# Patient Record
Sex: Female | Born: 1960
Health system: Southern US, Community
[De-identification: ages and names within clinical notes are randomized; demographics above are authoritative.]

## PROBLEM LIST (undated history)

## (undated) DIAGNOSIS — T753XXA Motion sickness, initial encounter: Secondary | ICD-10-CM

## (undated) DIAGNOSIS — H409 Unspecified glaucoma: Secondary | ICD-10-CM

## (undated) DIAGNOSIS — J449 Chronic obstructive pulmonary disease, unspecified: Secondary | ICD-10-CM

## (undated) DIAGNOSIS — J45909 Unspecified asthma, uncomplicated: Secondary | ICD-10-CM

## (undated) DIAGNOSIS — K219 Gastro-esophageal reflux disease without esophagitis: Secondary | ICD-10-CM

## (undated) DIAGNOSIS — F32A Depression, unspecified: Secondary | ICD-10-CM

## (undated) DIAGNOSIS — M1990 Unspecified osteoarthritis, unspecified site: Secondary | ICD-10-CM

## (undated) DIAGNOSIS — I499 Cardiac arrhythmia, unspecified: Secondary | ICD-10-CM

## (undated) DIAGNOSIS — N644 Mastodynia: Secondary | ICD-10-CM

## (undated) DIAGNOSIS — N6452 Nipple discharge: Secondary | ICD-10-CM

## (undated) DIAGNOSIS — R200 Anesthesia of skin: Secondary | ICD-10-CM

## (undated) DIAGNOSIS — M539 Dorsopathy, unspecified: Secondary | ICD-10-CM

## (undated) DIAGNOSIS — J189 Pneumonia, unspecified organism: Secondary | ICD-10-CM

## (undated) DIAGNOSIS — R011 Cardiac murmur, unspecified: Secondary | ICD-10-CM

## (undated) DIAGNOSIS — Z972 Presence of dental prosthetic device (complete) (partial): Secondary | ICD-10-CM

## (undated) DIAGNOSIS — F419 Anxiety disorder, unspecified: Secondary | ICD-10-CM

## (undated) DIAGNOSIS — Z9289 Personal history of other medical treatment: Secondary | ICD-10-CM

## (undated) DIAGNOSIS — I509 Heart failure, unspecified: Secondary | ICD-10-CM

## (undated) DIAGNOSIS — G4730 Sleep apnea, unspecified: Secondary | ICD-10-CM

## (undated) DIAGNOSIS — J410 Simple chronic bronchitis: Secondary | ICD-10-CM

## (undated) DIAGNOSIS — I10 Essential (primary) hypertension: Secondary | ICD-10-CM

## (undated) DIAGNOSIS — K635 Polyp of colon: Secondary | ICD-10-CM

## (undated) DIAGNOSIS — F329 Major depressive disorder, single episode, unspecified: Secondary | ICD-10-CM

## (undated) DIAGNOSIS — R112 Nausea with vomiting, unspecified: Secondary | ICD-10-CM

## (undated) DIAGNOSIS — D649 Anemia, unspecified: Secondary | ICD-10-CM

## (undated) DIAGNOSIS — G629 Polyneuropathy, unspecified: Secondary | ICD-10-CM

## (undated) DIAGNOSIS — Z9889 Other specified postprocedural states: Secondary | ICD-10-CM

## (undated) HISTORY — PX: BELPHAROPTOSIS REPAIR: SHX369

## (undated) HISTORY — PX: SPINE SURGERY: SHX786

## (undated) HISTORY — DX: Polyp of colon: K63.5

## (undated) HISTORY — PX: BACK SURGERY: SHX140

## (undated) HISTORY — PX: CARDIAC CATHETERIZATION: SHX172

## (undated) HISTORY — PX: DILATION AND CURETTAGE OF UTERUS: SHX78

## (undated) HISTORY — PX: TUBAL LIGATION: SHX77

## (undated) HISTORY — DX: Depression, unspecified: F32.A

## (undated) HISTORY — DX: Personal history of other medical treatment: Z92.89

## (undated) HISTORY — PX: ABDOMINAL HYSTERECTOMY: SHX81

## (undated) HISTORY — DX: Anemia, unspecified: D64.9

## (undated) HISTORY — PX: ECTOPIC PREGNANCY SURGERY: SHX613

## (undated) HISTORY — DX: Mastodynia: N64.4

## (undated) HISTORY — DX: Unspecified osteoarthritis, unspecified site: M19.90

## (undated) HISTORY — DX: Major depressive disorder, single episode, unspecified: F32.9

## (undated) HISTORY — DX: Nipple discharge: N64.52

## (undated) HISTORY — DX: Gastro-esophageal reflux disease without esophagitis: K21.9

---

## 2001-06-08 HISTORY — PX: NECK SURGERY: SHX720

## 2007-07-28 ENCOUNTER — Ambulatory Visit: Payer: Self-pay | Admitting: Family Medicine

## 2008-12-20 ENCOUNTER — Emergency Department: Payer: Self-pay | Admitting: Internal Medicine

## 2009-09-11 ENCOUNTER — Emergency Department: Payer: Self-pay | Admitting: Internal Medicine

## 2011-01-26 ENCOUNTER — Emergency Department: Payer: Self-pay | Admitting: Emergency Medicine

## 2012-06-08 HISTORY — PX: ABDOMINAL HYSTERECTOMY: SHX81

## 2012-06-08 HISTORY — PX: TOTAL ABDOMINAL HYSTERECTOMY W/ BILATERAL SALPINGOOPHORECTOMY: SHX83

## 2012-10-19 ENCOUNTER — Ambulatory Visit: Payer: Self-pay | Admitting: Family Medicine

## 2012-11-08 ENCOUNTER — Ambulatory Visit: Payer: Self-pay | Admitting: Family Medicine

## 2012-11-21 ENCOUNTER — Ambulatory Visit: Payer: Self-pay | Admitting: Family Medicine

## 2013-02-08 ENCOUNTER — Ambulatory Visit: Payer: Self-pay | Admitting: Obstetrics and Gynecology

## 2013-02-08 LAB — COMPREHENSIVE METABOLIC PANEL
Albumin: 4.1 g/dL (ref 3.4–5.0)
Alkaline Phosphatase: 53 U/L (ref 50–136)
Anion Gap: 4 — ABNORMAL LOW (ref 7–16)
BUN: 14 mg/dL (ref 7–18)
Calcium, Total: 9.4 mg/dL (ref 8.5–10.1)
Chloride: 106 mmol/L (ref 98–107)
Creatinine: 0.93 mg/dL (ref 0.60–1.30)
EGFR (Non-African Amer.): >60
Glucose: 97 mg/dL (ref 65–99)
Potassium: 4.1 mmol/L (ref 3.5–5.1)
SGOT(AST): 16 U/L (ref 15–37)
SGPT (ALT): 19 U/L (ref 12–78)
Sodium: 138 mmol/L (ref 136–145)
Total Protein: 7.1 g/dL (ref 6.4–8.2)

## 2013-02-08 LAB — CBC
HCT: 43.3 % (ref 35.0–47.0)
MCH: 30.4 pg (ref 26.0–34.0)
MCHC: 34.2 g/dL (ref 32.0–36.0)
Platelet: 223 10*3/uL (ref 150–440)

## 2013-02-14 ENCOUNTER — Ambulatory Visit: Payer: Self-pay | Admitting: Obstetrics and Gynecology

## 2013-02-15 LAB — BASIC METABOLIC PANEL
Anion Gap: 6 — ABNORMAL LOW (ref 7–16)
BUN: 4 mg/dL — ABNORMAL LOW (ref 7–18)
Calcium, Total: 8.4 mg/dL — ABNORMAL LOW (ref 8.5–10.1)
Chloride: 107 mmol/L (ref 98–107)
Co2: 27 mmol/L (ref 21–32)
Creatinine: 0.84 mg/dL (ref 0.60–1.30)
Glucose: 89 mg/dL (ref 65–99)
Sodium: 140 mmol/L (ref 136–145)

## 2013-02-15 LAB — CBC
HCT: 37.1 % (ref 35.0–47.0)
MCH: 30.4 pg (ref 26.0–34.0)
MCHC: 33.9 g/dL (ref 32.0–36.0)
Platelet: 202 10*3/uL (ref 150–440)
WBC: 7.5 10*3/uL (ref 3.6–11.0)

## 2013-02-15 LAB — MAGNESIUM: Magnesium: 1.9 mg/dL

## 2013-02-16 LAB — PATHOLOGY REPORT

## 2013-07-26 ENCOUNTER — Ambulatory Visit: Payer: Self-pay | Admitting: Family Medicine

## 2013-08-02 ENCOUNTER — Ambulatory Visit: Payer: Self-pay

## 2013-08-09 ENCOUNTER — Encounter: Payer: Self-pay | Admitting: General Surgery

## 2013-08-21 ENCOUNTER — Encounter: Payer: Self-pay | Admitting: General Surgery

## 2013-08-21 ENCOUNTER — Ambulatory Visit (INDEPENDENT_AMBULATORY_CARE_PROVIDER_SITE_OTHER): Payer: PRIVATE HEALTH INSURANCE | Admitting: General Surgery

## 2013-08-21 VITALS — BP 130/84 | HR 78 | Resp 12 | Ht 65.0 in | Wt 214.0 lb

## 2013-08-21 DIAGNOSIS — N63 Unspecified lump in unspecified breast: Secondary | ICD-10-CM

## 2013-08-21 NOTE — Patient Instructions (Addendum)
The patient is aware to call back for any questions or concerns. Patient to return as needed. Advised on importance of monthly self exam and yearly mammogram and exam.

## 2013-08-21 NOTE — Progress Notes (Signed)
Patient ID: Hannah Kaiser, female   DOB: [birth year], 53 y.o.   MRN: [id number]  Chief Complaint  Patient presents with  . Other    Abnormal mammogram    HPI Hannah Kaiser is a 53 y.o. female who presents for a breast evaluation.  The most recent mammogram was done on 08/02/13. Patient does not perform regular self breast checks but does get regular mammograms done. She states she has bilateral nipple discharge that has been present for approximately 20 years. She states the right breast is worse than the left. The discharge color is greenish. She denies noticing any lumps or bumps in her breasts. No known family history of breast problems. On recent evaluation by Ms. Quentin Ore patient was noted to have questionable lump at 6 and 9 o'clock areola region right breast.    HPI  Past Medical History  Diagnosis Date  . GERD (gastroesophageal reflux disease)   . Colon polyps   . Arthritis   . History of blood transfusion   . Breast pain   . Nipple discharge   . Depression     Past Surgical History  Procedure Laterality Date  . Abdominal hysterectomy  2014  . Spine surgery  1989, 1998  . Neck surgery  2003    Family History  Problem Relation Age of Onset  . Cancer Brother     melanoma  . Cancer Maternal Grandfather     prostate  . Cancer Maternal Uncle     lung    Social History History  Substance Use Topics  . Smoking status: Current Every Day Smoker -- 1.00 packs/day for 37 years  . Smokeless tobacco: Never Used  . Alcohol Use: No    Allergies  Allergen Reactions  . Erythromycin Rash    Current Outpatient Prescriptions  Medication Sig Dispense Refill  . FLUoxetine HCl (PROZAC PO) Take 80 mg by mouth daily.      . hydrochlorothiazide (HYDRODIURIL) 25 MG tablet Take 25 mg by mouth daily.       No current facility-administered medications for this visit.    Review of Systems Review of Systems  Constitutional: Negative.   Respiratory: Negative.   Cardiovascular:  Negative.     Blood pressure 130/84, pulse 78, resp. rate 12, height 5\' 5"  (1.651 m), weight 214 lb (97.07 kg).  Physical Exam Physical Exam  Constitutional: She is oriented to person, place, and time. She appears well-developed and well-nourished.  Eyes: Conjunctivae are normal. No scleral icterus.  Neck: Neck supple. No thyromegaly present.  Cardiovascular: Normal rate, regular rhythm and normal heart sounds.   No murmur heard. Pulmonary/Chest: Effort normal and breath sounds normal. Right breast exhibits nipple discharge (greenish in color). Right breast exhibits no inverted nipple, no mass, no skin change and no tenderness. Left breast exhibits no inverted nipple, no mass, no nipple discharge, no skin change and no tenderness.  Lymphadenopathy:    She has no cervical adenopathy.    She has no axillary adenopathy.  Neurological: She is alert and oriented to person, place, and time.  Skin: Skin is warm and dry.    Data Reviewed Mammogram and ultrasound both normal.   Assessment    No palpable mass in right breast identified today on exam. Imaging was normal. Discharge is chronic and benign.     Plan    May return to routine yearly mammogram and breast exam.        SANKAR,SEEPLAPUTHUR G 08/22/2013, 6:54 AM

## 2013-08-22 ENCOUNTER — Encounter: Payer: Self-pay | Admitting: General Surgery

## 2013-08-30 ENCOUNTER — Emergency Department: Payer: Self-pay | Admitting: Emergency Medicine

## 2013-08-30 LAB — BASIC METABOLIC PANEL
ANION GAP: 4 — AB (ref 7–16)
BUN: 14 mg/dL (ref 7–18)
CHLORIDE: 105 mmol/L (ref 98–107)
Calcium, Total: 9.1 mg/dL (ref 8.5–10.1)
Co2: 27 mmol/L (ref 21–32)
Creatinine: 0.98 mg/dL (ref 0.60–1.30)
EGFR (African American): >60
EGFR (Non-African Amer.): >60
GLUCOSE: 89 mg/dL (ref 65–99)
Osmolality: 272 (ref 275–301)
Potassium: 3.7 mmol/L (ref 3.5–5.1)
Sodium: 136 mmol/L (ref 136–145)

## 2013-08-30 LAB — CBC
HCT: 44.2 % (ref 35.0–47.0)
HGB: 14.6 g/dL (ref 12.0–16.0)
MCH: 29.8 pg (ref 26.0–34.0)
MCHC: 33.1 g/dL (ref 32.0–36.0)
MCV: 90 fL (ref 80–100)
Platelet: 214 10*3/uL (ref 150–440)
RBC: 4.92 10*6/uL (ref 3.80–5.20)
RDW: 12.7 % (ref 11.5–14.5)
WBC: 6.1 10*3/uL (ref 3.6–11.0)

## 2013-08-30 LAB — TROPONIN I: Troponin-I: <0.02 ng/mL

## 2013-08-31 LAB — HEPATIC FUNCTION PANEL A (ARMC)
ALT: 21 U/L (ref 12–78)
AST: 14 U/L — AB (ref 15–37)
Albumin: 4.2 g/dL (ref 3.4–5.0)
Alkaline Phosphatase: 53 U/L
Bilirubin, Direct: <0.1 mg/dL (ref 0.00–0.20)
Bilirubin,Total: 0.5 mg/dL (ref 0.2–1.0)
Total Protein: 7.5 g/dL (ref 6.4–8.2)

## 2013-08-31 LAB — URINALYSIS, COMPLETE
BACTERIA: NONE SEEN
BLOOD: NEGATIVE
Bilirubin,UR: NEGATIVE
Glucose,UR: NEGATIVE mg/dL (ref 0–75)
Hyaline Cast: 2
KETONE: NEGATIVE
LEUKOCYTE ESTERASE: NEGATIVE
NITRITE: NEGATIVE
Ph: 6 (ref 4.5–8.0)
Protein: NEGATIVE
RBC,UR: <1 /HPF (ref 0–5)
Specific Gravity: 1.013 (ref 1.003–1.030)
Squamous Epithelial: 2
WBC UR: 1 /HPF (ref 0–5)

## 2013-08-31 LAB — LIPASE, BLOOD: Lipase: 168 U/L (ref 73–393)

## 2013-08-31 LAB — TROPONIN I

## 2013-11-21 ENCOUNTER — Ambulatory Visit: Payer: Self-pay

## 2014-04-09 ENCOUNTER — Encounter: Payer: Self-pay | Admitting: General Surgery

## 2014-08-16 ENCOUNTER — Emergency Department: Payer: Self-pay | Admitting: Emergency Medicine

## 2014-09-26 ENCOUNTER — Emergency Department
Admit: 2014-09-26 | Disposition: A | Discharge status: Home / Self Care | Payer: Self-pay | Admitting: Emergency Medicine

## 2014-09-28 NOTE — Op Note (Signed)
PATIENT NAMEOLINDA, Hannah Kaiser MR#:  [id number] DATE OF BIRTH:  1961-01-21  DATE OF PROCEDURE:  02/14/2013  PREOPERATIVE DIAGNOSIS: Abnormal uterine bleeding.   POSTOPERATIVE DIAGNOSIS: Abnormal uterine bleeding.   PROCEDURES: 1.  Robot-assisted total laparoscopic hysterectomy.  2.  Lysis of adhesions.  3.  Cystoscopy.   SURGEON: Prentice Docker, MD   ASSISTANT SURGEON: Malachy Mood, MD  ESTIMATED BLOOD LOSS: 50 mL.  OPERATIVE FLUIDS: 1500 mL crystalloid.   COMPLICATIONS: None.   FINDINGS: 1.  Adhesions of large bowel to the uterus.  2.  Left ovary adherent to left uterine wall.  3.  Omental adhesions to anterior abdominal wall.  4.  Normal appearing uterus without fallopian tubes.  5.  Efflux from bilateral ureteral orifices on cystoscopy.   SPECIMENS: Uterus and cervix.  DRAINS: Foley catheter to urometer to gravity.   CONDITION AT THE END OF PROCEDURE: Stable.   PROCEDURE IN DETAIL: The patient was taken to the operating room where general anesthesia was administered and found to be adequate. She was placed in the dorsal supine lithotomy position with great care taken to secure her shoulders given the need for steep Trendelenburg during the procedure and care to spare any nerve damage from positioning during surgery. She was prepped and draped in the usual sterile fashion. After timeout was called, a sterile speculum was placed in the vagina and a single-tooth tenaculum was used to grasp the anterior lip of the cervix. The uterus was sounded to a depth of 7 cm. A large V-care uterine manipulator with Koh ring device was placed in the uterus. This was after removal of the single-tooth tenaculum. The Koh ring was placed flush against the fornices of the vagina. A Foley catheter was then placed inside the bladder throughout the surgery.   Attention was turned to the abdomen where a left upper quadrant 8 mm incision was made at approximately 2 cm from the costal margin at the mid  clavicular line or Palmer's point. A long 5 mm trocar was advanced under direct camera visualization using an Optiview trocar and entry was gained into the abdomen. Entry was verified using opening pressure and then the abdomen was insufflated with CO2. The camera was reintroduced through the trocar to verify entry. After local anesthetic was administered, an 8 mm infraumbilical incision was made and an 8 mm camera port trocar was placed at the umbilicus and 2 flank left lower quadrant 8 mm trocars were placed under direct intra-abdominal camera visualization. The abdomen and pelvis were surveyed with the above-noted findings.   The robot was then docked to the patient's right side and the robot camera was placed without difficulty along with monopolar scissors through the right lower quadrant port and a bipolar fenestrated forceps through the left lower quadrant port. The Palmer's point port was used as the assistant port. At this point, attention was turned to lysing the adherence of the bowel to the left uterine fundus which was accomplished delicately but without difficulty. Next, the right round ligament was transected using the monopolar scissors and the broad ligament was opened anteriorly to the level of the internal os and a bladder flap was created. Next, the broad ligament was opened posteriorly and the utero-ovarian ligament was identified and cauterized using the bipolar fenestrated forceps and then transected using the scissors to join the transection of the round ligament. Next, the broad ligament was opened to the level of the internal os posteriorly. The uterine artery was then skeletonized and then  cauterized using bipolar electrocautery and transected using the monopolar scissors.   Attention was turned to the left adnexa where the round ligament was then similarly transected and the broad ligament was opened to the level of the internal os. Posteriorly the left ovary was dissected away from  the left uterine sidewall and then the utero-ovarian ligament was identified and bipolar electrode cautery was used to cauterize the vessels and the transection was accomplished using monopolar scissors. The broad ligament was opened to the level of the internal os posteriorly next. The left uterine artery was similarly skeletonized, cauterized and transected, and a bladder flap was created. There was some adherence of bladder to the lower uterine segment that had to be carefully dissected. This required backfilling of the bladder of 120 mL of fluid to ensure the boundaries of the bladder were clear given the difficult distinction with the adhesions that were present. Once the bladder flap was successfully created, the uterus was then liberated by creating a colpotomy and following the Koh rings circumferentially around the base of the uterus. The uterus was then removed through the vagina.   The vaginal cuff was closed using #0 Vicryl on a CT-2 in a running fashion. Hemostasis was then verified, and the pelvis was copiously irrigated and cleared of all fluids. Hemostasis was noted. At this point, the robot was undocked and the abdomen was desufflated of CO2. All port sites were then closed using a subcuticular stitch and the skin was reapproximated using Dermabond.   Cystoscopy was undertaken to verify function of the ureters. Both ureters were visualized and efflux was noted from both ureteral orifices. The bladder was also inspected with no noted deformities or injuries. The Foley catheter was then replaced in the bladder and terminated the procedure.   The patient tolerated the procedure well. Sponge, lap and needle counts were correct x 2. For antibiotic prophylaxis, the patient received 2 grams of Ancef prior to the skin incision and received and additional 1 gram of Ancef during the case given that about 3 hours had expired during the case. For VTE prophylaxis, she was wearing pneumatic compression  stockings which were in place and operating throughout the entire procedure. She was awakened in the operating room and taken to the recovery area in stable condition.  ____________________________ Will Bonnet, MD sdj:sb D: 02/14/2013 14:41:22 ET T: 02/14/2013 15:05:48 ET JOB#: [id number]  cc: Will Bonnet, MD, <Dictator>  Will Bonnet MD ELECTRONICALLY SIGNED 03/28/2013 15:03

## 2014-12-16 ENCOUNTER — Emergency Department: Payer: Self-pay

## 2014-12-16 ENCOUNTER — Emergency Department
Admission: EM | Admit: 2014-12-16 | Discharge: 2014-12-16 | Disposition: A | Discharge status: Home / Self Care | Payer: Self-pay | Attending: Emergency Medicine | Admitting: Emergency Medicine

## 2014-12-16 DIAGNOSIS — Z720 Tobacco use: Secondary | ICD-10-CM | POA: Insufficient documentation

## 2014-12-16 DIAGNOSIS — I10 Essential (primary) hypertension: Secondary | ICD-10-CM | POA: Insufficient documentation

## 2014-12-16 DIAGNOSIS — Z79899 Other long term (current) drug therapy: Secondary | ICD-10-CM | POA: Insufficient documentation

## 2014-12-16 DIAGNOSIS — R609 Edema, unspecified: Secondary | ICD-10-CM

## 2014-12-16 DIAGNOSIS — R062 Wheezing: Secondary | ICD-10-CM | POA: Insufficient documentation

## 2014-12-16 DIAGNOSIS — R600 Localized edema: Secondary | ICD-10-CM | POA: Insufficient documentation

## 2014-12-16 LAB — BASIC METABOLIC PANEL
ANION GAP: 7 (ref 5–15)
BUN: 12 mg/dL (ref 6–20)
CALCIUM: 9.1 mg/dL (ref 8.9–10.3)
CHLORIDE: 105 mmol/L (ref 101–111)
CO2: 27 mmol/L (ref 22–32)
CREATININE: 0.92 mg/dL (ref 0.44–1.00)
GFR calc non Af Amer: >60 mL/min (ref >60)
GLUCOSE: 120 mg/dL — AB (ref 65–99)
POTASSIUM: 3.6 mmol/L (ref 3.5–5.1)
SODIUM: 139 mmol/L (ref 135–145)

## 2014-12-16 LAB — CBC
HCT: 39.5 % (ref 35.0–47.0)
Hemoglobin: 13.1 g/dL (ref 12.0–16.0)
MCH: 30.1 pg (ref 26.0–34.0)
MCHC: 33.2 g/dL (ref 32.0–36.0)
MCV: 90.7 fL (ref 80.0–100.0)
PLATELETS: 196 10*3/uL (ref 150–440)
RBC: 4.36 MIL/uL (ref 3.80–5.20)
RDW: 13.5 % (ref 11.5–14.5)
WBC: 5.5 10*3/uL (ref 3.6–11.0)

## 2014-12-16 LAB — TROPONIN I: Troponin I: <0.03 ng/mL (ref <0.031)

## 2014-12-16 LAB — BRAIN NATRIURETIC PEPTIDE: B Natriuretic Peptide: 40 pg/mL (ref 0.0–100.0)

## 2014-12-16 MED ORDER — IPRATROPIUM-ALBUTEROL 0.5-2.5 (3) MG/3ML IN SOLN
RESPIRATORY_TRACT | Status: AC
  Administered 2014-12-16: 3 mL via RESPIRATORY_TRACT
  Filled 2014-12-16: qty 3

## 2014-12-16 MED ORDER — IPRATROPIUM-ALBUTEROL 0.5-2.5 (3) MG/3ML IN SOLN
3.0000 mL | Freq: Once | RESPIRATORY_TRACT | Status: AC
  Administered 2014-12-16: 3 mL via RESPIRATORY_TRACT

## 2014-12-16 MED ORDER — FUROSEMIDE 40 MG PO TABS: 40.0000 mg | ORAL_TABLET | Freq: Every day | ORAL | Status: DC

## 2014-12-16 MED ORDER — POTASSIUM CHLORIDE CRYS ER 20 MEQ PO TBCR
20.0000 meq | EXTENDED_RELEASE_TABLET | Freq: Once | ORAL | Status: AC
  Administered 2014-12-16: 20 meq via ORAL

## 2014-12-16 MED ORDER — POTASSIUM CHLORIDE CRYS ER 20 MEQ PO TBCR
EXTENDED_RELEASE_TABLET | ORAL | Status: AC
  Administered 2014-12-16: 20 meq via ORAL
  Filled 2014-12-16: qty 1

## 2014-12-16 MED ORDER — HYDROCHLOROTHIAZIDE 25 MG PO TABS
ORAL_TABLET | ORAL | Status: AC
Start: 2014-12-16 — End: 2014-12-16
  Administered 2014-12-16: 25 mg via ORAL
  Filled 2014-12-16: qty 1

## 2014-12-16 MED ORDER — POTASSIUM CHLORIDE ER 10 MEQ PO TBCR: 20.0000 meq | EXTENDED_RELEASE_TABLET | Freq: Every day | ORAL | Status: DC

## 2014-12-16 MED ORDER — FUROSEMIDE 40 MG PO TABS
40.0000 mg | ORAL_TABLET | Freq: Once | ORAL | Status: AC
  Administered 2014-12-16: 40 mg via ORAL

## 2014-12-16 MED ORDER — HYDROCHLOROTHIAZIDE 25 MG PO TABS
25.0000 mg | ORAL_TABLET | Freq: Every day | ORAL | Status: DC
  Administered 2014-12-16: 25 mg via ORAL

## 2014-12-16 MED ORDER — FUROSEMIDE 40 MG PO TABS
ORAL_TABLET | ORAL | Status: AC
Start: 2014-12-16 — End: 2014-12-16
  Administered 2014-12-16: 40 mg via ORAL
  Filled 2014-12-16: qty 1

## 2014-12-16 NOTE — ED Provider Notes (Addendum)
[facility] Emergency Department Provider Note  ____________________________________________  Time seen: Approximately 4:27 PM  I have reviewed the triage vital signs and the nursing notes.   HISTORY  Chief Complaint Leg Swelling and Shortness of Breath    HPI Hannah Kaiser is a 54 y.o. female history of hypertension, depression. She presents today stating that she has no sick increased swelling in both legs over the last 1 month. In particular she reports that she's gained about 9 pounds in the last week. She does report that she has swelling that started in her ankles and now worked its way up to about the level of her knees and is becoming uncomfortable feeling a "stretched" feeling in both legs. She denies any chest pain. She does report that she has some mild shortness of breath, but this is not uncommon for her given her smoking history. She states she is also supposed to be on blood pressure medicine including atenolol and hydrochlorothiazide, but she does not take these on a regular basis and has not taken them recently.  She denies any fevers or productive cough. No abdominal pain or swelling.   Past Medical History  Diagnosis Date  . GERD (gastroesophageal reflux disease)   . Colon polyps   . Arthritis   . History of blood transfusion   . Breast pain   . Nipple discharge   . Depression     There are no active problems to display for this patient.   Past Surgical History  Procedure Laterality Date  . Abdominal hysterectomy  2014  . Spine surgery  1989, 1998  . Neck surgery  2003    Current Outpatient Rx  Name  Route  Sig  Dispense  Refill  . FLUoxetine HCl (PROZAC PO)   Oral   Take 80 mg by mouth daily.         . hydrochlorothiazide (HYDRODIURIL) 25 MG tablet   Oral   Take 25 mg by mouth daily.           Allergies Erythromycin  Family History  Problem Relation Age of Onset  . Cancer Brother     melanoma  . Cancer  Maternal Grandfather     prostate  . Cancer Maternal Uncle     lung    Social History History  Substance Use Topics  . Smoking status: Current Every Day Smoker -- 1.00 packs/day for 37 years  . Smokeless tobacco: Never Used  . Alcohol Use: No    Review of Systems Constitutional: No fever/chills Eyes: No visual changes. ENT: No sore throat. Cardiovascular: Denies chest pain. Respiratory: Denies shortness of breath, except with some exertion. Gastrointestinal: No abdominal pain.  No nausea, no vomiting.  No diarrhea.  No constipation. Genitourinary: Negative for dysuria. Musculoskeletal: Negative for back pain. Skin: Negative for rash, but is a swelling and lower legs. Neurological: Negative for headaches, focal weakness or numbness.  10-point ROS otherwise negative.  ____________________________________________   PHYSICAL EXAM:  VITAL SIGNS: ED Triage Vitals  Enc Vitals Group     BP 12/16/14 1535 201/103 mmHg     Pulse Rate 12/16/14 1535 99     Resp 12/16/14 1535 16     Temp 12/16/14 1535 98.3 F (36.8 C)     Temp Source 12/16/14 1535 Oral     SpO2 12/16/14 1535 99 %     Weight 12/16/14 1535 245 lb (111.131 kg)     Height 12/16/14 1535 5\' 5"  (1.651 m)  Head Cir --      Peak Flow --      Pain Score --      Pain Loc --      Pain Edu? --      Excl. in [place]? --     Constitutional: Alert and oriented. Well appearing and in no acute distress. Eyes: Conjunctivae are normal. PERRL. EOMI. Head: Atraumatic. Nose: No congestion/rhinnorhea. Mouth/Throat: Mucous membranes are moist.  Oropharynx non-erythematous. Neck: No stridor.   Cardiovascular: Normal rate, regular rhythm. Grossly normal heart sounds.  Good peripheral circulation. Respiratory: Normal respiratory effort.  No retractions. Lungs CTAB, except for some occasional end expiratory wheezes which are very scant. Patient speaks in full sentences and is in no respiratory distress. No accessory muscle  use. Gastrointestinal: Soft and nontender. No distention. No abdominal bruits. No CVA tenderness. Musculoskeletal: No lower extremity tenderness. Patient has 2+ lower extremity edema bilaterally. No rash or erythema.  No joint effusions. Neurologic:  Normal speech and language. No gross focal neurologic deficits are appreciated. Speech is normal.  Skin:  Skin is warm, dry and intact. No rash noted. Psychiatric: Mood and affect are normal. Speech and behavior are normal.  ____________________________________________   LABS (all labs ordered are listed, but only abnormal results are displayed)  Labs Reviewed  BASIC METABOLIC PANEL - Abnormal; Notable for the following:    Glucose, Bld 120 (*)    All other components within normal limits  BRAIN NATRIURETIC PEPTIDE  CBC  TROPONIN I   ____________________________________________  EKG  ED ECG REPORT I, QUALE, MARK, the attending physician, personally viewed and interpreted this ECG.  Date: 12/16/2014 EKG Time: 1545 Rate: 85 Rhythm: normal sinus rhythm QRS Axis: normal Intervals: normal ST/T Wave abnormalities: normal Conduction Disutrbances: Inferior Q waves Narrative Interpretation: Normal sinus rhythm. No acute ischemic changes. Q waves noted in inferior distribution. Somewhat low voltage.  Present in March 2015 reveals no acute abnormalities, except for slight decreased voltage noted in lateral precordial leads. ____________________________________________  RADIOLOGY  CXR clear  US Venous Img Lower Bilateral (Final result) Result time: 12/16/14 18:00:37   Final result by Rad Results In Interface (12/16/14 18:00:37)   Narrative:   CLINICAL DATA: Bilateral lower extremity edema for the past 3 weeks with pain.  EXAM: BILATERAL LOWER EXTREMITY VENOUS DOPPLER ULTRASOUND  TECHNIQUE: Gray-scale sonography with graded compression, as well as color Doppler and duplex ultrasound were performed to evaluate the  lower extremity deep venous systems from the level of the common femoral vein and including the common femoral, femoral, profunda femoral, popliteal and calf veins including the posterior tibial, peroneal and gastrocnemius veins when visible. The superficial great saphenous vein was also interrogated. Spectral Doppler was utilized to evaluate flow at rest and with distal augmentation maneuvers in the common femoral, femoral and popliteal veins.  COMPARISON: Report from 03/17/2000  FINDINGS: RIGHT LOWER EXTREMITY  Common Femoral Vein: No evidence of thrombus. Normal compressibility, respiratory phasicity and response to augmentation.  Saphenofemoral Junction: No evidence of thrombus. Normal compressibility and flow on color Doppler imaging.  Profunda Femoral Vein: No evidence of thrombus. Normal compressibility and flow on color Doppler imaging.  Femoral Vein: No evidence of thrombus. Normal compressibility, respiratory phasicity and response to augmentation.  Popliteal Vein: No evidence of thrombus. Normal compressibility, respiratory phasicity and response to augmentation.  Calf Veins: No evidence of thrombus. Normal compressibility and flow on color Doppler imaging.  Superficial Great Saphenous Vein: No evidence of thrombus. Normal compressibility and flow on color Doppler  imaging.  Venous Reflux: None.  Other Findings: None.  LEFT LOWER EXTREMITY  Common Femoral Vein: No evidence of thrombus. Normal compressibility, respiratory phasicity and response to augmentation.  Saphenofemoral Junction: No evidence of thrombus. Normal compressibility and flow on color Doppler imaging.  Profunda Femoral Vein: No evidence of thrombus. Normal compressibility and flow on color Doppler imaging.  Femoral Vein: No evidence of thrombus. Normal compressibility, respiratory phasicity and response to augmentation.  Popliteal Vein: No evidence of thrombus. Normal  compressibility, respiratory phasicity and response to augmentation.  Calf Veins: No evidence of thrombus. Normal compressibility and flow on color Doppler imaging.  Superficial Great Saphenous Vein: No evidence of thrombus. Normal compressibility and flow on color Doppler imaging.  Venous Reflux: None.  Other Findings: Left sided Baker's cyst, 4.8 by 1.9 by 4.8 cm.  IMPRESSION: 1. No evidence of deep venous thrombosis. 2. Moderate-sized left Baker's cyst.    ____________________________________________   PROCEDURES  Procedure(s) performed: None  Critical Care performed: No  ____________________________________________   INITIAL IMPRESSION / ASSESSMENT AND PLAN / ED COURSE  Pertinent labs & imaging results that were available during my care of the patient were reviewed by me and considered in my medical decision making (see chart for details).  Patient presents with increasing lower extremity edema over the last 1 month. This is associated with some mild shortness of breath for the last couple of weeks. She is in no distress. She does indeed have moderate lower extremity edema. She is also quite hypertensive, but is also noncompliant with her medications for hypertension treatment. I believe there is a possibility of an element of heart failure, though she has no evidence of increased work of breathing and her oxygen saturation is normal at this time. Chest x-ray is clear.  We'll start the patient on Lasix, place her back on her normal hydrochlorothiazide and we will add Lasix. I have paged Dr. Chancy Milroy, patient's cardiologist to discuss further treatment recommendations.  Care and disposition assigned Dr. Karma Greaser will follow-up on venous Doppler study and reevaluation of patient's blood pressure prior to discharge to follow-up with her doctor.  ----------------------------------------- 6:05 PM on 12/16/2014 -----------------------------------------  Patient's primary  cardiologist not available at this time, did not answer return page. I have discussed with Dr. Rayann Heman of cardiology regarding the patient's symptomatology, vital signs, clinical presentation and laboratory analysis. Dr. Rayann Heman recommends treatment with Lasix and we can place her on once daily for 5 days. She is to follow-up with Dr. Chancy Milroy by calling his office to set up follow-up on Monday, if unable to reach them and she can follow up with [facility] cardiology and Dr. Rayann Heman.  ----------------------------------------- 6:10 PM on 12/16/2014 -----------------------------------------  Patient's wheezing is improved after a single nebulizer treatment and she is breathing normally. No wheezing. Patient reports she does occasionally use an inhaler for similar symptoms, and I do not believe she is currently exhibiting signs of an acute COPD exacerbation.  I will based on evaluation of the patient is suffering from edema likely some mild volume overload, but she is quite stable hemodynamically without distress. I believe she is appropriate for outpatient follow-up. I did discuss very close return precautions including any increasing trouble breathing, any chest pain, fevers, productive cough. She is quite agreeable with the plan to take Lasix as well as resume her home blood pressure medications.  ----------------------------------------- 6:20 PM on 12/16/2014 -----------------------------------------  Patient's blood pressure improved 181/92. She is asymptomatic at this time and lungs are clear. Duplex is negative  for DVT. No discharge the patient home with a short course of Lasix and potassium. She is to follow-up with Dr. Chancy Milroy Monday, and will call Dr. Jackalyn Lombard office if unable to get follow Dr. Chancy Milroy within the next 1-2 days.   ____________________________________________   FINAL CLINICAL IMPRESSION(S) / ED DIAGNOSES  Final diagnoses:  Edema, peripheral      Delman Kitten, MD 12/16/14  1821  Patient's results and blood pressure have improved. I will discharge the patient myself, Dr. Karma Greaser did not participate in the care of this patient.  Delman Kitten, MD 12/16/14 [phone]

## 2014-12-16 NOTE — Discharge Instructions (Signed)
Peripheral Edema  Please make sure to restart your blood pressure medicines. In addition, call Dr. Marella Bile cardiology office tomorrow morning to set up follow-up within 1-2 days for reevaluation.  It is very important that you return to the emergency room right away should you begin to experience increased shortness of breath, chest pain, trouble breathing, or other new concerns or symptoms arise.  You have swelling in your legs (peripheral edema). This swelling is due to excess accumulation of salt and water in your body. Edema may be a sign of heart, kidney or liver disease, or a side effect of a medication. It may also be due to problems in the leg veins. Elevating your legs and using special support stockings may be very helpful, if the cause of the swelling is due to poor venous circulation. Avoid long periods of standing, whatever the cause. Treatment of edema depends on identifying the cause. Chips, pretzels, pickles and other salty foods should be avoided. Restricting salt in your diet is almost always needed. Water pills (diuretics) are often used to remove the excess salt and water from your body via urine. These medicines prevent the kidney from reabsorbing sodium. This increases urine flow. Diuretic treatment may also result in lowering of potassium levels in your body. Potassium supplements may be needed if you have to use diuretics daily. Daily weights can help you keep track of your progress in clearing your edema. You should call your caregiver for follow up care as recommended. SEEK IMMEDIATE MEDICAL CARE IF:   You have increased swelling, pain, redness, or heat in your legs.  You develop shortness of breath, especially when lying down.  You develop chest or abdominal pain, weakness, or fainting.  You have a fever. Document Released: 07/02/2004 Document Revised: 08/17/2011 Document Reviewed: 06/12/2009 [facility] Patient Information 2015 [place], [place]. This information is not  intended to replace advice given to you by your health care provider. Make sure you discuss any questions you have with your health care provider.

## 2014-12-16 NOTE — ED Notes (Signed)
Pt from home via POV c/o bilateral leg swelling and SOB gradually over last few weeks. States gained 9 pounds over last few days with increase in severity in swelling.

## 2015-02-19 ENCOUNTER — Other Ambulatory Visit: Payer: Self-pay | Admitting: Family Medicine

## 2015-03-13 ENCOUNTER — Encounter: Payer: Self-pay | Admitting: Family Medicine

## 2015-03-13 ENCOUNTER — Ambulatory Visit (INDEPENDENT_AMBULATORY_CARE_PROVIDER_SITE_OTHER): Payer: Self-pay | Admitting: Family Medicine

## 2015-03-13 VITALS — BP 189/109 | HR 73 | Temp 98.9°F | Ht 63.6 in | Wt 244.7 lb

## 2015-03-13 DIAGNOSIS — R8299 Other abnormal findings in urine: Secondary | ICD-10-CM

## 2015-03-13 DIAGNOSIS — R1012 Left upper quadrant pain: Secondary | ICD-10-CM

## 2015-03-13 DIAGNOSIS — I10 Essential (primary) hypertension: Secondary | ICD-10-CM

## 2015-03-13 DIAGNOSIS — I129 Hypertensive chronic kidney disease with stage 1 through stage 4 chronic kidney disease, or unspecified chronic kidney disease: Secondary | ICD-10-CM

## 2015-03-13 DIAGNOSIS — Z23 Encounter for immunization: Secondary | ICD-10-CM

## 2015-03-13 DIAGNOSIS — A048 Other specified bacterial intestinal infections: Secondary | ICD-10-CM

## 2015-03-13 DIAGNOSIS — R82998 Other abnormal findings in urine: Secondary | ICD-10-CM | POA: Insufficient documentation

## 2015-03-13 DIAGNOSIS — Z113 Encounter for screening for infections with a predominantly sexual mode of transmission: Secondary | ICD-10-CM

## 2015-03-13 DIAGNOSIS — B9681 Helicobacter pylori [H. pylori] as the cause of diseases classified elsewhere: Secondary | ICD-10-CM

## 2015-03-13 LAB — UA/M W/RFLX CULTURE, ROUTINE
BILIRUBIN UA: NEGATIVE
GLUCOSE, UA: NEGATIVE
Ketones, UA: NEGATIVE
LEUKOCYTES UA: NEGATIVE
Nitrite, UA: NEGATIVE
PH UA: 7.5 (ref 5.0–7.5)
PROTEIN UA: NEGATIVE
RBC, UA: NEGATIVE
Specific Gravity, UA: 1.015 (ref 1.005–1.030)
Urobilinogen, Ur: 0.2 mg/dL (ref 0.2–1.0)

## 2015-03-13 LAB — MICROALBUMIN, URINE WAIVED
CREATININE, URINE WAIVED: 50 mg/dL (ref 10–300)
MICROALB, UR WAIVED: 10 mg/L (ref 0–19)

## 2015-03-13 MED ORDER — LOSARTAN POTASSIUM 50 MG PO TABS: 50.0000 mg | ORAL_TABLET | Freq: Every day | ORAL | Status: DC

## 2015-03-13 MED ORDER — FLUOXETINE HCL 40 MG PO CAPS: 80.0000 mg | ORAL_CAPSULE | Freq: Every day | ORAL | Status: DC

## 2015-03-13 MED ORDER — ATENOLOL 25 MG PO TABS: 25.0000 mg | ORAL_TABLET | Freq: Every day | ORAL | Status: DC

## 2015-03-13 MED ORDER — HYDROCHLOROTHIAZIDE 25 MG PO TABS: 25.0000 mg | ORAL_TABLET | Freq: Every day | ORAL | Status: DC

## 2015-03-13 NOTE — Assessment & Plan Note (Signed)
Under poor control. Concerned about renal function. Cannot tolerate ACE-inhibitors due to severe cough with post-tussive vomiting. Will start losartan 50mg  and coupon given to patient today. Recheck BP in 6 months. Await blood work.

## 2015-03-13 NOTE — Progress Notes (Signed)
BP 189/109 mmHg  Pulse 73  Temp(Src) 98.9 F (37.2 C)  Ht 5' 3.6" (1.615 m)  Wt 244 lb 11.2 oz (110.995 kg)  BMI 42.56 kg/m2  SpO2 99%   Subjective:    Patient ID: Hannah Kaiser, female    DOB: 05-26-1961, 54 y.o.   MRN: [id number]  HPI: Hannah Kaiser is a 54 y.o. female  Chief Complaint  Patient presents with  . Hypertension   HYPERTENSION Hypertension status: uncontrolled  Satisfied with current treatment? no Duration of hypertension: chronic BP monitoring frequency:  not checking BP range: elevated 180s BP medication side effects:  yes Medication compliance: poor compliance Aspirin: no Recurrent headaches: yes Visual changes: yes Palpitations: yes Dyspnea: yes Chest pain: no Lower extremity edema: yes Dizzy/lightheaded: yes  ABDOMINAL PAIN- L upper quadrant  Duration:week Onset: sudden Severity: severe Quality: sharp and stabbing Location:  LUQ  Episode duration: couple of minutes Radiation: yes into her back Frequency: intermittent Alleviating factors: nothing Aggravating factors: push on it Status: worse Treatments attempted: none Fever: no Nausea: yes Vomiting: no Weight loss: no Decreased appetite: no Diarrhea: yes Constipation: no Blood in stool: no Heartburn: yes Jaundice: no Rash: no Dysuria/urinary frequency: no Hematuria: yes History of sexually transmitted disease: no Recurrent NSAID use: yes  URINARY SYMPTOMS Dysuria: no Urinary frequency: yes Urgency: yes Small volume voids: no Symptom severity: none Urinary incontinence: no Foul odor: yes Hematuria: yes- real dark first thing in the AM Abdominal pain: yes Back pain: no Suprapubic pain/pressure: no Flank pain: no Fever:  no Vomiting: no  Relevant past medical, surgical, family and social history reviewed and updated as indicated. Interim medical history since our last visit reviewed. Allergies and medications reviewed and updated.  Review of Systems  Constitutional:  Negative.   Respiratory: Positive for cough, chest tightness and shortness of breath. Negative for apnea, choking and stridor.   Cardiovascular: Positive for palpitations and leg swelling. Negative for chest pain.  Gastrointestinal: Positive for nausea and abdominal pain. Negative for vomiting, diarrhea, constipation, blood in stool, abdominal distention, anal bleeding and rectal pain.  Genitourinary: Positive for urgency and hematuria. Negative for dysuria, frequency, flank pain, decreased urine volume, vaginal bleeding, vaginal discharge, enuresis, difficulty urinating, genital sores, vaginal pain, menstrual problem, pelvic pain and dyspareunia.  Skin: Negative.   Neurological: Positive for dizziness, light-headedness and numbness. Negative for tremors, seizures, syncope, facial asymmetry, speech difficulty and weakness.    Per HPI unless specifically indicated above     Objective:    BP 189/109 mmHg  Pulse 73  Temp(Src) 98.9 F (37.2 C)  Ht 5' 3.6" (1.615 m)  Wt 244 lb 11.2 oz (110.995 kg)  BMI 42.56 kg/m2  SpO2 99%  Wt Readings from Last 3 Encounters:  03/13/15 244 lb 11.2 oz (110.995 kg)  12/16/14 245 lb (111.131 kg)  08/21/13 214 lb (97.07 kg)    Physical Exam  Constitutional: She is oriented to person, place, and time. She appears well-developed and well-nourished. No distress.  HENT:  Head: Normocephalic and atraumatic.  Right Ear: Hearing normal.  Left Ear: Hearing normal.  Nose: Nose normal.  Eyes: Conjunctivae and lids are normal. Right eye exhibits no discharge. Left eye exhibits no discharge. No scleral icterus.  Cardiovascular: Normal rate, regular rhythm, normal heart sounds and intact distal pulses.  Exam reveals no gallop and no friction rub.   No murmur heard. Pulmonary/Chest: Effort normal and breath sounds normal. No respiratory distress. She has no wheezes. She has no rales. She  exhibits no tenderness.  Abdominal: Soft. Bowel sounds are normal. She exhibits  no distension and no mass. There is tenderness. There is no rebound and no guarding.  LUQ  Musculoskeletal: Normal range of motion. She exhibits edema. She exhibits no tenderness.  2+ edema bilaterally  Neurological: She is alert and oriented to person, place, and time.  Skin: Skin is warm, dry and intact. No rash noted. No erythema. No pallor.  Psychiatric: She has a normal mood and affect. Her speech is normal and behavior is normal. Judgment and thought content normal. Cognition and memory are normal.  Nursing note and vitals reviewed.   Results for orders placed or performed during the hospital encounter of 15/83/09  Basic metabolic panel  Result Value Ref Range   Sodium 139 135 - 145 mmol/L   Potassium 3.6 3.5 - 5.1 mmol/L   Chloride 105 101 - 111 mmol/L   CO2 27 22 - 32 mmol/L   Glucose, Bld 120 (H) 65 - 99 mg/dL   BUN 12 6 - 20 mg/dL   Creatinine, Ser 0.92 0.44 - 1.00 mg/dL   Calcium 9.1 8.9 - 10.3 mg/dL   GFR calc non Af Amer >60 >60 mL/min   GFR calc Af Amer >60 >60 mL/min   Anion gap 7 5 - 15  BNP (order ONLY if patient complains of dyspnea/SOB AND you have documented it for THIS visit)  Result Value Ref Range   B Natriuretic Peptide 40.0 0.0 - 100.0 pg/mL  CBC  Result Value Ref Range   WBC 5.5 3.6 - 11.0 K/uL   RBC 4.36 3.80 - 5.20 MIL/uL   Hemoglobin 13.1 12.0 - 16.0 g/dL   HCT 39.5 35.0 - 47.0 %   MCV 90.7 80.0 - 100.0 fL   MCH 30.1 26.0 - 34.0 pg   MCHC 33.2 32.0 - 36.0 g/dL   RDW 13.5 11.5 - 14.5 %   Platelets 196 150 - 440 K/uL  Troponin I  Result Value Ref Range   Troponin I <0.03 <0.031 ng/mL      Assessment & Plan:   Problem List Items Addressed This Visit      Genitourinary   Benign hypertensive renal disease - Primary    Under poor control. Concerned about renal function. Cannot tolerate ACE-inhibitors due to severe cough with post-tussive vomiting. Will start losartan 50mg  and coupon given to patient today. Recheck BP in 6 months. Await blood  work.         Other   Dark urine    UA negative. Await blood work.       Relevant Orders   Microalbumin, Urine Waived   Comprehensive metabolic panel   UA/M w/rflx Culture, Routine    Other Visit Diagnoses    H. pylori infection        Never treated. Will check to see if cleared and if not treat.     Relevant Orders    Helicobacter Pylori Antibody, IGM    Abdominal pain, left upper quadrant        EKG no change from prior. Concerning for H. pylori. Checking labs today. Await results. Continue to monitor.     Relevant Orders    Amylase    Lipase    Helicobacter Pylori Antibody, IGM    EKG 12-Lead    Routine screening for STI (sexually transmitted infection)        Will check for STI today. Await results.     Relevant Orders    Hepatitis,  Acute    GC/chlamydia probe amp, genital    RPR    HIV antibody    HSV(herpes simplex vrs) 1+2 ab-IgG    Immunization due        Flu shot given today.     Relevant Orders    Flu Vaccine QUAD 36+ mos PF IM (Fluarix & Fluzone Quad PF) (Completed)        Follow up plan: Return in about 2 weeks (around 03/27/2015) for BP check.

## 2015-03-13 NOTE — Addendum Note (Signed)
Addended by: Valerie Roys on: 03/13/2015 01:39 PM   Modules accepted: Miquel Dunn

## 2015-03-13 NOTE — Patient Instructions (Signed)
Chronic Kidney Disease °Chronic kidney disease occurs when the kidneys are damaged over a long period. The kidneys are two organs that lie on either side of the spine between the middle of the back and the front of the abdomen. The kidneys: °· Remove wastes and extra water from the blood. °· Produce important hormones. These help keep bones strong, regulate blood pressure, and help create red blood cells. °· Balance the fluids and chemicals in the blood and tissues. °A small amount of kidney damage may not cause problems, but a large amount of damage may make it difficult or impossible for the kidneys to work the way they should. If steps are not taken to slow down the kidney damage or stop it from getting worse, the kidneys may stop working permanently. Most of the time, chronic kidney disease does not go away. However, it can often be controlled, and those with the disease can usually live normal lives. °CAUSES °The most common causes of chronic kidney disease are diabetes and high blood pressure (hypertension). Chronic kidney disease may also be caused by: °· Diseases that cause the kidneys' filters to become inflamed. °· Diseases that affect the immune system. °· Genetic diseases. °· Medicines that damage the kidneys, such as anti-inflammatory medicines. °· Poisoning or exposure to toxic substances. °· A reoccurring kidney or urinary infection. °· A problem with urine flow. This may be caused by: °· Cancer. °· Kidney stones. °· An enlarged prostate in males. °SIGNS AND SYMPTOMS °Because the kidney damage in chronic kidney disease occurs slowly, symptoms develop slowly and may not be obvious until the kidney damage becomes severe. A person may have a kidney disease for years without showing any symptoms. Symptoms can include: °· Swelling (edema) of the legs, ankles, or feet. °· Tiredness (lethargy). °· Nausea or vomiting. °· Confusion. °· Problems with urination, such as: °· Decreased urine  production. °· Frequent urination, especially at night. °· Frequent accidents in children who are potty trained. °· Muscle twitches and cramps. °· Shortness of breath. °· Weakness. °· Persistent itchiness. °· Loss of appetite. °· Metallic taste in the mouth. °· Trouble sleeping. °· Slowed development in children. °· Short stature in children. °DIAGNOSIS °Chronic kidney disease may be detected and diagnosed by tests, including blood, urine, imaging, or kidney biopsy tests. °TREATMENT °Most chronic kidney diseases cannot be cured. Treatment usually involves relieving symptoms and preventing or slowing the progression of the disease. Treatment may include: °· A special diet. You may need to avoid alcohol and foods that are salty and high in potassium. °· Medicines. These may: °· Lower blood pressure. °· Relieve anemia. °· Relieve swelling. °· Protect the bones. °HOME CARE INSTRUCTIONS °· Follow your prescribed diet.  Your health care provider may instruct you to limit daily salt (sodium) and protein intake. °· Take medicines only as directed by your health care provider. Do not take any new medicines (prescription, over-the-counter, or nutritional supplements) unless approved by your health care provider. Many medicines can worsen your kidney damage or need to have the dose adjusted.   °· Quit smoking if you smoke. Talk to your health care provider about a smoking cessation program. °· Keep all follow-up visits as directed by your health care provider. °· Monitor your blood pressure. °· Start or continue an exercise plan. °· Get immunizations as directed by your health care provider. °· Take vitamin and mineral supplements as directed by your health care provider. °SEEK IMMEDIATE MEDICAL CARE IF: °· Your symptoms get worse or you develop   new symptoms.  You develop symptoms of end-stage kidney disease. These include:  Headaches.  Abnormally dark or light skin.  Numbness in the hands or feet.  Easy  bruising.  Frequent hiccups.  Menstruation stops.  You have a fever.  You have decreased urine production.  You havepain or bleeding when urinating. MAKE SURE YOU:  Understand these instructions.  Will watch your condition.  Will get help right away if you are not doing well or get worse. FOR MORE INFORMATION   American Association of Kidney Patients: BombTimer.gl  National Kidney Foundation: www.kidney.[place]: https://mathis.com/  [facility]: www.lifeoptions.org and www.kidneyschool.org   This information is not intended to replace advice given to you by your health care provider. Make sure you discuss any questions you have with your health care provider.   Document Released: 03/03/2008 Document Revised: 06/15/2014 Document Reviewed: 01/22/2012 Elsevier Interactive Patient Education 2016 [place] DASH stands for "Dietary Approaches to Stop Hypertension." The DASH eating plan is a healthy eating plan that has been shown to reduce high blood pressure (hypertension). Additional health benefits may include reducing the risk of type 2 diabetes mellitus, heart disease, and stroke. The DASH eating plan may also help with weight loss. WHAT DO I NEED TO KNOW ABOUT THE DASH EATING PLAN? For the DASH eating plan, you will follow these general guidelines:  Choose foods with a percent daily value for sodium of less than 5% (as listed on the food label).  Use salt-free seasonings or herbs instead of table salt or sea salt.  Check with your health care provider or pharmacist before using salt substitutes.  Eat lower-sodium products, often labeled as "lower sodium" or "no salt added."  Eat fresh foods.  Eat more vegetables, fruits, and low-fat dairy products.  Choose whole grains. Look for the word "whole" as the first word in the ingredient list.  Choose fish and skinless chicken or [place] more often than red meat.  Limit fish, poultry, and meat to 6 oz (170 g) each day.  Limit sweets, desserts, sugars, and sugary drinks.  Choose heart-healthy fats.  Limit cheese to 1 oz (28 g) per day.  Eat more home-cooked food and less restaurant, buffet, and fast food.  Limit fried foods.  Cook foods using methods other than frying.  Limit canned vegetables. If you do use them, rinse them well to decrease the sodium.  When eating at a restaurant, ask that your food be prepared with less salt, or no salt if possible. WHAT FOODS CAN I EAT? Seek help from a dietitian for individual calorie needs. Grains Whole grain or whole wheat bread. Brown rice. Whole grain or whole wheat pasta. Quinoa, bulgur, and whole grain cereals. Low-sodium cereals. Corn or whole wheat flour tortillas. Whole grain cornbread. Whole grain crackers. Low-sodium crackers. Vegetables Fresh or frozen vegetables (raw, steamed, roasted, or grilled). Low-sodium or reduced-sodium tomato and vegetable juices. Low-sodium or reduced-sodium tomato sauce and paste. Low-sodium or reduced-sodium canned vegetables.  Fruits All fresh, canned (in natural juice), or frozen fruits. Meat and Other Protein Products Ground beef (85% or leaner), grass-fed beef, or beef trimmed of fat. Skinless chicken or [place]. Ground chicken or [place]. Pork trimmed of fat. All fish and seafood. Eggs. Dried beans, peas, or lentils. Unsalted nuts and seeds. Unsalted canned beans. Dairy Low-fat dairy products, such as skim or 1% milk, 2% or reduced-fat cheeses, low-fat ricotta or cottage cheese, or plain low-fat yogurt. Low-sodium or reduced-sodium cheeses.  Fats and Oils Tub margarines without trans fats. Light or reduced-fat mayonnaise and salad dressings (reduced sodium). Avocado. Safflower, olive, or canola oils. Natural peanut or almond butter. Other Unsalted popcorn and pretzels. The items listed above may not be a complete list of recommended foods or beverages. Contact  your dietitian for more options. WHAT FOODS ARE NOT RECOMMENDED? Grains White bread. White pasta. White rice. Refined cornbread. Bagels and croissants. Crackers that contain trans fat. Vegetables Creamed or fried vegetables. Vegetables in a cheese sauce. Regular canned vegetables. Regular canned tomato sauce and paste. Regular tomato and vegetable juices. Fruits Dried fruits. Canned fruit in light or heavy syrup. Fruit juice. Meat and Other Protein Products Fatty cuts of meat. Ribs, chicken wings, bacon, sausage, bologna, salami, chitterlings, fatback, hot dogs, bratwurst, and packaged luncheon meats. Salted nuts and seeds. Canned beans with salt. Dairy Whole or 2% milk, cream, half-and-half, and cream cheese. Whole-fat or sweetened yogurt. Full-fat cheeses or blue cheese. Nondairy creamers and whipped toppings. Processed cheese, cheese spreads, or cheese curds. Condiments Onion and garlic salt, seasoned salt, table salt, and sea salt. Canned and packaged gravies. Worcestershire sauce. Tartar sauce. Barbecue sauce. Teriyaki sauce. Soy sauce, including reduced sodium. Steak sauce. Fish sauce. Oyster sauce. Cocktail sauce. Horseradish. Ketchup and mustard. Meat flavorings and tenderizers. Bouillon cubes. Hot sauce. Tabasco sauce. Marinades. Taco seasonings. Relishes. Fats and Oils Butter, stick margarine, lard, shortening, ghee, and bacon fat. Coconut, palm kernel, or palm oils. Regular salad dressings. Other Pickles and olives. Salted popcorn and pretzels. The items listed above may not be a complete list of foods and beverages to avoid. Contact your dietitian for more information. WHERE CAN I FIND MORE INFORMATION? National Heart, Lung, and Blood Institute: travelstabloid.com   This information is not intended to replace advice given to you by your health care provider. Make sure you discuss any questions you have with your health care provider.   Document  Released: 05/14/2011 Document Revised: 06/15/2014 Document Reviewed: 03/29/2013 Elsevier Interactive Patient Education [employer].

## 2015-03-13 NOTE — Assessment & Plan Note (Signed)
UA negative. Await blood work.

## 2015-03-14 LAB — HELICOBACTER PYLORI  ANTIBODY, IGM: H pylori, IgM Abs: <9 units (ref 0.0–8.9)

## 2015-03-14 LAB — COMPREHENSIVE METABOLIC PANEL
A/G RATIO: 2.3 (ref 1.1–2.5)
ALT: 12 IU/L (ref 0–32)
AST: 15 IU/L (ref 0–40)
Albumin: 4.2 g/dL (ref 3.5–5.5)
Alkaline Phosphatase: 43 IU/L (ref 39–117)
BUN/Creatinine Ratio: 18 (ref 9–23)
BUN: 14 mg/dL (ref 6–24)
Bilirubin Total: 0.4 mg/dL (ref 0.0–1.2)
CO2: 26 mmol/L (ref 18–29)
Calcium: 9.2 mg/dL (ref 8.7–10.2)
Chloride: 100 mmol/L (ref 97–108)
Creatinine, Ser: 0.79 mg/dL (ref 0.57–1.00)
GFR calc Af Amer: 100 mL/min/{1.73_m2} (ref >59)
GFR calc non Af Amer: 86 mL/min/{1.73_m2} (ref >59)
Globulin, Total: 1.8 g/dL (ref 1.5–4.5)
Glucose: 87 mg/dL (ref 65–99)
POTASSIUM: 4.5 mmol/L (ref 3.5–5.2)
Sodium: 140 mmol/L (ref 134–144)
TOTAL PROTEIN: 6 g/dL (ref 6.0–8.5)

## 2015-03-14 LAB — AMYLASE: Amylase: 30 U/L — ABNORMAL LOW (ref 31–124)

## 2015-03-14 LAB — HEPATITIS PANEL, ACUTE
HEP B S AG: NEGATIVE
Hep A IgM: NEGATIVE
Hep B C IgM: NEGATIVE

## 2015-03-14 LAB — RPR: RPR: NONREACTIVE

## 2015-03-14 LAB — HSV(HERPES SIMPLEX VRS) I + II AB-IGG
HSV 1 Glycoprotein G Ab, IgG: 27.7 index — ABNORMAL HIGH (ref 0.00–0.90)
HSV 2 GLYCOPROTEIN G AB, IGG: 5.84 {index} — AB (ref 0.00–0.90)

## 2015-03-14 LAB — HIV ANTIBODY (ROUTINE TESTING W REFLEX): HIV Screen 4th Generation wRfx: NONREACTIVE

## 2015-03-14 LAB — LIPASE: Lipase: 22 U/L (ref 0–59)

## 2015-03-18 ENCOUNTER — Other Ambulatory Visit: Payer: Self-pay | Admitting: Family Medicine

## 2015-03-18 DIAGNOSIS — Z113 Encounter for screening for infections with a predominantly sexual mode of transmission: Secondary | ICD-10-CM

## 2015-03-26 ENCOUNTER — Encounter: Payer: Self-pay | Admitting: Family Medicine

## 2015-03-26 ENCOUNTER — Ambulatory Visit (INDEPENDENT_AMBULATORY_CARE_PROVIDER_SITE_OTHER): Payer: Self-pay | Admitting: Family Medicine

## 2015-03-26 VITALS — BP 151/89 | HR 61 | Temp 96.1°F | Ht 63.6 in | Wt 237.0 lb

## 2015-03-26 DIAGNOSIS — I129 Hypertensive chronic kidney disease with stage 1 through stage 4 chronic kidney disease, or unspecified chronic kidney disease: Secondary | ICD-10-CM

## 2015-03-26 DIAGNOSIS — J449 Chronic obstructive pulmonary disease, unspecified: Secondary | ICD-10-CM

## 2015-03-26 MED ORDER — LOSARTAN POTASSIUM 100 MG PO TABS: 100.0000 mg | ORAL_TABLET | Freq: Every day | ORAL | Status: DC

## 2015-03-26 MED ORDER — FLUTICASONE-SALMETEROL 100-50 MCG/DOSE IN AEPB: 1.0000 | INHALATION_SPRAY | Freq: Two times a day (BID) | RESPIRATORY_TRACT | Status: DC

## 2015-03-26 NOTE — Assessment & Plan Note (Signed)
Continue PRN albuterol. Rx for advair given today. Continue to monitor. Recheck lungs in 1 month.

## 2015-03-26 NOTE — Progress Notes (Signed)
BP 151/89 mmHg  Pulse 61  Temp(Src) 96.1 F (35.6 C)  Ht 5' 3.6" (1.615 m)  Wt 237 lb (107.502 kg)  BMI 41.22 kg/m2  SpO2 98%   Subjective:    Patient ID: Hannah Kaiser, female    DOB: 18-Jan-1961, 54 y.o.   MRN: [id number]  HPI: Hannah Kaiser is a 54 y.o. female  Chief Complaint  Patient presents with  . Hypertension   HYPERTENSION Hypertension status: better  Satisfied with current treatment? no Duration of hypertension: chronic BP monitoring frequency:  rarely BP medication side effects:  no Medication compliance: excellent compliance Aspirin: no Recurrent headaches: yes Visual changes: no Palpitations: yes Dyspnea: yes Chest pain: no Lower extremity edema: no Dizzy/lightheaded: no   Relevant past medical, surgical, family and social history reviewed and updated as indicated. Interim medical history since our last visit reviewed. Allergies and medications reviewed and updated.  Review of Systems  Constitutional: Negative.   Respiratory: Negative.   Cardiovascular: Negative.   Genitourinary: Negative.   Psychiatric/Behavioral: Negative.     Per HPI unless specifically indicated above     Objective:    BP 151/89 mmHg  Pulse 61  Temp(Src) 96.1 F (35.6 C)  Ht 5' 3.6" (1.615 m)  Wt 237 lb (107.502 kg)  BMI 41.22 kg/m2  SpO2 98%  Wt Readings from Last 3 Encounters:  03/26/15 237 lb (107.502 kg)  03/13/15 244 lb 11.2 oz (110.995 kg)  12/16/14 245 lb (111.131 kg)    Physical Exam  Constitutional: She is oriented to person, place, and time. She appears well-developed and well-nourished. No distress.  HENT:  Head: Normocephalic and atraumatic.  Right Ear: Hearing normal.  Left Ear: Hearing normal.  Nose: Nose normal.  Eyes: Conjunctivae and lids are normal. Right eye exhibits no discharge. Left eye exhibits no discharge. No scleral icterus.  Cardiovascular: Normal rate, regular rhythm, normal heart sounds and intact distal pulses.  Exam reveals no gallop  and no friction rub.   No murmur heard. Pulmonary/Chest: Effort normal and breath sounds normal. No respiratory distress. She has no wheezes. She has no rales. She exhibits no tenderness.  Musculoskeletal: Normal range of motion.  Neurological: She is alert and oriented to person, place, and time.  Skin: Skin is intact. No rash noted.  Psychiatric: She has a normal mood and affect. Her speech is normal and behavior is normal. Judgment and thought content normal. Cognition and memory are normal.  Nursing note and vitals reviewed.   Results for orders placed or performed in visit on 03/13/15  Microalbumin, Urine Waived  Result Value Ref Range   Microalb, Ur Waived 10 0 - 19 mg/L   Creatinine, Urine Waived 50 10 - 300 mg/dL   Microalb/Creat Ratio 30-300 (H) <30 mg/g  Comprehensive metabolic panel  Result Value Ref Range   Glucose 87 65 - 99 mg/dL   BUN 14 6 - 24 mg/dL   Creatinine, Ser 0.79 0.57 - 1.00 mg/dL   GFR calc non Af Amer 86 >59 mL/min/1.73   GFR calc Af Amer 100 >59 mL/min/1.73   BUN/Creatinine Ratio 18 9 - 23   Sodium 140 134 - 144 mmol/L   Potassium 4.5 3.5 - 5.2 mmol/L   Chloride 100 97 - 108 mmol/L   CO2 26 18 - 29 mmol/L   Calcium 9.2 8.7 - 10.2 mg/dL   Total Protein 6.0 6.0 - 8.5 g/dL   Albumin 4.2 3.5 - 5.5 g/dL   Globulin, Total 1.8 1.5 -  4.5 g/dL   Albumin/Globulin Ratio 2.3 1.1 - 2.5   Bilirubin Total 0.4 0.0 - 1.2 mg/dL   Alkaline Phosphatase 43 39 - 117 IU/L   AST 15 0 - 40 IU/L   ALT 12 0 - 32 IU/L  UA/M w/rflx Culture, Routine  Result Value Ref Range   Specific Gravity, UA 1.015 1.005 - 1.030   pH, UA 7.5 5.0 - 7.5   Color, UA Yellow Yellow   Appearance Ur Cloudy (A) Clear   Leukocytes, UA Negative Negative   Protein, UA Negative Negative/Trace   Glucose, UA Negative Negative   Ketones, UA Negative Negative   RBC, UA Negative Negative   Bilirubin, UA Negative Negative   Urobilinogen, Ur 0.2 0.2 - 1.0 mg/dL   Nitrite, UA Negative Negative   Amylase  Result Value Ref Range   Amylase 30 (L) 31 - 124 U/L  Lipase  Result Value Ref Range   Lipase 22 0 - 59 U/L  Hepatitis, Acute  Result Value Ref Range   Hep A IgM Negative Negative   Hepatitis B Surface Ag Negative Negative   Hep B C IgM Negative Negative   Hep C Virus Ab <0.1 0.0 - 0.9 s/co ratio  RPR  Result Value Ref Range   RPR Ser Ql Non Reactive Non Reactive  HIV antibody  Result Value Ref Range   HIV Screen 4th Generation wRfx Non Reactive Non Reactive  HSV(herpes simplex vrs) 1+2 ab-IgG  Result Value Ref Range   HSV 1 Glycoprotein G Ab, IgG 27.70 (H) 0.00 - 0.90 index   HSV 2 Glycoprotein G Ab, IgG 5.84 (H) 0.00 - 0.34 index  Helicobacter Pylori Antibody, IGM  Result Value Ref Range   H pylori, IgM Abs <9.0 0.0 - 8.9 units      Assessment & Plan:   Problem List Items Addressed This Visit      Respiratory   COPD (chronic obstructive pulmonary disease) (HCC)    Continue PRN albuterol. Rx for advair given today. Continue to monitor. Recheck lungs in 1 month.       Relevant Medications   Fluticasone-Salmeterol (ADVAIR) 100-50 MCG/DOSE AEPB     Genitourinary   Benign hypertensive renal disease - Primary    Better, but still high. Will increase losartan to 100mg  daily and recheck in 1 month with BMP      Relevant Orders   Basic metabolic panel       Follow up plan: Return in about 4 weeks (around 04/23/2015) for BP recheck.

## 2015-03-26 NOTE — Assessment & Plan Note (Signed)
Better, but still high. Will increase losartan to 100mg  daily and recheck in 1 month with BMP

## 2015-04-12 ENCOUNTER — Encounter: Payer: Self-pay | Admitting: Family Medicine

## 2015-04-12 ENCOUNTER — Ambulatory Visit (INDEPENDENT_AMBULATORY_CARE_PROVIDER_SITE_OTHER): Payer: Self-pay | Admitting: Family Medicine

## 2015-04-12 VITALS — BP 130/74 | HR 56 | Temp 98.5°F | Wt 242.0 lb

## 2015-04-12 DIAGNOSIS — R001 Bradycardia, unspecified: Secondary | ICD-10-CM

## 2015-04-12 DIAGNOSIS — I129 Hypertensive chronic kidney disease with stage 1 through stage 4 chronic kidney disease, or unspecified chronic kidney disease: Secondary | ICD-10-CM

## 2015-04-12 DIAGNOSIS — M170 Bilateral primary osteoarthritis of knee: Secondary | ICD-10-CM

## 2015-04-12 MED ORDER — TRAMADOL HCL 50 MG PO TABS: 50.0000 mg | ORAL_TABLET | Freq: Three times a day (TID) | ORAL | Status: DC | PRN

## 2015-04-12 MED ORDER — ATENOLOL 25 MG PO TABS: 12.5000 mg | ORAL_TABLET | Freq: Every day | ORAL | Status: DC

## 2015-04-12 NOTE — Assessment & Plan Note (Signed)
Continue to follow with [facility] ortho regarding her knees. Work on weight loss and exercises. Will start tramadol PRN for pain. Controlled substance agreement signed today. Continue to monitor. Recheck 1 month.

## 2015-04-12 NOTE — Assessment & Plan Note (Signed)
BP under good control. Continue current regimen. Continue to monitor.

## 2015-04-12 NOTE — Progress Notes (Signed)
BP 130/74 mmHg  Pulse 56  Temp(Src) 98.5 F (36.9 C)  Wt 242 lb (109.77 kg)  SpO2 98%   Subjective:    Patient ID: Hannah Kaiser, female    DOB: 09-16-1960, 54 y.o.   MRN: [id number]  HPI: Hannah Kaiser is a 54 y.o. female  Chief Complaint  Patient presents with  . Knee Pain    patient has bilateral knee pain   HYPERTENSION Hypertension status: controlled  Satisfied with current treatment? yes Duration of hypertension: chronic BP monitoring frequency:  rarely BP medication side effects:  yes- feeling really tired Medication compliance: excellent compliance Aspirin: no Recurrent headaches: no Visual changes: no Palpitations: no Dyspnea: no Chest pain: no Lower extremity edema: no Dizzy/lightheaded: no  KNEE PAIN- saw orthopedics. May need a knee replacement. Pain is really bad. Would like something to help with the pain until she is able to have the knee replacement. Worse after steroid injection Duration: chronic Involved knee: bilateral Mechanism of injury: unknown- known OA Location:diffuse Onset: gradual Severity: moderate  Quality:  sharp, aching and tearing Frequency: constant Radiation: no Aggravating factors: weight bearing, walking, running and stairs  Alleviating factors: ice, HEP, APAP and NSAIDs  Status: worse Treatments attempted: rest, ice, heat, APAP, ibuprofen and HEP  Relief with NSAIDs?:  no Weakness with weight bearing or walking: yes Sensation of giving way: yes Locking: yes Popping: yes Bruising: no Swelling: yes Redness: no Paresthesias/decreased sensation: yes Fevers: no  Relevant past medical, surgical, family and social history reviewed and updated as indicated. Interim medical history since our last visit reviewed. Allergies and medications reviewed and updated.  Review of Systems  Constitutional: Negative.   Respiratory: Negative.   Cardiovascular: Negative.   Psychiatric/Behavioral: Negative.     Per HPI unless specifically  indicated above     Objective:    BP 130/74 mmHg  Pulse 56  Temp(Src) 98.5 F (36.9 C)  Wt 242 lb (109.77 kg)  SpO2 98%  Wt Readings from Last 3 Encounters:  04/12/15 242 lb (109.77 kg)  03/26/15 237 lb (107.502 kg)  03/13/15 244 lb 11.2 oz (110.995 kg)    Physical Exam  Constitutional: She is oriented to person, place, and time. She appears well-developed and well-nourished. No distress.  HENT:  Head: Normocephalic and atraumatic.  Right Ear: Hearing normal.  Left Ear: Hearing normal.  Nose: Nose normal.  Eyes: Conjunctivae and lids are normal. Right eye exhibits no discharge. Left eye exhibits no discharge. No scleral icterus.  Cardiovascular: Normal rate, regular rhythm, normal heart sounds and intact distal pulses.  Exam reveals no gallop and no friction rub.   No murmur heard. Pulmonary/Chest: Effort normal and breath sounds normal. No respiratory distress. She has no wheezes. She has no rales. She exhibits no tenderness.  Neurological: She is alert and oriented to person, place, and time.  Skin: Skin is warm, dry and intact. No rash noted. No erythema. No pallor.  Psychiatric: She has a normal mood and affect. Her speech is normal and behavior is normal. Judgment and thought content normal. Cognition and memory are normal.  Nursing note and vitals reviewed.   Results for orders placed or performed in visit on 03/13/15  Microalbumin, Urine Waived  Result Value Ref Range   Microalb, Ur Waived 10 0 - 19 mg/L   Creatinine, Urine Waived 50 10 - 300 mg/dL   Microalb/Creat Ratio 30-300 (H) <30 mg/g  Comprehensive metabolic panel  Result Value Ref Range   Glucose 87 65 -  99 mg/dL   BUN 14 6 - 24 mg/dL   Creatinine, Ser 0.79 0.57 - 1.00 mg/dL   GFR calc non Af Amer 86 >59 mL/min/1.73   GFR calc Af Amer 100 >59 mL/min/1.73   BUN/Creatinine Ratio 18 9 - 23   Sodium 140 134 - 144 mmol/L   Potassium 4.5 3.5 - 5.2 mmol/L   Chloride 100 97 - 108 mmol/L   CO2 26 18 - 29 mmol/L    Calcium 9.2 8.7 - 10.2 mg/dL   Total Protein 6.0 6.0 - 8.5 g/dL   Albumin 4.2 3.5 - 5.5 g/dL   Globulin, Total 1.8 1.5 - 4.5 g/dL   Albumin/Globulin Ratio 2.3 1.1 - 2.5   Bilirubin Total 0.4 0.0 - 1.2 mg/dL   Alkaline Phosphatase 43 39 - 117 IU/L   AST 15 0 - 40 IU/L   ALT 12 0 - 32 IU/L  UA/M w/rflx Culture, Routine  Result Value Ref Range   Specific Gravity, UA 1.015 1.005 - 1.030   pH, UA 7.5 5.0 - 7.5   Color, UA Yellow Yellow   Appearance Ur Cloudy (A) Clear   Leukocytes, UA Negative Negative   Protein, UA Negative Negative/Trace   Glucose, UA Negative Negative   Ketones, UA Negative Negative   RBC, UA Negative Negative   Bilirubin, UA Negative Negative   Urobilinogen, Ur 0.2 0.2 - 1.0 mg/dL   Nitrite, UA Negative Negative  Amylase  Result Value Ref Range   Amylase 30 (L) 31 - 124 U/L  Lipase  Result Value Ref Range   Lipase 22 0 - 59 U/L  Hepatitis, Acute  Result Value Ref Range   Hep A IgM Negative Negative   Hepatitis B Surface Ag Negative Negative   Hep B C IgM Negative Negative   Hep C Virus Ab <0.1 0.0 - 0.9 s/co ratio  RPR  Result Value Ref Range   RPR Ser Ql Non Reactive Non Reactive  HIV antibody  Result Value Ref Range   HIV Screen 4th Generation wRfx Non Reactive Non Reactive  HSV(herpes simplex vrs) 1+2 ab-IgG  Result Value Ref Range   HSV 1 Glycoprotein G Ab, IgG 27.70 (H) 0.00 - 0.90 index   HSV 2 Glycoprotein G Ab, IgG 5.84 (H) 0.00 - 2.45 index  Helicobacter Pylori Antibody, IGM  Result Value Ref Range   H pylori, IgM Abs <9.0 0.0 - 8.9 units      Assessment & Plan:   Problem List Items Addressed This Visit      Genitourinary   Benign hypertensive renal disease    BP under good control. Continue current regimen. Continue to monitor.         Other   Primary osteoarthritis of both knees - Primary    Continue to follow with [facility] ortho regarding her knees. Work on weight loss and exercises. Will start tramadol PRN for pain. Controlled  substance agreement signed today. Continue to monitor. Recheck 1 month.        Other Visit Diagnoses    Bradycardia        Will cut atenolol in half. Continue to monitor.         Follow up plan: Return in about 4 weeks (around 05/10/2015) for Follow up knee pain and bradycardia.

## 2015-04-29 ENCOUNTER — Ambulatory Visit: Payer: Self-pay | Admitting: Family Medicine

## 2015-05-13 ENCOUNTER — Ambulatory Visit: Payer: Self-pay | Admitting: Family Medicine

## 2015-05-20 ENCOUNTER — Ambulatory Visit: Payer: Self-pay | Admitting: Family Medicine

## 2015-06-04 ENCOUNTER — Ambulatory Visit: Payer: Self-pay | Admitting: Family Medicine

## 2015-06-19 ENCOUNTER — Emergency Department
Admission: EM | Admit: 2015-06-19 | Discharge: 2015-06-19 | Disposition: A | Discharge status: Home / Self Care | Payer: BLUE CROSS/BLUE SHIELD | Attending: Emergency Medicine | Admitting: Emergency Medicine

## 2015-06-19 DIAGNOSIS — R202 Paresthesia of skin: Secondary | ICD-10-CM | POA: Insufficient documentation

## 2015-06-19 DIAGNOSIS — M79605 Pain in left leg: Secondary | ICD-10-CM | POA: Insufficient documentation

## 2015-06-19 DIAGNOSIS — Z79899 Other long term (current) drug therapy: Secondary | ICD-10-CM | POA: Insufficient documentation

## 2015-06-19 DIAGNOSIS — I129 Hypertensive chronic kidney disease with stage 1 through stage 4 chronic kidney disease, or unspecified chronic kidney disease: Secondary | ICD-10-CM | POA: Insufficient documentation

## 2015-06-19 DIAGNOSIS — F17200 Nicotine dependence, unspecified, uncomplicated: Secondary | ICD-10-CM | POA: Insufficient documentation

## 2015-06-19 DIAGNOSIS — Z7951 Long term (current) use of inhaled steroids: Secondary | ICD-10-CM | POA: Diagnosis not present

## 2015-06-19 DIAGNOSIS — N189 Chronic kidney disease, unspecified: Secondary | ICD-10-CM | POA: Diagnosis not present

## 2015-06-19 DIAGNOSIS — R200 Anesthesia of skin: Secondary | ICD-10-CM | POA: Insufficient documentation

## 2015-06-19 LAB — URINALYSIS COMPLETE WITH MICROSCOPIC (ARMC ONLY)
BILIRUBIN URINE: NEGATIVE
Bacteria, UA: NONE SEEN
GLUCOSE, UA: NEGATIVE mg/dL
Hgb urine dipstick: NEGATIVE
Ketones, ur: NEGATIVE mg/dL
Leukocytes, UA: NEGATIVE
Nitrite: NEGATIVE
Protein, ur: NEGATIVE mg/dL
Specific Gravity, Urine: 1.01 (ref 1.005–1.030)
pH: 8 (ref 5.0–8.0)

## 2015-06-19 LAB — COMPREHENSIVE METABOLIC PANEL
ALT: 15 U/L (ref 14–54)
AST: 16 U/L (ref 15–41)
Albumin: 4.2 g/dL (ref 3.5–5.0)
Alkaline Phosphatase: 42 U/L (ref 38–126)
Anion gap: 7 (ref 5–15)
BUN: 15 mg/dL (ref 6–20)
CO2: 26 mmol/L (ref 22–32)
Calcium: 9.4 mg/dL (ref 8.9–10.3)
Chloride: 107 mmol/L (ref 101–111)
Creatinine, Ser: 0.74 mg/dL (ref 0.44–1.00)
GFR calc Af Amer: >60 mL/min (ref >60)
GFR calc non Af Amer: >60 mL/min (ref >60)
GLUCOSE: 95 mg/dL (ref 65–99)
POTASSIUM: 3.9 mmol/L (ref 3.5–5.1)
Sodium: 140 mmol/L (ref 135–145)
TOTAL PROTEIN: 6.6 g/dL (ref 6.5–8.1)
Total Bilirubin: 1 mg/dL (ref 0.3–1.2)

## 2015-06-19 LAB — CBC WITH DIFFERENTIAL/PLATELET
BASOS ABS: 0 10*3/uL (ref 0–0.1)
Basophils Relative: 1 %
EOS ABS: 0.2 10*3/uL (ref 0–0.7)
EOS PCT: 3 %
HCT: 39.3 % (ref 35.0–47.0)
Hemoglobin: 13.2 g/dL (ref 12.0–16.0)
LYMPHS ABS: 1.9 10*3/uL (ref 1.0–3.6)
LYMPHS PCT: 28 %
MCH: 30 pg (ref 26.0–34.0)
MCHC: 33.5 g/dL (ref 32.0–36.0)
MCV: 89.5 fL (ref 80.0–100.0)
MONO ABS: 0.4 10*3/uL (ref 0.2–0.9)
Monocytes Relative: 7 %
Neutro Abs: 4.1 10*3/uL (ref 1.4–6.5)
Neutrophils Relative %: 61 %
PLATELETS: 196 10*3/uL (ref 150–440)
RBC: 4.39 MIL/uL (ref 3.80–5.20)
RDW: 13.8 % (ref 11.5–14.5)
WBC: 6.6 10*3/uL (ref 3.6–11.0)

## 2015-06-19 MED ORDER — TRAMADOL HCL 50 MG PO TABS: 50.0000 mg | ORAL_TABLET | Freq: Four times a day (QID) | ORAL | Status: DC | PRN

## 2015-06-19 NOTE — ED Provider Notes (Signed)
[facility] Emergency Department Provider Note  ____________________________________________  Time seen: Approximately 4:03 PM  I have reviewed the triage vital signs and the nursing notes.   HISTORY  Chief Complaint Numbness and Tingling    HPI Hannah Kaiser is a 55 y.o. female who presents with nonspecific complaints of some numbness and tingling from her head down to her toes, primarily on the left side but also sometimes on the right.  She also reports some pain in both of her knees and occasionally some pain in the medial aspect of her left calf.  She has not been on any long trips recently and has no history of blood clots.  She states that the tingling has occurred in the past and she thinks is related to her blood pressure.  She describes it as a painful kind of tingling but is having no weakness in any of her extremities.  He denies chest pain, shortness of breath, nausea, vomiting, abdominal pain, dysuria.  She describes symptoms as gradual in onset over a few weeks although it is been worse recently and nothing makes it better and nothing makes it worse.  He has had back and neck surgery in the past and states that the tingling feels kind of like that.   Past Medical History  Diagnosis Date  . GERD (gastroesophageal reflux disease)   . Colon polyps   . Arthritis   . History of blood transfusion   . Breast pain   . Nipple discharge   . Depression     Patient Active Problem List   Diagnosis Date Noted  . Primary osteoarthritis of both knees 04/12/2015  . COPD (chronic obstructive pulmonary disease) ([place]) 03/26/2015  . Benign hypertensive renal disease 03/13/2015  . Dark urine 03/13/2015    Past Surgical History  Procedure Laterality Date  . Abdominal hysterectomy  2014  . Spine surgery  1989, 1998  . Neck surgery  2003    Current Outpatient Rx  Name  Route  Sig  Dispense  Refill  . albuterol (PROVENTIL HFA;VENTOLIN HFA) 108 (90 BASE)  MCG/ACT inhaler   Inhalation   Inhale 2 puffs into the lungs every 6 (six) hours as needed for wheezing or shortness of breath.         Marland Kitchen atenolol (TENORMIN) 25 MG tablet   Oral   Take 0.5 tablets (12.5 mg total) by mouth daily.   90 tablet   1   . FLUoxetine (PROZAC) 40 MG capsule   Oral   Take 2 capsules (80 mg total) by mouth daily.   180 capsule   1   . Fluticasone-Salmeterol (ADVAIR) 100-50 MCG/DOSE AEPB   Inhalation   Inhale 1 puff into the lungs 2 (two) times daily.   1 each   0   . Fluticasone-Salmeterol (ADVAIR) 100-50 MCG/DOSE AEPB   Inhalation   Inhale into the lungs.         . hydrochlorothiazide (HYDRODIURIL) 25 MG tablet   Oral   Take 1 tablet (25 mg total) by mouth daily.   90 tablet   1   . losartan (COZAAR) 100 MG tablet   Oral   Take 1 tablet (100 mg total) by mouth daily.   90 tablet   1   . traMADol (ULTRAM) 50 MG tablet   Oral   Take 1 tablet (50 mg total) by mouth every 6 (six) hours as needed.   20 tablet   0     Allergies Ace  inhibitors; Azithromycin; and Erythromycin  Family History  Problem Relation Age of Onset  . Cancer Brother     melanoma  . Cancer Maternal Grandfather     prostate  . Cancer Maternal Uncle     lung    Social History Social History  Substance Use Topics  . Smoking status: Current Every Day Smoker -- 1.00 packs/day for 37 years  . Smokeless tobacco: Never Used  . Alcohol Use: No    Review of Systems Constitutional: No fever/chills Eyes: No visual changes. ENT: No sore throat. Cardiovascular: Denies chest pain. Respiratory: Denies shortness of breath. Gastrointestinal: No abdominal pain.  No nausea, no vomiting.  No diarrhea.  No constipation. Genitourinary: Negative for dysuria. Musculoskeletal: Negative for back pain. Skin: Negative for rash. Neurological: Numbness and tingling that comes and goes down the left side.  No weakness in any extremity.  10-point ROS otherwise  negative.  ____________________________________________   PHYSICAL EXAM:  VITAL SIGNS: ED Triage Vitals  Enc Vitals Group     BP 06/19/15 1129 172/107 mmHg     Pulse Rate 06/19/15 1129 74     Resp 06/19/15 1129 18     Temp 06/19/15 1129 97.8 F (36.6 C)     Temp Source 06/19/15 1129 Oral     SpO2 06/19/15 1129 97 %     Weight --      Height --      Head Cir --      Peak Flow --      Pain Score 06/19/15 1129 8     Pain Loc --      Pain Edu? --      Excl. in [place]? --     Constitutional: Alert and oriented. Well appearing and in no acute distress. Eyes: Conjunctivae are normal. PERRL. EOMI. Head: Atraumatic. Nose: No congestion/rhinnorhea. Mouth/Throat: Mucous membranes are moist.  Oropharynx non-erythematous. Neck: No stridor.  No meningismus Cardiovascular: Normal rate, regular rhythm. Grossly normal heart sounds.  Good peripheral circulation. Respiratory: Normal respiratory effort.  No retractions. Lungs CTAB. Gastrointestinal: Soft and nontender. No distention. No abdominal bruits. No CVA tenderness. Musculoskeletal: No lower extremity tenderness nor edema.  No joint effusions.  No pain or tenderness in the popliteal fossa.  Mild tenderness generalized throughout the lower extremities. Neurologic:  Normal speech and language. No gross focal neurologic deficits are appreciated.  Cranial nerves are intact.  No gait instability. Skin:  Skin is warm, dry and intact. No rash noted. Psychiatric: Mood and affect are normal. Speech and behavior are normal.  ____________________________________________   LABS (all labs ordered are listed, but only abnormal results are displayed)  Labs Reviewed  URINALYSIS COMPLETEWITH MICROSCOPIC ([facility] ONLY) - Abnormal; Notable for the following:    Color, Urine YELLOW (*)    APPearance HAZY (*)    Squamous Epithelial / LPF 0-5 (*)    All other components within normal limits  COMPREHENSIVE METABOLIC PANEL  CBC WITH DIFFERENTIAL/PLATELET    ____________________________________________  RADIOLOGY   No results found.  ____________________________________________   PROCEDURES  Procedure(s) performed: None  Critical Care performed: No ____________________________________________   INITIAL IMPRESSION / ASSESSMENT AND PLAN / ED COURSE  Pertinent labs & imaging results that were available during my care of the patient were reviewed by me and considered in my medical decision making (see chart for details).  The patient has a completely normal workup and a normal and reassuring physical exam.  There is no evidence to suggest CVA, ACS, DVT, or other  acute or emergent medical condition.  We discussed some mild pain control and outpatient follow-up for possible neuropathy and she is in agreement with this plan.  She seems reassured by her normal lab results.  She will follow up with her PCP about her blood pressure as well rather than [place] making changes today.  ____________________________________________  FINAL CLINICAL IMPRESSION(S) / ED DIAGNOSES  Final diagnoses:  Tingling sensation      NEW MEDICATIONS STARTED DURING THIS VISIT:  Discharge Medication List as of 06/19/2015  5:03 PM       Hinda Kehr, MD 06/19/15 2221

## 2015-06-19 NOTE — Discharge Instructions (Signed)
Your workup in the Emergency Department today was reassuring.  We did not find any specific abnormalities.  We recommend you drink plenty of fluids, take your regular medications and/or any new ones prescribed today, and follow up with the doctor(s) listed in these documents as recommended.  Return to the Emergency Department if you develop new or worsening symptoms that concern you.   Paresthesia Paresthesia is an abnormal burning or prickling sensation. This sensation is generally felt in the hands, arms, legs, or feet. However, it may occur in any part of the body. Usually, it is not painful. The feeling may be described as:  Tingling or numbness.  Pins and needles.  Skin crawling.  Buzzing.  Limbs falling asleep.  Itching. Most people experience temporary (transient) paresthesia at some time in their lives. Paresthesia may occur when you breathe too quickly (hyperventilation). It can also occur without any apparent cause. Commonly, paresthesia occurs when pressure is placed on a nerve. The sensation quickly goes away after the pressure is removed. For some people, however, paresthesia is a long-lasting (chronic) condition that is caused by an underlying disorder. If you continue to have paresthesia, you may need further medical evaluation. HOME CARE INSTRUCTIONS Watch your condition for any changes. Taking the following actions may help to lessen any discomfort that you are feeling:  Avoid drinking alcohol.  Try acupuncture or massage to help relieve your symptoms.  Keep all follow-up visits as directed by your health care provider. This is important. SEEK MEDICAL CARE IF:  You continue to have episodes of paresthesia.  Your burning or prickling feeling gets worse when you walk.  You have pain, cramps, or dizziness.  You develop a rash. SEEK IMMEDIATE MEDICAL CARE IF:  You feel weak.  You have trouble walking or moving.  You have problems with speech, understanding, or  vision.  You feel confused.  You cannot control your bladder or bowel movements.  You have numbness after an injury.  You faint.   This information is not intended to replace advice given to you by your health care provider. Make sure you discuss any questions you have with your health care provider.   Document Released: 05/15/2002 Document Revised: 10/09/2014 Document Reviewed: 05/21/2014 Elsevier Interactive Patient Education [employer].

## 2015-06-19 NOTE — ED Notes (Addendum)
Pt reports numbness/tingling from head to toe.  Reports pain in her knees. Reports symptoms for "awhile" but has worsened since fall last week.

## 2015-06-25 ENCOUNTER — Encounter: Payer: Self-pay | Admitting: Family Medicine

## 2015-06-25 ENCOUNTER — Ambulatory Visit (INDEPENDENT_AMBULATORY_CARE_PROVIDER_SITE_OTHER): Payer: BLUE CROSS/BLUE SHIELD | Admitting: Family Medicine

## 2015-06-25 VITALS — BP 176/100 | HR 69 | Temp 99.2°F | Ht 64.0 in | Wt 247.0 lb

## 2015-06-25 DIAGNOSIS — R300 Dysuria: Secondary | ICD-10-CM

## 2015-06-25 DIAGNOSIS — B372 Candidiasis of skin and nail: Secondary | ICD-10-CM | POA: Diagnosis not present

## 2015-06-25 DIAGNOSIS — I129 Hypertensive chronic kidney disease with stage 1 through stage 4 chronic kidney disease, or unspecified chronic kidney disease: Secondary | ICD-10-CM

## 2015-06-25 DIAGNOSIS — R202 Paresthesia of skin: Secondary | ICD-10-CM

## 2015-06-25 DIAGNOSIS — M170 Bilateral primary osteoarthritis of knee: Secondary | ICD-10-CM | POA: Diagnosis not present

## 2015-06-25 LAB — UA/M W/RFLX CULTURE, ROUTINE
BILIRUBIN UA: NEGATIVE
Glucose, UA: NEGATIVE
KETONES UA: NEGATIVE
Leukocytes, UA: NEGATIVE
Nitrite, UA: NEGATIVE
Protein, UA: NEGATIVE
Specific Gravity, UA: 1.015 (ref 1.005–1.030)
Urobilinogen, Ur: 0.2 mg/dL (ref 0.2–1.0)
pH, UA: 8 — ABNORMAL HIGH (ref 5.0–7.5)

## 2015-06-25 LAB — MICROSCOPIC EXAMINATION: WBC, UA: NONE SEEN /hpf (ref 0–?)

## 2015-06-25 MED ORDER — NYSTATIN-TRIAMCINOLONE 100000-0.1 UNIT/GM-% EX OINT: 1.0000 "application " | TOPICAL_OINTMENT | Freq: Two times a day (BID) | CUTANEOUS | Status: DC

## 2015-06-25 MED ORDER — AMLODIPINE BESYLATE 5 MG PO TABS: 5.0000 mg | ORAL_TABLET | Freq: Every day | ORAL | Status: DC

## 2015-06-25 NOTE — Progress Notes (Signed)
BP 176/100 mmHg  Pulse 69  Temp(Src) 99.2 F (37.3 C)  Ht 5\' 4"  (1.626 m)  Wt 247 lb (112.038 kg)  BMI 42.38 kg/m2  SpO2 99%   Subjective:    Patient ID: Hannah Kaiser, female    DOB: Mar 03, 1961, 55 y.o.   MRN: [id number]  HPI: Hannah Kaiser is a 55 y.o. female  Chief Complaint  Patient presents with  . Knee Pain  . Back Pain  . Muscle weakness    Patient would like a full work up for MS  . Rash   RASH Duration:  2 days  Location: groin  Itching: no Burning: no Redness: yes Oozing: no Scaling: no Blisters: no Painful: no Fevers: no Change in detergents/soaps/personal care products: no Recent illness: no Recent travel:no History of same: no Context: worse Alleviating factors: nothing Treatments attempted:nothing Shortness of breath: yes  Throat/tongue swelling: no Myalgias/arthralgias: yes  Having pain in her head. Numb from her head down to her toes. Pain in her neck that goes down through her body, has had 2 lower back surgeries and 2 neck surgeries. Feels like she can't walk or talk. She is very concerned about having MS-  Would like to see neurology to be evaluated  BACK PAIN- saw Dr. Zigmund Daniel for orthopedist and wants to go back to see him as he told her she needed a knee replacement. Feels like her knee is acting up a lot, both of them, but especially the L one. When she saw ortho before, they just told her to lose weight, she didn't feel like that was helpful for her. Now that she has insurance, she would like to be evaluated. Back and neck are also still acting up a lot. Tramadol helped a little bit, but not much. Still in a lot of pain. Would like to see if this gets better with knee getting better, otherwise would like to see neurosurgery  Relevant past medical, surgical, family and social history reviewed and updated as indicated. Interim medical history since our last visit reviewed. Allergies and medications reviewed and updated.  Review of Systems   Constitutional: Negative.        Night sweats  Respiratory: Positive for shortness of breath (x 1week). Negative for apnea, cough, choking, chest tightness, wheezing and stridor.   Cardiovascular: Negative.   Gastrointestinal: Negative.   Genitourinary: Negative.   Psychiatric/Behavioral: Negative.     Per HPI unless specifically indicated above     Objective:    BP 176/100 mmHg  Pulse 69  Temp(Src) 99.2 F (37.3 C)  Ht 5\' 4"  (1.626 m)  Wt 247 lb (112.038 kg)  BMI 42.38 kg/m2  SpO2 99%  Wt Readings from Last 3 Encounters:  06/25/15 247 lb (112.038 kg)  04/12/15 242 lb (109.77 kg)  03/26/15 237 lb (107.502 kg)    Physical Exam  Constitutional: She is oriented to person, place, and time. She appears well-developed and well-nourished. No distress.  HENT:  Head: Normocephalic and atraumatic.  Right Ear: Hearing and external ear normal.  Left Ear: Hearing and external ear normal.  Nose: Nose normal.  Mouth/Throat: Oropharynx is clear and moist. No oropharyngeal exudate.  Eyes: Conjunctivae, EOM and lids are normal. Pupils are equal, round, and reactive to light. Right eye exhibits no discharge. Left eye exhibits no discharge. No scleral icterus.  Cardiovascular: Normal rate, regular rhythm, normal heart sounds and intact distal pulses.   Pulmonary/Chest: Effort normal. No respiratory distress. She has wheezes. She has no rales.  She exhibits no tenderness.  Musculoskeletal: Normal range of motion.  Antalgic gait  Neurological: She is alert and oriented to person, place, and time. She has normal reflexes. She displays normal reflexes. No cranial nerve deficit. She exhibits normal muscle tone. Coordination normal.  Skin: Skin is warm, dry and intact. Rash noted. No erythema. No pallor.  Psychiatric: She has a normal mood and affect. Her speech is normal and behavior is normal. Judgment and thought content normal. Cognition and memory are normal.  Nursing note and vitals  reviewed.   Results for orders placed or performed in visit on 06/25/15  Microscopic Examination  Result Value Ref Range   WBC, UA None seen 0 -  5 /hpf   RBC, UA 0-2 0 -  2 /hpf   Epithelial Cells (non renal) 0-10 0 - 10 /hpf   Crystals Present (A) N/A   Crystal Type Amorphous Sediment N/A   Bacteria, UA Few None seen/Few  UA/M w/rflx Culture, Routine  Result Value Ref Range   Specific Gravity, UA 1.015 1.005 - 1.030   pH, UA 8.0 (H) 5.0 - 7.5   Color, UA Yellow Yellow   Appearance Ur Cloudy (A) Clear   Leukocytes, UA Negative Negative   Protein, UA Negative Negative/Trace   Glucose, UA Negative Negative   Ketones, UA Negative Negative   RBC, UA Trace (A) Negative   Bilirubin, UA Negative Negative   Urobilinogen, Ur 0.2 0.2 - 1.0 mg/dL   Nitrite, UA Negative Negative   Microscopic Examination See below:       Assessment & Plan:   Problem List Items Addressed This Visit      Genitourinary   Benign hypertensive renal disease - Primary    Not under good control at this time. Will add amlodipine and recheck in 1 month. Continue current regimen besides that. Call with any concerns.       Relevant Orders   Ambulatory referral to Orthopedic Surgery   CBC with Differential/Platelet   Comprehensive metabolic panel   TSH     Other   Primary osteoarthritis of both knees    Referral to orthopedics made. Await their input.        Other Visit Diagnoses    Paresthesias        Likely due to radiculopathy. Patient would like to see neurology. Referral generated today. Await their input.     Relevant Orders    Ambulatory referral to Neurology    CBC with Differential/Platelet    TSH    Dysuria        UA negative today. Continue to monitor. Let us know if not getting better or getting worse.     Relevant Orders    UA/M w/rflx Culture, Routine (Completed)    Candidal intertrigo        Will treat with nystatin-triamcinalone. Discussed skin cleansing. Continue to monitor. Let  us know if not getting better or getting worse.     Relevant Medications    nystatin-triamcinolone ointment (MYCOLOG)        Follow up plan: Return in about 4 weeks (around 07/23/2015) for follow up BP.

## 2015-06-25 NOTE — Assessment & Plan Note (Signed)
Not under good control at this time. Will add amlodipine and recheck in 1 month. Continue current regimen besides that. Call with any concerns.

## 2015-06-25 NOTE — Assessment & Plan Note (Signed)
Referral to orthopedics made. Await their input.

## 2015-06-26 LAB — CBC WITH DIFFERENTIAL/PLATELET
Basophils Absolute: 0 10*3/uL (ref 0.0–0.2)
Basos: 1 %
EOS (ABSOLUTE): 0.2 10*3/uL (ref 0.0–0.4)
Eos: 3 %
Hematocrit: 38.6 % (ref 34.0–46.6)
Hemoglobin: 12.9 g/dL (ref 11.1–15.9)
Immature Grans (Abs): 0 10*3/uL (ref 0.0–0.1)
Immature Granulocytes: 0 %
Lymphocytes Absolute: 1.9 10*3/uL (ref 0.7–3.1)
Lymphs: 28 %
MCH: 29.4 pg (ref 26.6–33.0)
MCHC: 33.4 g/dL (ref 31.5–35.7)
MCV: 88 fL (ref 79–97)
MONOS ABS: 0.5 10*3/uL (ref 0.1–0.9)
Monocytes: 7 %
NEUTROS ABS: 4 10*3/uL (ref 1.4–7.0)
Neutrophils: 61 %
Platelets: 245 10*3/uL (ref 150–379)
RBC: 4.39 x10E6/uL (ref 3.77–5.28)
RDW: 13.8 % (ref 12.3–15.4)
WBC: 6.5 10*3/uL (ref 3.4–10.8)

## 2015-06-26 LAB — COMPREHENSIVE METABOLIC PANEL
ALBUMIN: 4.5 g/dL (ref 3.5–5.5)
ALK PHOS: 48 IU/L (ref 39–117)
ALT: 13 IU/L (ref 0–32)
AST: 18 IU/L (ref 0–40)
Albumin/Globulin Ratio: 2.6 — ABNORMAL HIGH (ref 1.1–2.5)
BUN / CREAT RATIO: 16 (ref 9–23)
BUN: 14 mg/dL (ref 6–24)
Bilirubin Total: 0.5 mg/dL (ref 0.0–1.2)
CHLORIDE: 100 mmol/L (ref 96–106)
CO2: 25 mmol/L (ref 18–29)
Calcium: 9.9 mg/dL (ref 8.7–10.2)
Creatinine, Ser: 0.85 mg/dL (ref 0.57–1.00)
GFR calc Af Amer: 91 mL/min/{1.73_m2} (ref >59)
GFR calc non Af Amer: 79 mL/min/{1.73_m2} (ref >59)
Globulin, Total: 1.7 g/dL (ref 1.5–4.5)
Glucose: 83 mg/dL (ref 65–99)
Potassium: 4.4 mmol/L (ref 3.5–5.2)
Sodium: 144 mmol/L (ref 134–144)
Total Protein: 6.2 g/dL (ref 6.0–8.5)

## 2015-06-26 LAB — TSH: TSH: 0.947 u[IU]/mL (ref 0.450–4.500)

## 2015-07-17 ENCOUNTER — Encounter: Payer: Self-pay | Admitting: Family Medicine

## 2015-07-17 ENCOUNTER — Ambulatory Visit (INDEPENDENT_AMBULATORY_CARE_PROVIDER_SITE_OTHER): Payer: BLUE CROSS/BLUE SHIELD | Admitting: Family Medicine

## 2015-07-17 VITALS — BP 167/22 | HR 94 | Temp 95.8°F | Ht 64.0 in | Wt 248.0 lb

## 2015-07-17 DIAGNOSIS — R319 Hematuria, unspecified: Secondary | ICD-10-CM | POA: Diagnosis not present

## 2015-07-17 DIAGNOSIS — J441 Chronic obstructive pulmonary disease with (acute) exacerbation: Secondary | ICD-10-CM | POA: Diagnosis not present

## 2015-07-17 DIAGNOSIS — R609 Edema, unspecified: Secondary | ICD-10-CM

## 2015-07-17 DIAGNOSIS — I129 Hypertensive chronic kidney disease with stage 1 through stage 4 chronic kidney disease, or unspecified chronic kidney disease: Secondary | ICD-10-CM

## 2015-07-17 LAB — UA/M W/RFLX CULTURE, ROUTINE
BILIRUBIN UA: NEGATIVE
Glucose, UA: NEGATIVE
Ketones, UA: NEGATIVE
Leukocytes, UA: NEGATIVE
NITRITE UA: NEGATIVE
PH UA: 7 (ref 5.0–7.5)
Protein, UA: NEGATIVE
RBC, UA: NEGATIVE
Specific Gravity, UA: 1.015 (ref 1.005–1.030)
UUROB: 0.2 mg/dL (ref 0.2–1.0)

## 2015-07-17 MED ORDER — PREDNISONE 10 MG PO TABS: ORAL_TABLET | ORAL | Status: DC

## 2015-07-17 MED ORDER — BENZONATATE 200 MG PO CAPS: 200.0000 mg | ORAL_CAPSULE | Freq: Three times a day (TID) | ORAL | Status: DC | PRN

## 2015-07-17 MED ORDER — ALBUTEROL SULFATE (2.5 MG/3ML) 0.083% IN NEBU: 2.5000 mg | INHALATION_SOLUTION | Freq: Once | RESPIRATORY_TRACT | Status: DC

## 2015-07-17 MED ORDER — DOXYCYCLINE HYCLATE 100 MG PO TABS
100.0000 mg | ORAL_TABLET | Freq: Two times a day (BID) | ORAL | Status: DC
Start: 2015-07-17 — End: 2015-07-29

## 2015-07-17 MED ORDER — HYDROCOD POLST-CPM POLST ER 10-8 MG/5ML PO SUER: 5.0000 mL | Freq: Every evening | ORAL | Status: DC | PRN

## 2015-07-17 NOTE — Progress Notes (Signed)
BP 154/88 mmHg  Pulse 94  Temp(Src) 95.8 F (35.4 C)  Ht 5\' 4"  (1.626 m)  Wt 248 lb (112.492 kg)  BMI 42.55 kg/m2  SpO2 97%   Subjective:    Patient ID: Hannah Kaiser, female    DOB: 08-19-1960, 55 y.o.   MRN: [id number]  HPI: Hannah Kaiser is a 55 y.o. female  Chief Complaint  Patient presents with  . Shortness of Breath    x's 1 week  . Chest Tightness  . Cough   UPPER RESPIRATORY TRACT INFECTION Duration: 1 week Worst symptom: SOB Fever: no, chills and sweats Cough: yes Shortness of breath: yes Wheezing: yes Chest pain: no Chest tightness: yes Chest congestion: yes Nasal congestion: no Runny nose: no Post nasal drip: no Sneezing: no Sore throat: no Swollen glands: no Sinus pressure: no Headache: no Face pain: no Toothache: yes Ear pain: yes left Ear pressure: yes left Eyes red/itching:no Eye drainage/crusting: no  Vomiting: no Rash: no Fatigue: yes Sick contacts: yes Strep contacts: no  Context: worse Recurrent sinusitis: no Relief with OTC cold/cough medications: no  Treatments attempted: none   Peripheral edema- significant, worse over the last 2 weeks, even in her hands. Goes away with laying down. Worse as the day goes on. No pain. Makes her feel very tired. Has gained some wight, now sure if it's fluid or not. Has been feeling SOB, but in COPD exacerbation. No other concerns or complaints at this time.   HYPERTENSION Hypertension status: stable  Satisfied with current treatment? yes Duration of hypertension: chronic BP monitoring frequency:  not checking BP medication side effects:  no Medication compliance: excellent compliance Aspirin: no Recurrent headaches: no Visual changes: no Palpitations: no Dyspnea: yes Chest pain: yes Lower extremity edema: yes Dizzy/lightheaded: no    Relevant past medical, surgical, family and social history reviewed and updated as indicated. Interim medical history since our last visit reviewed. Allergies  and medications reviewed and updated.  Review of Systems  Constitutional: Negative.   HENT: Negative.   Respiratory: Positive for cough, chest tightness, shortness of breath and wheezing. Negative for apnea, choking and stridor.   Cardiovascular: Positive for leg swelling. Negative for chest pain and palpitations.  Musculoskeletal: Positive for myalgias. Negative for back pain, joint swelling, arthralgias, gait problem, neck pain and neck stiffness.  Psychiatric/Behavioral: Negative.     Per HPI unless specifically indicated above     Objective:    BP 154/88 mmHg  Pulse 94  Temp(Src) 95.8 F (35.4 C)  Ht 5\' 4"  (1.626 m)  Wt 248 lb (112.492 kg)  BMI 42.55 kg/m2  SpO2 97%  Wt Readings from Last 3 Encounters:  07/17/15 248 lb (112.492 kg)  06/25/15 247 lb (112.038 kg)  04/12/15 242 lb (109.77 kg)    Physical Exam  Constitutional: She is oriented to person, place, and time. She appears well-developed and well-nourished. No distress.  HENT:  Head: Normocephalic and atraumatic.  Right Ear: Hearing normal.  Left Ear: Hearing normal.  Nose: Nose normal.  Eyes: Conjunctivae and lids are normal. Right eye exhibits no discharge. Left eye exhibits no discharge. No scleral icterus.  Cardiovascular: Normal rate, regular rhythm, normal heart sounds and intact distal pulses.  Exam reveals no gallop and no friction rub.   No murmur heard. Pulmonary/Chest: Effort normal. No respiratory distress. She has no decreased breath sounds. She has wheezes in the right middle field, the right lower field, the left middle field and the left lower field. She  has rhonchi in the right middle field, the right lower field, the left middle field and the left lower field. She has no rales. She exhibits no tenderness.  Musculoskeletal: Normal range of motion.  Neurological: She is alert and oriented to person, place, and time.  Skin: Skin is warm, dry and intact. No rash noted. She is not diaphoretic. No  erythema. No pallor.  Psychiatric: She has a normal mood and affect. Her speech is normal and behavior is normal. Judgment and thought content normal. Cognition and memory are normal.  Nursing note and vitals reviewed.   Results for orders placed or performed in visit on 06/25/15  Microscopic Examination  Result Value Ref Range   WBC, UA None seen 0 -  5 /hpf   RBC, UA 0-2 0 -  2 /hpf   Epithelial Cells (non renal) 0-10 0 - 10 /hpf   Crystals Present (A) N/A   Crystal Type Amorphous Sediment N/A   Bacteria, UA Few None seen/Few  CBC with Differential/Platelet  Result Value Ref Range   WBC 6.5 3.4 - 10.8 x10E3/uL   RBC 4.39 3.77 - 5.28 x10E6/uL   Hemoglobin 12.9 11.1 - 15.9 g/dL   Hematocrit 38.6 34.0 - 46.6 %   MCV 88 79 - 97 fL   MCH 29.4 26.6 - 33.0 pg   MCHC 33.4 31.5 - 35.7 g/dL   RDW 13.8 12.3 - 15.4 %   Platelets 245 150 - 379 x10E3/uL   Neutrophils 61 %   Lymphs 28 %   Monocytes 7 %   Eos 3 %   Basos 1 %   Neutrophils Absolute 4.0 1.4 - 7.0 x10E3/uL   Lymphocytes Absolute 1.9 0.7 - 3.1 x10E3/uL   Monocytes Absolute 0.5 0.1 - 0.9 x10E3/uL   EOS (ABSOLUTE) 0.2 0.0 - 0.4 x10E3/uL   Basophils Absolute 0.0 0.0 - 0.2 x10E3/uL   Immature Granulocytes 0 %   Immature Grans (Abs) 0.0 0.0 - 0.1 x10E3/uL  Comprehensive metabolic panel  Result Value Ref Range   Glucose 83 65 - 99 mg/dL   BUN 14 6 - 24 mg/dL   Creatinine, Ser 0.85 0.57 - 1.00 mg/dL   GFR calc non Af Amer 79 >59 mL/min/1.73   GFR calc Af Amer 91 >59 mL/min/1.73   BUN/Creatinine Ratio 16 9 - 23   Sodium 144 134 - 144 mmol/L   Potassium 4.4 3.5 - 5.2 mmol/L   Chloride 100 96 - 106 mmol/L   CO2 25 18 - 29 mmol/L   Calcium 9.9 8.7 - 10.2 mg/dL   Total Protein 6.2 6.0 - 8.5 g/dL   Albumin 4.5 3.5 - 5.5 g/dL   Globulin, Total 1.7 1.5 - 4.5 g/dL   Albumin/Globulin Ratio 2.6 (H) 1.1 - 2.5   Bilirubin Total 0.5 0.0 - 1.2 mg/dL   Alkaline Phosphatase 48 39 - 117 IU/L   AST 18 0 - 40 IU/L   ALT 13 0 - 32 IU/L   TSH  Result Value Ref Range   TSH 0.947 0.450 - 4.500 uIU/mL  UA/M w/rflx Culture, Routine  Result Value Ref Range   Specific Gravity, UA 1.015 1.005 - 1.030   pH, UA 8.0 (H) 5.0 - 7.5   Color, UA Yellow Yellow   Appearance Ur Cloudy (A) Clear   Leukocytes, UA Negative Negative   Protein, UA Negative Negative/Trace   Glucose, UA Negative Negative   Ketones, UA Negative Negative   RBC, UA Trace (A) Negative   Bilirubin, UA  Negative Negative   Urobilinogen, Ur 0.2 0.2 - 1.0 mg/dL   Nitrite, UA Negative Negative   Microscopic Examination See below:       Assessment & Plan:   Problem List Items Addressed This Visit      Genitourinary   Benign hypertensive renal disease    Referral to cardiology made today for peripheral edema/HTN work up with echo. BP better on recheck. Will check again in 2 weeks at lung recheck. Work on [employer].      Relevant Orders   Ambulatory referral to Cardiology    Other Visit Diagnoses    COPD exacerbation ([place])    -  Primary    Slightly better after neb. Will treat with prednisone, abx, tussionex and tessalon perles. Continue inhalers. Recheck 2 weeks.     Relevant Medications    albuterol (PROVENTIL) (2.5 MG/3ML) 0.083% nebulizer solution 2.5 mg    predniSONE (DELTASONE) 10 MG tablet    benzonatate (TESSALON) 200 MG capsule    chlorpheniramine-HYDROcodone (TUSSIONEX PENNKINETIC ER) 10-8 MG/5ML SUER    Peripheral edema        Concern about CHF given long history of untreated HTN- will check ECHO. Await results. Labs checked today. Referral generated today.    Relevant Orders    CBC with Differential/Platelet    Comprehensive metabolic panel    TSH    Ambulatory referral to Cardiology    Hematuria        On previous exam. Will recheck urine today.    Relevant Orders    UA/M w/rflx Culture, Routine        Follow up plan: Return in about 2 weeks (around 07/31/2015) for Lung recheck.

## 2015-07-17 NOTE — Patient Instructions (Signed)
DASH Eating Plan  DASH stands for "Dietary Approaches to Stop Hypertension." The DASH eating plan is a healthy eating plan that has been shown to reduce high blood pressure (hypertension). Additional health benefits may include reducing the risk of type 2 diabetes mellitus, heart disease, and stroke. The DASH eating plan may also help with weight loss.  WHAT DO I NEED TO KNOW ABOUT THE DASH EATING PLAN?  For the DASH eating plan, you will follow these general guidelines:  · Choose foods with a percent daily value for sodium of less than 5% (as listed on the food label).  · Use salt-free seasonings or herbs instead of table salt or sea salt.  · Check with your health care provider or pharmacist before using salt substitutes.  · Eat lower-sodium products, often labeled as "lower sodium" or "no salt added."  · Eat fresh foods.  · Eat more vegetables, fruits, and low-fat dairy products.  · Choose whole grains. Look for the word "whole" as the first word in the ingredient list.  · Choose fish and skinless chicken or turkey more often than red meat. Limit fish, poultry, and meat to 6 oz (170 g) each day.  · Limit sweets, desserts, sugars, and sugary drinks.  · Choose heart-healthy fats.  · Limit cheese to 1 oz (28 g) per day.  · Eat more home-cooked food and less restaurant, buffet, and fast food.  · Limit fried foods.  · Cook foods using methods other than frying.  · Limit canned vegetables. If you do use them, rinse them well to decrease the sodium.  · When eating at a restaurant, ask that your food be prepared with less salt, or no salt if possible.  WHAT FOODS CAN I EAT?  Seek help from a dietitian for individual calorie needs.  Grains  Whole grain or whole wheat bread. Brown rice. Whole grain or whole wheat pasta. Quinoa, bulgur, and whole grain cereals. Low-sodium cereals. Corn or whole wheat flour tortillas. Whole grain cornbread. Whole grain crackers. Low-sodium crackers.  Vegetables  Fresh or frozen vegetables  (raw, steamed, roasted, or grilled). Low-sodium or reduced-sodium tomato and vegetable juices. Low-sodium or reduced-sodium tomato sauce and paste. Low-sodium or reduced-sodium canned vegetables.   Fruits  All fresh, canned (in natural juice), or frozen fruits.  Meat and Other Protein Products  Ground beef (85% or leaner), grass-fed beef, or beef trimmed of fat. Skinless chicken or turkey. Ground chicken or turkey. Pork trimmed of fat. All fish and seafood. Eggs. Dried beans, peas, or lentils. Unsalted nuts and seeds. Unsalted canned beans.  Dairy  Low-fat dairy products, such as skim or 1% milk, 2% or reduced-fat cheeses, low-fat ricotta or cottage cheese, or plain low-fat yogurt. Low-sodium or reduced-sodium cheeses.  Fats and Oils  Tub margarines without trans fats. Light or reduced-fat mayonnaise and salad dressings (reduced sodium). Avocado. Safflower, olive, or canola oils. Natural peanut or almond butter.  Other  Unsalted popcorn and pretzels.  The items listed above may not be a complete list of recommended foods or beverages. Contact your dietitian for more options.  WHAT FOODS ARE NOT RECOMMENDED?  Grains  White bread. White pasta. White rice. Refined cornbread. Bagels and croissants. Crackers that contain trans fat.  Vegetables  Creamed or fried vegetables. Vegetables in a cheese sauce. Regular canned vegetables. Regular canned tomato sauce and paste. Regular tomato and vegetable juices.  Fruits  Dried fruits. Canned fruit in light or heavy syrup. Fruit juice.  Meat and Other Protein   Products  Fatty cuts of meat. Ribs, chicken wings, bacon, sausage, bologna, salami, chitterlings, fatback, hot dogs, bratwurst, and packaged luncheon meats. Salted nuts and seeds. Canned beans with salt.  Dairy  Whole or 2% milk, cream, half-and-half, and cream cheese. Whole-fat or sweetened yogurt. Full-fat cheeses or blue cheese. Nondairy creamers and whipped toppings. Processed cheese, cheese spreads, or cheese  curds.  Condiments  Onion and garlic salt, seasoned salt, table salt, and sea salt. Canned and packaged gravies. Worcestershire sauce. Tartar sauce. Barbecue sauce. Teriyaki sauce. Soy sauce, including reduced sodium. Steak sauce. Fish sauce. Oyster sauce. Cocktail sauce. Horseradish. Ketchup and mustard. Meat flavorings and tenderizers. Bouillon cubes. Hot sauce. Tabasco sauce. Marinades. Taco seasonings. Relishes.  Fats and Oils  Butter, stick margarine, lard, shortening, ghee, and bacon fat. Coconut, palm kernel, or palm oils. Regular salad dressings.  Other  Pickles and olives. Salted popcorn and pretzels.  The items listed above may not be a complete list of foods and beverages to avoid. Contact your dietitian for more information.  WHERE CAN I FIND MORE INFORMATION?  National Heart, Lung, and [employer] Institute: www.nhlbi.nih.gov/health/health-topics/topics/dash/     This information is not intended to replace advice given to you by your health care provider. Make sure you discuss any questions you have with your health care provider.     Document Released: 05/14/2011 Document Revised: 06/15/2014 Document Reviewed: 03/29/2013  Elsevier Interactive Patient Education ©2016 [employer]

## 2015-07-17 NOTE — Assessment & Plan Note (Signed)
Referral to cardiology made today for peripheral edema/HTN work up with echo. BP better on recheck. Will check again in 2 weeks at lung recheck. Work on [employer].

## 2015-07-18 ENCOUNTER — Telehealth: Payer: Self-pay | Admitting: Family Medicine

## 2015-07-18 LAB — COMPREHENSIVE METABOLIC PANEL
A/G RATIO: 2.1 (ref 1.1–2.5)
ALBUMIN: 4.4 g/dL (ref 3.5–5.5)
ALK PHOS: 51 IU/L (ref 39–117)
ALT: 12 IU/L (ref 0–32)
AST: 16 IU/L (ref 0–40)
BILIRUBIN TOTAL: 0.4 mg/dL (ref 0.0–1.2)
BUN / CREAT RATIO: 19 (ref 9–23)
BUN: 17 mg/dL (ref 6–24)
CHLORIDE: 104 mmol/L (ref 96–106)
CO2: 21 mmol/L (ref 18–29)
Calcium: 9.9 mg/dL (ref 8.7–10.2)
Creatinine, Ser: 0.89 mg/dL (ref 0.57–1.00)
GFR calc non Af Amer: 75 mL/min/{1.73_m2} (ref >59)
GFR, EST AFRICAN AMERICAN: 86 mL/min/{1.73_m2} (ref >59)
GLUCOSE: 84 mg/dL (ref 65–99)
Globulin, Total: 2.1 g/dL (ref 1.5–4.5)
POTASSIUM: 4.6 mmol/L (ref 3.5–5.2)
Sodium: 143 mmol/L (ref 134–144)
TOTAL PROTEIN: 6.5 g/dL (ref 6.0–8.5)

## 2015-07-18 LAB — CBC WITH DIFFERENTIAL/PLATELET
BASOS ABS: 0 10*3/uL (ref 0.0–0.2)
BASOS: 0 %
EOS (ABSOLUTE): 0.2 10*3/uL (ref 0.0–0.4)
Eos: 3 %
HEMOGLOBIN: 12.6 g/dL (ref 11.1–15.9)
Hematocrit: 37.6 % (ref 34.0–46.6)
IMMATURE GRANS (ABS): 0 10*3/uL (ref 0.0–0.1)
Immature Granulocytes: 0 %
LYMPHS ABS: 2 10*3/uL (ref 0.7–3.1)
Lymphs: 31 %
MCH: 29.4 pg (ref 26.6–33.0)
MCHC: 33.5 g/dL (ref 31.5–35.7)
MCV: 88 fL (ref 79–97)
Monocytes Absolute: 0.5 10*3/uL (ref 0.1–0.9)
Monocytes: 8 %
NEUTROS ABS: 3.7 10*3/uL (ref 1.4–7.0)
Neutrophils: 58 %
PLATELETS: 226 10*3/uL (ref 150–379)
RBC: 4.28 x10E6/uL (ref 3.77–5.28)
RDW: 13.7 % (ref 12.3–15.4)
WBC: 6.4 10*3/uL (ref 3.4–10.8)

## 2015-07-18 LAB — TSH: TSH: 0.884 u[IU]/mL (ref 0.450–4.500)

## 2015-07-18 NOTE — Telephone Encounter (Signed)
Please let her know that everything looked nice and normal!

## 2015-07-18 NOTE — Telephone Encounter (Signed)
Patient notified of results.

## 2015-07-29 ENCOUNTER — Ambulatory Visit (INDEPENDENT_AMBULATORY_CARE_PROVIDER_SITE_OTHER): Payer: BLUE CROSS/BLUE SHIELD | Admitting: Family Medicine

## 2015-07-29 ENCOUNTER — Encounter: Payer: Self-pay | Admitting: Family Medicine

## 2015-07-29 VITALS — BP 144/85 | HR 76 | Temp 98.4°F | Ht 63.5 in | Wt 246.0 lb

## 2015-07-29 DIAGNOSIS — H538 Other visual disturbances: Secondary | ICD-10-CM

## 2015-07-29 DIAGNOSIS — J449 Chronic obstructive pulmonary disease, unspecified: Secondary | ICD-10-CM

## 2015-07-29 DIAGNOSIS — K219 Gastro-esophageal reflux disease without esophagitis: Secondary | ICD-10-CM | POA: Diagnosis not present

## 2015-07-29 DIAGNOSIS — R1310 Dysphagia, unspecified: Secondary | ICD-10-CM | POA: Diagnosis not present

## 2015-07-29 DIAGNOSIS — I129 Hypertensive chronic kidney disease with stage 1 through stage 4 chronic kidney disease, or unspecified chronic kidney disease: Secondary | ICD-10-CM

## 2015-07-29 MED ORDER — FLUOXETINE HCL 20 MG PO TABS: 20.0000 mg | ORAL_TABLET | Freq: Every day | ORAL | Status: DC

## 2015-07-29 MED ORDER — LOSARTAN POTASSIUM 100 MG PO TABS: 100.0000 mg | ORAL_TABLET | Freq: Every day | ORAL | Status: DC

## 2015-07-29 MED ORDER — OMEPRAZOLE 40 MG PO CPDR: 40.0000 mg | DELAYED_RELEASE_CAPSULE | Freq: Every day | ORAL | Status: DC

## 2015-07-29 MED ORDER — FLUOXETINE HCL 60 MG PO TABS: 60.0000 mg | ORAL_TABLET | Freq: Every day | ORAL | Status: DC

## 2015-07-29 NOTE — Patient Instructions (Signed)
Menopause and Herbal Products WHAT IS MENOPAUSE? Menopause is the normal time of life when menstrual periods decrease in frequency and eventually stop completely. This process can take several years for some women. Menopause is complete when you have had an absence of menstruation for a full year since your last menstrual period. It usually occurs between the ages of 48 and 55. It is not common for menopause to begin before the age of 40. During menopause, your body stops producing the female hormones estrogen and progesterone. Common symptoms associated with this loss of hormones (vasomotor symptoms) are:  Hot flashes.  Hot flushes.  Night sweats. Other common symptoms and complications of menopause include:  Decrease in sex drive.  Vaginal dryness and thinning of the walls of the vagina. This can make sex painful.  Dryness of the skin and development of wrinkles.  Headaches.  Tiredness.  Irritability.  Memory problems.  Weight gain.  Bladder infections.  Hair growth on the face and chest.  Inability to reproduce offspring (infertility).  Loss of density in the bones (osteoporosis) increasing your risk for breaks (fractures).  Depression.  Hardening and narrowing of the arteries (atherosclerosis). This increases your risk of heart attack and stroke. WHAT TREATMENT OPTIONS ARE AVAILABLE? There are many treatment choices for menopause symptoms. The most common treatment is hormone replacement therapy. Many alternative therapies for menopause are emerging, including the use of herbal products. These supplements can be found in the form of herbs, teas, oils, tinctures, and pills. Common herbal supplements for menopause are made from plants that contain phytoestrogens. Phytoestrogens are compounds that occur naturally in plants and plant products. They act like estrogen in the body. Foods and herbs that contain phytoestrogens include:  Soy.  Flax seeds.  Red  clover.  Ginseng. WHAT MENOPAUSE SYMPTOMS MAY BE HELPED IF I USE HERBAL PRODUCTS?  Vasomotor symptoms. These may be helped by:  Soy. Some studies show that soy may have a moderate benefit for hot flashes.  Black cohosh. There is limited evidence indicating this may be beneficial for hot flashes.  Symptoms that are related to heart and blood vessel disease. These may be helped by soy. Studies have shown that soy can help to lower cholesterol.  Depression. This may be helped by:  St. John's wort. There is limited evidence that shows this may help mild to moderate depression.  Black cohosh. There is evidence that this may help depression and mood swings.  Osteoporosis. Soy may help to decrease bone loss that is associated with menopause and may prevent osteoporosis. Limited evidence indicates that red clover may offer some bone loss protection as well. Other herbal products that are commonly used during menopause lack enough evidence to support their use as a replacement for conventional menopause therapies. These products include evening primrose, ginseng, and red clover. WHAT ARE THE CASES WHEN HERBAL PRODUCTS SHOULD NOT BE USED DURING MENOPAUSE? Do not use herbal products during menopause without your health care provider's approval if:  You are taking medicine.  You have a preexisting liver condition. ARE THERE ANY RISKS IN MY TAKING HERBAL PRODUCTS DURING MENOPAUSE? If you choose to use herbal products to help with symptoms of menopause, keep in mind that:  Different supplements have different and unmeasured amounts of herbal ingredients.  Herbal products are not regulated the same way that medicines are.  Concentrations of herbs may vary depending on the way they are prepared. For example, the concentration may be different in a pill, tea, oil, and tincture.    Little is known about the risks of using herbal products, particularly the risks of long-term use.  Some herbal  supplements can be harmful when combined with certain medicines. Most commonly reported side effects of herbal products are mild. However, if used improperly, many herbal supplements can cause serious problems. Talk to your health care provider before starting any herbal product. If problems develop, stop taking the supplement and let your health care provider know.   This information is not intended to replace advice given to you by your health care provider. Make sure you discuss any questions you have with your health care provider.   Document Released: 11/11/2007 Document Revised: 06/15/2014 Document Reviewed: 11/07/2013 Elsevier Interactive Patient Education 2016 [employer] Inc.  

## 2015-07-29 NOTE — Progress Notes (Signed)
BP 144/85 mmHg  Pulse 76  Temp(Src) 98.4 F (36.9 C)  Ht 5' 3.5" (1.613 m)  Wt 246 lb (111.585 kg)  BMI 42.89 kg/m2  SpO2 98%   Subjective:    Patient ID: Hannah Kaiser, female    DOB: September 28, 1960, 55 y.o.   MRN: [id number]  HPI: Hannah Kaiser is a 55 y.o. female  Chief Complaint  Patient presents with  . COPD  . Hypertension  . Gastroesophageal Reflux   HYPERTENSION Hypertension status: better  Satisfied with current treatment? yes Duration of hypertension: chronic BP monitoring frequency:  rarely BP medication side effects:  no Medication compliance: excellent compliance Aspirin: no Recurrent headaches: yes Visual changes: yes- has trouble seeing at all, sometimes blurs  Palpitations: no Dyspnea: yes Chest pain: no Lower extremity edema: no Dizzy/lightheaded: no  GERD GERD control status: stable  Satisfied with current treatment? yes Heartburn frequency: rarely on the nexium Medication side effects: no  Medication compliance: good Previous GERD medications: nexium Antacid use frequency:  rarely Duration: minutes Nature: burning Location: substernal Heartburn duration: several hours Alleviatiating factors:  Medicine  Aggravating factors: food Dysphagia: yes Odynophagia:  no Hematemesis: no Blood in stool: no EGD: no  COPD COPD status: stable Satisfied with current treatment?: no Oxygen use: no Dyspnea frequency: all the time Cough frequency: couple times a day Rescue inhaler frequency:  rarely Limitation of activity: yes Productive cough: none Pneumovax: Up to Date Influenza: Up to Date  Relevant past medical, surgical, family and social history reviewed and updated as indicated. Interim medical history since our last visit reviewed. Allergies and medications reviewed and updated.  Review of Systems  Constitutional: Positive for chills and diaphoresis. Negative for fever, activity change, appetite change, fatigue and unexpected weight change.   Respiratory: Positive for cough, chest tightness, shortness of breath and wheezing. Negative for apnea, choking and stridor.   Cardiovascular: Positive for leg swelling. Negative for chest pain and palpitations.  Neurological: Positive for headaches.  Psychiatric/Behavioral: Negative.     Per HPI unless specifically indicated above     Objective:    BP 144/85 mmHg  Pulse 76  Temp(Src) 98.4 F (36.9 C)  Ht 5' 3.5" (1.613 m)  Wt 246 lb (111.585 kg)  BMI 42.89 kg/m2  SpO2 98%  Wt Readings from Last 3 Encounters:  07/29/15 246 lb (111.585 kg)  07/17/15 248 lb (112.492 kg)  06/25/15 247 lb (112.038 kg)    Physical Exam  Constitutional: She is oriented to person, place, and time. She appears well-developed and well-nourished. No distress.  HENT:  Head: Normocephalic and atraumatic.  Right Ear: Hearing, tympanic membrane, external ear and ear canal normal.  Left Ear: Hearing and external ear normal.  Nose: Nose normal.  Mouth/Throat: Uvula is midline, oropharynx is clear and moist and mucous membranes are normal. No oropharyngeal exudate.  L EAC occluded by cerumen  Eyes: Conjunctivae, EOM and lids are normal. Pupils are equal, round, and reactive to light. Right eye exhibits no discharge. Left eye exhibits no discharge. No scleral icterus.  Neck: Normal range of motion. Neck supple. No JVD present. No tracheal deviation present. No thyromegaly present.  Cardiovascular: Normal rate, regular rhythm, normal heart sounds and intact distal pulses.  Exam reveals no gallop and no friction rub.   No murmur heard. Pulmonary/Chest: Effort normal. No stridor. No respiratory distress. She has wheezes. She has no rales. She exhibits no tenderness.  Abdominal: Soft. Bowel sounds are normal. She exhibits no distension and no mass.  There is no tenderness. There is no rebound and no guarding.  Musculoskeletal: Normal range of motion. She exhibits edema.  Lymphadenopathy:    She has no cervical  adenopathy.  Neurological: She is alert and oriented to person, place, and time.  Skin: Skin is warm, dry and intact. No rash noted. No erythema. No pallor.  Psychiatric: She has a normal mood and affect. Her speech is normal and behavior is normal. Judgment and thought content normal. Cognition and memory are normal.    Results for orders placed or performed in visit on 07/17/15  CBC with Differential/Platelet  Result Value Ref Range   WBC 6.4 3.4 - 10.8 x10E3/uL   RBC 4.28 3.77 - 5.28 x10E6/uL   Hemoglobin 12.6 11.1 - 15.9 g/dL   Hematocrit 37.6 34.0 - 46.6 %   MCV 88 79 - 97 fL   MCH 29.4 26.6 - 33.0 pg   MCHC 33.5 31.5 - 35.7 g/dL   RDW 13.7 12.3 - 15.4 %   Platelets 226 150 - 379 x10E3/uL   Neutrophils 58 %   Lymphs 31 %   Monocytes 8 %   Eos 3 %   Basos 0 %   Neutrophils Absolute 3.7 1.4 - 7.0 x10E3/uL   Lymphocytes Absolute 2.0 0.7 - 3.1 x10E3/uL   Monocytes Absolute 0.5 0.1 - 0.9 x10E3/uL   EOS (ABSOLUTE) 0.2 0.0 - 0.4 x10E3/uL   Basophils Absolute 0.0 0.0 - 0.2 x10E3/uL   Immature Granulocytes 0 %   Immature Grans (Abs) 0.0 0.0 - 0.1 x10E3/uL  Comprehensive metabolic panel  Result Value Ref Range   Glucose 84 65 - 99 mg/dL   BUN 17 6 - 24 mg/dL   Creatinine, Ser 0.89 0.57 - 1.00 mg/dL   GFR calc non Af Amer 75 >59 mL/min/1.73   GFR calc Af Amer 86 >59 mL/min/1.73   BUN/Creatinine Ratio 19 9 - 23   Sodium 143 134 - 144 mmol/L   Potassium 4.6 3.5 - 5.2 mmol/L   Chloride 104 96 - 106 mmol/L   CO2 21 18 - 29 mmol/L   Calcium 9.9 8.7 - 10.2 mg/dL   Total Protein 6.5 6.0 - 8.5 g/dL   Albumin 4.4 3.5 - 5.5 g/dL   Globulin, Total 2.1 1.5 - 4.5 g/dL   Albumin/Globulin Ratio 2.1 1.1 - 2.5   Bilirubin Total 0.4 0.0 - 1.2 mg/dL   Alkaline Phosphatase 51 39 - 117 IU/L   AST 16 0 - 40 IU/L   ALT 12 0 - 32 IU/L  TSH  Result Value Ref Range   TSH 0.884 0.450 - 4.500 uIU/mL  UA/M w/rflx Culture, Routine  Result Value Ref Range   Specific Gravity, UA 1.015 1.005 - 1.030    pH, UA 7.0 5.0 - 7.5   Color, UA Yellow Yellow   Appearance Ur Clear Clear   Leukocytes, UA Negative Negative   Protein, UA Negative Negative/Trace   Glucose, UA Negative Negative   Ketones, UA Negative Negative   RBC, UA Negative Negative   Bilirubin, UA Negative Negative   Urobilinogen, Ur 0.2 0.2 - 1.0 mg/dL   Nitrite, UA Negative Negative      Assessment & Plan:   Problem List Items Addressed This Visit      Respiratory   COPD (chronic obstructive pulmonary disease) (HCC) - Primary    Exacerbation resolved. Still having some symptoms. Spirometry normal today. Sample of anoro given today. If she likes it, will change. Continue to monitor.  Relevant Orders   Spirometry with Graph (Completed)     Digestive   GERD (gastroesophageal reflux disease)    Stable on current medicine, but having some dysphagia. Continue omeprazole. Continue to monitor. Refill given. Referral to GI made today. Likely will need EGD to r/o stricture      Relevant Medications   omeprazole (PRILOSEC) 40 MG capsule   Other Relevant Orders   Ambulatory referral to Gastroenterology     Genitourinary   Benign hypertensive renal disease    Other Visit Diagnoses    Blurred vision        Concerned about MS. Referral to neurology pending. Will refer to opthalmology to r/o eye issues.     Relevant Orders    Ambulatory referral to Ophthalmology    Dysphagia        Referral to GI made today. Continue omeprazole. Continue to monitor.     Relevant Orders    Ambulatory referral to Gastroenterology        Follow up plan: Return 1-2 months.

## 2015-07-29 NOTE — Assessment & Plan Note (Signed)
Exacerbation resolved. Still having some symptoms. Spirometry normal today. Sample of anoro given today. If she likes it, will change. Continue to monitor.

## 2015-07-29 NOTE — Assessment & Plan Note (Signed)
Stable on current medicine, but having some dysphagia. Continue omeprazole. Continue to monitor. Refill given. Referral to GI made today. Likely will need EGD to r/o stricture

## 2015-08-15 ENCOUNTER — Telehealth: Payer: Self-pay | Admitting: Family Medicine

## 2015-08-15 MED ORDER — UMECLIDINIUM-VILANTEROL 62.5-25 MCG/INH IN AEPB: 1.0000 | INHALATION_SPRAY | Freq: Every day | RESPIRATORY_TRACT | Status: DC

## 2015-08-15 NOTE — Telephone Encounter (Signed)
Likes the [employer]. Would like to change to that. Rx sent to her pharmacy.

## 2015-08-19 ENCOUNTER — Other Ambulatory Visit: Payer: Self-pay | Admitting: Neurology

## 2015-08-19 DIAGNOSIS — R200 Anesthesia of skin: Secondary | ICD-10-CM

## 2015-08-19 DIAGNOSIS — M542 Cervicalgia: Secondary | ICD-10-CM

## 2015-09-04 ENCOUNTER — Ambulatory Visit: Payer: BLUE CROSS/BLUE SHIELD | Attending: Neurology

## 2015-09-04 DIAGNOSIS — G4710 Hypersomnia, unspecified: Secondary | ICD-10-CM | POA: Diagnosis present

## 2015-09-04 DIAGNOSIS — G4761 Periodic limb movement disorder: Secondary | ICD-10-CM | POA: Insufficient documentation

## 2015-09-04 DIAGNOSIS — G4733 Obstructive sleep apnea (adult) (pediatric): Secondary | ICD-10-CM | POA: Insufficient documentation

## 2015-09-06 ENCOUNTER — Ambulatory Visit
Admission: RE | Admit: 2015-09-06 | Discharge: 2015-09-06 | Disposition: A | Discharge status: Home / Self Care | Payer: BLUE CROSS/BLUE SHIELD | Source: Ambulatory Visit | Attending: Neurology | Admitting: Neurology

## 2015-09-06 DIAGNOSIS — Z9889 Other specified postprocedural states: Secondary | ICD-10-CM | POA: Insufficient documentation

## 2015-09-06 DIAGNOSIS — M4802 Spinal stenosis, cervical region: Secondary | ICD-10-CM | POA: Insufficient documentation

## 2015-09-06 DIAGNOSIS — M50322 Other cervical disc degeneration at C5-C6 level: Secondary | ICD-10-CM | POA: Insufficient documentation

## 2015-09-06 DIAGNOSIS — M4806 Spinal stenosis, lumbar region: Secondary | ICD-10-CM | POA: Diagnosis not present

## 2015-09-06 DIAGNOSIS — R200 Anesthesia of skin: Secondary | ICD-10-CM | POA: Insufficient documentation

## 2015-09-06 DIAGNOSIS — M5126 Other intervertebral disc displacement, lumbar region: Secondary | ICD-10-CM | POA: Insufficient documentation

## 2015-09-06 DIAGNOSIS — M542 Cervicalgia: Secondary | ICD-10-CM

## 2015-09-06 MED ORDER — GADOBENATE DIMEGLUMINE 529 MG/ML IV SOLN
20.0000 mL | Freq: Once | INTRAVENOUS | Status: AC | PRN
  Administered 2015-09-06: 20 mL via INTRAVENOUS

## 2015-09-09 ENCOUNTER — Other Ambulatory Visit: Payer: Self-pay

## 2015-09-09 ENCOUNTER — Encounter (INDEPENDENT_AMBULATORY_CARE_PROVIDER_SITE_OTHER): Payer: Self-pay

## 2015-09-09 ENCOUNTER — Encounter: Payer: Self-pay | Admitting: Gastroenterology

## 2015-09-09 ENCOUNTER — Ambulatory Visit (INDEPENDENT_AMBULATORY_CARE_PROVIDER_SITE_OTHER): Payer: BLUE CROSS/BLUE SHIELD | Admitting: Gastroenterology

## 2015-09-09 VITALS — BP 139/83 | HR 78 | Temp 98.1°F | Ht 63.5 in | Wt 244.0 lb

## 2015-09-09 DIAGNOSIS — R1310 Dysphagia, unspecified: Secondary | ICD-10-CM | POA: Diagnosis not present

## 2015-09-09 NOTE — Progress Notes (Signed)
Gastroenterology Consultation  Referring Provider:     Valerie Roys, DO Primary Care Physician:  Park Liter, DO Primary Gastroenterologist:  Dr. Allen Norris     Reason for Consultation:     Dysphagia        HPI:   Hannah Kaiser is a 55 y.o. y/o female referred for consultation & management of Dysphagia. This patient comes in today with a report of dysphagia. The patient states that she has more problems with solids. She also reports that she has had with swallowing pills. Is no report of any unexplained weight loss. The patient also states that she had an EGD and colonoscopy 3 years ago and was told that she had a rectal polyp that needed a repeat colonoscopy in 3 years. The patient has not had any black stools or bloody stools. The patient also denies any family history of colon cancer colon polyps. There is no report of the patient having to vomit up any of her food.  Past Medical History  Diagnosis Date  . GERD (gastroesophageal reflux disease)   . Colon polyps   . Arthritis   . History of blood transfusion   . Breast pain   . Nipple discharge   . Depression     Past Surgical History  Procedure Laterality Date  . Abdominal hysterectomy  2014  . Spine surgery  1989, 1998  . Neck surgery  2003    Prior to Admission medications   Medication Sig Start Date End Date Taking? Authorizing Provider  albuterol (PROVENTIL HFA;VENTOLIN HFA) 108 (90 BASE) MCG/ACT inhaler Inhale 2 puffs into the lungs every 6 (six) hours as needed for wheezing or shortness of breath.   Yes Historical Provider, MD  amLODipine (NORVASC) 5 MG tablet Take 1 tablet (5 mg total) by mouth daily. 06/25/15  Yes Megan P Johnson, DO  atenolol (TENORMIN) 25 MG tablet Take 0.5 tablets (12.5 mg total) by mouth daily. 04/12/15  Yes Megan P Johnson, DO  benzonatate (TESSALON) 200 MG capsule Take 1 capsule (200 mg total) by mouth 3 (three) times daily as needed for cough. 07/17/15  Yes Megan P Johnson, DO  DULoxetine  (CYMBALTA) 20 MG capsule Take 40 mg by mouth daily. 09/02/15  Yes Historical Provider, MD  FLUoxetine (PROZAC) 20 MG tablet Take 1 tablet (20 mg total) by mouth daily. 07/29/15  Yes Megan P Johnson, DO  FLUoxetine HCl 60 MG TABS Take 60 mg by mouth daily. 07/29/15  Yes Megan P Johnson, DO  hydrochlorothiazide (HYDRODIURIL) 25 MG tablet Take 1 tablet (25 mg total) by mouth daily. 03/13/15  Yes Megan P Johnson, DO  losartan (COZAAR) 100 MG tablet Take 1 tablet (100 mg total) by mouth daily. 07/29/15  Yes Megan P Johnson, DO  nystatin-triamcinolone ointment (MYCOLOG) Apply 1 application topically 2 (two) times daily. 06/25/15  Yes Megan P Johnson, DO  omeprazole (PRILOSEC) 40 MG capsule Take 1 capsule (40 mg total) by mouth daily. 07/29/15  Yes Megan P Johnson, DO  Vitamin D, Ergocalciferol, (DRISDOL) 50000 units CAPS capsule take 1 capsule by mouth every week for 8 DOSES 08/15/15  Yes Historical Provider, MD  chlorpheniramine-HYDROcodone (TUSSIONEX PENNKINETIC ER) 10-8 MG/5ML SUER Take 5 mLs by mouth at bedtime as needed. Patient not taking: Reported on 09/09/2015 07/17/15   Valerie Roys, DO  gabapentin (NEURONTIN) 300 MG capsule Reported on 09/09/2015 05/21/15   Historical Provider, MD  LORazepam (ATIVAN) 1 MG tablet Reported on 09/09/2015 09/05/15   Historical Provider, MD  traMADol (ULTRAM) 50 MG tablet Take 1 tablet (50 mg total) by mouth every 6 (six) hours as needed. Patient not taking: Reported on 07/17/2015 06/19/15 06/18/16  Hinda Kehr, MD  umeclidinium-vilanterol [facility] ELLIPTA) 62.5-25 MCG/INH AEPB Inhale 1 puff into the lungs daily. Patient not taking: Reported on 09/09/2015 08/15/15   Valerie Roys, DO    Family History  Problem Relation Age of Onset  . Cancer Brother     melanoma  . Cancer Maternal Grandfather     prostate  . Cancer Maternal Uncle     lung     Social History  Substance Use Topics  . Smoking status: Current Every Day Smoker -- 1.00 packs/day for 37 years  . Smokeless tobacco:  Never Used  . Alcohol Use: No    Allergies as of 09/09/2015 - Review Complete 09/09/2015  Allergen Reaction Noted  . Ace inhibitors Cough 03/13/2015  . Azithromycin Rash 03/13/2015  . Erythromycin Rash 08/21/2013    Review of Systems:    All systems reviewed and negative except where noted in HPI.   Physical Exam:  BP 139/83 mmHg  Pulse 78  Temp(Src) 98.1 F (36.7 C) (Oral)  Ht 5' 3.5" (1.613 m)  Wt 244 lb (110.678 kg)  BMI 42.54 kg/m2 No LMP recorded. Patient has had a hysterectomy. Psych:  Alert and cooperative. Normal mood and affect. General:   Alert,  Well-developed, well-nourished, pleasant and cooperative in NAD Head:  Normocephalic and atraumatic. Eyes:  Sclera clear, no icterus.   Conjunctiva pink. Ears:  Normal auditory acuity. Nose:  No deformity, discharge, or lesions. Mouth:  No deformity or lesions,oropharynx pink & moist. Neck:  Supple; no masses or thyromegaly. Lungs:  Respirations even and unlabored.  Clear throughout to auscultation.   No wheezes, crackles, or rhonchi. No acute distress. Heart:  Regular rate and rhythm; no murmurs, clicks, rubs, or gallops. Abdomen:  Normal bowel sounds.  No bruits.  Soft, non-tender and non-distended without masses, hepatosplenomegaly or hernias noted.  No guarding or rebound tenderness.  Negative Carnett sign.   Rectal:  Deferred.  Msk:  Symmetrical without gross deformities.  Good, equal movement & strength bilaterally. Pulses:  Normal pulses noted. Extremities:  No clubbing or edema.  No cyanosis. Neurologic:  Alert and oriented x3;  grossly normal neurologically. Skin:  Intact without significant lesions or rashes.  No jaundice. Lymph Nodes:  No significant cervical adenopathy. Psych:  Alert and cooperative. Normal mood and affect.  Imaging Studies: Mr Cervical Spine W Wo Contrast  09/06/2015  CLINICAL DATA:  Cervicalgia. Neck pain and bilateral arm pain with numbness and tingling. Weakness in the hands and arms,  worse on the left. Symptoms present for 9 months. Prior cervical spine surgery. EXAM: MRI CERVICAL SPINE WITHOUT AND WITH CONTRAST TECHNIQUE: Multiplanar and multiecho pulse sequences of the cervical spine, to include the craniocervical junction and cervicothoracic junction, were obtained according to standard protocol without and with intravenous contrast. CONTRAST:  100mL MULTIHANCE GADOBENATE DIMEGLUMINE 529 MG/ML IV SOLN COMPARISON:  None. FINDINGS: There is straightening of the normal cervical lordosis, and there is trace retrolisthesis of C4 on C5. Sequelae of prior C6-7 ACDF are identified with evidence of solid osseous fusion. Disc space narrowing is mild at C3-4 and C4-5 and moderate to severe at C5-6 and C7-T1. Mild degenerative endplate edema and enhancement are present at C3-4. The craniocervical junction is unremarkable. The cervical spinal cord is normal in signal. Paraspinal soft tissues are unremarkable. C2-3: Minimal disc bulging, right uncovertebral  spurring, and advanced right facet arthrosis result in mild right neural foraminal stenosis without spinal stenosis. C3-4: Broad-based posterior disc osteophyte complex and severe left facet arthrosis result in mild spinal stenosis and mild right and moderate left neural foraminal stenosis. C4-5: Broad-based posterior disc osteophyte complex and mild facet arthrosis result in mild spinal stenosis and mild right and mild-to-moderate left neural foraminal stenosis. C5-6: Moderate-sized central disc protrusion and uncovertebral hypertrophy result in moderate spinal stenosis with mild cord flattening and severe right and mild-to-moderate left neural foraminal stenosis. C6-7:  Prior ACDF.  No stenosis. C7-T1: Broad-based posterior disc osteophyte complex and moderate left facet arthrosis result in mild left neural foraminal stenosis without spinal stenosis. IMPRESSION: 1. Advanced disc degeneration at C5-6 with moderate spinal stenosis and severe right and  mild-to-moderate left neural foraminal stenosis. 2. Mild spinal stenosis and moderate left neural foraminal stenosis at C3-4. 3. Mild left foraminal stenosis at C7-T1. Electronically Signed   By: Logan Bores M.D.   On: 09/06/2015 16:21   Mr Lumbar Spine W Wo Contrast  09/06/2015  CLINICAL DATA:  Low back pain, will bilateral leg pain and weakness EXAM: MRI LUMBAR SPINE WITHOUT AND WITH CONTRAST TECHNIQUE: Multiplanar and multiecho pulse sequences of the lumbar spine were obtained without and with intravenous contrast. CONTRAST:  20 mL MultiHance COMPARISON:  None. FINDINGS: Segmentation: Conventional anatomy assistant, with the last open disc space designated L5-S1. Alignment: 3 mm of retrolisthesis of L3 on L4. Bones: The vertebral bodies of the lumbar spine are normal in size. There is normal bone marrow signal demonstrated throughout the vertebra. The visualized portions of the SI joints are unremarkable. Conus medullaris: Extends to the L1 level and appears normal. The nerve roots of the cauda equina and the filum terminale are normal. Paraspinal and other soft tissues: There is no focal abnormality. The imaged intra-abdominal contents are unremarkable. Disc levels: Disc spaces: Degenerative disc disease with disc height loss at L1-2. Posterior lumbar interbody fusion at L5-S1. T12-L1: No significant disc bulge. No evidence of neural foraminal stenosis. No central canal stenosis. L1-L2: Broad-based disc bulge flattening the ventral thecal sac. No evidence of neural foraminal stenosis. No central canal stenosis. L2-L3: No significant disc bulge. No evidence of neural foraminal stenosis. No central canal stenosis. L3-L4: Broad-based disc bulge with a a right foraminal/ lateral disc protrusion. Right lateral recess narrowing. Mild bilateral facet arthropathy and mild spinal stenosis. No foraminal stenosis. L4-L5: Moderate broad-based disc bulge with a left foraminal disc protrusion resulting in severe left  foraminal stenosis. Bilateral facet arthropathy and moderate spinal stenosis. L5-S1: Obscured by susceptibility artifact resulting from the orthopedic hardware. IMPRESSION: 1. At L4-5 there is a moderate broad-based disc bulge with a left foraminal disc protrusion resulting in severe left foraminal stenosis. Bilateral facet arthropathy and moderate spinal stenosis. 2. Posterior lumbar interbody fusion at L5-S1. Spinal canal and foramen is are obscured by susceptibility artifact resulting from the orthopedic hardware. Electronically Signed   By: Kathreen Devoid   On: 09/06/2015 16:07    Assessment and Plan:   Hannah Kaiser is a 55 y.o. y/o female who comes in today with a history of colon polyps and a report of dysphagia. The patient has dysphagia to solids and pills. The patient will be set up for an EGD and colonoscopy. I have discussed risks & benefits which include, but are not limited to, bleeding, infection, perforation & drug reaction.  The patient agrees with this plan & written consent will be obtained.  Note: This dictation was prepared with Dragon dictation along with smaller phrase technology. Any transcriptional errors that result from this process are unintentional.

## 2015-09-10 ENCOUNTER — Other Ambulatory Visit: Payer: Self-pay

## 2015-09-12 ENCOUNTER — Other Ambulatory Visit: Payer: Self-pay | Admitting: Family Medicine

## 2015-09-18 ENCOUNTER — Encounter: Payer: Self-pay | Admitting: Anesthesiology

## 2015-09-18 ENCOUNTER — Encounter: Payer: Self-pay | Admitting: *Deleted

## 2015-09-23 ENCOUNTER — Ambulatory Visit
Admission: RE | Admit: 2015-09-23 | Payer: BLUE CROSS/BLUE SHIELD | Source: Ambulatory Visit | Admitting: Gastroenterology

## 2015-09-23 HISTORY — DX: Cardiac murmur, unspecified: R01.1

## 2015-09-23 HISTORY — DX: Anesthesia of skin: R20.0

## 2015-09-23 HISTORY — DX: Other specified postprocedural states: Z98.890

## 2015-09-23 HISTORY — DX: Essential (primary) hypertension: I10

## 2015-09-23 HISTORY — DX: Nausea with vomiting, unspecified: R11.2

## 2015-09-23 HISTORY — DX: Dorsopathy, unspecified: M53.9

## 2015-09-23 HISTORY — DX: Motion sickness, initial encounter: T75.3XXA

## 2015-09-23 HISTORY — DX: Chronic obstructive pulmonary disease, unspecified: J44.9

## 2015-09-23 HISTORY — DX: Sleep apnea, unspecified: G47.30

## 2015-09-23 HISTORY — DX: Simple chronic bronchitis: J41.0

## 2015-09-23 HISTORY — DX: Presence of dental prosthetic device (complete) (partial): Z97.2

## 2015-09-23 SURGERY — COLONOSCOPY WITH PROPOFOL
Anesthesia: Choice

## 2015-10-17 ENCOUNTER — Ambulatory Visit
Admission: RE | Admit: 2015-10-17 | Discharge: 2015-10-17 | Disposition: A | Discharge status: Home / Self Care | Payer: BLUE CROSS/BLUE SHIELD | Source: Ambulatory Visit | Attending: Family Medicine | Admitting: Family Medicine

## 2015-10-17 ENCOUNTER — Encounter: Payer: Self-pay | Admitting: Family Medicine

## 2015-10-17 ENCOUNTER — Ambulatory Visit (INDEPENDENT_AMBULATORY_CARE_PROVIDER_SITE_OTHER): Payer: BLUE CROSS/BLUE SHIELD | Admitting: Family Medicine

## 2015-10-17 VITALS — BP 134/82 | HR 65 | Temp 98.9°F | Wt 249.0 lb

## 2015-10-17 DIAGNOSIS — R609 Edema, unspecified: Secondary | ICD-10-CM | POA: Diagnosis not present

## 2015-10-17 DIAGNOSIS — N9489 Other specified conditions associated with female genital organs and menstrual cycle: Secondary | ICD-10-CM

## 2015-10-17 DIAGNOSIS — B9689 Other specified bacterial agents as the cause of diseases classified elsewhere: Secondary | ICD-10-CM

## 2015-10-17 DIAGNOSIS — A499 Bacterial infection, unspecified: Secondary | ICD-10-CM | POA: Diagnosis not present

## 2015-10-17 DIAGNOSIS — R918 Other nonspecific abnormal finding of lung field: Secondary | ICD-10-CM | POA: Diagnosis not present

## 2015-10-17 DIAGNOSIS — N760 Acute vaginitis: Secondary | ICD-10-CM

## 2015-10-17 DIAGNOSIS — B372 Candidiasis of skin and nail: Secondary | ICD-10-CM | POA: Diagnosis not present

## 2015-10-17 DIAGNOSIS — J449 Chronic obstructive pulmonary disease, unspecified: Secondary | ICD-10-CM | POA: Insufficient documentation

## 2015-10-17 DIAGNOSIS — N898 Other specified noninflammatory disorders of vagina: Secondary | ICD-10-CM

## 2015-10-17 LAB — UA/M W/RFLX CULTURE, ROUTINE
Bilirubin, UA: NEGATIVE
Glucose, UA: NEGATIVE
Ketones, UA: NEGATIVE
LEUKOCYTES UA: NEGATIVE
NITRITE UA: NEGATIVE
PH UA: 7 (ref 5.0–7.5)
PROTEIN UA: NEGATIVE
RBC, UA: NEGATIVE
SPEC GRAV UA: 1.015 (ref 1.005–1.030)
Urobilinogen, Ur: 0.2 mg/dL (ref 0.2–1.0)

## 2015-10-17 LAB — WET PREP FOR TRICH, YEAST, CLUE
CLUE CELL EXAM: POSITIVE — AB
TRICHOMONAS EXAM: NEGATIVE
YEAST EXAM: NEGATIVE

## 2015-10-17 MED ORDER — METRONIDAZOLE 500 MG PO TABS: 500.0000 mg | ORAL_TABLET | Freq: Two times a day (BID) | ORAL | Status: DC

## 2015-10-17 MED ORDER — FUROSEMIDE 20 MG PO TABS: 20.0000 mg | ORAL_TABLET | Freq: Every day | ORAL | Status: DC

## 2015-10-17 MED ORDER — NYSTATIN 100000 UNIT/GM EX POWD: Freq: Four times a day (QID) | CUTANEOUS | Status: DC

## 2015-10-17 NOTE — Progress Notes (Signed)
BP 134/82 mmHg  Pulse 65  Temp(Src) 98.9 F (37.2 C)  Wt 249 lb (112.946 kg)  SpO2 97%   Subjective:    Patient ID: Hannah Kaiser, female    DOB: 07-13-1960, 55 y.o.   MRN: [id number]  HPI: Hannah Kaiser is a 55 y.o. female  Chief Complaint  Patient presents with  . Rash  . vaginal odor   RASH Duration:  > 58months  Location: groin  Itching: yes Burning: yes Redness: yes Oozing: yes Scaling: yes Blisters: no Painful: yes Fevers: no Change in detergents/soaps/personal care products: no Recent illness: no Recent travel:no History of same: no Context: worse Alleviating factors: nothing Treatments attempted: nystatin-triamcinalone Shortness of breath: yes Throat/tongue swelling: no Myalgias/arthralgias: no  VAGINAL ODOR Duration: weeks Discharge description: nothing to speak off  Pruritus: yes Dysuria: yes Malodorous: yes Urinary frequency: yes Fevers: no Abdominal pain: no  Sexual activity: not sexually active History of sexually transmitted diseases: no Recent antibiotic use: no Context: stable  Treatments attempted: none  Peripheral edema- gaining a whole lot of weight and losing it, peeing it out and putting it back on. Weight has fluctuated up to 15 lbs a day in the past week or so. Quite concerned about this.   Relevant past medical, surgical, family and social history reviewed and updated as indicated. Interim medical history since our last visit reviewed. Allergies and medications reviewed and updated.  Review of Systems  Constitutional: Positive for unexpected weight change. Negative for fever, chills, diaphoresis, activity change, appetite change and fatigue.  Respiratory: Positive for shortness of breath. Negative for apnea, cough, choking, chest tightness, wheezing and stridor.   Cardiovascular: Positive for leg swelling.  Genitourinary: Positive for dysuria, urgency and frequency. Negative for flank pain, vaginal bleeding, vaginal discharge,  enuresis, difficulty urinating, genital sores, vaginal pain, menstrual problem, pelvic pain and dyspareunia.  Psychiatric/Behavioral: Negative.     Per HPI unless specifically indicated above     Objective:    BP 134/82 mmHg  Pulse 65  Temp(Src) 98.9 F (37.2 C)  Wt 249 lb (112.946 kg)  SpO2 97%  Wt Readings from Last 3 Encounters:  10/17/15 249 lb (112.946 kg)  09/18/15 244 lb (110.678 kg)  09/09/15 244 lb (110.678 kg)    Physical Exam  Constitutional: She is oriented to person, place, and time. She appears well-developed and well-nourished. No distress.  HENT:  Head: Normocephalic and atraumatic.  Right Ear: Hearing normal.  Left Ear: Hearing normal.  Nose: Nose normal.  Eyes: Conjunctivae and lids are normal. Right eye exhibits no discharge. Left eye exhibits no discharge. No scleral icterus.  Cardiovascular: Normal rate, regular rhythm, normal heart sounds and intact distal pulses.  Exam reveals no gallop and no friction rub.   No murmur heard. Pulmonary/Chest: Effort normal and breath sounds normal. No respiratory distress. She has no wheezes. She has no rales. She exhibits no tenderness.  Genitourinary:    No erythema, tenderness or bleeding in the vagina. No foreign body around the vagina. No signs of injury around the vagina. Vaginal discharge found.  Musculoskeletal: Normal range of motion. She exhibits edema (trace bilaterally).  Neurological: She is alert and oriented to person, place, and time.  Skin: Skin is intact. No rash noted. She is not diaphoretic.  Psychiatric: She has a normal mood and affect. Her speech is normal and behavior is normal. Judgment and thought content normal. Cognition and memory are normal.  Nursing note and vitals reviewed.   Results for orders  placed or performed in visit on 07/17/15  CBC with Differential/Platelet  Result Value Ref Range   WBC 6.4 3.4 - 10.8 x10E3/uL   RBC 4.28 3.77 - 5.28 x10E6/uL   Hemoglobin 12.6 11.1 - 15.9  g/dL   Hematocrit 37.6 34.0 - 46.6 %   MCV 88 79 - 97 fL   MCH 29.4 26.6 - 33.0 pg   MCHC 33.5 31.5 - 35.7 g/dL   RDW 13.7 12.3 - 15.4 %   Platelets 226 150 - 379 x10E3/uL   Neutrophils 58 %   Lymphs 31 %   Monocytes 8 %   Eos 3 %   Basos 0 %   Neutrophils Absolute 3.7 1.4 - 7.0 x10E3/uL   Lymphocytes Absolute 2.0 0.7 - 3.1 x10E3/uL   Monocytes Absolute 0.5 0.1 - 0.9 x10E3/uL   EOS (ABSOLUTE) 0.2 0.0 - 0.4 x10E3/uL   Basophils Absolute 0.0 0.0 - 0.2 x10E3/uL   Immature Granulocytes 0 %   Immature Grans (Abs) 0.0 0.0 - 0.1 x10E3/uL  Comprehensive metabolic panel  Result Value Ref Range   Glucose 84 65 - 99 mg/dL   BUN 17 6 - 24 mg/dL   Creatinine, Ser 0.89 0.57 - 1.00 mg/dL   GFR calc non Af Amer 75 >59 mL/min/1.73   GFR calc Af Amer 86 >59 mL/min/1.73   BUN/Creatinine Ratio 19 9 - 23   Sodium 143 134 - 144 mmol/L   Potassium 4.6 3.5 - 5.2 mmol/L   Chloride 104 96 - 106 mmol/L   CO2 21 18 - 29 mmol/L   Calcium 9.9 8.7 - 10.2 mg/dL   Total Protein 6.5 6.0 - 8.5 g/dL   Albumin 4.4 3.5 - 5.5 g/dL   Globulin, Total 2.1 1.5 - 4.5 g/dL   Albumin/Globulin Ratio 2.1 1.1 - 2.5   Bilirubin Total 0.4 0.0 - 1.2 mg/dL   Alkaline Phosphatase 51 39 - 117 IU/L   AST 16 0 - 40 IU/L   ALT 12 0 - 32 IU/L  TSH  Result Value Ref Range   TSH 0.884 0.450 - 4.500 uIU/mL  UA/M w/rflx Culture, Routine  Result Value Ref Range   Specific Gravity, UA 1.015 1.005 - 1.030   pH, UA 7.0 5.0 - 7.5   Color, UA Yellow Yellow   Appearance Ur Clear Clear   Leukocytes, UA Negative Negative   Protein, UA Negative Negative/Trace   Glucose, UA Negative Negative   Ketones, UA Negative Negative   RBC, UA Negative Negative   Bilirubin, UA Negative Negative   Urobilinogen, Ur 0.2 0.2 - 1.0 mg/dL   Nitrite, UA Negative Negative      Assessment & Plan:   Problem List Items Addressed This Visit    None    Visit Diagnoses    Peripheral edema    -  Primary    Obtain CXR- check ECHO from Dr. Humphrey Rolls,  checking labs. Lasix prn. Call with concerns.     Relevant Orders    Comprehensive metabolic panel    B Nat Peptide    UA/M w/rflx Culture, Routine    DG Chest 2 View    Vaginal odor        +clue cells    Relevant Orders    WET PREP FOR TRICH, YEAST, CLUE    UA/M w/rflx Culture, Routine    BV (bacterial vaginosis)        Will treat with metronidazole. Call if not getting better.    Relevant Medications  nystatin (NYSTATIN) powder    metroNIDAZOLE (FLAGYL) 500 MG tablet    Candidal intertrigo        Too moist for cream, will start powder. Call if not getting better.     Relevant Medications    nystatin (NYSTATIN) powder    metroNIDAZOLE (FLAGYL) 500 MG tablet        Follow up plan: Return Pending results. Marland Kitchen

## 2015-10-18 LAB — COMPREHENSIVE METABOLIC PANEL
ALBUMIN: 4.4 g/dL (ref 3.5–5.5)
ALK PHOS: 59 IU/L (ref 39–117)
ALT: 10 IU/L (ref 0–32)
AST: 11 IU/L (ref 0–40)
Albumin/Globulin Ratio: 2.1 (ref 1.2–2.2)
BUN / CREAT RATIO: 12 (ref 9–23)
BUN: 11 mg/dL (ref 6–24)
Bilirubin Total: 0.3 mg/dL (ref 0.0–1.2)
CALCIUM: 9.3 mg/dL (ref 8.7–10.2)
CO2: 22 mmol/L (ref 18–29)
CREATININE: 0.91 mg/dL (ref 0.57–1.00)
Chloride: 101 mmol/L (ref 96–106)
GFR, EST AFRICAN AMERICAN: 83 mL/min/{1.73_m2} (ref >59)
GFR, EST NON AFRICAN AMERICAN: 72 mL/min/{1.73_m2} (ref >59)
GLOBULIN, TOTAL: 2.1 g/dL (ref 1.5–4.5)
GLUCOSE: 92 mg/dL (ref 65–99)
Potassium: 4.2 mmol/L (ref 3.5–5.2)
SODIUM: 139 mmol/L (ref 134–144)
TOTAL PROTEIN: 6.5 g/dL (ref 6.0–8.5)

## 2015-10-18 LAB — BRAIN NATRIURETIC PEPTIDE: BNP: 45.3 pg/mL (ref 0.0–100.0)

## 2015-11-06 ENCOUNTER — Other Ambulatory Visit: Payer: Self-pay | Admitting: Family Medicine

## 2015-11-12 ENCOUNTER — Encounter: Payer: Self-pay | Admitting: Family Medicine

## 2015-11-12 ENCOUNTER — Ambulatory Visit (INDEPENDENT_AMBULATORY_CARE_PROVIDER_SITE_OTHER): Payer: BLUE CROSS/BLUE SHIELD | Admitting: Family Medicine

## 2015-11-12 VITALS — BP 130/81 | HR 65 | Temp 99.1°F | Wt 240.0 lb

## 2015-11-12 DIAGNOSIS — R21 Rash and other nonspecific skin eruption: Secondary | ICD-10-CM

## 2015-11-12 DIAGNOSIS — R232 Flushing: Secondary | ICD-10-CM

## 2015-11-12 DIAGNOSIS — N951 Menopausal and female climacteric states: Secondary | ICD-10-CM | POA: Diagnosis not present

## 2015-11-12 DIAGNOSIS — E162 Hypoglycemia, unspecified: Secondary | ICD-10-CM

## 2015-11-12 LAB — BAYER DCA HB A1C WAIVED: HB A1C: 5.9 % (ref <7.0)

## 2015-11-12 MED ORDER — VALACYCLOVIR HCL 1 G PO TABS: 1000.0000 mg | ORAL_TABLET | Freq: Two times a day (BID) | ORAL | Status: DC

## 2015-11-12 NOTE — Progress Notes (Signed)
BP 130/81 mmHg  Pulse 65  Temp(Src) 99.1 F (37.3 C)  Wt 240 lb (108.863 kg)  SpO2 98%   Subjective:    Patient ID: Hannah Kaiser, female    DOB: [birth year], 55 y.o.   MRN: [id number]  HPI: Hannah Kaiser is a 55 y.o. female  Chief Complaint  Patient presents with  . skin lesions    Patient has non-healing bumps on her upper thighs, she would like to have them cultured  . Vaginal Itching    Patient states that she is still having the issues in the vaginal area  . Hypoglycemia    Patient is concerned that her bood sugar may be dropping, she would like to have this checked.  Marland Kitchen Hot Flashes    Patient would like to know if there is anything that she can be given to help with this, she states that they are worse than they ever have been   RASH- not getting any better. Wants to know exactly what it is Duration:  chronic  Location: groin  Itching: yes Burning: yes Redness: yes Oozing: yes Scaling: yes Blisters: yes Painful: yes Fevers: no Change in detergents/soaps/personal care products: no Recent illness: no Recent travel:no History of same: yes Context: worse Alleviating factors: hydrocortisone cream, benadryl and lotion/moisturizer Treatments attempted:hydrocortisone cream, benadryl, OTC anit-fungal and lotion/moisturizer Shortness of breath: no  Throat/tongue swelling: no Myalgias/arthralgias: no  Has been on both prozac and cymbalta (max dose) with her neurologist for her paresthesias. Since then has been having drenching sweats, and nausea and feeling like her blood sugar is going low. Gets dizzy as well. Not feeling like herself. No other concerns or complaints at this time.   Relevant past medical, surgical, family and social history reviewed and updated as indicated. Interim medical history since our last visit reviewed. Allergies and medications reviewed and updated.  Review of Systems  Constitutional: Positive for diaphoresis. Negative for fever, chills, activity  change, appetite change, fatigue and unexpected weight change.  Respiratory: Negative.   Cardiovascular: Negative.   Skin: Positive for rash and wound. Negative for color change and pallor.  Neurological: Positive for dizziness and numbness. Negative for tremors, seizures, syncope, facial asymmetry, speech difficulty, weakness, light-headedness and headaches.  Psychiatric/Behavioral: Negative.     Per HPI unless specifically indicated above     Objective:    BP 130/81 mmHg  Pulse 65  Temp(Src) 99.1 F (37.3 C)  Wt 240 lb (108.863 kg)  SpO2 98%  Wt Readings from Last 3 Encounters:  11/12/15 240 lb (108.863 kg)  10/17/15 249 lb (112.946 kg)  09/18/15 244 lb (110.678 kg)    Physical Exam  Constitutional: She is oriented to person, place, and time. She appears well-developed and well-nourished. No distress.  HENT:  Head: Normocephalic and atraumatic.  Right Ear: Hearing normal.  Left Ear: Hearing normal.  Nose: Nose normal.  Eyes: Conjunctivae and lids are normal. Right eye exhibits no discharge. Left eye exhibits no discharge. No scleral icterus.  Pulmonary/Chest: Effort normal. No respiratory distress.  Musculoskeletal: Normal range of motion.  Neurological: She is alert and oriented to person, place, and time.  Skin: Skin is warm and intact. Rash noted. She is diaphoretic. There is erythema. No pallor.  Pustular excoriated erythematous region in groin L>R, punch biopsy obtained as discussed below.   Psychiatric: She has a normal mood and affect. Her speech is normal and behavior is normal. Judgment and thought content normal. Cognition and memory are normal.  Nursing  note and vitals reviewed.   Results for orders placed or performed in visit on 10/17/15  WET PREP FOR [place], YEAST, CLUE  Result Value Ref Range   Trichomonas Exam Negative Negative   Yeast Exam Negative Negative   Clue Cell Exam Positive (A) Negative  Comprehensive metabolic panel  Result Value Ref Range    Glucose 92 65 - 99 mg/dL   BUN 11 6 - 24 mg/dL   Creatinine, Ser 0.91 0.57 - 1.00 mg/dL   GFR calc non Af Amer 72 >59 mL/min/1.73   GFR calc Af Amer 83 >59 mL/min/1.73   BUN/Creatinine Ratio 12 9 - 23   Sodium 139 134 - 144 mmol/L   Potassium 4.2 3.5 - 5.2 mmol/L   Chloride 101 96 - 106 mmol/L   CO2 22 18 - 29 mmol/L   Calcium 9.3 8.7 - 10.2 mg/dL   Total Protein 6.5 6.0 - 8.5 g/dL   Albumin 4.4 3.5 - 5.5 g/dL   Globulin, Total 2.1 1.5 - 4.5 g/dL   Albumin/Globulin Ratio 2.1 1.2 - 2.2   Bilirubin Total 0.3 0.0 - 1.2 mg/dL   Alkaline Phosphatase 59 39 - 117 IU/L   AST 11 0 - 40 IU/L   ALT 10 0 - 32 IU/L  B Nat Peptide  Result Value Ref Range   BNP 45.3 0.0 - 100.0 pg/mL  UA/M w/rflx Culture, Routine  Result Value Ref Range   Specific Gravity, UA 1.015 1.005 - 1.030   pH, UA 7.0 5.0 - 7.5   Color, UA Yellow Yellow   Appearance Ur Cloudy (A) Clear   Leukocytes, UA Negative Negative   Protein, UA Negative Negative/Trace   Glucose, UA Negative Negative   Ketones, UA Negative Negative   RBC, UA Negative Negative   Bilirubin, UA Negative Negative   Urobilinogen, Ur 0.2 0.2 - 1.0 mg/dL   Nitrite, UA Negative Negative      Assessment & Plan:   Problem List Items Addressed This Visit    None    Visit Diagnoses    Hot flashes    -  Primary    Likely due to too much SSRI. Will bring her off her prozac. Continue cymbalta. Recheck in 3 weeks for mood and issues.     Relevant Orders    Bayer DCA Hb A1c Waived    Comprehensive metabolic panel    Hypoglycemia        A1c 5.9. Continue to monitor.     Relevant Orders    Bayer DCA Hb A1c Waived    Comprehensive metabolic panel    Rash        ?Herpes. Rx given today. Punch biopsy taken to make sure of Dx. Call with concerns.     Relevant Orders    Pathology Report       Skin Procedure  Procedure: Informed consent given.  Sterile prep of the area.  Area infiltrated with lidocaine with epinephrine.  Using a surgical blade,  part of the upper dermis punched out and sent  for pathology.  Area cauterized. Pt ed on scarring     Diagnosis:   ICD-9-CM ICD-10-CM   1. Hot flashes 627.2 N95.1 Bayer DCA Hb A1c Waived     Comprehensive metabolic panel   Likely due to too much SSRI. Will bring her off her prozac. Continue cymbalta. Recheck in 3 weeks for mood and issues.   2. Hypoglycemia 251.2 E16.2 Bayer DCA Hb A1c Waived     Comprehensive metabolic  panel   A1c 5.9. Continue to monitor.   3. Rash 782.1 R21 Pathology Report   ?Herpes. Rx given today. Punch biopsy taken to make sure of Dx. Call with concerns.     Lesion Location/Size: Rash L groin Physician: MJ Consent:  Risks, benefits, and alternative treatments discussed and all questions were answered.  Patient elected to proceed and verbal consent obtained.  Description: Area prepped and draped using semi-sterile technique. Area locally anesthetized using 2 cc's of lidocaine 1% with epi. Lesion biopsied using a punch biopsy size 29mm.  Adequate hemostastis achieved using Drysol Biopsy site closed using steri-strips. Wound dressed after application of bacitracin ointment.  Post Procedure Instructions: Wound care instructions discussed and patient was instructed to keep area clean and dry.  Signs and symptoms of infection discussed, patient agrees to contact the office ASAP should they occur.  Dressing change recommended every other day.   Follow up plan: Return 3-4 weeks.

## 2015-11-12 NOTE — Patient Instructions (Signed)
Take 60mg  of prozac for 4-5 days, then decrease to 40 for 4-5 days, then increase to 20 for 4-5 days, then to 10mg  for 4-5 days then stop

## 2015-11-13 ENCOUNTER — Encounter: Payer: Self-pay | Admitting: Family Medicine

## 2015-11-13 LAB — COMPREHENSIVE METABOLIC PANEL
ALBUMIN: 4.7 g/dL (ref 3.5–5.5)
ALK PHOS: 60 IU/L (ref 39–117)
ALT: 12 IU/L (ref 0–32)
AST: 18 IU/L (ref 0–40)
Albumin/Globulin Ratio: 2.5 — ABNORMAL HIGH (ref 1.2–2.2)
BUN / CREAT RATIO: 18 (ref 9–23)
BUN: 18 mg/dL (ref 6–24)
Bilirubin Total: 0.6 mg/dL (ref 0.0–1.2)
CALCIUM: 10.2 mg/dL (ref 8.7–10.2)
CO2: 25 mmol/L (ref 18–29)
CREATININE: 1.02 mg/dL — AB (ref 0.57–1.00)
Chloride: 101 mmol/L (ref 96–106)
GFR calc Af Amer: 73 mL/min/{1.73_m2} (ref >59)
GFR, EST NON AFRICAN AMERICAN: 63 mL/min/{1.73_m2} (ref >59)
GLOBULIN, TOTAL: 1.9 g/dL (ref 1.5–4.5)
GLUCOSE: 97 mg/dL (ref 65–99)
Potassium: 4 mmol/L (ref 3.5–5.2)
SODIUM: 142 mmol/L (ref 134–144)
Total Protein: 6.6 g/dL (ref 6.0–8.5)

## 2015-11-15 LAB — PATHOLOGY

## 2015-11-25 ENCOUNTER — Telehealth: Payer: Self-pay

## 2015-11-25 MED ORDER — NYSTATIN-TRIAMCINOLONE 100000-0.1 UNIT/GM-% EX OINT: 1.0000 "application " | TOPICAL_OINTMENT | Freq: Two times a day (BID) | CUTANEOUS | Status: DC

## 2015-11-25 NOTE — Telephone Encounter (Signed)
Patient called, she would like a new prescription for the yeast that she has on her stomach and legs, it has flared up again. [place]

## 2015-11-25 NOTE — Telephone Encounter (Signed)
Rx sent to her pharmacy 

## 2015-12-02 ENCOUNTER — Other Ambulatory Visit: Payer: Self-pay

## 2015-12-02 MED ORDER — VALACYCLOVIR HCL 1 G PO TABS: 1000.0000 mg | ORAL_TABLET | Freq: Two times a day (BID) | ORAL | Status: DC

## 2015-12-02 NOTE — Telephone Encounter (Signed)
Patient called and left me a voicemail requesting a refill for valtrex for her lips.

## 2015-12-03 ENCOUNTER — Encounter: Payer: Self-pay | Admitting: Family Medicine

## 2015-12-03 ENCOUNTER — Ambulatory Visit (INDEPENDENT_AMBULATORY_CARE_PROVIDER_SITE_OTHER): Payer: BLUE CROSS/BLUE SHIELD | Admitting: Family Medicine

## 2015-12-03 VITALS — BP 133/77 | HR 71 | Temp 98.6°F | Wt 235.5 lb

## 2015-12-03 DIAGNOSIS — B009 Herpesviral infection, unspecified: Secondary | ICD-10-CM

## 2015-12-03 DIAGNOSIS — F329 Major depressive disorder, single episode, unspecified: Secondary | ICD-10-CM | POA: Diagnosis not present

## 2015-12-03 DIAGNOSIS — F32A Depression, unspecified: Secondary | ICD-10-CM

## 2015-12-03 DIAGNOSIS — Z79899 Other long term (current) drug therapy: Secondary | ICD-10-CM | POA: Diagnosis not present

## 2015-12-03 MED ORDER — CLONAZEPAM 0.5 MG PO TABS: 0.5000 mg | ORAL_TABLET | Freq: Every day | ORAL | Status: DC | PRN

## 2015-12-03 MED ORDER — ARIPIPRAZOLE 5 MG PO TABS: 5.0000 mg | ORAL_TABLET | Freq: Every day | ORAL | Status: DC

## 2015-12-03 MED ORDER — VALACYCLOVIR HCL 1 G PO TABS: 1000.0000 mg | ORAL_TABLET | Freq: Every day | ORAL | Status: DC

## 2015-12-03 NOTE — Assessment & Plan Note (Signed)
Not under good control due to social situation and change in meds. Will start her on abilify and recheck in 1 month. Klonopin PRN for the next month. Controlled substance agreement signed.

## 2015-12-03 NOTE — Assessment & Plan Note (Signed)
Will get her on preventative medication and recheck in 1 month. Call with concerns.

## 2015-12-03 NOTE — Progress Notes (Signed)
BP 133/77 mmHg  Pulse 71  Temp(Src) 98.6 F (37 C)  Wt 235 lb 8 oz (106.822 kg)  SpO2 98%   Subjective:    Patient ID: Hannah Kaiser, female    DOB: 1960/12/30, 55 y.o.   MRN: [id number]  HPI: Hannah Kaiser is a 55 y.o. female  Chief Complaint  Patient presents with  . Depression   DEPRESSION- now off the prozac and just on the cymbalta. Hot flashes resolved. Just broke up with her boyfriend. 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- wants preventative medicine as outbreaks happening a bit more often Mood status: exacerbated Satisfied with current treatment?: no Symptom severity: severe  Duration of current treatment : Just changed Side effects: no Medication compliance: excellent compliance Psychotherapy/counseling: no  Previous psychiatric medications: buspar, cymbalta, prozac and wellbutrin Depressed mood: yes Anxious mood: yes Anhedonia: no Significant weight loss or gain: no Insomnia: yes hard to fall asleep Fatigue: yes Feelings of worthlessness or guilt: yes Impaired concentration/indecisiveness: yes Suicidal ideations: no Hopelessness: yes Crying spells: yes Depression screen PHQ 2/9 12/03/2015  Decreased Interest 3  Down, Depressed, Hopeless 3  PHQ - 2 Score 6  Altered sleeping 3  Tired, decreased energy 3  Change in appetite 3  Feeling bad or failure about yourself  3  Trouble concentrating 3  Moving slowly or fidgety/restless 3  Suicidal thoughts 0  PHQ-9 Score 24  Difficult doing work/chores Very difficult    Relevant past medical, surgical, family and social history reviewed and updated as indicated. Interim medical history since our last visit reviewed. Allergies and medications reviewed and updated.  Review of Systems  Constitutional: Negative.   Respiratory: Negative.   Cardiovascular: Negative.   Psychiatric/Behavioral: Positive for dysphoric mood and decreased concentration. Negative for suicidal ideas, hallucinations, behavioral problems,  confusion, sleep disturbance, self-injury and agitation. The patient is nervous/anxious. The patient is not hyperactive.     Per HPI unless specifically indicated above     Objective:    BP 133/77 mmHg  Pulse 71  Temp(Src) 98.6 F (37 C)  Wt 235 lb 8 oz (106.822 kg)  SpO2 98%  Wt Readings from Last 3 Encounters:  12/03/15 235 lb 8 oz (106.822 kg)  11/12/15 240 lb (108.863 kg)  10/17/15 249 lb (112.946 kg)    Physical Exam  Constitutional: She is oriented to person, place, and time. She appears well-developed and well-nourished. No distress.  HENT:  Head: Normocephalic and atraumatic.  Right Ear: Hearing and external ear normal.  Left Ear: Hearing and external ear normal.  Nose: Nose normal.  Mouth/Throat: Oropharynx is clear and moist.  Eyes: Conjunctivae, EOM and lids are normal. Pupils are equal, round, and reactive to light. Right eye exhibits no discharge. Left eye exhibits no discharge. No scleral icterus.  Pulmonary/Chest: Effort normal. No respiratory distress.  Musculoskeletal: Normal range of motion.  Neurological: She is alert and oriented to person, place, and time.  Skin: Skin is warm, dry and intact. No rash noted. She is not diaphoretic. No erythema. No pallor.  Psychiatric: Her speech is normal and behavior is normal. Judgment and thought content normal. Cognition and memory are normal. She exhibits a depressed mood.  Tearful     Results for orders placed or performed in visit on 11/12/15  Bayer DCA Hb A1c Waived  Result Value Ref Range   Bayer DCA Hb A1c Waived 5.9 <7.0 %  Comprehensive metabolic panel  Result Value Ref Range   Glucose 97 65 -  99 mg/dL   BUN 18 6 - 24 mg/dL   Creatinine, Ser 1.02 (H) 0.57 - 1.00 mg/dL   GFR calc non Af Amer 63 >59 mL/min/1.73   GFR calc Af Amer 73 >59 mL/min/1.73   BUN/Creatinine Ratio 18 9 - 23   Sodium 142 134 - 144 mmol/L   Potassium 4.0 3.5 - 5.2 mmol/L   Chloride 101 96 - 106 mmol/L   CO2 25 18 - 29 mmol/L    Calcium 10.2 8.7 - 10.2 mg/dL   Total Protein 6.6 6.0 - 8.5 g/dL   Albumin 4.7 3.5 - 5.5 g/dL   Globulin, Total 1.9 1.5 - 4.5 g/dL   Albumin/Globulin Ratio 2.5 (H) 1.2 - 2.2   Bilirubin Total 0.6 0.0 - 1.2 mg/dL   Alkaline Phosphatase 60 39 - 117 IU/L   AST 18 0 - 40 IU/L   ALT 12 0 - 32 IU/L  Pathology Report  Result Value Ref Range   PATH REPORT.SITE OF ORIGIN SPEC Comment    . Comment    PATH REPORT.RELEVANT HX SPEC Comment    PATH REPORT.FINAL DX SPEC Comment    SIGNED OUT BY: Comment    GROSS DESCRIPTION: Comment    . Comment    PAYMENT PROCEDURE Comment       Assessment & Plan:   Problem List Items Addressed This Visit      Other   Herpes simplex infection - Primary    Will get her on preventative medication and recheck in 1 month. Call with concerns.       Relevant Medications   valACYclovir (VALTREX) 1000 MG tablet   Depression    Not under good control due to social situation and change in meds. Will start her on abilify and recheck in 1 month. Klonopin PRN for the next month. Controlled substance agreement signed.       Controlled substance agreement signed       Follow up plan: Return in about 4 weeks (around 12/31/2015) for Follow up mood.

## 2015-12-07 ENCOUNTER — Other Ambulatory Visit: Payer: Self-pay | Admitting: Family Medicine

## 2015-12-13 ENCOUNTER — Telehealth: Payer: Self-pay | Admitting: Family Medicine

## 2015-12-13 NOTE — Telephone Encounter (Signed)
Called regarding non-urgent medical question.

## 2015-12-13 NOTE — Telephone Encounter (Signed)
About her brother- see that note

## 2016-01-14 ENCOUNTER — Ambulatory Visit (INDEPENDENT_AMBULATORY_CARE_PROVIDER_SITE_OTHER): Payer: BLUE CROSS/BLUE SHIELD | Admitting: Family Medicine

## 2016-01-14 ENCOUNTER — Encounter: Payer: Self-pay | Admitting: Family Medicine

## 2016-01-14 DIAGNOSIS — M170 Bilateral primary osteoarthritis of knee: Secondary | ICD-10-CM

## 2016-01-14 DIAGNOSIS — M129 Arthropathy, unspecified: Secondary | ICD-10-CM | POA: Diagnosis not present

## 2016-01-14 NOTE — Assessment & Plan Note (Signed)
Steroid injection of R knee today, will do L knee on Thursday.

## 2016-01-14 NOTE — Progress Notes (Signed)
BP 124/85 (BP Location: Left Arm, Patient Position: Sitting, Cuff Size: Large)   Pulse 80   Temp 98.5 F (36.9 C)   Wt 224 lb (101.6 kg)   SpO2 98%   BMI 39.06 kg/m    Subjective:    Patient ID: Hannah Kaiser, female    DOB: [birth year], 55 y.o.   MRN: [id number]  HPI: Hannah Kaiser is a 55 y.o. female  Chief Complaint  Patient presents with  . Knee Pain   KNEE PAIN Duration: chronic Involved knee: bilateral Mechanism of injury: unknown Location:diffuse Onset: gradual Severity: severe  Quality:  Sharp and severe Frequency: constant Radiation: no Aggravating factors: weight bearing, walking and stairs  Alleviating factors: nothing  Status: worse Treatments attempted: rest, ice, heat, APAP, ibuprofen, aleve and HEP  Relief with NSAIDs?:  mild Weakness with weight bearing or walking: no Sensation of giving way: no Locking: yes Popping: yes Bruising: no Swelling: yes Redness: no Paresthesias/decreased sensation: no Fevers: no  Relevant past medical, surgical, family and social history reviewed and updated as indicated. Interim medical history since our last visit reviewed. Allergies and medications reviewed and updated.  Review of Systems  Constitutional: Negative.   Respiratory: Negative.   Cardiovascular: Negative.   Musculoskeletal: Positive for arthralgias, gait problem and joint swelling. Negative for back pain, myalgias, neck pain and neck stiffness.  Psychiatric/Behavioral: Negative.     Per HPI unless specifically indicated above     Objective:    BP 124/85 (BP Location: Left Arm, Patient Position: Sitting, Cuff Size: Large)   Pulse 80   Temp 98.5 F (36.9 C)   Wt 224 lb (101.6 kg)   SpO2 98%   BMI 39.06 kg/m   Wt Readings from Last 3 Encounters:  01/14/16 224 lb (101.6 kg)  12/03/15 235 lb 8 oz (106.8 kg)  11/12/15 240 lb (108.9 kg)    Physical Exam  Constitutional: She is oriented to person, place, and time. She appears well-developed and  well-nourished. No distress.  HENT:  Head: Normocephalic and atraumatic.  Right Ear: Hearing normal.  Left Ear: Hearing normal.  Nose: Nose normal.  Eyes: Conjunctivae and lids are normal. Right eye exhibits no discharge. Left eye exhibits no discharge. No scleral icterus.  Pulmonary/Chest: Effort normal. No respiratory distress.  Musculoskeletal: She exhibits edema and tenderness.  Tenderness along the joint line bilaterally, mild swelling, decreased ROM, + crepitus  Neurological: She is alert and oriented to person, place, and time.  Skin: Skin is warm and intact. No rash noted. No erythema. No pallor.  Psychiatric: She has a normal mood and affect. Her speech is normal and behavior is normal. Judgment and thought content normal. Cognition and memory are normal.  Nursing note and vitals reviewed.   Results for orders placed or performed in visit on 11/12/15  Bayer DCA Hb A1c Waived  Result Value Ref Range   Bayer DCA Hb A1c Waived 5.9 <7.0 %  Comprehensive metabolic panel  Result Value Ref Range   Glucose 97 65 - 99 mg/dL   BUN 18 6 - 24 mg/dL   Creatinine, Ser 1.02 (H) 0.57 - 1.00 mg/dL   GFR calc non Af Amer 63 >59 mL/min/1.73   GFR calc Af Amer 73 >59 mL/min/1.73   BUN/Creatinine Ratio 18 9 - 23   Sodium 142 134 - 144 mmol/L   Potassium 4.0 3.5 - 5.2 mmol/L   Chloride 101 96 - 106 mmol/L   CO2 25 18 - 29 mmol/L  Calcium 10.2 8.7 - 10.2 mg/dL   Total Protein 6.6 6.0 - 8.5 g/dL   Albumin 4.7 3.5 - 5.5 g/dL   Globulin, Total 1.9 1.5 - 4.5 g/dL   Albumin/Globulin Ratio 2.5 (H) 1.2 - 2.2   Bilirubin Total 0.6 0.0 - 1.2 mg/dL   Alkaline Phosphatase 60 39 - 117 IU/L   AST 18 0 - 40 IU/L   ALT 12 0 - 32 IU/L  Pathology Report  Result Value Ref Range   PATH REPORT.SITE OF ORIGIN SPEC Comment    . Comment    PATH REPORT.RELEVANT HX SPEC Comment    PATH REPORT.FINAL DX SPEC Comment    SIGNED OUT BY: Comment    GROSS DESCRIPTION: Comment    . Comment    PAYMENT PROCEDURE  Comment       Assessment & Plan:   Problem List Items Addressed This Visit      Musculoskeletal and Integument   Arthritis of both knees    Steroid injection of R knee today, will do L knee on Thursday.       Other Visit Diagnoses   None.     Procedure: Right  Knee Intraarticular Steroid Injection        Diagnosis:   ICD-9-CM ICD-10-CM   1. Arthritis of both knees 716.96 M12.9     Physician: MJ Consent:  Risks, benefits, and alternative treatments discussed and all questions were answered.  Patient elected to proceed and verbal consent obtained.  Description: Area prepped and draped using  semi-sterile technique.  Using a anterior/lateral approach, a mixture of 4 cc of  0.5% lidocaine & 1 cc of Kenalog 40 was injected into knee joint.  A bandage was then placed over the injection site. Complications: none Post Procedure Instructions: Wound care instructions discussed and patient was instructed to keep area clean and dry.  Signs and symptoms of infection discussed, patient agrees to contact the office ASAP should they occur.  Follow Up: Return Thursday .  Follow up plan: Return Thursday .

## 2016-01-16 ENCOUNTER — Encounter: Payer: Self-pay | Admitting: Family Medicine

## 2016-01-16 ENCOUNTER — Ambulatory Visit (INDEPENDENT_AMBULATORY_CARE_PROVIDER_SITE_OTHER): Payer: BLUE CROSS/BLUE SHIELD | Admitting: Family Medicine

## 2016-01-16 VITALS — BP 132/79 | HR 77 | Temp 98.7°F | Wt 221.0 lb

## 2016-01-16 DIAGNOSIS — M129 Arthropathy, unspecified: Secondary | ICD-10-CM

## 2016-01-16 DIAGNOSIS — M170 Bilateral primary osteoarthritis of knee: Secondary | ICD-10-CM

## 2016-01-16 NOTE — Progress Notes (Signed)
Here today for 2nd knee injection of L knee.   Procedure: Left  Knee Intraarticular Steroid Injection        Diagnosis:   ICD-9-CM ICD-10-CM   1. Arthritis of both knees 716.96 M12.9     Physician: MJ Consent:  Risks, benefits, and alternative treatments discussed and all questions were answered.  Patient elected to proceed and verbal consent obtained.  Description: Area prepped and draped using  semi-sterile technique.  Using a anterior/lateral approach, a mixture of 4 cc of  0.5% marcaine & 1 cc of Kenalog 40 was injected into knee joint.  A bandage was then placed over the injection site Complications: none Post Procedure Instructions: Wound care instructions discussed and patient was instructed to keep area clean and dry.  Signs and symptoms of infection discussed, patient agrees to contact the office ASAP should they occur.  Follow Up: Return As scheduled.

## 2016-01-19 ENCOUNTER — Other Ambulatory Visit: Payer: Self-pay | Admitting: Family Medicine

## 2016-01-20 ENCOUNTER — Ambulatory Visit: Payer: BLUE CROSS/BLUE SHIELD | Admitting: Family Medicine

## 2016-01-25 ENCOUNTER — Other Ambulatory Visit: Payer: Self-pay | Admitting: Family Medicine

## 2016-01-31 ENCOUNTER — Other Ambulatory Visit: Payer: Self-pay | Admitting: Unknown Physician Specialty

## 2016-02-03 ENCOUNTER — Other Ambulatory Visit: Payer: Self-pay | Admitting: Family Medicine

## 2016-02-04 ENCOUNTER — Other Ambulatory Visit: Payer: Self-pay | Admitting: Neurosurgery

## 2016-02-04 DIAGNOSIS — M4310 Spondylolisthesis, site unspecified: Secondary | ICD-10-CM

## 2016-02-20 ENCOUNTER — Ambulatory Visit
Admission: RE | Admit: 2016-02-20 | Discharge: 2016-02-20 | Disposition: A | Discharge status: Home / Self Care | Payer: BLUE CROSS/BLUE SHIELD | Source: Ambulatory Visit | Attending: Neurosurgery | Admitting: Neurosurgery

## 2016-02-20 DIAGNOSIS — M4310 Spondylolisthesis, site unspecified: Secondary | ICD-10-CM

## 2016-02-20 MED ORDER — DIAZEPAM 5 MG PO TABS
10.0000 mg | ORAL_TABLET | Freq: Once | ORAL | Status: AC
  Administered 2016-02-20: 10 mg via ORAL

## 2016-02-20 MED ORDER — IOPAMIDOL (ISOVUE-M 200) INJECTION 41%
20.0000 mL | Freq: Once | INTRAMUSCULAR | Status: AC
  Administered 2016-02-20: 20 mL via INTRATHECAL

## 2016-02-20 MED ORDER — MEPERIDINE HCL 100 MG/ML IJ SOLN
75.0000 mg | Freq: Once | INTRAMUSCULAR | Status: AC
  Administered 2016-02-20: 75 mg via INTRAMUSCULAR

## 2016-02-20 MED ORDER — ONDANSETRON HCL 4 MG/2ML IJ SOLN
4.0000 mg | Freq: Once | INTRAMUSCULAR | Status: AC
  Administered 2016-02-20: 4 mg via INTRAMUSCULAR

## 2016-02-20 NOTE — Discharge Instructions (Signed)
Myelogram Discharge Instructions  1. Go home and rest quietly for the next 24 hours.  It is important to lie flat for the next 24 hours.  Get up only to go to the restroom.  You may lie in the bed or on a couch on your back, your stomach, your left side or your right side.  You may have one pillow under your head.  You may have pillows between your knees while you are on your side or under your knees while you are on your back.  2. DO NOT drive today.  Recline the seat as far back as it will go, while still wearing your seat belt, on the way home.  3. You may get up to go to the bathroom as needed.  You may sit up for 10 minutes to eat.  You may resume your normal diet and medications unless otherwise indicated.  Drink lots of extra fluids today and tomorrow.  4. The incidence of headache, nausea, or vomiting is about 5% (one in 20 patients).  If you develop a headache, lie flat and drink plenty of fluids until the headache goes away.  Caffeinated beverages may be helpful.  If you develop severe nausea and vomiting or a headache that does not go away with flat bed rest, call [phone].  5. You may resume normal activities after your 24 hours of bed rest is over; however, do not exert yourself strongly or do any heavy lifting tomorrow. If when you get up you have a headache when standing, go back to bed and force fluids for another 24 hours.  6. Call your physician for a follow-up appointment.  The results of your myelogram will be sent directly to your physician by the following day.  7. If you have any questions or if complications develop after you arrive home, please call [phone].  Discharge instructions have been explained to the patient.  The patient, or the person responsible for the patient, fully understands these instructions.        MAY RESUME ABILIFY, CYMBALTA AND TRAMADOL ON SEPT. 15, 2017, AFTER 9:30 AM.

## 2016-02-20 NOTE — Progress Notes (Signed)
Pt states she has been off Cymbalta, Abilify and Tramadol for the past 2 days.

## 2016-02-24 ENCOUNTER — Ambulatory Visit (INDEPENDENT_AMBULATORY_CARE_PROVIDER_SITE_OTHER): Payer: BLUE CROSS/BLUE SHIELD | Admitting: Family Medicine

## 2016-02-24 ENCOUNTER — Encounter: Payer: Self-pay | Admitting: Family Medicine

## 2016-02-24 VITALS — BP 138/87 | HR 66 | Temp 98.8°F | Wt 227.0 lb

## 2016-02-24 DIAGNOSIS — Z1239 Encounter for other screening for malignant neoplasm of breast: Secondary | ICD-10-CM

## 2016-02-24 DIAGNOSIS — F329 Major depressive disorder, single episode, unspecified: Secondary | ICD-10-CM

## 2016-02-24 DIAGNOSIS — D485 Neoplasm of uncertain behavior of skin: Secondary | ICD-10-CM

## 2016-02-24 DIAGNOSIS — N8110 Cystocele, unspecified: Secondary | ICD-10-CM | POA: Diagnosis not present

## 2016-02-24 DIAGNOSIS — Z23 Encounter for immunization: Secondary | ICD-10-CM | POA: Diagnosis not present

## 2016-02-24 DIAGNOSIS — N644 Mastodynia: Secondary | ICD-10-CM | POA: Diagnosis not present

## 2016-02-24 DIAGNOSIS — Z1211 Encounter for screening for malignant neoplasm of colon: Secondary | ICD-10-CM | POA: Diagnosis not present

## 2016-02-24 DIAGNOSIS — F32A Depression, unspecified: Secondary | ICD-10-CM

## 2016-02-24 MED ORDER — CLONAZEPAM 0.5 MG PO TABS: 0.5000 mg | ORAL_TABLET | Freq: Every day | ORAL | 0 refills | Status: DC | PRN

## 2016-02-24 MED ORDER — ARIPIPRAZOLE 10 MG PO TABS: 10.0000 mg | ORAL_TABLET | Freq: Every day | ORAL | 3 refills | Status: DC

## 2016-02-24 MED ORDER — TIZANIDINE HCL 4 MG PO TABS: 4.0000 mg | ORAL_TABLET | Freq: Every day | ORAL | 2 refills | Status: DC

## 2016-02-24 NOTE — Assessment & Plan Note (Signed)
Kegals given. If not better in about a month, will refer to uro/gyn

## 2016-02-24 NOTE — Progress Notes (Signed)
BP 138/87 (BP Location: Left Arm, Patient Position: Sitting, Cuff Size: Large)   Pulse 66   Temp 98.8 F (37.1 C)   Wt 227 lb (103 kg)   SpO2 99%   BMI 39.58 kg/m    Subjective:    Patient ID: Hannah Kaiser, female    DOB: 05/13/1961, 55 y.o.   MRN: [id number]  HPI: Hannah Kaiser is a 55 y.o. female  Chief Complaint  Patient presents with  . Depression    Klonopin Refill  . vaginal  pressure  . Spasms    refill on tizanidine  . skin lesion    on breasts, abdomen and pelvic area, patient would like a referral to dermatology  . mammogram    patients needs an order, but unsure if the lesions and pain needs to be documented   DEPRESSION- just found out her daughter in law has invasive cervical cancer. Not doing well with that Mood status: exacerbated Satisfied with current treatment?: no Symptom severity: moderate  Duration of current treatment : chronic Side effects: no Medication compliance: excellent compliance Psychotherapy/counseling: no  Depressed mood: yes Anxious mood: yes Anhedonia: no Significant weight loss or gain: yes Insomnia: yes hard to fall asleep Fatigue: yes Feelings of worthlessness or guilt: yes Impaired concentration/indecisiveness: yes Suicidal ideations: no Hopelessness: no Crying spells: yes Depression screen [facility] 2/9 02/24/2016 12/03/2015  Decreased Interest 1 3  Down, Depressed, Hopeless 2 3  PHQ - 2 Score 3 6  Altered sleeping 3 3  Tired, decreased energy 3 3  Change in appetite 1 3  Feeling bad or failure about yourself  2 3  Trouble concentrating 3 3  Moving slowly or fidgety/restless 1 3  Suicidal thoughts 0 0  PHQ-9 Score 16 24  Difficult doing work/chores - Very difficult   GAD 7 : Generalized Anxiety Score 02/24/2016  Nervous, Anxious, on Edge 3  Control/stop worrying 2  Worry too much - different things 2  Trouble relaxing 3  Restless 3  Easily annoyed or irritable 3  Afraid - awful might happen 3  Total GAD 7 Score 19    Anxiety Difficulty Very difficult   Having intense pelvic pressure between her anus and her vagina in the past month- no odor, still having itching, Usually happens all the time, nothing brings it on, sitting makes it better, Has been a little more constipated. No vaginal discharge. Had her hysterectomy about 2 years, hasn't noticed any prolapse  BREAST PAIN Duration : 2 months Location: right Onset: sudden Severity: moderate Quality: sharp Frequency: intermittent Redness: no Swelling: yes Trauma: no trauma Breastfeeding: no Associated with menstral cycle: no Nipple discharge: yes- has had it for years Breast lump: no Status: worse Treatments attempted: none Previous mammogram: yes   Relevant past medical, surgical, family and social history reviewed and updated as indicated. Interim medical history since our last visit reviewed. Allergies and medications reviewed and updated.  Review of Systems  Constitutional: Negative.   Respiratory: Negative.   Cardiovascular: Negative.   Genitourinary: Positive for pelvic pain, vaginal discharge and vaginal pain. Negative for decreased urine volume, difficulty urinating, dyspareunia, dysuria, enuresis, flank pain, frequency, genital sores, hematuria, menstrual problem, urgency and vaginal bleeding.  Psychiatric/Behavioral: Positive for dysphoric mood and sleep disturbance. Negative for agitation, behavioral problems, confusion, decreased concentration, hallucinations, self-injury and suicidal ideas. The patient is nervous/anxious. The patient is not hyperactive.     Per HPI unless specifically indicated above     Objective:  BP 138/87 (BP Location: Left Arm, Patient Position: Sitting, Cuff Size: Large)   Pulse 66   Temp 98.8 F (37.1 C)   Wt 227 lb (103 kg)   SpO2 99%   BMI 39.58 kg/m   Wt Readings from Last 3 Encounters:  02/24/16 227 lb (103 kg)  01/16/16 221 lb (100.2 kg)  01/14/16 224 lb (101.6 kg)    Physical Exam   Constitutional: She is oriented to person, place, and time. She appears well-developed and well-nourished. No distress.  HENT:  Head: Normocephalic and atraumatic.  Right Ear: Hearing normal.  Left Ear: Hearing normal.  Nose: Nose normal.  Eyes: Conjunctivae and lids are normal. Right eye exhibits no discharge. Left eye exhibits no discharge. No scleral icterus.  Cardiovascular: Normal rate, regular rhythm, normal heart sounds and intact distal pulses.  Exam reveals no gallop and no friction rub.   No murmur heard. Pulmonary/Chest: Effort normal and breath sounds normal. No respiratory distress. She has no wheezes. She has no rales. She exhibits no tenderness.  Genitourinary:  Genitourinary Comments: Perineum irritated with slight tear Loose tissue with bladder descending  Musculoskeletal: Normal range of motion.  Neurological: She is alert and oriented to person, place, and time.  Skin: Skin is warm, dry and intact. No rash noted. She is not diaphoretic. No erythema. No pallor.  Psychiatric: She has a normal mood and affect. Her speech is normal and behavior is normal. Judgment and thought content normal. Cognition and memory are normal.  Nursing note and vitals reviewed.   Results for orders placed or performed in visit on 11/12/15  Bayer DCA Hb A1c Waived  Result Value Ref Range   Bayer DCA Hb A1c Waived 5.9 <7.0 %  Comprehensive metabolic panel  Result Value Ref Range   Glucose 97 65 - 99 mg/dL   BUN 18 6 - 24 mg/dL   Creatinine, Ser 1.02 (H) 0.57 - 1.00 mg/dL   GFR calc non Af Amer 63 >59 mL/min/1.73   GFR calc Af Amer 73 >59 mL/min/1.73   BUN/Creatinine Ratio 18 9 - 23   Sodium 142 134 - 144 mmol/L   Potassium 4.0 3.5 - 5.2 mmol/L   Chloride 101 96 - 106 mmol/L   CO2 25 18 - 29 mmol/L   Calcium 10.2 8.7 - 10.2 mg/dL   Total Protein 6.6 6.0 - 8.5 g/dL   Albumin 4.7 3.5 - 5.5 g/dL   Globulin, Total 1.9 1.5 - 4.5 g/dL   Albumin/Globulin Ratio 2.5 (H) 1.2 - 2.2    Bilirubin Total 0.6 0.0 - 1.2 mg/dL   Alkaline Phosphatase 60 39 - 117 IU/L   AST 18 0 - 40 IU/L   ALT 12 0 - 32 IU/L  Pathology Report  Result Value Ref Range   PATH REPORT.SITE OF ORIGIN SPEC Comment    . Comment    PATH REPORT.RELEVANT HX SPEC Comment    PATH REPORT.FINAL DX SPEC Comment    SIGNED OUT BY: Comment    GROSS DESCRIPTION: Comment    . Comment    PAYMENT PROCEDURE Comment       Assessment & Plan:   Problem List Items Addressed This Visit      Genitourinary   Bladder prolapse, female, acquired    Kegals given. If not better in about a month, will refer to uro/gyn        Other   Depression - Primary    Not doing great. Will increase abilify to 10mg  and recheck  in 1 month. Refill of klonopin given today. Continue to monitor.        Other Visit Diagnoses    Immunization due       Flu and pneumovax given today.   Relevant Orders   Pneumococcal polysaccharide vaccine 23-valent greater than or equal to 2yo subcutaneous/IM (Completed)   Flu vaccine greater than or equal to 3yo preservative free IM (Completed)   Screening for breast cancer       Order for mammogram put in today.   Relevant Orders   MM Digital Diagnostic Bilat   US BREAST COMPLETE UNI RIGHT INC AXILLA   US BREAST COMPLETE UNI LEFT INC AXILLA   Neoplasm of uncertain behavior of skin       Patient would like to see dermatology for a variety of reasons. Referral put in today.   Relevant Orders   Ambulatory referral to Dermatology   MM Digital Diagnostic Bilat   US BREAST COMPLETE UNI RIGHT INC AXILLA   US BREAST COMPLETE UNI LEFT INC AXILLA   Breast pain       Mammogram and ultrasound ordered. Await results.    Relevant Orders   MM Digital Diagnostic Bilat   US BREAST COMPLETE UNI RIGHT INC AXILLA   US BREAST COMPLETE UNI LEFT INC AXILLA   Screening for colon cancer       Referral generated.        Follow up plan: No Follow-up on file.

## 2016-02-24 NOTE — Patient Instructions (Addendum)
Influenza (Flu) Vaccine (Inactivated or Recombinant):  1. Why get vaccinated? Influenza ("flu") is a contagious disease that spreads around the [place] every year, usually between October and May. Flu is caused by influenza viruses, and is spread mainly by coughing, sneezing, and close contact. Anyone can get flu. Flu strikes suddenly and can last several days. Symptoms vary by age, but can include:  fever/chills  sore throat  muscle aches  fatigue  cough  headache  runny or stuffy nose Flu can also lead to pneumonia and blood infections, and cause diarrhea and seizures in children. If you have a medical condition, such as heart or lung disease, flu can make it worse. Flu is more dangerous for some people. Infants and young children, people 65 years of age and older, pregnant women, and people with certain health conditions or a weakened immune system are at greatest risk. Each year thousands of people in the [place] die from flu, and many more are hospitalized. Flu vaccine can:  keep you from getting flu,  make flu less severe if you do get it, and  keep you from spreading flu to your family and other people. 2. Inactivated and recombinant flu vaccines A dose of flu vaccine is recommended every flu season. Children 6 months through 8 years of age may need two doses during the same flu season. Everyone else needs only one dose each flu season. Some inactivated flu vaccines contain a very small amount of a mercury-based preservative called thimerosal. Studies have not shown thimerosal in vaccines to be harmful, but flu vaccines that do not contain thimerosal are available. There is no live flu virus in flu shots. They cannot cause the flu. There are many flu viruses, and they are always changing. Each year a new flu vaccine is made to protect against three or four viruses that are likely to cause disease in the upcoming flu season. But even when the vaccine doesn't exactly  match these viruses, it may still provide some protection. Flu vaccine cannot prevent:  flu that is caused by a virus not covered by the vaccine, or  illnesses that look like flu but are not. It takes about 2 weeks for protection to develop after vaccination, and protection lasts through the flu season. 3. Some people should not get this vaccine Tell the person who is giving you the vaccine:  If you have any severe, life-threatening allergies. If you ever had a life-threatening allergic reaction after a dose of flu vaccine, or have a severe allergy to any part of this vaccine, you may be advised not to get vaccinated. Most, but not all, types of flu vaccine contain a small amount of egg protein.  If you ever had Guillain-Barre Syndrome (also called GBS). Some people with a history of GBS should not get this vaccine. This should be discussed with your doctor.  If you are not feeling well. It is usually okay to get flu vaccine when you have a mild illness, but you might be asked to come back when you feel better. 4. Risks of a vaccine reaction With any medicine, including vaccines, there is a chance of reactions. These are usually mild and go away on their own, but serious reactions are also possible. Most people who get a flu shot do not have any problems with it. Minor problems following a flu shot include:  soreness, redness, or swelling where the shot was given  hoarseness  sore, red or itchy eyes  cough    fever  aches  headache  itching  fatigue If these problems occur, they usually begin soon after the shot and last 1 or 2 days. More serious problems following a flu shot can include the following:  There may be a small increased risk of Guillain-Barre Syndrome (GBS) after inactivated flu vaccine. This risk has been estimated at 1 or 2 additional cases per million people vaccinated. This is much lower than the risk of severe complications from flu, which can be prevented by  flu vaccine.  Young children who get the flu shot along with pneumococcal vaccine (PCV13) and/or DTaP vaccine at the same time might be slightly more likely to have a seizure caused by fever. Ask your doctor for more information. Tell your doctor if a child who is getting flu vaccine has ever had a seizure. Problems that could happen after any injected vaccine:  People sometimes faint after a medical procedure, including vaccination. Sitting or lying down for about 15 minutes can help prevent fainting, and injuries caused by a fall. Tell your doctor if you feel dizzy, or have vision changes or ringing in the ears.  Some people get severe pain in the shoulder and have difficulty moving the arm where a shot was given. This happens very rarely.  Any medication can cause a severe allergic reaction. Such reactions from a vaccine are very rare, estimated at about 1 in a million doses, and would happen within a few minutes to a few hours after the vaccination. As with any medicine, there is a very remote chance of a vaccine causing a serious injury or death. The safety of vaccines is always being monitored. For more information, visit: www.cdc.gov/vaccinesafety/ 5. What if there is a serious reaction? What should I look for?  Look for anything that concerns you, such as signs of a severe allergic reaction, very high fever, or unusual behavior. Signs of a severe allergic reaction can include hives, swelling of the face and throat, difficulty breathing, a fast heartbeat, dizziness, and weakness. These would start a few minutes to a few hours after the vaccination. What should I do?  If you think it is a severe allergic reaction or other emergency that can't wait, call 9-1-1 and get the person to the nearest hospital. Otherwise, call your doctor.  Reactions should be reported to the Vaccine Adverse Event Reporting System (VAERS). Your doctor should file this report, or you can do it yourself through the  VAERS web site at www.vaers.hhs.gov, or by calling 1-800-822-7967. VAERS does not give medical advice. 6. The National Vaccine Injury Compensation Program The National Vaccine Injury Compensation Program (VICP) is a federal program that was created to compensate people who may have been injured by certain vaccines. Persons who believe they may have been injured by a vaccine can learn about the program and about filing a claim by calling 1-800-338-2382 or visiting the VICP website at www.hrsa.gov/vaccinecompensation. There is a time limit to file a claim for compensation. 7. How can I learn more?  Ask your healthcare provider. He or she can give you the vaccine package insert or suggest other sources of information.  Call your local or state health department.  Contact the Centers for Disease Control and Prevention (CDC):  Call 1-800-232-4636 (1-800-CDC-INFO) or  Visit CDC's website at www.cdc.gov/flu Vaccine Information Statement Inactivated Influenza Vaccine (01/12/2014)   This information is not intended to replace advice given to you by your health care provider. Make sure you discuss any questions you have with   your health care provider.   Document Released: 03/19/2006 Document Revised: 06/15/2014 Document Reviewed: 01/15/2014 Elsevier Interactive Patient Education 2016 [place]. Pneumococcal Vaccine, Polyvalent solution for injection What is this medicine? PNEUMOCOCCAL VACCINE, POLYVALENT (NEU mo KOK al vak SEEN, pol ee VEY luhnt) is a vaccine to prevent pneumococcus bacteria infection. These bacteria are a major cause of ear infections, Strep throat infections, and serious pneumonia, meningitis, or blood infections worldwide. These vaccines help the body to produce antibodies (protective substances) that help your body defend against these bacteria. This vaccine is recommended for people 32 years of age and older with health problems. It is also recommended for all adults over 21  years old. This vaccine will not treat an infection. This medicine may be used for other purposes; ask your health care provider or pharmacist if you have questions. What should I tell my health care provider before I take this medicine? They need to know if you have any of these conditions: -bleeding problems -bone marrow or organ transplant -cancer, Hodgkin's disease -fever -infection -immune system problems -low platelet count in the blood -seizures -an unusual or allergic reaction to pneumococcal vaccine, diphtheria toxoid, other vaccines, latex, other medicines, foods, dyes, or preservatives -pregnant or trying to get pregnant -breast-feeding How should I use this medicine? This vaccine is for injection into a muscle or under the skin. It is given by a health care professional. A copy of Vaccine Information Statements will be given before each vaccination. Read this sheet carefully each time. The sheet may change frequently. Talk to your pediatrician regarding the use of this medicine in children. While this drug may be prescribed for children as young as 68 years of age for selected conditions, precautions do apply. Overdosage: If you think you have taken too much of this medicine contact a poison control center or emergency room at once. NOTE: This medicine is only for you. Do not share this medicine with others. What if I miss a dose? It is important not to miss your dose. Call your doctor or health care professional if you are unable to keep an appointment. What may interact with this medicine? -medicines for cancer chemotherapy -medicines that suppress your immune function -medicines that treat or prevent blood clots like warfarin, enoxaparin, and dalteparin -steroid medicines like prednisone or cortisone This list may not describe all possible interactions. Give your health care provider a list of all the medicines, herbs, non-prescription drugs, or dietary supplements you use.  Also tell them if you smoke, drink alcohol, or use illegal drugs. Some items may interact with your medicine. What should I watch for while using this medicine? Mild fever and pain should go away in 3 days or less. Report any unusual symptoms to your doctor or health care professional. What side effects may I notice from receiving this medicine? Side effects that you should report to your doctor or health care professional as soon as possible: -allergic reactions like skin rash, itching or hives, swelling of the face, lips, or tongue -breathing problems -confused -fever over 102 degrees F -pain, tingling, numbness in the hands or feet -seizures -unusual bleeding or bruising -unusual muscle weakness Side effects that usually do not require medical attention (report to your doctor or health care professional if they continue or are bothersome): -aches and pains -diarrhea -fever of 102 degrees F or less -headache -irritable -loss of appetite -pain, tender at site where injected -trouble sleeping This list may not describe all possible side effects. Call your  doctor for medical advice about side effects. You may report side effects to FDA at 1-800-FDA-1088. Where should I keep my medicine? This does not apply. This vaccine is given in a clinic, pharmacy, doctor's office, or other health care setting and will not be stored at home. NOTE: This sheet is a summary. It may not cover all possible information. If you have questions about this medicine, talk to your doctor, pharmacist, or health care provider.    2016, Elsevier/Gold Standard. (2007-12-30 14:32:37)

## 2016-02-24 NOTE — Assessment & Plan Note (Signed)
Not doing great. Will increase abilify to 10mg  and recheck in 1 month. Refill of klonopin given today. Continue to monitor.

## 2016-03-05 ENCOUNTER — Other Ambulatory Visit: Payer: Self-pay | Admitting: Family Medicine

## 2016-03-19 ENCOUNTER — Encounter: Payer: Self-pay | Admitting: *Deleted

## 2016-03-20 ENCOUNTER — Other Ambulatory Visit: Payer: Self-pay | Admitting: Family Medicine

## 2016-03-23 NOTE — Discharge Instructions (Signed)
INSTRUCTIONS FOLLOWING OCULOPLASTIC SURGERY °AMY M. FOWLER, MD ° °AFTER YOUR EYE SURGERY, THER ARE MANY THINGS THWIHC YOU, THE PATIENT, CAN DO TO ASSURE THE BEST POSSIBLE RESULT FROM YOUR OPERATION.  THIS SHEET SHOULD BE REFERRED TO WHENEVER QUESTIONS ARISE.  IF THERE ARE ANY QUESTIONS NOT ANSWERED HERE, DO NOT HESITATE TO CALL OUR OFFICE AT [phone] OR [phone].  THERE IS ALWAYS [facility] AVAILABLE TO CALL IF QUESTIONS OR PROBLEMS ARISE. ° °VISION: Your vision may be blurred and out of focus after surgery until you are able to stop using your ointment, swelling resolves and your eye(s) heal. This may take 1 to 2 weeks at the least.  If your vision becomes gradually more dim or dark, this is not normal and you need to call our office immediately. ° °EYE CARE: For the first 48 hours after surgery, use ice packs frequently - “20 minutes on, 20 minutes off” - to help reduce swelling and bruising.  Small bags of frozen peas or corn make good ice packs along with cloths soaked in ice water.  If you are wearing a patch or other type of dressing following surgery, keep this on for the amount of time specified by your doctor.  For the first week following surgery, you will need to treat your stitches with great care.  If is OK to shower, but take care to not allow soapy water to run into your eye(s) to help reduce changes of infection.  You may gently clean the eyelashes and around the eye(s) with cotton balls and sterile water, BUT DO NOT RUB THE STITCHES VIGOROUSLY.  Keeping your stitches moist with ointment will help promote healing with minimal scar formation. ° °ACTIVITY: When you leave the surgery center, you should go home, rest and be inactive.  The eye(s) may feel scratchy and keeping the eyes closed will allow for faster healing.  The first week following surgery, avoid straining (anything making the face turn red) or lifting over 20 pounds.  Additionally, avoid bending which causes your head to go below  your waist.  Using your eyes will NOT harm them, so feel free to read, watch television, use the computer, etc as desired.  Driving depends on each individual, so check with your doctor if you have questions about driving. ° °MEDICATIONS:  You will be given a prescription for an ointment to use 4 times a day on your stitches.  You can use the ointment in your eyes if they feel scratchy or irritated.  If you eyelid(s) don’t close completely when you sleep, put some ointment in your eyes before bedtime. ° °EMERGENCY: If you experience SEVERE EYE PAIN OR HEADACHE UNRELIEVED BY TYLENOL OR PERCOCET, NAUSEA OR VOMITING, WORSENING REDNESS, OR WORSENING VISION (ESPECIALLY VISION THAT WA INITIALLY BETTER) CALL [phone] OR [phone] DURING BUSINESS HOURS OR AFTER HOURS. ° °General Anesthesia, Adult, Care After °Refer to this sheet in the next few weeks. These instructions provide you with information on caring for yourself after your procedure. Your health care provider may also give you more specific instructions. Your treatment has been planned according to current medical practices, but problems sometimes occur. Call your health care provider if you have any problems or questions after your procedure. °WHAT TO EXPECT AFTER THE PROCEDURE °After the procedure, it is typical to experience: °· Sleepiness. °· Nausea and vomiting. °HOME CARE INSTRUCTIONS °· For the first 24 hours after general anesthesia: °¨ Have a responsible person with you. °¨ Do not drive a car. If you   are alone, do not take public transportation. °¨ Do not drink alcohol. °¨ Do not take medicine that has not been prescribed by your health care provider. °¨ Do not sign important papers or make important decisions. °¨ You may resume a normal diet and activities as directed by your health care provider. °· Change bandages (dressings) as directed. °· If you have questions or problems that seem related to general anesthesia, call the hospital and ask for  the anesthetist or anesthesiologist on call. °SEEK MEDICAL CARE IF: °· You have nausea and vomiting that continue the day after anesthesia. °· You develop a rash. °SEEK IMMEDIATE MEDICAL CARE IF:  °· You have difficulty breathing. °· You have chest pain. °· You have any allergic problems. °  °This information is not intended to replace advice given to you by your health care provider. Make sure you discuss any questions you have with your health care provider. °  °Document Released: 08/31/2000 Document Revised: 06/15/2014 Document Reviewed: 09/23/2011 °Elsevier Interactive Patient Education ©2016 Elsevier Inc. ° °

## 2016-03-24 ENCOUNTER — Encounter
Admission: RE | Disposition: A | Discharge status: Home / Self Care | Payer: Self-pay | Source: Ambulatory Visit | Attending: Ophthalmology

## 2016-03-24 ENCOUNTER — Ambulatory Visit: Payer: BLUE CROSS/BLUE SHIELD | Admitting: Student in an Organized Health Care Education/Training Program

## 2016-03-24 ENCOUNTER — Ambulatory Visit
Admission: RE | Admit: 2016-03-24 | Discharge: 2016-03-24 | Disposition: A | Discharge status: Home / Self Care | Payer: BLUE CROSS/BLUE SHIELD | Source: Ambulatory Visit | Attending: Ophthalmology | Admitting: Ophthalmology

## 2016-03-24 DIAGNOSIS — J449 Chronic obstructive pulmonary disease, unspecified: Secondary | ICD-10-CM | POA: Diagnosis not present

## 2016-03-24 DIAGNOSIS — I509 Heart failure, unspecified: Secondary | ICD-10-CM | POA: Diagnosis not present

## 2016-03-24 DIAGNOSIS — F329 Major depressive disorder, single episode, unspecified: Secondary | ICD-10-CM | POA: Insufficient documentation

## 2016-03-24 DIAGNOSIS — H02834 Dermatochalasis of left upper eyelid: Secondary | ICD-10-CM | POA: Insufficient documentation

## 2016-03-24 DIAGNOSIS — G4730 Sleep apnea, unspecified: Secondary | ICD-10-CM | POA: Insufficient documentation

## 2016-03-24 DIAGNOSIS — H02831 Dermatochalasis of right upper eyelid: Secondary | ICD-10-CM | POA: Insufficient documentation

## 2016-03-24 DIAGNOSIS — K219 Gastro-esophageal reflux disease without esophagitis: Secondary | ICD-10-CM | POA: Insufficient documentation

## 2016-03-24 DIAGNOSIS — I110 Hypertensive heart disease with heart failure: Secondary | ICD-10-CM | POA: Insufficient documentation

## 2016-03-24 HISTORY — PX: BROW LIFT: SHX178

## 2016-03-24 SURGERY — BLEPHAROPLASTY
Anesthesia: Monitor Anesthesia Care | Laterality: Bilateral | Wound class: Clean

## 2016-03-24 MED ORDER — LACTATED RINGERS IV SOLN
INTRAVENOUS | Status: DC
  Administered 2016-03-24: 13:00:00 via INTRAVENOUS

## 2016-03-24 MED ORDER — ONDANSETRON HCL 4 MG/2ML IJ SOLN: 4.0000 mg | Freq: Once | INTRAMUSCULAR | Status: DC | PRN

## 2016-03-24 MED ORDER — OXYCODONE-ACETAMINOPHEN 5-325 MG PO TABS: 1.0000 | ORAL_TABLET | ORAL | 0 refills | Status: DC | PRN

## 2016-03-24 MED ORDER — ALFENTANIL 500 MCG/ML IJ INJ
INJECTION | INTRAMUSCULAR | Status: DC | PRN
  Administered 2016-03-24: 700 ug via INTRAVENOUS

## 2016-03-24 MED ORDER — PROPOFOL 500 MG/50ML IV EMUL
INTRAVENOUS | Status: DC | PRN
  Administered 2016-03-24: 50 ug/kg/min via INTRAVENOUS

## 2016-03-24 MED ORDER — BACITRACIN 500 UNIT/GM OP OINT
TOPICAL_OINTMENT | OPHTHALMIC | Status: DC | PRN
  Administered 2016-03-24: 1 via OPHTHALMIC

## 2016-03-24 MED ORDER — ONDANSETRON HCL 4 MG/2ML IJ SOLN
INTRAMUSCULAR | Status: DC | PRN
  Administered 2016-03-24: 4 mg via INTRAVENOUS

## 2016-03-24 MED ORDER — LIDOCAINE-EPINEPHRINE 2 %-1:100000 IJ SOLN
INTRAMUSCULAR | Status: DC | PRN
  Administered 2016-03-24: 3.5 mL via OPHTHALMIC

## 2016-03-24 MED ORDER — FENTANYL CITRATE (PF) 100 MCG/2ML IJ SOLN: 25.0000 ug | INTRAMUSCULAR | Status: DC | PRN

## 2016-03-24 MED ORDER — MIDAZOLAM HCL 2 MG/2ML IJ SOLN
INTRAMUSCULAR | Status: DC | PRN
  Administered 2016-03-24: 1 mg via INTRAVENOUS

## 2016-03-24 MED ORDER — TETRACAINE HCL 0.5 % OP SOLN
OPHTHALMIC | Status: DC | PRN
  Administered 2016-03-24: 2 [drp] via OPHTHALMIC

## 2016-03-24 MED ORDER — BACITRACIN 500 UNIT/GM OP OINT: TOPICAL_OINTMENT | OPHTHALMIC | 3 refills | Status: DC

## 2016-03-24 SURGICAL SUPPLY — 35 items
APPLICATOR COTTON TIP WD 3 STR (MISCELLANEOUS) ×2 IMPLANT
BLADE SURG 15 STRL LF DISP TIS (BLADE) ×1 IMPLANT
BLADE SURG 15 STRL SS (BLADE) ×1
CORD BIP STRL DISP 12FT (MISCELLANEOUS) ×2 IMPLANT
DRAPE HEAD BAR (DRAPES) ×2 IMPLANT
GAUZE SPONGE 4X4 12PLY STRL (GAUZE/BANDAGES/DRESSINGS) ×2 IMPLANT
GAUZE SPONGE NON-WVN 2X2 STRL (MISCELLANEOUS) ×10 IMPLANT
GLOVE SURG LX 7.0 MICRO (GLOVE) ×2
GLOVE SURG LX STRL 7.0 MICRO (GLOVE) ×2 IMPLANT
MARKER SKIN XFINE TIP W/RULER (MISCELLANEOUS) ×2 IMPLANT
NEEDLE FILTER BLUNT 18X 1/2SAF (NEEDLE) ×1
NEEDLE FILTER BLUNT 18X1 1/2 (NEEDLE) ×1 IMPLANT
NEEDLE HYPO 30X.5 LL (NEEDLE) ×4 IMPLANT
PACK DRAPE NASAL/ENT (PACKS) ×2 IMPLANT
SOL PREP PVP 2OZ (MISCELLANEOUS) ×2
SOLUTION PREP PVP 2OZ (MISCELLANEOUS) ×1 IMPLANT
SPONGE VERSALON 2X2 STRL (MISCELLANEOUS) ×10
SUT CHROMIC 4-0 (SUTURE)
SUT CHROMIC 4-0 M2 12X2 ARM (SUTURE)
SUT CHROMIC 5 0 P 3 (SUTURE) IMPLANT
SUT ETHILON 4 0 CL P 3 (SUTURE) IMPLANT
SUT MERSILENE 4-0 S-2 (SUTURE) IMPLANT
SUT PLAIN GUT (SUTURE) ×2 IMPLANT
SUT PROLENE 5 0 P 3 (SUTURE) IMPLANT
SUT PROLENE 6 0 P 1 18 (SUTURE) IMPLANT
SUT SILK 4 0 G 3 (SUTURE) IMPLANT
SUT VIC AB 5-0 P-3 18X BRD (SUTURE) IMPLANT
SUT VIC AB 5-0 P3 18 (SUTURE)
SUT VICRYL 6-0  S14 CTD (SUTURE)
SUT VICRYL 6-0 S14 CTD (SUTURE) IMPLANT
SUT VICRYL 7 0 TG140 8 (SUTURE) IMPLANT
SUTURE CHRMC 4-0 M2 12X2 ARM (SUTURE) IMPLANT
SYR 3ML LL SCALE MARK (SYRINGE) ×2 IMPLANT
SYRINGE 10CC LL (SYRINGE) ×2 IMPLANT
WATER STERILE IRR 500ML POUR (IV SOLUTION) ×2 IMPLANT

## 2016-03-24 NOTE — Anesthesia Postprocedure Evaluation (Signed)
Anesthesia Post Note  Patient: Hannah Kaiser  Procedure(s) Performed: Procedure(s) (LRB): BLEPHAROPLASTY (Bilateral)  Patient location during evaluation: PACU Anesthesia Type: MAC Level of consciousness: awake and alert Pain management: pain level controlled Vital Signs Assessment: post-procedure vital signs reviewed and stable Respiratory status: spontaneous breathing, nonlabored ventilation, respiratory function stable and patient connected to nasal cannula oxygen Cardiovascular status: stable and blood pressure returned to baseline Anesthetic complications: no    Amaryllis Dyke

## 2016-03-24 NOTE — Transfer of Care (Signed)
Immediate Anesthesia Transfer of Care Note  Patient: Hannah Kaiser  Procedure(s) Performed: Procedure(s) with comments: BLEPHAROPLASTY (Bilateral) - sleep apnea  Patient Location: PACU  Anesthesia Type: MAC  Level of Consciousness: awake, alert  and patient cooperative  Airway and Oxygen Therapy: Patient Spontanous Breathing and Patient connected to supplemental oxygen  Post-op Assessment: Post-op Vital signs reviewed, Patient's Cardiovascular Status Stable, Respiratory Function Stable, Patent Airway and No signs of Nausea or vomiting  Post-op Vital Signs: Reviewed and stable  Complications: No apparent anesthesia complications

## 2016-03-24 NOTE — H&P (Signed)
See the history and physical completed at [facility] on 03/11/16 and scanned into the chart.

## 2016-03-24 NOTE — Anesthesia Preprocedure Evaluation (Signed)
Anesthesia Evaluation  Patient identified by MRN, date of birth, ID band Patient awake    Reviewed: Allergy & Precautions, H&P , NPO status   History of Anesthesia Complications (+) PONV and history of anesthetic complications  Airway Mallampati: II  TM Distance: >3 FB Neck ROM: full    Dental   Pulmonary sleep apnea , COPD, Current Smoker,    + rhonchi        Cardiovascular hypertension, + Valvular Problems/Murmurs  Rhythm:regular Rate:Normal     Neuro/Psych PSYCHIATRIC DISORDERS    GI/Hepatic GERD  ,  Endo/Other    Renal/GU Renal disease     Musculoskeletal   Abdominal   Peds  Hematology   Anesthesia Other Findings   Reproductive/Obstetrics                             Anesthesia Physical Anesthesia Plan  ASA: III  Anesthesia Plan: MAC   Post-op Pain Management:    Induction:   Airway Management Planned:   Additional Equipment:   Intra-op Plan:   Post-operative Plan:   Informed Consent: I have reviewed the patients History and Physical, chart, labs and discussed the procedure including the risks, benefits and alternatives for the proposed anesthesia with the patient or authorized representative who has indicated his/her understanding and acceptance.     Plan Discussed with: CRNA  Anesthesia Plan Comments:         Anesthesia Quick Evaluation

## 2016-03-24 NOTE — Anesthesia Procedure Notes (Signed)
Procedure Name: MAC Performed by: Daleena Rotter Pre-anesthesia Checklist: Patient identified, Emergency Drugs available, Suction available, Patient being monitored and Timeout performed Patient Re-evaluated:Patient Re-evaluated prior to inductionOxygen Delivery Method: Nasal cannula Preoxygenation: Pre-oxygenation with 100% oxygen       

## 2016-03-24 NOTE — Interval H&P Note (Signed)
History and Physical Interval Note:  03/24/2016 1:02 PM  Hannah Kaiser  has presented today for surgery, with the diagnosis of H02.831 H02.834 DERMATOCHALASIS  The various methods of treatment have been discussed with the patient and family. After consideration of risks, benefits and other options for treatment, the patient has consented to  Procedure(s) with comments: BLEPHAROPLASTY (Bilateral) - sleep apnea as a surgical intervention .  The patient's history has been reviewed, patient examined, no change in status, stable for surgery.  I have reviewed the patient's chart and labs.  Questions were answered to the patient's satisfaction.     Vickki Muff, Ly Bacchi M

## 2016-03-24 NOTE — Op Note (Signed)
Preoperative Diagnosis:  Visually significant dermatochalasis both Upper Eyelid(s)  Postoperative Diagnosis:  Same.  Procedure(s) Performed:   Upper eyelid blepharoplasty with excess skin excision  bilateral Upper Eyelid(s)  Teaching Surgeon: Philis Pique. Vickki Muff, M.D.  Assistants: none  Anesthesia: MAC  Specimens: None.  Estimated Blood Loss: Minimal.  Complications: None.  Operative Findings: None Dictated  Procedure:   Allergies were reviewed and the patient is allergic to Ace inhibitors; Wellbutrin [bupropion]; Azithromycin; and Erythromycin.   After the risks, benefits, complications and alternatives were discussed with the patient, appropriate informed consent was obtained and the patient was brought to the operating suite. The patient was reclined supine and a timeout was conducted.  The patient was then sedated.  Local anesthetic consisting of a 50-50 mixture of 2% lidocaine with epinephrine and 0.75% bupivacaine with added Hylenex was injected subcutaneously to both upper eyelid(s). After adequate local was instilled, the patient was prepped and draped in the usual sterile fashion for eyelid surgery.   Attention was turned to the upper eyelids. A 57mm upper eyelid crease incision line was marked with calipers on both upper eyelid(s).  A pinch test was used to estimate the amount of excess skin to remove and this was marked in standard blepharoplasty style fashion. Attention was turned to the  right upper eyelid. A #15 blade was used to open the premarked incision line. A skin and muscle flap was excised and hemostasis was obtained with bipolar cautery.   A buttonhole was created medially in orbicularis and orbital septum to reveal the medial fat pocket. This was dissected free from fascial attachments, cauterized towards the pedicle base and excised to produce a nice flattening of the medial corner of the upper eyelid.   Attention was then turned to the opposite eyelid where the  same procedure was performed in the same manner. Hemostasis was obtained with bipolar cautery throughout. All incisions were then closed with a combination of running and interrupted 6-0 fast absorbing plain suture. The patient tolerated the procedure well.  Bacitracin Ophthalmic ointment was applied to her incision sites, followed by ice packs. She was taken to the recovery area where she recovered without difficulty.  Post-Op Plan/Instructions:  The patient was instructed to use ice packs frequently for the next 48 hours. She was instructed to use bacitracin ophthalmic ointment on her incisions 4 times a day for the next 12 to 14 days. She was given a prescription for Percocet for pain control should Tylenol not be effective. She was asked to to follow up in 2 weeks' time at the [facility] in [place], [place] or sooner as needed for problems.  Deshanna Kama M. Vickki Muff, M.D. Attending,Ophthalmology

## 2016-03-25 ENCOUNTER — Encounter: Payer: Self-pay | Admitting: Ophthalmology

## 2016-03-27 ENCOUNTER — Ambulatory Visit (INDEPENDENT_AMBULATORY_CARE_PROVIDER_SITE_OTHER): Payer: BLUE CROSS/BLUE SHIELD | Admitting: Family Medicine

## 2016-03-27 ENCOUNTER — Encounter: Payer: Self-pay | Admitting: Family Medicine

## 2016-03-27 VITALS — BP 148/85 | HR 60 | Temp 98.8°F | Wt 236.0 lb

## 2016-03-27 DIAGNOSIS — F331 Major depressive disorder, recurrent, moderate: Secondary | ICD-10-CM | POA: Diagnosis not present

## 2016-03-27 DIAGNOSIS — J029 Acute pharyngitis, unspecified: Secondary | ICD-10-CM | POA: Diagnosis not present

## 2016-03-27 DIAGNOSIS — J209 Acute bronchitis, unspecified: Secondary | ICD-10-CM

## 2016-03-27 LAB — CULTURE, GROUP A STREP

## 2016-03-27 LAB — RAPID STREP SCREEN (MED CTR MEBANE ONLY): STREP GP A AG, IA W/REFLEX: NEGATIVE

## 2016-03-27 MED ORDER — FLUOXETINE HCL 20 MG PO TABS: ORAL_TABLET | ORAL | 3 refills | Status: DC

## 2016-03-27 MED ORDER — PREGABALIN 100 MG PO CAPS: 100.0000 mg | ORAL_CAPSULE | Freq: Three times a day (TID) | ORAL | 1 refills | Status: DC

## 2016-03-27 MED ORDER — PREDNISONE 50 MG PO TABS: 50.0000 mg | ORAL_TABLET | Freq: Every day | ORAL | 0 refills | Status: DC

## 2016-03-27 MED ORDER — PREGABALIN 50 MG PO CAPS: 50.0000 mg | ORAL_CAPSULE | Freq: Three times a day (TID) | ORAL | 0 refills | Status: DC

## 2016-03-27 MED ORDER — DULOXETINE HCL 20 MG PO CPEP: 20.0000 mg | ORAL_CAPSULE | Freq: Every day | ORAL | 0 refills | Status: DC

## 2016-03-27 NOTE — Progress Notes (Signed)
BP (!) 148/85   Pulse 60   Temp 98.8 F (37.1 C)   Wt 236 lb (107 kg)   SpO2 98%   BMI 39.27 kg/m    Subjective:    Patient ID: Hannah Kaiser, female    DOB: 12/09/1960, 55 y.o.   MRN: [id number]  HPI: Hannah Kaiser is a 55 y.o. female     Chief Complaint  Patient presents with  . Sore Throat  . Anxiety   UPPER RESPIRATORY TRACT INFECTION Duration: 2.5 days Worst symptom: sore throat and pain in her L ear Fever: no Cough: yes Shortness of breath: yes Wheezing: yes Chest pain: yes, with cough Chest tightness: yes Chest congestion: no Nasal congestion: yes Runny nose: yes Post nasal drip: no Sneezing: no Sore throat: yes Swollen glands: no Sinus pressure: yes Headache: yes Face pain: yes Toothache: yes Ear pain: yesleft Ear pressure: yesleft Eyes red/itching:no Eye drainage/crusting: no Vomiting: no Rash: no Fatigue: yes Sick contacts: yes Strep contacts: no Context: worse Recurrent sinusitis: no Relief with OTC cold/cough medications: no Treatments attempted: none  ANXIETY/STRESS- prozac is better for her. She is no better on the cymbalta and would like to come off it and try something else for her neuropathy. Mood is not doing well and she would like to go back to her prozac. Duration:exacerbated Anxious mood: yes Excessive worrying: yes Irritability: no Sweating: no Nausea: no Palpitations:no Hyperventilation: no Panic attacks:no Agoraphobia: no Obscessions/compulsions: no Depressed mood: yes Depression screen [facility] 2/9 02/24/2016 12/03/2015  Decreased Interest 1 3  Down, Depressed, Hopeless 2 3  PHQ - 2 Score 3 6  Altered sleeping 3 3  Tired, decreased energy 3 3  Change in appetite 1 3  Feeling bad or failure about yourself  2 3  Trouble concentrating 3 3  Moving slowly or fidgety/restless 1 3  Suicidal thoughts 0 0  PHQ-9 Score 16 24  Difficult doing work/chores - Very difficult   GAD 7 : Generalized Anxiety Score  03/27/2016 02/24/2016  Nervous, Anxious, on Edge 2 3  Control/stop worrying 3 2  Worry too much - different things 3 2  Trouble relaxing 3 3  Restless 2 3  Easily annoyed or irritable 3 3  Afraid - awful might happen 3 3  Total GAD 7 Score 19 19  Anxiety Difficulty Very difficult Very difficult   Anhedonia: no Weight changes: yes Insomnia: yeshard to fall asleep Hypersomnia: no Fatigue/loss of energy: yes Feelings of worthlessness: yes Feelings of guilt: yes Impaired concentration/indecisiveness: yes Suicidal ideations: no Crying spells: yes Recent Stressors/Life Changes: yes Relationship problems: no Family stress: yes Financial stress:no Job stress: no Recent death/loss: no   Relevant past medical, surgical, family and social history reviewed and updated as indicated. Interim medical history since our last visit reviewed. Allergies and medications reviewed and updated.  Review of Systems  Constitutional: Negative.   Respiratory: Negative.   Cardiovascular: Negative.   Psychiatric/Behavioral: Negative.     Per HPI unless specifically indicated above     Objective:    BP (!) 148/85   Pulse 60   Temp 98.8 F (37.1 C)   Wt 236 lb (107 kg)   SpO2 98%   BMI 39.27 kg/m      Wt Readings from Last 3 Encounters:  03/27/16 236 lb (107 kg)  03/24/16 237 lb (107.5 kg)  02/24/16 227 lb (103 kg)    Physical Exam  Constitutional: She is oriented to person, place, and time. She appears  well-developed and well-nourished. No distress.  HENT:  Head: Normocephalic and atraumatic.  Right Ear: Hearing and external ear normal.  Left Ear: Hearing and external ear normal.  Nose: Nose normal.  Mouth/Throat: Oropharynx is clear and moist. No oropharyngeal exudate.  Eyes: Conjunctivae, EOM and lids are normal. Pupils are equal, round, and reactive to light. Right eye exhibits no discharge. Left eye exhibits no discharge. No scleral icterus.  Neck:  Normal range of motion. Neck supple. No JVD present. No tracheal deviation present. No thyromegaly present.  Cardiovascular: Normal rate, regular rhythm, normal heart sounds and intact distal pulses.  Exam reveals no gallop and no friction rub.   No murmur heard. Pulmonary/Chest: Effort normal. No stridor. No respiratory distress. She has wheezes. She has no rales. She exhibits no tenderness.  Musculoskeletal: Normal range of motion.  Lymphadenopathy:    She has no cervical adenopathy.  Neurological: She is alert and oriented to person, place, and time.  Skin: Skin is warm, dry and intact. No rash noted. She is not diaphoretic. No erythema. No pallor.  Psychiatric: She has a normal mood and affect. Her speech is normal and behavior is normal. Judgment and thought content normal. Cognition and memory are normal.  Nursing note and vitals reviewed.        Results for orders placed or performed in visit on 11/12/15  Bayer DCA Hb A1c Waived  Result Value Ref Range   Bayer DCA Hb A1c Waived 5.9 <7.0 %  Comprehensive metabolic panel  Result Value Ref Range   Glucose 97 65 - 99 mg/dL   BUN 18 6 - 24 mg/dL   Creatinine, Ser 1.02 (H) 0.57 - 1.00 mg/dL   GFR calc non Af Amer 63 >59 mL/min/1.73   GFR calc Af Amer 73 >59 mL/min/1.73   BUN/Creatinine Ratio 18 9 - 23   Sodium 142 134 - 144 mmol/L   Potassium 4.0 3.5 - 5.2 mmol/L   Chloride 101 96 - 106 mmol/L   CO2 25 18 - 29 mmol/L   Calcium 10.2 8.7 - 10.2 mg/dL   Total Protein 6.6 6.0 - 8.5 g/dL   Albumin 4.7 3.5 - 5.5 g/dL   Globulin, Total 1.9 1.5 - 4.5 g/dL   Albumin/Globulin Ratio 2.5 (H) 1.2 - 2.2   Bilirubin Total 0.6 0.0 - 1.2 mg/dL   Alkaline Phosphatase 60 39 - 117 IU/L   AST 18 0 - 40 IU/L   ALT 12 0 - 32 IU/L  Pathology Report  Result Value Ref Range   PATH REPORT.SITE OF ORIGIN SPEC Comment    . Comment    PATH REPORT.RELEVANT HX SPEC Comment    PATH REPORT.FINAL DX SPEC Comment    SIGNED  OUT BY: Comment    GROSS DESCRIPTION: Comment    . Comment    PAYMENT PROCEDURE Comment       Assessment & Plan:      Problem List Items Addressed This Visit          Other   Depression    Not doing well on her cymbalta. Will wean her off and will start her on lyrica for her neuropathy and get her back on her prozac for her depression. Recheck 1 month. Call with any concerns.      Relevant Medications   DULoxetine (CYMBALTA) 20 MG capsule   FLUoxetine (PROZAC) 20 MG tablet          Other Visit Diagnoses    Acute bronchitis, unspecified organism    -  Primary   Will treat with prednisone burst. Call if not getting better or getting worse.    Sore throat       Negative strep. Likely viral.    Relevant Orders   Rapid strep screen (not at [facility])       Follow up plan: Return in about 4 weeks (around 04/24/2016) for follow up mood and neuropathy.

## 2016-03-27 NOTE — Progress Notes (Deleted)
BP (!) 148/85   Pulse 60   Temp 98.8 F (37.1 C)   Wt 236 lb (107 kg)   SpO2 98%   BMI 39.27 kg/m    Subjective:    Patient ID: Hannah Kaiser, female    DOB: 07-28-1960, 55 y.o.   MRN: [id number]  HPI: Hannah Kaiser is a 55 y.o. female  Chief Complaint  Patient presents with  . Sore Throat  . Anxiety   UPPER RESPIRATORY TRACT INFECTION Duration: 2.5 days Worst symptom: sore throat and pain in her L ear Fever: no Cough: yes Shortness of breath: yes Wheezing: yes Chest pain: yes, with cough Chest tightness: yes Chest congestion: no Nasal congestion: yes Runny nose: yes Post nasal drip: no Sneezing: no Sore throat: yes Swollen glands: no Sinus pressure: yes Headache: yes Face pain: yes Toothache: yes Ear pain: yes left Ear pressure: yes left Eyes red/itching:no Eye drainage/crusting: no  Vomiting: no Rash: no Fatigue: yes Sick contacts: yes Strep contacts: no  Context: worse Recurrent sinusitis: no Relief with OTC cold/cough medications: no  Treatments attempted: none   ANXIETY/STRESS- prozac is better for her. She is no better on the cymbalta and would like to come off it and try something else for her neuropathy. Mood is not doing well and she would like to go back to her prozac. Duration:exacerbated Anxious mood: yes  Excessive worrying: yes Irritability: no  Sweating: no Nausea: no Palpitations:no Hyperventilation: no Panic attacks: no Agoraphobia: no  Obscessions/compulsions: no Depressed mood: yes Depression screen [facility] 2/9 02/24/2016 12/03/2015  Decreased Interest 1 3  Down, Depressed, Hopeless 2 3  PHQ - 2 Score 3 6  Altered sleeping 3 3  Tired, decreased energy 3 3  Change in appetite 1 3  Feeling bad or failure about yourself  2 3  Trouble concentrating 3 3  Moving slowly or fidgety/restless 1 3  Suicidal thoughts 0 0  PHQ-9 Score 16 24  Difficult doing work/chores - Very difficult   GAD 7 : Generalized Anxiety Score 03/27/2016  02/24/2016  Nervous, Anxious, on Edge 2 3  Control/stop worrying 3 2  Worry too much - different things 3 2  Trouble relaxing 3 3  Restless 2 3  Easily annoyed or irritable 3 3  Afraid - awful might happen 3 3  Total GAD 7 Score 19 19  Anxiety Difficulty Very difficult Very difficult   Anhedonia: no Weight changes: yes Insomnia: yes hard to fall asleep  Hypersomnia: no Fatigue/loss of energy: yes Feelings of worthlessness: yes Feelings of guilt: yes Impaired concentration/indecisiveness: yes Suicidal ideations: no  Crying spells: yes Recent Stressors/Life Changes: yes   Relationship problems: no   Family stress: yes     Financial stress: no    Job stress: no    Recent death/loss: no   Relevant past medical, surgical, family and social history reviewed and updated as indicated. Interim medical history since our last visit reviewed. Allergies and medications reviewed and updated.  Review of Systems  Constitutional: Negative.   Respiratory: Negative.   Cardiovascular: Negative.   Psychiatric/Behavioral: Negative.     Per HPI unless specifically indicated above     Objective:    BP (!) 148/85   Pulse 60   Temp 98.8 F (37.1 C)   Wt 236 lb (107 kg)   SpO2 98%   BMI 39.27 kg/m   Wt Readings from Last 3 Encounters:  03/27/16 236 lb (107 kg)  03/24/16 237 lb (107.5 kg)  02/24/16 227 lb (103 kg)    Physical Exam  Constitutional: She is oriented to person, place, and time. She appears well-developed and well-nourished. No distress.  HENT:  Head: Normocephalic and atraumatic.  Right Ear: Hearing and external ear normal.  Left Ear: Hearing and external ear normal.  Nose: Nose normal.  Mouth/Throat: Oropharynx is clear and moist. No oropharyngeal exudate.  Eyes: Conjunctivae, EOM and lids are normal. Pupils are equal, round, and reactive to light. Right eye exhibits no discharge. Left eye exhibits no discharge. No scleral icterus.  Neck: Normal range of motion.  Neck supple. No JVD present. No tracheal deviation present. No thyromegaly present.  Cardiovascular: Normal rate, regular rhythm, normal heart sounds and intact distal pulses.  Exam reveals no gallop and no friction rub.   No murmur heard. Pulmonary/Chest: Effort normal. No stridor. No respiratory distress. She has wheezes. She has no rales. She exhibits no tenderness.  Musculoskeletal: Normal range of motion.  Lymphadenopathy:    She has no cervical adenopathy.  Neurological: She is alert and oriented to person, place, and time.  Skin: Skin is warm, dry and intact. No rash noted. She is not diaphoretic. No erythema. No pallor.  Psychiatric: She has a normal mood and affect. Her speech is normal and behavior is normal. Judgment and thought content normal. Cognition and memory are normal.  Nursing note and vitals reviewed.   Results for orders placed or performed in visit on 11/12/15  Bayer DCA Hb A1c Waived  Result Value Ref Range   Bayer DCA Hb A1c Waived 5.9 <7.0 %  Comprehensive metabolic panel  Result Value Ref Range   Glucose 97 65 - 99 mg/dL   BUN 18 6 - 24 mg/dL   Creatinine, Ser 1.02 (H) 0.57 - 1.00 mg/dL   GFR calc non Af Amer 63 >59 mL/min/1.73   GFR calc Af Amer 73 >59 mL/min/1.73   BUN/Creatinine Ratio 18 9 - 23   Sodium 142 134 - 144 mmol/L   Potassium 4.0 3.5 - 5.2 mmol/L   Chloride 101 96 - 106 mmol/L   CO2 25 18 - 29 mmol/L   Calcium 10.2 8.7 - 10.2 mg/dL   Total Protein 6.6 6.0 - 8.5 g/dL   Albumin 4.7 3.5 - 5.5 g/dL   Globulin, Total 1.9 1.5 - 4.5 g/dL   Albumin/Globulin Ratio 2.5 (H) 1.2 - 2.2   Bilirubin Total 0.6 0.0 - 1.2 mg/dL   Alkaline Phosphatase 60 39 - 117 IU/L   AST 18 0 - 40 IU/L   ALT 12 0 - 32 IU/L  Pathology Report  Result Value Ref Range   PATH REPORT.SITE OF ORIGIN SPEC Comment    . Comment    PATH REPORT.RELEVANT HX SPEC Comment    PATH REPORT.FINAL DX SPEC Comment    SIGNED OUT BY: Comment    GROSS DESCRIPTION: Comment    . Comment      PAYMENT PROCEDURE Comment       Assessment & Plan:   Problem List Items Addressed This Visit      Other   Depression    Not doing well on her cymbalta. Will wean her off and will start her on lyrica for her neuropathy and get her back on her prozac for her depression. Recheck 1 month. Call with any concerns.       Relevant Medications   DULoxetine (CYMBALTA) 20 MG capsule   FLUoxetine (PROZAC) 20 MG tablet    Other Visit Diagnoses  Acute bronchitis, unspecified organism    -  Primary   Will treat with prednisone burst. Call if not getting better or getting worse.    Sore throat       Negative strep. Likely viral.    Relevant Orders   Rapid strep screen (not at [facility])       Follow up plan: Return in about 4 weeks (around 04/24/2016) for follow up mood and neuropathy.

## 2016-03-27 NOTE — Progress Notes (Deleted)
UPPER RESPIRATORY TRACT INFECTION Duration: 2.5 days Worst symptom: sore throat and pain in her L ear Fever: no Cough: yes Shortness of breath: yes Wheezing: yes Chest pain: yes, with cough Chest tightness: yes Chest congestion: no Nasal congestion: yes Runny nose: yes Post nasal drip: no Sneezing: no Sore throat: yes Swollen glands: no Sinus pressure: yes Headache: yes Face pain: yes Toothache: yes Ear pain: yes left Ear pressure: yes left Eyes red/itching:no Eye drainage/crusting: no  Vomiting: no Rash: no Fatigue: yes Sick contacts: yes Strep contacts: no  Context: worse Recurrent sinusitis: no Relief with OTC cold/cough medications: no  Treatments attempted: none   ANXIETY/STRESS- prozac is better for her. She is no better on  Duration:{Blank single:19197::"controlled","uncontrolled","better","worse","exacerbated","stable"} Anxious mood: {Blank single:19197::"yes","no"}  Excessive worrying: {Blank single:19197::"yes","no"} Irritability: {Blank single:19197::"yes","no"}  Sweating: {Blank single:19197::"yes","no"} Nausea: {Blank single:19197::"yes","no"} Palpitations:{Blank single:19197::"yes","no"} Hyperventilation: {Blank single:19197::"yes","no"} Panic attacks: {Blank single:19197::"yes","no"} Agoraphobia: {Blank single:19197::"yes","no"}  Obscessions/compulsions: {Blank single:19197::"yes","no"} Depressed mood: {Blank single:19197::"yes","no"} Depression screen [facility] 2/9 02/24/2016 12/03/2015  Decreased Interest 1 3  Down, Depressed, Hopeless 2 3  PHQ - 2 Score 3 6  Altered sleeping 3 3  Tired, decreased energy 3 3  Change in appetite 1 3  Feeling bad or failure about yourself  2 3  Trouble concentrating 3 3  Moving slowly or fidgety/restless 1 3  Suicidal thoughts 0 0  PHQ-9 Score 16 24  Difficult doing work/chores - Very difficult   Anhedonia: {Blank single:19197::"yes","no"} Weight changes: {Blank single:19197::"yes","no"} Insomnia: {Blank  single:19197::"yes","no"} {Blank single:19197::"hard to fall asleep","hard to stay asleep"}  Hypersomnia: {Blank single:19197::"yes","no"} Fatigue/loss of energy: {Blank single:19197::"yes","no"} Feelings of worthlessness: {Blank single:19197::"yes","no"} Feelings of guilt: {Blank single:19197::"yes","no"} Impaired concentration/indecisiveness: {Blank single:19197::"yes","no"} Suicidal ideations: {Blank single:19197::"yes","no"}  Crying spells: {Blank single:19197::"yes","no"} Recent Stressors/Life Changes: {Blank single:19197::"yes","no"}   Relationship problems: {Blank single:19197::"yes","no"}   Family stress: {Blank single:19197::"yes","no"}     Financial stress: {Blank single:19197::"yes","no"}    Job stress: {Blank single:19197::"yes","no"}    Recent death/loss: {Blank single:19197::"yes","no"}

## 2016-03-27 NOTE — Assessment & Plan Note (Addendum)
Not doing well on her cymbalta. Will wean her off and will start her on lyrica for her neuropathy and get her back on her prozac for her depression. Recheck 1 month. Call with any concerns.

## 2016-04-27 ENCOUNTER — Ambulatory Visit: Payer: BLUE CROSS/BLUE SHIELD | Admitting: Family Medicine

## 2016-05-14 ENCOUNTER — Other Ambulatory Visit: Payer: Self-pay | Admitting: Family Medicine

## 2016-06-04 ENCOUNTER — Other Ambulatory Visit: Payer: Self-pay

## 2016-06-04 MED ORDER — PREGABALIN 100 MG PO CAPS: 100.0000 mg | ORAL_CAPSULE | Freq: Three times a day (TID) | ORAL | 1 refills | Status: DC

## 2016-06-12 ENCOUNTER — Telehealth: Payer: Self-pay | Admitting: Family Medicine

## 2016-06-12 MED ORDER — CLONAZEPAM 0.5 MG PO TABS: 0.5000 mg | ORAL_TABLET | Freq: Every day | ORAL | 0 refills | Status: DC | PRN

## 2016-06-12 NOTE — Telephone Encounter (Signed)
Her mom had a heart attack last night and is refusing surgery. She is in the hospital. Hannah Kaiser would like a refill of her clonazepam.

## 2016-07-14 ENCOUNTER — Other Ambulatory Visit: Payer: Self-pay | Admitting: Family Medicine

## 2016-07-30 ENCOUNTER — Other Ambulatory Visit: Payer: Self-pay | Admitting: Family Medicine

## 2016-08-06 ENCOUNTER — Ambulatory Visit (INDEPENDENT_AMBULATORY_CARE_PROVIDER_SITE_OTHER): Payer: BLUE CROSS/BLUE SHIELD | Admitting: Family Medicine

## 2016-08-06 ENCOUNTER — Encounter: Payer: Self-pay | Admitting: Family Medicine

## 2016-08-06 VITALS — BP 145/90 | HR 64 | Temp 98.8°F | Resp 17 | Ht 63.5 in | Wt 223.0 lb

## 2016-08-06 DIAGNOSIS — K219 Gastro-esophageal reflux disease without esophagitis: Secondary | ICD-10-CM

## 2016-08-06 DIAGNOSIS — J0100 Acute maxillary sinusitis, unspecified: Secondary | ICD-10-CM | POA: Diagnosis not present

## 2016-08-06 DIAGNOSIS — F419 Anxiety disorder, unspecified: Secondary | ICD-10-CM | POA: Diagnosis not present

## 2016-08-06 DIAGNOSIS — R82998 Other abnormal findings in urine: Secondary | ICD-10-CM

## 2016-08-06 DIAGNOSIS — I129 Hypertensive chronic kidney disease with stage 1 through stage 4 chronic kidney disease, or unspecified chronic kidney disease: Secondary | ICD-10-CM

## 2016-08-06 DIAGNOSIS — R8299 Other abnormal findings in urine: Secondary | ICD-10-CM | POA: Diagnosis not present

## 2016-08-06 MED ORDER — CLONAZEPAM 0.5 MG PO TABS: 0.5000 mg | ORAL_TABLET | Freq: Every day | ORAL | 0 refills | Status: DC | PRN

## 2016-08-06 MED ORDER — AMOXICILLIN-POT CLAVULANATE 875-125 MG PO TABS: 1.0000 | ORAL_TABLET | Freq: Two times a day (BID) | ORAL | 0 refills | Status: DC

## 2016-08-06 NOTE — Progress Notes (Signed)
BP (!) 145/90   Pulse 64   Temp 98.8 F (37.1 C) (Oral)   Resp 17   Ht 5' 3.5" (1.613 m)   Wt 223 lb (101.2 kg)   SpO2 99%   BMI 38.88 kg/m    Subjective:    Patient ID: Hannah Kaiser, female    DOB: 1960-11-25, 56 y.o.   MRN: [id number]  HPI: Hannah Kaiser is a 56 y.o. female  Chief Complaint  Patient presents with  . Cough    onset 1 week  . Shortness of Breath    yesterday/ not as bad today   UPPER RESPIRATORY TRACT INFECTION Duration: 3-4 days Worst symptom: cough and SOB yesterday Fever: yes subjective Cough: yes Shortness of breath: yes Wheezing: no Chest pain: no Chest tightness: yes Chest congestion: yes Nasal congestion: yes Runny nose: no Post nasal drip: yes Sneezing: yes Sore throat: no Swollen glands: no Sinus pressure: no Headache: yes Face pain: no Toothache: yes Ear pain: no  Ear pressure: no  Eyes red/itching: yes Eye drainage/crusting: no  Vomiting: no Rash: no Fatigue: yes Sick contacts: yes Strep contacts: no  Context: worse Recurrent sinusitis: no Relief with OTC cold/cough medications: no  Treatments attempted: none   Having a lot of issues at home with her sister and her mom. Feels like she needs her klonopin again. Hasn't had any in about a year.   Also noticing that her urine has been foamy. Otherwise feeling well with no other concerns right now.   Relevant past medical, surgical, family and social history reviewed and updated as indicated. Interim medical history since our last visit reviewed. Allergies and medications reviewed and updated.  Review of Systems  Constitutional: Positive for unexpected weight change. Negative for activity change, appetite change, chills, diaphoresis, fatigue and fever.  HENT: Positive for congestion, postnasal drip, rhinorrhea, sinus pain, sinus pressure, sneezing and sore throat. Negative for dental problem, drooling, ear discharge, ear pain, facial swelling, hearing loss, mouth sores,  nosebleeds, tinnitus, trouble swallowing and voice change.   Respiratory: Positive for cough, chest tightness and shortness of breath. Negative for apnea, choking, wheezing and stridor.   Cardiovascular: Negative.   Genitourinary: Negative for decreased urine volume, difficulty urinating, dyspareunia, dysuria, enuresis, flank pain, frequency, genital sores, hematuria, menstrual problem, pelvic pain, urgency, vaginal bleeding, vaginal discharge and vaginal pain.  Psychiatric/Behavioral: Positive for decreased concentration, dysphoric mood and sleep disturbance. Negative for agitation, behavioral problems, confusion, hallucinations, self-injury and suicidal ideas. The patient is nervous/anxious. The patient is not hyperactive.     Per HPI unless specifically indicated above     Objective:    BP (!) 145/90   Pulse 64   Temp 98.8 F (37.1 C) (Oral)   Resp 17   Ht 5' 3.5" (1.613 m)   Wt 223 lb (101.2 kg)   SpO2 99%   BMI 38.88 kg/m   Wt Readings from Last 3 Encounters:  08/06/16 223 lb (101.2 kg)  03/27/16 236 lb (107 kg)  03/24/16 237 lb (107.5 kg)    Physical Exam  Constitutional: She is oriented to person, place, and time. She appears well-developed and well-nourished. No distress.  HENT:  Head: Normocephalic and atraumatic.  Right Ear: Hearing, tympanic membrane, external ear and ear canal normal.  Left Ear: Hearing, tympanic membrane, external ear and ear canal normal.  Nose: Mucosal edema and rhinorrhea present. Right sinus exhibits no maxillary sinus tenderness and no frontal sinus tenderness. Left sinus exhibits maxillary sinus tenderness. Left  sinus exhibits no frontal sinus tenderness.  Mouth/Throat: Oropharynx is clear and moist. No oropharyngeal exudate.  Eyes: Conjunctivae, EOM and lids are normal. Pupils are equal, round, and reactive to light. Right eye exhibits no discharge. Left eye exhibits no discharge. No scleral icterus.  Neck: Normal range of motion. Neck supple.  No JVD present. No tracheal deviation present. No thyromegaly present.  Cardiovascular: Normal rate, regular rhythm, normal heart sounds and intact distal pulses.  Exam reveals no gallop and no friction rub.   No murmur heard. Pulmonary/Chest: Effort normal and breath sounds normal. No stridor. No respiratory distress. She has no wheezes. She has no rales. She exhibits no tenderness.  Musculoskeletal: Normal range of motion.  Lymphadenopathy:    She has cervical adenopathy.  Neurological: She is alert and oriented to person, place, and time.  Skin: Skin is warm, dry and intact. No rash noted. She is not diaphoretic. No erythema. No pallor.  Psychiatric: She has a normal mood and affect. Her speech is normal and behavior is normal. Judgment and thought content normal. Cognition and memory are normal.  Nursing note and vitals reviewed.   Results for orders placed or performed in visit on 03/27/16  Rapid strep screen (not at [facility])  Result Value Ref Range   Strep Gp A Ag, IA W/Reflex Negative Negative  Culture, Group A Strep  Result Value Ref Range   Strep A Culture CANCELED       Assessment & Plan:   Problem List Items Addressed This Visit      Digestive   GERD (gastroesophageal reflux disease)    Stable. Checking labs today.      Relevant Orders   Comprehensive metabolic panel   CBC with Differential/Platelet   TSH   Microalbumin, Urine Waived     Genitourinary   Benign hypertensive renal disease    Elevated today, likely due to stress. Will work on diet and stress reduction and recheck in 1 month.       Relevant Orders   Comprehensive metabolic panel   CBC with Differential/Platelet   TSH   Microalbumin, Urine Waived    Other Visit Diagnoses    Acute non-recurrent maxillary sinusitis    -  Primary   Will treat with augmentin. Call with any concerns.    Relevant Medications   amoxicillin-clavulanate (AUGMENTIN) 875-125 MG tablet   Foamy urine       Checking urine.  Await results.    Relevant Orders   UA/M w/rflx Culture, Routine   Comprehensive metabolic panel   CBC with Differential/Platelet   TSH   Microalbumin, Urine Waived   Acute anxiety       Refill of her klonopin given today.    Relevant Orders   Comprehensive metabolic panel   CBC with Differential/Platelet   TSH   Microalbumin, Urine Waived       Follow up plan: Return in about 4 weeks (around 09/03/2016) for Physical.

## 2016-08-06 NOTE — Assessment & Plan Note (Signed)
Elevated today, likely due to stress. Will work on diet and stress reduction and recheck in 1 month.

## 2016-08-06 NOTE — Assessment & Plan Note (Signed)
Stable. Checking labs today. 

## 2016-08-07 LAB — UA/M W/RFLX CULTURE, ROUTINE
BILIRUBIN UA: NEGATIVE
Glucose, UA: NEGATIVE
KETONES UA: NEGATIVE
Leukocytes, UA: NEGATIVE
Nitrite, UA: NEGATIVE
PH UA: 6.5 (ref 5.0–7.5)
PROTEIN UA: NEGATIVE
Specific Gravity, UA: 1.02 (ref 1.005–1.030)
UUROB: 0.2 mg/dL (ref 0.2–1.0)

## 2016-08-07 LAB — COMPREHENSIVE METABOLIC PANEL
ALT: 9 IU/L (ref 0–32)
AST: 13 IU/L (ref 0–40)
Albumin/Globulin Ratio: 2.3 — ABNORMAL HIGH (ref 1.2–2.2)
Albumin: 4.5 g/dL (ref 3.5–5.5)
Alkaline Phosphatase: 74 IU/L (ref 39–117)
BILIRUBIN TOTAL: 0.7 mg/dL (ref 0.0–1.2)
BUN/Creatinine Ratio: 12 (ref 9–23)
BUN: 11 mg/dL (ref 6–24)
CALCIUM: 9.7 mg/dL (ref 8.7–10.2)
CHLORIDE: 104 mmol/L (ref 96–106)
CO2: 22 mmol/L (ref 18–29)
Creatinine, Ser: 0.92 mg/dL (ref 0.57–1.00)
GFR calc non Af Amer: 71 mL/min/{1.73_m2} (ref >59)
GFR, EST AFRICAN AMERICAN: 82 mL/min/{1.73_m2} (ref >59)
GLUCOSE: 80 mg/dL (ref 65–99)
Globulin, Total: 2 g/dL (ref 1.5–4.5)
Potassium: 4.6 mmol/L (ref 3.5–5.2)
Sodium: 142 mmol/L (ref 134–144)
TOTAL PROTEIN: 6.5 g/dL (ref 6.0–8.5)

## 2016-08-07 LAB — CBC WITH DIFFERENTIAL/PLATELET
BASOS ABS: 0 10*3/uL (ref 0.0–0.2)
Basos: 1 %
EOS (ABSOLUTE): 0.1 10*3/uL (ref 0.0–0.4)
Eos: 3 %
HEMOGLOBIN: 13.4 g/dL (ref 11.1–15.9)
Hematocrit: 39.2 % (ref 34.0–46.6)
IMMATURE GRANS (ABS): 0 10*3/uL (ref 0.0–0.1)
IMMATURE GRANULOCYTES: 0 %
LYMPHS: 39 %
Lymphocytes Absolute: 2 10*3/uL (ref 0.7–3.1)
MCH: 29.5 pg (ref 26.6–33.0)
MCHC: 34.2 g/dL (ref 31.5–35.7)
MCV: 86 fL (ref 79–97)
MONOCYTES: 9 %
Monocytes Absolute: 0.4 10*3/uL (ref 0.1–0.9)
NEUTROS ABS: 2.5 10*3/uL (ref 1.4–7.0)
NEUTROS PCT: 48 %
PLATELETS: 225 10*3/uL (ref 150–379)
RBC: 4.55 x10E6/uL (ref 3.77–5.28)
RDW: 14.2 % (ref 12.3–15.4)
WBC: 5 10*3/uL (ref 3.4–10.8)

## 2016-08-07 LAB — MICROALBUMIN, URINE WAIVED
CREATININE, URINE WAIVED: 200 mg/dL (ref 10–300)
MICROALB, UR WAIVED: 10 mg/L (ref 0–19)

## 2016-08-07 LAB — MICROSCOPIC EXAMINATION

## 2016-08-07 LAB — TSH: TSH: 0.57 u[IU]/mL (ref 0.450–4.500)

## 2016-08-16 ENCOUNTER — Other Ambulatory Visit: Payer: Self-pay | Admitting: Family Medicine

## 2016-09-12 ENCOUNTER — Other Ambulatory Visit: Payer: Self-pay | Admitting: Family Medicine

## 2016-09-22 ENCOUNTER — Other Ambulatory Visit: Payer: Self-pay | Admitting: Family Medicine

## 2016-09-22 ENCOUNTER — Ambulatory Visit (INDEPENDENT_AMBULATORY_CARE_PROVIDER_SITE_OTHER): Payer: BLUE CROSS/BLUE SHIELD | Admitting: Family Medicine

## 2016-09-22 ENCOUNTER — Encounter: Payer: Self-pay | Admitting: Family Medicine

## 2016-09-22 VITALS — BP 126/72 | HR 62 | Temp 98.8°F | Wt 216.8 lb

## 2016-09-22 DIAGNOSIS — N8110 Cystocele, unspecified: Secondary | ICD-10-CM | POA: Diagnosis not present

## 2016-09-22 DIAGNOSIS — Z0000 Encounter for general adult medical examination without abnormal findings: Secondary | ICD-10-CM

## 2016-09-22 DIAGNOSIS — K219 Gastro-esophageal reflux disease without esophagitis: Secondary | ICD-10-CM

## 2016-09-22 DIAGNOSIS — Z1239 Encounter for other screening for malignant neoplasm of breast: Secondary | ICD-10-CM

## 2016-09-22 DIAGNOSIS — J069 Acute upper respiratory infection, unspecified: Secondary | ICD-10-CM

## 2016-09-22 DIAGNOSIS — J029 Acute pharyngitis, unspecified: Secondary | ICD-10-CM

## 2016-09-22 DIAGNOSIS — Z1211 Encounter for screening for malignant neoplasm of colon: Secondary | ICD-10-CM

## 2016-09-22 DIAGNOSIS — F332 Major depressive disorder, recurrent severe without psychotic features: Secondary | ICD-10-CM

## 2016-09-22 DIAGNOSIS — Z1231 Encounter for screening mammogram for malignant neoplasm of breast: Secondary | ICD-10-CM | POA: Diagnosis not present

## 2016-09-22 DIAGNOSIS — I129 Hypertensive chronic kidney disease with stage 1 through stage 4 chronic kidney disease, or unspecified chronic kidney disease: Secondary | ICD-10-CM

## 2016-09-22 DIAGNOSIS — Z13220 Encounter for screening for lipoid disorders: Secondary | ICD-10-CM

## 2016-09-22 DIAGNOSIS — J449 Chronic obstructive pulmonary disease, unspecified: Secondary | ICD-10-CM

## 2016-09-22 MED ORDER — FLUOXETINE HCL 40 MG PO CAPS: 80.0000 mg | ORAL_CAPSULE | Freq: Every day | ORAL | 1 refills | Status: DC

## 2016-09-22 MED ORDER — FLUOXETINE HCL 20 MG PO TABS: 80.0000 mg | ORAL_TABLET | Freq: Every day | ORAL | 1 refills | Status: DC

## 2016-09-22 MED ORDER — ATENOLOL 25 MG PO TABS: 25.0000 mg | ORAL_TABLET | Freq: Every day | ORAL | 1 refills | Status: DC

## 2016-09-22 MED ORDER — FUROSEMIDE 20 MG PO TABS: ORAL_TABLET | ORAL | 3 refills | Status: DC

## 2016-09-22 MED ORDER — PREGABALIN 100 MG PO CAPS: 100.0000 mg | ORAL_CAPSULE | Freq: Three times a day (TID) | ORAL | 1 refills | Status: DC

## 2016-09-22 MED ORDER — TIZANIDINE HCL 4 MG PO TABS: 4.0000 mg | ORAL_TABLET | Freq: Every day | ORAL | 2 refills | Status: DC

## 2016-09-22 MED ORDER — VALACYCLOVIR HCL 1 G PO TABS: 1000.0000 mg | ORAL_TABLET | Freq: Every day | ORAL | 1 refills | Status: DC

## 2016-09-22 MED ORDER — CLONAZEPAM 0.5 MG PO TABS: 0.5000 mg | ORAL_TABLET | Freq: Every day | ORAL | 0 refills | Status: DC | PRN

## 2016-09-22 MED ORDER — LOSARTAN POTASSIUM 100 MG PO TABS: 100.0000 mg | ORAL_TABLET | Freq: Every day | ORAL | 1 refills | Status: DC

## 2016-09-22 MED ORDER — AMLODIPINE BESYLATE 5 MG PO TABS: 5.0000 mg | ORAL_TABLET | Freq: Every day | ORAL | 1 refills | Status: DC

## 2016-09-22 MED ORDER — OMEPRAZOLE 40 MG PO CPDR: 40.0000 mg | DELAYED_RELEASE_CAPSULE | Freq: Every day | ORAL | 6 refills | Status: DC

## 2016-09-22 MED ORDER — HYDROCHLOROTHIAZIDE 25 MG PO TABS: 25.0000 mg | ORAL_TABLET | Freq: Every day | ORAL | 1 refills | Status: DC

## 2016-09-22 NOTE — Assessment & Plan Note (Signed)
Better on recheck. Continue current regimen. Continue to monitor.  

## 2016-09-22 NOTE — Assessment & Plan Note (Signed)
Not under good control. Will get her into psychiatry. Referral generated today. Call with any concerns.

## 2016-09-22 NOTE — Assessment & Plan Note (Signed)
Stable. Continue to monitor. Work on quitting smoking. Call with any concerns.

## 2016-09-22 NOTE — Progress Notes (Signed)
BP 126/72   Pulse 62   Temp 98.8 F (37.1 C)   Wt 216 lb 12.8 oz (98.3 kg)   SpO2 97%   BMI 37.80 kg/m    Subjective:    Patient ID: Hannah Kaiser, female    DOB: 11/03/1960, 56 y.o.   MRN: [id number]  HPI: Hannah Kaiser is a 56 y.o. female presenting on 09/22/2016 for comprehensive medical examination. Current medical complaints include:  HYPERTENSION Hypertension status: controlled  Satisfied with current treatment? yes Duration of hypertension: chronic BP monitoring frequency:  not checking BP medication side effects:  no Medication compliance: excellent compliance Aspirin: yes Recurrent headaches: yes Visual changes: yes Palpitations: yes Dyspnea: yes Chest pain: yes Lower extremity edema: yes Dizzy/lightheaded: yes  UPPER RESPIRATORY TRACT INFECTION Duration: 1 week Worst symptom: sore throat Fever: yes Cough: yes Shortness of breath: yes Wheezing: yes Chest pain: yes Chest tightness: yes Chest congestion: yes Nasal congestion: no Runny nose: no Post nasal drip: no Sneezing: no Sore throat: yes Swollen glands: yes Sinus pressure: no Headache: yes Face pain: no Toothache: yes Ear pain: no  Ear pressure: no  Eyes red/itching:yes Eye drainage/crusting: no  Vomiting: no Rash: no Fatigue: yes Sick contacts: yes Strep contacts: no  Context: worse Recurrent sinusitis: no Relief with OTC cold/cough medications: no  Treatments attempted: none   DEPRESSION- broke up with her boyfriend, helping with family and feeling overwhelmed Mood status: exacerbated Satisfied with current treatment?: no Symptom severity: severe  Duration of current treatment : chronic Side effects: no Medication compliance: excellent compliance Psychotherapy/counseling: no  Previous psychiatric medications: prozac, cymbalta, wellbutrin, abilify Depressed mood: yes Anxious mood: yes Anhedonia: no Significant weight loss or gain: no Insomnia: yes hard to stay asleep Fatigue:  yes Feelings of worthlessness or guilt: yes Impaired concentration/indecisiveness: yes Suicidal ideations: no Hopelessness: yes Crying spells: yes Depression screen [facility] 2/9 09/22/2016 03/27/2016 02/24/2016 12/03/2015  Decreased Interest 3 3 1 3   Down, Depressed, Hopeless 3 3 2 3   PHQ - 2 Score 6 6 3 6   Altered sleeping 3 3 3 3   Tired, decreased energy 3 3 3 3   Change in appetite 3 3 1 3   Feeling bad or failure about yourself  3 3 2 3   Trouble concentrating 3 2 3 3   Moving slowly or fidgety/restless 3 3 1 3   Suicidal thoughts 3 3 0 0  PHQ-9 Score 27 26 16 24   Difficult doing work/chores - - - Very difficult   She currently lives with: sister, brother, and mother Menopausal Symptoms: yes   Past Medical History:  Past Medical History:  Diagnosis Date  . Arthritis    knees  . Breast pain   . Colon polyps   . COPD (chronic obstructive pulmonary disease) ([place])   . Depression   . GERD (gastroesophageal reflux disease)   . Heart murmur   . History of blood transfusion   . Hypertension   . Motion sickness    cars  . Multilevel degenerative disc disease   . Nipple discharge   . Numbness of both lower extremities    bulging lumbar discs - per pt  . Numbness of upper extremity    bilateral - degenerative cervical discs - per pt  . PONV (postoperative nausea and vomiting)   . Sleep apnea    has not received CPAP machine yet  . Smokers' cough ([place])   . Wears dentures    full upper and lower    Surgical History:  Past Surgical History:  Procedure Laterality Date  . ABDOMINAL HYSTERECTOMY  2014  . BROW LIFT Bilateral 03/24/2016   Procedure: BLEPHAROPLASTY;  Surgeon: Karle Starch, MD;  Location: [place];  Service: Ophthalmology;  Laterality: Bilateral;  sleep apnea  . ECTOPIC PREGNANCY SURGERY     x2  . NECK SURGERY  2003  . [place]    Medications:  Current Outpatient Prescriptions on File Prior to Visit  Medication Sig  . albuterol  (PROVENTIL HFA;VENTOLIN HFA) 108 (90 BASE) MCG/ACT inhaler Inhale 2 puffs into the lungs every 6 (six) hours as needed for wheezing or shortness of breath.  . bacitracin ophthalmic ointment Use on sutures 4 times a day for 12-14 days  . latanoprost (XALATAN) 0.005 % ophthalmic solution Place 1 drop into both eyes at bedtime.  Marland Kitchen nystatin-triamcinolone ointment (MYCOLOG) Apply 1 application topically 2 (two) times daily.  . traMADol (ULTRAM) 50 MG tablet Take 50 mg by mouth every 6 (six) hours as needed (1-2 every 6 hour prn).   No current facility-administered medications on file prior to visit.     Allergies:  Allergies  Allergen Reactions  . Abilify [Aripiprazole] Swelling  . Ace Inhibitors Cough    So bad she vomited  . Wellbutrin [Bupropion] Other (See Comments)    depression  . Azithromycin Rash  . Erythromycin Rash    Social History:  Social History   Social History  . Marital status: Legally Separated    Spouse name: N/A  . Number of children: N/A  . Years of education: N/A   Occupational History  . Not on file.   Social History Main Topics  . Smoking status: Current Every Day Smoker    Packs/day: 1.50    Years: 41.00  . Smokeless tobacco: Never Used     Comment: since age 73  . Alcohol use No  . Drug use: No  . Sexual activity: Not on file   Other Topics Concern  . Not on file   Social History Narrative  . No narrative on file   History  Smoking Status  . Current Every Day Smoker  . Packs/day: 1.50  . Years: 41.00  Smokeless Tobacco  . Never Used    Comment: since age 66   History  Alcohol Use No    Family History:  Family History  Problem Relation Age of Onset  . Cancer Brother     melanoma  . Cancer Maternal Grandfather     prostate  . Cancer Maternal Uncle     lung    Past medical history, surgical history, medications, allergies, family history and social history reviewed with patient today and changes made to appropriate areas of  the chart.   Review of Systems  Constitutional: Positive for fever and malaise/fatigue. Negative for chills, diaphoresis and weight loss.  HENT: Positive for congestion, sinus pain and sore throat. Negative for ear discharge, ear pain, hearing loss, nosebleeds and tinnitus.   Eyes: Positive for blurred vision. Negative for double vision, photophobia, pain, discharge and redness.  Respiratory: Positive for cough, shortness of breath and wheezing. Negative for hemoptysis, sputum production and stridor.   Cardiovascular: Positive for chest pain, palpitations and leg swelling. Negative for orthopnea, claudication and PND.  Gastrointestinal: Positive for abdominal pain, diarrhea, heartburn and nausea. Negative for blood in stool, constipation, melena and vomiting.  Genitourinary: Negative.   Musculoskeletal: Negative.   Skin: Negative.   Neurological: Positive for dizziness and tingling. Negative for  tremors, sensory change, speech change, focal weakness, seizures, loss of consciousness, weakness and headaches.  Endo/Heme/Allergies: Positive for environmental allergies and polydipsia. Bruises/bleeds easily.  Psychiatric/Behavioral: Positive for depression. Negative for hallucinations, memory loss, substance abuse and suicidal ideas. The patient is nervous/anxious and has insomnia.     All other ROS negative except what is listed above and in the HPI.      Objective:    BP 126/72   Pulse 62   Temp 98.8 F (37.1 C)   Wt 216 lb 12.8 oz (98.3 kg)   SpO2 97%   BMI 37.80 kg/m   Wt Readings from Last 3 Encounters:  09/22/16 216 lb 12.8 oz (98.3 kg)  08/06/16 223 lb (101.2 kg)  03/27/16 236 lb (107 kg)    Physical Exam  Constitutional: She is oriented to person, place, and time. She appears well-developed and well-nourished. No distress.  HENT:  Head: Normocephalic and atraumatic.  Right Ear: Hearing, tympanic membrane, external ear and ear canal normal.  Left Ear: Hearing, tympanic  membrane, external ear and ear canal normal.  Nose: Nose normal.  Mouth/Throat: Uvula is midline and mucous membranes are normal. Posterior oropharyngeal edema and posterior oropharyngeal erythema present. No oropharyngeal exudate.  Eyes: Conjunctivae, EOM and lids are normal. Pupils are equal, round, and reactive to light. Right eye exhibits no discharge. Left eye exhibits no discharge. No scleral icterus.  Neck: Normal range of motion. Neck supple. No JVD present. No tracheal deviation present. No thyromegaly present.  Cardiovascular: Normal rate, regular rhythm, normal heart sounds and intact distal pulses.  Exam reveals no gallop and no friction rub.   No murmur heard. Pulmonary/Chest: Effort normal and breath sounds normal. No stridor. No respiratory distress. She has no wheezes. She has no rales. She exhibits no tenderness. Right breast exhibits no inverted nipple, no mass, no nipple discharge, no skin change and no tenderness. Left breast exhibits no inverted nipple, no mass, no nipple discharge, no skin change and no tenderness. Breasts are symmetrical.  Abdominal: Soft. Bowel sounds are normal. She exhibits no distension and no mass. There is no tenderness. There is no rebound and no guarding.  Genitourinary:  Genitourinary Comments: Pelvic deferred with shared decision making  Musculoskeletal: Normal range of motion. She exhibits no edema, tenderness or deformity.  Lymphadenopathy:    She has cervical adenopathy.  Neurological: She is alert and oriented to person, place, and time. She has normal reflexes. She displays normal reflexes. No cranial nerve deficit. She exhibits normal muscle tone. Coordination normal.  Skin: Skin is warm, dry and intact. No rash noted. She is not diaphoretic. No erythema. No pallor.  Psychiatric: She has a normal mood and affect. Her speech is normal and behavior is normal. Judgment and thought content normal. Cognition and memory are normal.  Nursing note and  vitals reviewed.   Results for orders placed or performed in visit on 08/06/16  Microscopic Examination  Result Value Ref Range   RBC, UA 3-10 (A) 0 - 2 /hpf   Epithelial Cells (non renal) 0-10 0 - 10 /hpf   Bacteria, UA Few None seen/Few  UA/M w/rflx Culture, Routine  Result Value Ref Range   Specific Gravity, UA 1.020 1.005 - 1.030   pH, UA 6.5 5.0 - 7.5   Color, UA Yellow Yellow   Appearance Ur Clear Clear   Leukocytes, UA Negative Negative   Protein, UA Negative Negative/Trace   Glucose, UA Negative Negative   Ketones, UA Negative Negative  RBC, UA Trace (A) Negative   Bilirubin, UA Negative Negative   Urobilinogen, Ur 0.2 0.2 - 1.0 mg/dL   Nitrite, UA Negative Negative   Microscopic Examination See below:   Comprehensive metabolic panel  Result Value Ref Range   Glucose 80 65 - 99 mg/dL   BUN 11 6 - 24 mg/dL   Creatinine, Ser 0.92 0.57 - 1.00 mg/dL   GFR calc non Af Amer 71 >59 mL/min/1.73   GFR calc Af Amer 82 >59 mL/min/1.73   BUN/Creatinine Ratio 12 9 - 23   Sodium 142 134 - 144 mmol/L   Potassium 4.6 3.5 - 5.2 mmol/L   Chloride 104 96 - 106 mmol/L   CO2 22 18 - 29 mmol/L   Calcium 9.7 8.7 - 10.2 mg/dL   Total Protein 6.5 6.0 - 8.5 g/dL   Albumin 4.5 3.5 - 5.5 g/dL   Globulin, Total 2.0 1.5 - 4.5 g/dL   Albumin/Globulin Ratio 2.3 (H) 1.2 - 2.2   Bilirubin Total 0.7 0.0 - 1.2 mg/dL   Alkaline Phosphatase 74 39 - 117 IU/L   AST 13 0 - 40 IU/L   ALT 9 0 - 32 IU/L  CBC with Differential/Platelet  Result Value Ref Range   WBC 5.0 3.4 - 10.8 x10E3/uL   RBC 4.55 3.77 - 5.28 x10E6/uL   Hemoglobin 13.4 11.1 - 15.9 g/dL   Hematocrit 39.2 34.0 - 46.6 %   MCV 86 79 - 97 fL   MCH 29.5 26.6 - 33.0 pg   MCHC 34.2 31.5 - 35.7 g/dL   RDW 14.2 12.3 - 15.4 %   Platelets 225 150 - 379 x10E3/uL   Neutrophils 48 Not Estab. %   Lymphs 39 Not Estab. %   Monocytes 9 Not Estab. %   Eos 3 Not Estab. %   Basos 1 Not Estab. %   Neutrophils Absolute 2.5 1.4 - 7.0 x10E3/uL    Lymphocytes Absolute 2.0 0.7 - 3.1 x10E3/uL   Monocytes Absolute 0.4 0.1 - 0.9 x10E3/uL   EOS (ABSOLUTE) 0.1 0.0 - 0.4 x10E3/uL   Basophils Absolute 0.0 0.0 - 0.2 x10E3/uL   Immature Granulocytes 0 Not Estab. %   Immature Grans (Abs) 0.0 0.0 - 0.1 x10E3/uL  TSH  Result Value Ref Range   TSH 0.570 0.450 - 4.500 uIU/mL  Microalbumin, Urine Waived  Result Value Ref Range   Microalb, Ur Waived 10 0 - 19 mg/L   Creatinine, Urine Waived 200 10 - 300 mg/dL   Microalb/Creat Ratio <30 <30 mg/g      Assessment & Plan:   Problem List Items Addressed This Visit      Respiratory   COPD (chronic obstructive pulmonary disease) ([facility])    Stable. Continue to monitor. Work on quitting smoking. Call with any concerns.       Relevant Orders   CBC with Differential/Platelet   Comprehensive metabolic panel     Digestive   GERD (gastroesophageal reflux disease)    Still not under great control. Will get her into see GI- await their input.       Relevant Medications   omeprazole (PRILOSEC) 40 MG capsule   Other Relevant Orders   CBC with Differential/Platelet   Comprehensive metabolic panel   Ambulatory referral to Gastroenterology     Genitourinary   Benign hypertensive renal disease    Better on recheck. Continue current regimen. Continue to monitor.       Relevant Orders   Comprehensive metabolic panel   Microalbumin,  Urine Waived   UA/M w/rflx Culture, Routine   Bladder prolapse, female, acquired    Has not heard from uro-gyn yet. Referral generated again today.      Relevant Orders   Ambulatory referral to Urogynecology     Other   Depression    Not under good control. Will get her into psychiatry. Referral generated today. Call with any concerns.       Relevant Medications   FLUoxetine (PROZAC) 20 MG tablet   Other Relevant Orders   Comprehensive metabolic panel   TSH   Ambulatory referral to Psychiatry    Other Visit Diagnoses    Routine general medical examination  at a health care facility    -  Primary   Vaccines up to date. Screening labs checked today. Colonoscopy and mammogram ordered today. Pap N/A. Continue diet and exercise.    Relevant Orders   CBC with Differential/Platelet   Comprehensive metabolic panel   Lipid Panel w/o Chol/HDL Ratio   Microalbumin, Urine Waived   TSH   UA/M w/rflx Culture, Routine   Hgb A1c w/o eAG   Screening for cholesterol level       Labs drawn today. Await results.    Relevant Orders   Lipid Panel w/o Chol/HDL Ratio   Sore throat       Will check strep swab. Await results.    Relevant Orders   Rapid strep screen (not at [facility])   Screening for colon cancer       Order for colonoscopy ordered today.   Relevant Orders   Ambulatory referral to Gastroenterology   Screening for breast cancer       Mammogram ordered today.   Relevant Orders   MM DIGITAL SCREENING BILATERAL   Upper respiratory tract infection, unspecified type       Strep negative. Lungs clear. Rest and fluids. Let us know if not getting better or getting worse.    Relevant Medications   valACYclovir (VALTREX) 1000 MG tablet       Follow up plan: Return 1-2 months, for follow up.   LABORATORY TESTING:  - Pap smear: not applicable  IMMUNIZATIONS:   - Tdap: Tetanus vaccination status reviewed: last tetanus booster within 10 years. - Influenza: Up to date - Pneumovax: Up to date - Prevnar: Not applicable - Zostavax vaccine: Refused  SCREENING: -Mammogram: Ordered today  - Colonoscopy: Ordered today   PATIENT COUNSELING:   Advised to take 1 mg of folate supplement per day if capable of pregnancy.   Sexuality: Discussed sexually transmitted diseases, partner selection, use of condoms, avoidance of unintended pregnancy  and contraceptive alternatives.   Advised to avoid cigarette smoking.  I discussed with the patient that most people either abstain from alcohol or drink within safe limits (<=14/week and <=4 drinks/occasion for  males, <=7/weeks and <= 3 drinks/occasion for females) and that the risk for alcohol disorders and other health effects rises proportionally with the number of drinks per week and how often a drinker exceeds daily limits.  Discussed cessation/primary prevention of drug use and availability of treatment for abuse.   Diet: Encouraged to adjust caloric intake to maintain  or achieve ideal body weight, to reduce intake of dietary saturated fat and total fat, to limit sodium intake by avoiding high sodium foods and not adding table salt, and to maintain adequate dietary potassium and calcium preferably from fresh fruits, vegetables, and low-fat dairy products.    stressed the importance of regular exercise  Injury prevention:  Discussed safety belts, safety helmets, smoke detector, smoking near bedding or upholstery.   Dental health: Discussed importance of regular tooth brushing, flossing, and dental visits.    NEXT PREVENTATIVE PHYSICAL DUE IN 1 YEAR. Return 1-2 months, for follow up.

## 2016-09-22 NOTE — Assessment & Plan Note (Signed)
Has not heard from uro-gyn yet. Referral generated again today.

## 2016-09-22 NOTE — Assessment & Plan Note (Signed)
Still not under great control. Will get her into see GI- await their input.

## 2016-09-22 NOTE — Patient Instructions (Addendum)
Health Maintenance, Female Adopting a healthy lifestyle and getting preventive care can go a long way to promote health and wellness. Talk with your health care provider about what schedule of regular examinations is right for you. This is a good chance for you to check in with your provider about disease prevention and staying healthy. In between checkups, there are plenty of things you can do on your own. Experts have done a lot of research about which lifestyle changes and preventive measures are most likely to keep you healthy. Ask your health care provider for more information. Weight and diet Eat a healthy diet  Be sure to include plenty of vegetables, fruits, low-fat dairy products, and lean protein.  Do not eat a lot of foods high in solid fats, added sugars, or salt.  Get regular exercise. This is one of the most important things you can do for your health.  Most adults should exercise for at least 150 minutes each week. The exercise should increase your heart rate and make you sweat (moderate-intensity exercise).  Most adults should also do strengthening exercises at least twice a week. This is in addition to the moderate-intensity exercise. Maintain a healthy weight  Body mass index (BMI) is a measurement that can be used to identify possible weight problems. It estimates body fat based on height and weight. Your health care provider can help determine your BMI and help you achieve or maintain a healthy weight.  For females 76 years of age and older:  A BMI below 18.5 is considered underweight.  A BMI of 18.5 to 24.9 is normal.  A BMI of 25 to 29.9 is considered overweight.  A BMI of 30 and above is considered obese. Watch levels of cholesterol and blood lipids  You should start having your blood tested for lipids and cholesterol at 54 years of age, then have this test every 5 years.  You may need to have your cholesterol levels checked more often if:  Your lipid or  cholesterol levels are high.  You are older than 54 years of age.  You are at high risk for heart disease. Cancer screening Lung Cancer  Lung cancer screening is recommended for adults 64-42 years old who are at high risk for lung cancer because of a history of smoking.  A yearly low-dose CT scan of the lungs is recommended for people who:  Currently smoke.  Have quit within the past 15 years.  Have at least a 30-pack-year history of smoking. A pack year is smoking an average of one pack of cigarettes a day for 1 year.  Yearly screening should continue until it has been 15 years since you quit.  Yearly screening should stop if you develop a health problem that would prevent you from having lung cancer treatment. Breast Cancer  Practice breast self-awareness. This means understanding how your breasts normally appear and feel.  It also means doing regular breast self-exams. Let your health care provider know about any changes, no matter how small.  If you are in your 20s or 30s, you should have a clinical breast exam (CBE) by a health care provider every 1-3 years as part of a regular health exam.  If you are 34 or older, have a CBE every year. Also consider having a breast X-ray (mammogram) every year.  If you have a family history of breast cancer, talk to your health care provider about genetic screening.  If you are at high risk for breast cancer, talk  to your health care provider about having an MRI and a mammogram every year.  Breast cancer gene (BRCA) assessment is recommended for women who have family members with BRCA-related cancers. BRCA-related cancers include:  Breast.  Ovarian.  Tubal.  Peritoneal cancers.  Results of the assessment will determine the need for genetic counseling and BRCA1 and BRCA2 testing. Cervical Cancer  Your health care provider may recommend that you be screened regularly for cancer of the pelvic organs (ovaries, uterus, and vagina).  This screening involves a pelvic examination, including checking for microscopic changes to the surface of your cervix (Pap test). You may be encouraged to have this screening done every 3 years, beginning at age 24.  For women ages 66-65, health care providers may recommend pelvic exams and Pap testing every 3 years, or they may recommend the Pap and pelvic exam, combined with testing for human papilloma virus (HPV), every 5 years. Some types of HPV increase your risk of cervical cancer. Testing for HPV may also be done on women of any age with unclear Pap test results.  Other health care providers may not recommend any screening for nonpregnant women who are considered low risk for pelvic cancer and who do not have symptoms. Ask your health care provider if a screening pelvic exam is right for you.  If you have had past treatment for cervical cancer or a condition that could lead to cancer, you need Pap tests and screening for cancer for at least 20 years after your treatment. If Pap tests have been discontinued, your risk factors (such as having a new sexual partner) need to be reassessed to determine if screening should resume. Some women have medical problems that increase the chance of getting cervical cancer. In these cases, your health care provider may recommend more frequent screening and Pap tests. Colorectal Cancer  This type of cancer can be detected and often prevented.  Routine colorectal cancer screening usually begins at 54 years of age and continues through 54 years of age.  Your health care provider may recommend screening at an earlier age if you have risk factors for colon cancer.  Your health care provider may also recommend using home test kits to check for hidden blood in the stool.  A small camera at the end of a tube can be used to examine your colon directly (sigmoidoscopy or colonoscopy). This is done to check for the earliest forms of colorectal cancer.  Routine  screening usually begins at age 41.  Direct examination of the colon should be repeated every 5-10 years through 54 years of age. However, you may need to be screened more often if early forms of precancerous polyps or small growths are found. Skin Cancer  Check your skin from head to toe regularly.  Tell your health care provider about any new moles or changes in moles, especially if there is a change in a mole's shape or color.  Also tell your health care provider if you have a mole that is larger than the size of a pencil eraser.  Always use sunscreen. Apply sunscreen liberally and repeatedly throughout the day.  Protect yourself by wearing long sleeves, pants, a wide-brimmed hat, and sunglasses whenever you are outside. Heart disease, diabetes, and high blood pressure  High blood pressure causes heart disease and increases the risk of stroke. High blood pressure is more likely to develop in:  People who have blood pressure in the high end of the normal range (130-139/85-89 mm Hg).  People who are overweight or obese.  People who are African American.  If you are 59-24 years of age, have your blood pressure checked every 3-5 years. If you are 34 years of age or older, have your blood pressure checked every year. You should have your blood pressure measured twice-once when you are at a hospital or clinic, and once when you are not at a hospital or clinic. Record the average of the two measurements. To check your blood pressure when you are not at a hospital or clinic, you can use:  An automated blood pressure machine at a pharmacy.  A home blood pressure monitor.  If you are between 29 years and 60 years old, ask your health care provider if you should take aspirin to prevent strokes.  Have regular diabetes screenings. This involves taking a blood sample to check your fasting blood sugar level.  If you are at a normal weight and have a low risk for diabetes, have this test once  every three years after 54 years of age.  If you are overweight and have a high risk for diabetes, consider being tested at a younger age or more often. Preventing infection Hepatitis B  If you have a higher risk for hepatitis B, you should be screened for this virus. You are considered at high risk for hepatitis B if:  You were born in a country where hepatitis B is common. Ask your health care provider which countries are considered high risk.  Your parents were born in a high-risk country, and you have not been immunized against hepatitis B (hepatitis B vaccine).  You have HIV or AIDS.  You use needles to inject street drugs.  You live with someone who has hepatitis B.  You have had sex with someone who has hepatitis B.  You get hemodialysis treatment.  You take certain medicines for conditions, including cancer, organ transplantation, and autoimmune conditions. Hepatitis C  Blood testing is recommended for:  Everyone born from [birth year] through 1963.  Anyone with known risk factors for hepatitis C. Sexually transmitted infections (STIs)  You should be screened for sexually transmitted infections (STIs) including gonorrhea and chlamydia if:  You are sexually active and are younger than 54 years of age.  You are older than 54 years of age and your health care provider tells you that you are at risk for this type of infection.  Your sexual activity has changed since you were last screened and you are at an increased risk for chlamydia or gonorrhea. Ask your health care provider if you are at risk.  If you do not have HIV, but are at risk, it may be recommended that you take a prescription medicine daily to prevent HIV infection. This is called pre-exposure prophylaxis (PrEP). You are considered at risk if:  You are sexually active and do not regularly use condoms or know the HIV status of your partner(s).  You take drugs by injection.  You are sexually active with a partner  who has HIV. Talk with your health care provider about whether you are at high risk of being infected with HIV. If you choose to begin PrEP, you should first be tested for HIV. You should then be tested every 3 months for as long as you are taking PrEP. Pregnancy  If you are premenopausal and you may become pregnant, ask your health care provider about preconception counseling.  If you may become pregnant, take 400 to 800 micrograms (mcg) of folic acid  every day.  If you want to prevent pregnancy, talk to your health care provider about birth control (contraception). Osteoporosis and menopause  Osteoporosis is a disease in which the bones lose minerals and strength with aging. This can result in serious bone fractures. Your risk for osteoporosis can be identified using a bone density scan.  If you are 63 years of age or older, or if you are at risk for osteoporosis and fractures, ask your health care provider if you should be screened.  Ask your health care provider whether you should take a calcium or vitamin D supplement to lower your risk for osteoporosis.  Menopause may have certain physical symptoms and risks.  Hormone replacement therapy may reduce some of these symptoms and risks. Talk to your health care provider about whether hormone replacement therapy is right for you. Follow these instructions at home:  Schedule regular health, dental, and eye exams.  Stay current with your immunizations.  Do not use any tobacco products including cigarettes, chewing tobacco, or electronic cigarettes.  If you are pregnant, do not drink alcohol.  If you are breastfeeding, limit how much and how often you drink alcohol.  Limit alcohol intake to no more than 1 drink per day for nonpregnant women. One drink equals 12 ounces of beer, 5 ounces of wine, or 1 ounces of hard liquor.  Do not use street drugs.  Do not share needles.  Ask your health care provider for help if you need support  or information about quitting drugs.  Tell your health care provider if you often feel depressed.  Tell your health care provider if you have ever been abused or do not feel safe at home. This information is not intended to replace advice given to you by your health care provider. Make sure you discuss any questions you have with your health care provider. Document Released: 12/08/2010 Document Revised: 10/31/2015 Document Reviewed: 02/26/2015 Elsevier Interactive Patient Education  2017 [place] Maintenance for Postmenopausal Women Menopause is a normal process in which your reproductive ability comes to an end. This process happens gradually over a span of months to years, usually between the ages of 48 and 29. Menopause is complete when you have missed 12 consecutive menstrual periods. It is important to talk with your health care provider about some of the most common conditions that affect postmenopausal women, such as heart disease, cancer, and bone loss (osteoporosis). Adopting a healthy lifestyle and getting preventive care can help to promote your health and wellness. Those actions can also lower your chances of developing some of these common conditions. What should I know about menopause? During menopause, you may experience a number of symptoms, such as:  Moderate-to-severe hot flashes.  Night sweats.  Decrease in sex drive.  Mood swings.  Headaches.  Tiredness.  Irritability.  Memory problems.  Insomnia. Choosing to treat or not to treat menopausal changes is an individual decision that you make with your health care provider. What should I know about hormone replacement therapy and supplements? Hormone therapy products are effective for treating symptoms that are associated with menopause, such as hot flashes and night sweats. Hormone replacement carries certain risks, especially as you become older. If you are thinking about using estrogen or estrogen  with progestin treatments, discuss the benefits and risks with your health care provider. What should I know about heart disease and stroke? Heart disease, heart attack, and stroke become more likely as you age. This may be due, in  part, to the hormonal changes that your body experiences during menopause. These can affect how your body processes dietary fats, triglycerides, and cholesterol. Heart attack and stroke are both medical emergencies. There are many things that you can do to help prevent heart disease and stroke:  Have your blood pressure checked at least every 1-2 years. High blood pressure causes heart disease and increases the risk of stroke.  If you are 54-61 years old, ask your health care provider if you should take aspirin to prevent a heart attack or a stroke.  Do not use any tobacco products, including cigarettes, chewing tobacco, or electronic cigarettes. If you need help quitting, ask your health care provider.  It is important to eat a healthy diet and maintain a healthy weight.  Be sure to include plenty of vegetables, fruits, low-fat dairy products, and lean protein.  Avoid eating foods that are high in solid fats, added sugars, or salt (sodium).  Get regular exercise. This is one of the most important things that you can do for your health.  Try to exercise for at least 150 minutes each week. The type of exercise that you do should increase your heart rate and make you sweat. This is known as moderate-intensity exercise.  Try to do strengthening exercises at least twice each week. Do these in addition to the moderate-intensity exercise.  Know your numbers.Ask your health care provider to check your cholesterol and your blood glucose. Continue to have your blood tested as directed by your health care provider. What should I know about cancer screening? There are several types of cancer. Take the following steps to reduce your risk and to catch any cancer development  as early as possible. Breast Cancer  Practice breast self-awareness.  This means understanding how your breasts normally appear and feel.  It also means doing regular breast self-exams. Let your health care provider know about any changes, no matter how small.  If you are 63 or older, have a clinician do a breast exam (clinical breast exam or CBE) every year. Depending on your age, family history, and medical history, it may be recommended that you also have a yearly breast X-ray (mammogram).  If you have a family history of breast cancer, talk with your health care provider about genetic screening.  If you are at high risk for breast cancer, talk with your health care provider about having an MRI and a mammogram every year.  Breast cancer (BRCA) gene test is recommended for women who have family members with BRCA-related cancers. Results of the assessment will determine the need for genetic counseling and BRCA1 and for BRCA2 testing. BRCA-related cancers include these types:  Breast. This occurs in males or females.  Ovarian.  Tubal. This may also be called fallopian tube cancer.  Cancer of the abdominal or pelvic lining (peritoneal cancer).  Prostate.  Pancreatic. Cervical, Uterine, and Ovarian Cancer  Your health care provider may recommend that you be screened regularly for cancer of the pelvic organs. These include your ovaries, uterus, and vagina. This screening involves a pelvic exam, which includes checking for microscopic changes to the surface of your cervix (Pap test).  For women ages 21-65, health care providers may recommend a pelvic exam and a Pap test every three years. For women ages 61-65, they may recommend the Pap test and pelvic exam, combined with testing for human papilloma virus (HPV), every five years. Some types of HPV increase your risk of cervical cancer. Testing for HPV may  also be done on women of any age who have unclear Pap test results.  Other health  care providers may not recommend any screening for nonpregnant women who are considered low risk for pelvic cancer and have no symptoms. Ask your health care provider if a screening pelvic exam is right for you.  If you have had past treatment for cervical cancer or a condition that could lead to cancer, you need Pap tests and screening for cancer for at least 20 years after your treatment. If Pap tests have been discontinued for you, your risk factors (such as having a new sexual partner) need to be reassessed to determine if you should start having screenings again. Some women have medical problems that increase the chance of getting cervical cancer. In these cases, your health care provider may recommend that you have screening and Pap tests more often.  If you have a family history of uterine cancer or ovarian cancer, talk with your health care provider about genetic screening.  If you have vaginal bleeding after reaching menopause, tell your health care provider.  There are currently no reliable tests available to screen for ovarian cancer. Lung Cancer  Lung cancer screening is recommended for adults 61-47 years old who are at high risk for lung cancer because of a history of smoking. A yearly low-dose CT scan of the lungs is recommended if you:  Currently smoke.  Have a history of at least 30 pack-years of smoking and you currently smoke or have quit within the past 15 years. A pack-year is smoking an average of one pack of cigarettes per day for one year. Yearly screening should:  Continue until it has been 15 years since you quit.  Stop if you develop a health problem that would prevent you from having lung cancer treatment. Colorectal Cancer  This type of cancer can be detected and can often be prevented.  Routine colorectal cancer screening usually begins at age 2 and continues through age 50.  If you have risk factors for colon cancer, your health care provider may recommend  that you be screened at an earlier age.  If you have a family history of colorectal cancer, talk with your health care provider about genetic screening.  Your health care provider may also recommend using home test kits to check for hidden blood in your stool.  A small camera at the end of a tube can be used to examine your colon directly (sigmoidoscopy or colonoscopy). This is done to check for the earliest forms of colorectal cancer.  Direct examination of the colon should be repeated every 5-10 years until age 54. However, if early forms of precancerous polyps or small growths are found or if you have a family history or genetic risk for colorectal cancer, you may need to be screened more often. Skin Cancer  Check your skin from head to toe regularly.  Monitor any moles. Be sure to tell your health care provider:  About any new moles or changes in moles, especially if there is a change in a mole's shape or color.  If you have a mole that is larger than the size of a pencil eraser.  If any of your family members has a history of skin cancer, especially at a young age, talk with your health care provider about genetic screening.  Always use sunscreen. Apply sunscreen liberally and repeatedly throughout the day.  Whenever you are outside, protect yourself by wearing long sleeves, pants, a wide-brimmed hat, and  sunglasses. What should I know about osteoporosis? Osteoporosis is a condition in which bone destruction happens more quickly than new bone creation. After menopause, you may be at an increased risk for osteoporosis. To help prevent osteoporosis or the bone fractures that can happen because of osteoporosis, the following is recommended:  If you are 77-55 years old, get at least 1,000 mg of calcium and at least 600 mg of vitamin D per day.  If you are older than age 10 but younger than age 17, get at least 1,200 mg of calcium and at least 600 mg of vitamin D per day.  If you are  older than age 49, get at least 1,200 mg of calcium and at least 800 mg of vitamin D per day. Smoking and excessive alcohol intake increase the risk of osteoporosis. Eat foods that are rich in calcium and vitamin D, and do weight-bearing exercises several times each week as directed by your health care provider. What should I know about how menopause affects my mental health? Depression may occur at any age, but it is more common as you become older. Common symptoms of depression include:  Low or sad mood.  Changes in sleep patterns.  Changes in appetite or eating patterns.  Feeling an overall lack of motivation or enjoyment of activities that you previously enjoyed.  Frequent crying spells. Talk with your health care provider if you think that you are experiencing depression. What should I know about immunizations? It is important that you get and maintain your immunizations. These include:  Tetanus, diphtheria, and pertussis (Tdap) booster vaccine.  Influenza every year before the flu season begins.  Pneumonia vaccine.  Shingles vaccine. Your health care provider may also recommend other immunizations. This information is not intended to replace advice given to you by your health care provider. Make sure you discuss any questions you have with your health care provider. Document Released: 07/17/2005 Document Revised: 12/13/2015 Document Reviewed: 02/26/2015 Elsevier Interactive Patient Education  2017 [employer].

## 2016-09-23 LAB — COMPREHENSIVE METABOLIC PANEL
ALBUMIN: 4.6 g/dL (ref 3.5–5.5)
ALT: 11 IU/L (ref 0–32)
AST: 14 IU/L (ref 0–40)
Albumin/Globulin Ratio: 2.6 — ABNORMAL HIGH (ref 1.2–2.2)
Alkaline Phosphatase: 73 IU/L (ref 39–117)
BUN / CREAT RATIO: 10 (ref 9–23)
BUN: 9 mg/dL (ref 6–24)
Bilirubin Total: 0.7 mg/dL (ref 0.0–1.2)
CO2: 21 mmol/L (ref 18–29)
CREATININE: 0.86 mg/dL (ref 0.57–1.00)
Calcium: 9.8 mg/dL (ref 8.7–10.2)
Chloride: 104 mmol/L (ref 96–106)
GFR, EST AFRICAN AMERICAN: 89 mL/min/{1.73_m2} (ref >59)
GFR, EST NON AFRICAN AMERICAN: 77 mL/min/{1.73_m2} (ref >59)
GLOBULIN, TOTAL: 1.8 g/dL (ref 1.5–4.5)
GLUCOSE: 95 mg/dL (ref 65–99)
Potassium: 4.6 mmol/L (ref 3.5–5.2)
SODIUM: 142 mmol/L (ref 134–144)
Total Protein: 6.4 g/dL (ref 6.0–8.5)

## 2016-09-23 LAB — LIPID PANEL W/O CHOL/HDL RATIO
CHOLESTEROL TOTAL: 276 mg/dL — AB (ref 100–199)
HDL: 49 mg/dL (ref >39)
LDL CALC: 199 mg/dL — AB (ref 0–99)
Triglycerides: 140 mg/dL (ref 0–149)
VLDL CHOLESTEROL CAL: 28 mg/dL (ref 5–40)

## 2016-09-23 LAB — CBC WITH DIFFERENTIAL/PLATELET
BASOS ABS: 0 10*3/uL (ref 0.0–0.2)
Basos: 0 %
EOS (ABSOLUTE): 0.1 10*3/uL (ref 0.0–0.4)
EOS: 2 %
HEMATOCRIT: 38.9 % (ref 34.0–46.6)
HEMOGLOBIN: 13.3 g/dL (ref 11.1–15.9)
IMMATURE GRANS (ABS): 0 10*3/uL (ref 0.0–0.1)
Immature Granulocytes: 0 %
LYMPHS ABS: 1.5 10*3/uL (ref 0.7–3.1)
Lymphs: 23 %
MCH: 30 pg (ref 26.6–33.0)
MCHC: 34.2 g/dL (ref 31.5–35.7)
MCV: 88 fL (ref 79–97)
MONOCYTES: 6 %
Monocytes Absolute: 0.4 10*3/uL (ref 0.1–0.9)
Neutrophils Absolute: 4.4 10*3/uL (ref 1.4–7.0)
Neutrophils: 69 %
Platelets: 253 10*3/uL (ref 150–379)
RBC: 4.43 x10E6/uL (ref 3.77–5.28)
RDW: 15.3 % (ref 12.3–15.4)
WBC: 6.4 10*3/uL (ref 3.4–10.8)

## 2016-09-23 LAB — UA/M W/RFLX CULTURE, ROUTINE
Bilirubin, UA: NEGATIVE
Glucose, UA: NEGATIVE
KETONES UA: NEGATIVE
LEUKOCYTES UA: NEGATIVE
Nitrite, UA: NEGATIVE
PROTEIN UA: NEGATIVE
RBC UA: NEGATIVE
SPEC GRAV UA: 1.01 (ref 1.005–1.030)
Urobilinogen, Ur: 0.2 mg/dL (ref 0.2–1.0)
pH, UA: 7 (ref 5.0–7.5)

## 2016-09-23 LAB — MICROALBUMIN, URINE WAIVED
CREATININE, URINE WAIVED: 50 mg/dL (ref 10–300)
Microalb, Ur Waived: 10 mg/L (ref 0–19)

## 2016-09-23 LAB — PLEASE NOTE

## 2016-09-23 LAB — MICROSCOPIC EXAMINATION
Bacteria, UA: NONE SEEN
RBC, UA: NONE SEEN /hpf (ref 0–?)
WBC, UA: NONE SEEN /hpf (ref 0–?)

## 2016-09-23 LAB — TSH: TSH: 0.679 u[IU]/mL (ref 0.450–4.500)

## 2016-09-23 LAB — HGB A1C W/O EAG: HEMOGLOBIN A1C: 5.5 % (ref 4.8–5.6)

## 2016-09-24 LAB — CULTURE, GROUP A STREP: Strep A Culture: NEGATIVE

## 2016-09-24 LAB — RAPID STREP SCREEN (MED CTR MEBANE ONLY): Strep Gp A Ag, IA W/Reflex: NEGATIVE

## 2016-09-28 ENCOUNTER — Telehealth: Payer: Self-pay | Admitting: Family Medicine

## 2016-09-28 DIAGNOSIS — E785 Hyperlipidemia, unspecified: Secondary | ICD-10-CM | POA: Insufficient documentation

## 2016-09-28 DIAGNOSIS — E782 Mixed hyperlipidemia: Secondary | ICD-10-CM

## 2016-09-28 MED ORDER — EZETIMIBE 10 MG PO TABS: 10.0000 mg | ORAL_TABLET | Freq: Every day | ORAL | 3 refills | Status: DC

## 2016-09-28 NOTE — Telephone Encounter (Signed)
Called and let patient know about results. Patient would like to know if there are any other medications other than statins.

## 2016-09-28 NOTE — Telephone Encounter (Signed)
Rx sent to her pharmacy 

## 2016-09-28 NOTE — Telephone Encounter (Signed)
Please let her know that everything looks good except her cholesterol is really high! We should probably put her on something to keep her heart healthy- let me know if this is OK and I'll send something through to the pharmacy for her

## 2016-09-28 NOTE — Telephone Encounter (Signed)
We can try zetia if she's willing

## 2016-09-28 NOTE — Telephone Encounter (Signed)
Patient states that she is willing to try zetia.

## 2016-10-08 ENCOUNTER — Other Ambulatory Visit: Payer: Self-pay

## 2016-10-08 ENCOUNTER — Telehealth: Payer: Self-pay

## 2016-10-08 DIAGNOSIS — Z1211 Encounter for screening for malignant neoplasm of colon: Secondary | ICD-10-CM

## 2016-10-08 DIAGNOSIS — K219 Gastro-esophageal reflux disease without esophagitis: Secondary | ICD-10-CM

## 2016-10-08 NOTE — Telephone Encounter (Signed)
Gastroenterology Pre-Procedure Review  Request Date: 5/14 Requesting Physician: Dr. Allen Norris  PATIENT REVIEW QUESTIONS: The patient responded to the following health history questions as indicated:    1. Are you having any GI issues? yes (GERD) 2. Do you have a personal history of Polyps? yes (REMOVED) 3. Do you have a family history of Colon Cancer or Polyps? yes (mother & sister: polyps) 4. Diabetes Mellitus? no 5. Joint replacements in the past 12 months?no 6. Major health problems in the past 3 months?no 7. Any artificial heart valves, MVP, or defibrillator?no    MEDICATIONS & ALLERGIES:    Patient reports the following regarding taking any anticoagulation/antiplatelet therapy:   Plavix, Coumadin, Eliquis, Xarelto, Lovenox, Pradaxa, Brilinta, or Effient? no Aspirin? no  Patient confirms/reports the following medications:  Current Outpatient Prescriptions  Medication Sig Dispense Refill  . albuterol (PROVENTIL HFA;VENTOLIN HFA) 108 (90 BASE) MCG/ACT inhaler Inhale 2 puffs into the lungs every 6 (six) hours as needed for wheezing or shortness of breath.    Marland Kitchen amLODipine (NORVASC) 5 MG tablet Take 1 tablet (5 mg total) by mouth daily. 90 tablet 1  . atenolol (TENORMIN) 25 MG tablet Take 1 tablet (25 mg total) by mouth daily. 90 tablet 1  . bacitracin ophthalmic ointment Use on sutures 4 times a day for 12-14 days 3.5 g 3  . clonazePAM (KLONOPIN) 0.5 MG tablet Take 1 tablet (0.5 mg total) by mouth daily as needed for anxiety. 30 tablet 0  . ezetimibe (ZETIA) 10 MG tablet Take 1 tablet (10 mg total) by mouth daily. 30 tablet 3  . FLUoxetine (PROZAC) 20 MG tablet Take 4 tablets (80 mg total) by mouth daily. 369 tablet 1  . furosemide (LASIX) 20 MG tablet take 1 tablet by mouth daily if needed for SWELLING 30 tablet 3  . hydrochlorothiazide (HYDRODIURIL) 25 MG tablet Take 1 tablet (25 mg total) by mouth daily. 90 tablet 1  . latanoprost (XALATAN) 0.005 % ophthalmic solution Place 1 drop into  both eyes at bedtime.    Marland Kitchen losartan (COZAAR) 100 MG tablet Take 1 tablet (100 mg total) by mouth daily. 90 tablet 1  . nystatin-triamcinolone ointment (MYCOLOG) Apply 1 application topically 2 (two) times daily. 30 g 1  . omeprazole (PRILOSEC) 40 MG capsule Take 1 capsule (40 mg total) by mouth daily. 30 capsule 6  . pregabalin (LYRICA) 100 MG capsule Take 1 capsule (100 mg total) by mouth 3 (three) times daily. 270 capsule 1  . tiZANidine (ZANAFLEX) 4 MG tablet Take 1 tablet (4 mg total) by mouth at bedtime. 30 tablet 2  . traMADol (ULTRAM) 50 MG tablet Take 50 mg by mouth every 6 (six) hours as needed (1-2 every 6 hour prn).    . valACYclovir (VALTREX) 1000 MG tablet Take 1 tablet (1,000 mg total) by mouth daily. 90 tablet 1   No current facility-administered medications for this visit.     Patient confirms/reports the following allergies:  Allergies  Allergen Reactions  . Abilify [Aripiprazole] Swelling  . Ace Inhibitors Cough    So bad she vomited  . Wellbutrin [Bupropion] Other (See Comments)    depression  . Azithromycin Rash  . Erythromycin Rash    No orders of the defined types were placed in this encounter.   AUTHORIZATION INFORMATION Primary Insurance: 1D#: Group #:  Secondary Insurance: 1D#: Group #:  SCHEDULE INFORMATION: Date: 5/14 Time: Location: [place]

## 2016-10-09 ENCOUNTER — Telehealth: Payer: Self-pay | Admitting: Gastroenterology

## 2016-10-09 NOTE — Telephone Encounter (Signed)
10/09/16 Spoke with Clent Ridges at [facility] and NO prior auth required for  Colonoscopy [phone] / EGD 43235 Z12.11 / K21.9.

## 2016-10-13 ENCOUNTER — Encounter: Payer: Self-pay | Admitting: *Deleted

## 2016-10-16 NOTE — Discharge Instructions (Signed)

## 2016-10-19 ENCOUNTER — Ambulatory Visit: Payer: BLUE CROSS/BLUE SHIELD | Admitting: Anesthesiology

## 2016-10-19 ENCOUNTER — Ambulatory Visit
Admission: RE | Admit: 2016-10-19 | Discharge: 2016-10-19 | Disposition: A | Discharge status: Home / Self Care | Payer: BLUE CROSS/BLUE SHIELD | Source: Ambulatory Visit | Attending: Gastroenterology | Admitting: Gastroenterology

## 2016-10-19 ENCOUNTER — Encounter
Admission: RE | Disposition: A | Discharge status: Home / Self Care | Payer: Self-pay | Source: Ambulatory Visit | Attending: Gastroenterology

## 2016-10-19 DIAGNOSIS — B9681 Helicobacter pylori [H. pylori] as the cause of diseases classified elsewhere: Secondary | ICD-10-CM | POA: Insufficient documentation

## 2016-10-19 DIAGNOSIS — I10 Essential (primary) hypertension: Secondary | ICD-10-CM | POA: Diagnosis not present

## 2016-10-19 DIAGNOSIS — K635 Polyp of colon: Secondary | ICD-10-CM | POA: Diagnosis not present

## 2016-10-19 DIAGNOSIS — Z860100 Personal history of colon polyps, unspecified: Secondary | ICD-10-CM

## 2016-10-19 DIAGNOSIS — K219 Gastro-esophageal reflux disease without esophagitis: Secondary | ICD-10-CM | POA: Insufficient documentation

## 2016-10-19 DIAGNOSIS — K2970 Gastritis, unspecified, without bleeding: Secondary | ICD-10-CM | POA: Diagnosis not present

## 2016-10-19 DIAGNOSIS — Z09 Encounter for follow-up examination after completed treatment for conditions other than malignant neoplasm: Secondary | ICD-10-CM | POA: Insufficient documentation

## 2016-10-19 DIAGNOSIS — Z79899 Other long term (current) drug therapy: Secondary | ICD-10-CM | POA: Diagnosis not present

## 2016-10-19 DIAGNOSIS — K2950 Unspecified chronic gastritis without bleeding: Secondary | ICD-10-CM | POA: Diagnosis not present

## 2016-10-19 DIAGNOSIS — D125 Benign neoplasm of sigmoid colon: Secondary | ICD-10-CM

## 2016-10-19 DIAGNOSIS — R1310 Dysphagia, unspecified: Secondary | ICD-10-CM

## 2016-10-19 DIAGNOSIS — F329 Major depressive disorder, single episode, unspecified: Secondary | ICD-10-CM | POA: Diagnosis not present

## 2016-10-19 DIAGNOSIS — J449 Chronic obstructive pulmonary disease, unspecified: Secondary | ICD-10-CM | POA: Insufficient documentation

## 2016-10-19 DIAGNOSIS — G4730 Sleep apnea, unspecified: Secondary | ICD-10-CM | POA: Insufficient documentation

## 2016-10-19 HISTORY — PX: COLONOSCOPY WITH PROPOFOL: SHX5780

## 2016-10-19 HISTORY — PX: ESOPHAGOGASTRODUODENOSCOPY (EGD) WITH PROPOFOL: SHX5813

## 2016-10-19 HISTORY — PX: POLYPECTOMY: SHX5525

## 2016-10-19 SURGERY — ESOPHAGOGASTRODUODENOSCOPY (EGD) WITH PROPOFOL
Anesthesia: Monitor Anesthesia Care | Wound class: Clean Contaminated

## 2016-10-19 MED ORDER — STERILE WATER FOR IRRIGATION IR SOLN
Status: DC | PRN
  Administered 2016-10-19: 10:00:00

## 2016-10-19 MED ORDER — GLYCOPYRROLATE 0.2 MG/ML IJ SOLN
INTRAMUSCULAR | Status: DC | PRN
Start: 2016-10-19 — End: 2016-10-19
  Administered 2016-10-19: 0.1 mg via INTRAVENOUS

## 2016-10-19 MED ORDER — LIDOCAINE HCL (CARDIAC) 20 MG/ML IV SOLN
INTRAVENOUS | Status: DC | PRN
  Administered 2016-10-19: 30 mg via INTRAVENOUS

## 2016-10-19 MED ORDER — LACTATED RINGERS IV SOLN
INTRAVENOUS | Status: DC
  Administered 2016-10-19: 10:00:00 via INTRAVENOUS

## 2016-10-19 MED ORDER — PROPOFOL 10 MG/ML IV BOLUS
INTRAVENOUS | Status: DC | PRN
Start: 2016-10-19 — End: 2016-10-19
  Administered 2016-10-19: 40 mg via INTRAVENOUS
  Administered 2016-10-19: 20 mg via INTRAVENOUS
  Administered 2016-10-19 (×2): 80 mg via INTRAVENOUS
  Administered 2016-10-19 (×2): 20 mg via INTRAVENOUS

## 2016-10-19 SURGICAL SUPPLY — 35 items
BALLN DILATOR 10-12 8 (BALLOONS)
BALLN DILATOR 12-15 8 (BALLOONS)
BALLN DILATOR 15-18 8 (BALLOONS)
BALLN DILATOR CRE 0-12 8 (BALLOONS)
BALLN DILATOR ESOPH 8 10 CRE (MISCELLANEOUS) IMPLANT
BALLOON DILATOR 12-15 8 (BALLOONS) IMPLANT
BALLOON DILATOR 15-18 8 (BALLOONS) IMPLANT
BALLOON DILATOR CRE 0-12 8 (BALLOONS) IMPLANT
BLOCK BITE 60FR ADLT L/F GRN (MISCELLANEOUS) ×3 IMPLANT
CANISTER SUCT 1200ML W/VALVE (MISCELLANEOUS) ×3 IMPLANT
CLIP HMST 235XBRD CATH ROT (MISCELLANEOUS) IMPLANT
CLIP RESOLUTION 360 11X235 (MISCELLANEOUS)
FCP ESCP3.2XJMB 240X2.8X (MISCELLANEOUS) ×2
FORCEPS BIOP RAD 4 LRG CAP 4 (CUTTING FORCEPS) IMPLANT
FORCEPS BIOP RJ4 240 W/NDL (MISCELLANEOUS) ×1
FORCEPS ESCP3.2XJMB 240X2.8X (MISCELLANEOUS) ×2 IMPLANT
GOWN CVR UNV OPN BCK APRN NK (MISCELLANEOUS) ×4 IMPLANT
GOWN ISOL THUMB LOOP REG UNIV (MISCELLANEOUS) ×2
INJECTOR VARIJECT VIN23 (MISCELLANEOUS) IMPLANT
KIT DEFENDO VALVE AND CONN (KITS) IMPLANT
KIT ENDO PROCEDURE OLY (KITS) ×3 IMPLANT
MARKER SPOT ENDO TATTOO 5ML (MISCELLANEOUS) IMPLANT
PAD GROUND ADULT SPLIT (MISCELLANEOUS) IMPLANT
PROBE APC STR FIRE (PROBE) IMPLANT
RETRIEVER NET PLAT FOOD (MISCELLANEOUS) IMPLANT
RETRIEVER NET ROTH 2.5X230 LF (MISCELLANEOUS) IMPLANT
SNARE SHORT THROW 13M SML OVAL (MISCELLANEOUS) IMPLANT
SNARE SHORT THROW 30M LRG OVAL (MISCELLANEOUS) IMPLANT
SNARE SNG USE RND 15MM (INSTRUMENTS) IMPLANT
SPOT EX ENDOSCOPIC TATTOO (MISCELLANEOUS)
SYR INFLATION 60ML (SYRINGE) IMPLANT
TRAP ETRAP POLY (MISCELLANEOUS) IMPLANT
VARIJECT INJECTOR VIN23 (MISCELLANEOUS)
WATER STERILE IRR 250ML POUR (IV SOLUTION) ×3 IMPLANT
WIRE CRE 18-20MM 8CM F G (MISCELLANEOUS) IMPLANT

## 2016-10-19 NOTE — Anesthesia Procedure Notes (Signed)
Procedure Name: MAC Performed by: Lind Guest Pre-anesthesia Checklist: Patient identified, Emergency Drugs available, Suction available, Patient being monitored and Timeout performed Patient Re-evaluated:Patient Re-evaluated prior to inductionOxygen Delivery Method: Nasal cannula

## 2016-10-19 NOTE — Op Note (Signed)
[facility] Patient Name: Hannah Kaiser Procedure Date: 10/19/2016 9:52 AM MRN: [id number] Account #: [id number] Date of Birth: 1960/08/20 Admit Type: Outpatient Age: 56 Room: [facility] OR ROOM 01 Gender: Female Note Status: Finalized Procedure:            Colonoscopy Indications:          High risk colon cancer surveillance: Personal history                        of colonic polyps Providers:            Lucilla Lame MD, MD Referring MD:         Valerie Roys (Referring MD) Medicines:            Propofol per Anesthesia Complications:        No immediate complications. Procedure:            Pre-Anesthesia Assessment:                       - Prior to the procedure, a History and Physical was                        performed, and patient medications and allergies were                        reviewed. The patient's tolerance of previous                        anesthesia was also reviewed. The risks and benefits of                        the procedure and the sedation options and risks were                        discussed with the patient. All questions were                        answered, and informed consent was obtained. Prior                        Anticoagulants: The patient has taken no previous                        anticoagulant or antiplatelet agents. ASA Grade                        Assessment: II - A patient with mild systemic disease.                        After reviewing the risks and benefits, the patient was                        deemed in satisfactory condition to undergo the                        procedure.                       After obtaining informed consent, the colonoscope was  passed under direct vision. Throughout the procedure,                        the patient's blood pressure, pulse, and oxygen                        saturations were monitored continuously. The [place] [id number]) was introduced through the                        anus and advanced to the the cecum, identified by                        appendiceal orifice and ileocecal valve. The                        colonoscopy was performed without difficulty. The                        patient tolerated the procedure well. The quality of                        the bowel preparation was excellent. Findings:      The perianal and digital rectal examinations were normal.      Two sessile polyps were found in the sigmoid colon. The polyps were 2 to       3 mm in size. These polyps were removed with a cold biopsy forceps.       Resection and retrieval were complete.      Non-bleeding internal hemorrhoids were found during retroflexion. The       hemorrhoids were Grade I (internal hemorrhoids that do not prolapse). Impression:           - Two 2 to 3 mm polyps in the sigmoid colon, removed                        with a cold biopsy forceps. Resected and retrieved.                       - Non-bleeding internal hemorrhoids. Recommendation:       - Discharge patient to home.                       - Resume previous diet.                       - Continue present medications.                       - Repeat colonoscopy in 5 years for surveillance. Procedure Code(s):    --- Professional ---                       [id number], Colonoscopy, flexible; with biopsy, single or                        multiple Diagnosis Code(s):    --- Professional ---  Z86.010, Personal history of colonic polyps                       D12.5, Benign neoplasm of sigmoid colon CPT copyright 2016 American Medical Association. All rights reserved. The codes documented in this report are preliminary and upon coder review may  be revised to meet current compliance requirements. Lucilla Lame MD, MD 10/19/2016 10:14:44 AM This report has been signed electronically. Number of Addenda: 0 Note Initiated On: 10/19/2016 9:52  AM Scope Withdrawal Time: 0 hours 7 minutes 56 seconds  Total Procedure Duration: 0 hours 9 minutes 28 seconds       [facility]

## 2016-10-19 NOTE — H&P (Signed)
Lucilla Lame, MD [address] [place], [place] [address] Phone:[phone] Fax : [phone]  Primary Care Physician:  Valerie Roys, DO Primary Gastroenterologist:  Dr. Allen Norris  Pre-Procedure History & Physical: HPI:  Kandi Brusseau Haley is a 56 y.o. female is here for an endoscopy and colonoscopy.   Past Medical History:  Diagnosis Date  . Arthritis    knees  . Breast pain   . Colon polyps   . COPD (chronic obstructive pulmonary disease) ([place])   . Depression   . GERD (gastroesophageal reflux disease)   . Heart murmur   . History of blood transfusion   . Hypertension   . Motion sickness    cars  . Multilevel degenerative disc disease   . Nipple discharge   . Numbness of both lower extremities    bulging lumbar discs - per pt  . Numbness of upper extremity    bilateral - degenerative cervical discs - per pt  . PONV (postoperative nausea and vomiting)   . Sleep apnea    has not received CPAP machine yet  . Smokers' cough ([place])   . Wears dentures    full upper and lower    Past Surgical History:  Procedure Laterality Date  . ABDOMINAL HYSTERECTOMY  2014  . BROW LIFT Bilateral 03/24/2016   Procedure: BLEPHAROPLASTY;  Surgeon: Karle Starch, MD;  Location: [place];  Service: Ophthalmology;  Laterality: Bilateral;  sleep apnea  . ECTOPIC PREGNANCY SURGERY     x2  . NECK SURGERY  2003  . [place]    Prior to Admission medications   Medication Sig Start Date End Date Taking? Authorizing Provider  albuterol (PROVENTIL HFA;VENTOLIN HFA) 108 (90 BASE) MCG/ACT inhaler Inhale 2 puffs into the lungs every 6 (six) hours as needed for wheezing or shortness of breath.   Yes [provider]  amLODipine (NORVASC) 5 MG tablet Take 1 tablet (5 mg total) by mouth daily. 09/22/16  Yes Johnson, Megan P, DO  atenolol (TENORMIN) 25 MG tablet Take 1 tablet (25 mg total) by mouth daily. 09/22/16  Yes Johnson, Megan P, DO  clonazePAM  (KLONOPIN) 0.5 MG tablet Take 1 tablet (0.5 mg total) by mouth daily as needed for anxiety. 09/22/16  Yes Johnson, Megan P, DO  ezetimibe (ZETIA) 10 MG tablet Take 1 tablet (10 mg total) by mouth daily. 09/28/16  Yes Johnson, Megan P, DO  FLUoxetine (PROZAC) 20 MG tablet Take 4 tablets (80 mg total) by mouth daily. 09/22/16  Yes Johnson, Megan P, DO  furosemide (LASIX) 20 MG tablet take 1 tablet by mouth daily if needed for SWELLING 09/22/16  Yes Johnson, Megan P, DO  latanoprost (XALATAN) 0.005 % ophthalmic solution Place 1 drop into both eyes at bedtime.   Yes [provider]  losartan (COZAAR) 100 MG tablet Take 1 tablet (100 mg total) by mouth daily. 09/22/16  Yes Johnson, Megan P, DO  nystatin-triamcinolone ointment (MYCOLOG) Apply 1 application topically 2 (two) times daily. 11/25/15  Yes Johnson, Megan P, DO  omeprazole (PRILOSEC) 40 MG capsule Take 1 capsule (40 mg total) by mouth daily. 09/22/16  Yes Johnson, Megan P, DO  pregabalin (LYRICA) 100 MG capsule Take 1 capsule (100 mg total) by mouth 3 (three) times daily. 09/22/16  Yes Johnson, Megan P, DO  tiZANidine (ZANAFLEX) 4 MG tablet Take 1 tablet (4 mg total) by mouth at bedtime. 09/22/16  Yes Johnson, Megan P, DO  valACYclovir (VALTREX) 1000  MG tablet Take 1 tablet (1,000 mg total) by mouth daily. 09/22/16  Yes Johnson, Megan P, DO  DULoxetine (CYMBALTA) 30 MG capsule Take by mouth. 03/16/16   [provider]  hydrochlorothiazide (HYDRODIURIL) 25 MG tablet Take 1 tablet (25 mg total) by mouth daily. Patient not taking: Reported on 10/19/2016 09/22/16   Park Liter P, DO  traMADol (ULTRAM) 50 MG tablet Take 50 mg by mouth every 6 (six) hours as needed (1-2 every 6 hour prn).    [provider]    Allergies as of 10/08/2016 - Review Complete 09/22/2016  Allergen Reaction Noted  . Abilify [aripiprazole] Swelling 03/27/2016  . Ace inhibitors Cough 03/13/2015  . Wellbutrin [bupropion] Other (See Comments) 12/03/2015  .  Azithromycin Rash 03/13/2015  . Erythromycin Rash 08/21/2013    Family History  Problem Relation Age of Onset  . Cancer Brother        melanoma  . Cancer Maternal Grandfather        prostate  . Cancer Maternal Uncle        lung    Social History   Social History  . Marital status: Legally Separated    Spouse name: N/A  . Number of children: N/A  . Years of education: N/A   Occupational History  . Not on file.   Social History Main Topics  . Smoking status: Current Every Day Smoker    Packs/day: 1.50    Years: 41.00  . Smokeless tobacco: Never Used     Comment: since age 12  . Alcohol use No  . Drug use: No  . Sexual activity: Not on file   Other Topics Concern  . Not on file   Social History Narrative  . No narrative on file    Review of Systems: See HPI, otherwise negative ROS  Physical Exam: BP (!) 149/93   Pulse (!) 58   Temp 97.9 F (36.6 C) (Temporal)   Resp 16   Ht 5\' 3"  (1.6 m)   Wt 209 lb (94.8 kg)   SpO2 97%   BMI 37.02 kg/m  General:   Alert,  pleasant and cooperative in NAD Head:  Normocephalic and atraumatic. Neck:  Supple; no masses or thyromegaly. Lungs:  Clear throughout to auscultation.    Heart:  Regular rate and rhythm. Abdomen:  Soft, nontender and nondistended. Normal bowel sounds, without guarding, and without rebound.   Neurologic:  Alert and  oriented x4;  grossly normal neurologically.  Impression/Plan: Georgian H Leeb is here for an endoscopy and colonoscopy to be performed for Dyspagiaa nd histroy of polyps  Risks, benefits, limitations, and alternatives regarding  endoscopy and colonoscopy have been reviewed with the patient.  Questions have been answered.  All parties agreeable.   Lucilla Lame, MD  10/19/2016, 9:13 AM

## 2016-10-19 NOTE — Op Note (Signed)
[facility] Patient Name: Hannah Kaiser Procedure Date: 10/19/2016 9:45 AM MRN: [id number] Account #: [id number] Date of Birth: May 04, 1961 Admit Type: Outpatient Age: 56 Room: [facility] OR ROOM 01 Gender: Female Note Status: Finalized Procedure:            Upper GI endoscopy Indications:          Dysphagia Providers:            Lucilla Lame MD, MD Referring MD:         Valerie Roys (Referring MD) Medicines:            Propofol per Anesthesia Complications:        No immediate complications. Procedure:            Pre-Anesthesia Assessment:                       - Prior to the procedure, a History and Physical was                        performed, and patient medications and allergies were                        reviewed. The patient's tolerance of previous                        anesthesia was also reviewed. The risks and benefits of                        the procedure and the sedation options and risks were                        discussed with the patient. All questions were                        answered, and informed consent was obtained. Prior                        Anticoagulants: The patient has taken no previous                        anticoagulant or antiplatelet agents. ASA Grade                        Assessment: II - A patient with mild systemic disease.                        After reviewing the risks and benefits, the patient was                        deemed in satisfactory condition to undergo the                        procedure.                       After obtaining informed consent, the endoscope was                        passed under direct vision. Throughout the procedure,  the patient's blood pressure, pulse, and oxygen                        saturations were monitored continuously. The Olympus                        190 Endoscope [id number]) was introduced through the                        mouth, and  advanced to the second part of duodenum. The                        upper GI endoscopy was accomplished without difficulty.                        The patient tolerated the procedure well. Findings:      The Z-line was irregular and was found at the gastroesophageal junction.      Localized mild inflammation characterized by erythema was found in the       gastric antrum. Biopsies were taken with a cold forceps for histology.      The examined duodenum was normal. Impression:           - Z-line irregular, at the gastroesophageal junction.                       - Gastritis. Biopsied.                       - Normal examined duodenum. Recommendation:       - Await pathology results.                       - Discharge patient to home.                       - Resume previous diet.                       - Continue present medications.                       - Perform a colonoscopy today. Procedure Code(s):    --- Professional ---                       [phone], Esophagogastroduodenoscopy, flexible, transoral;                        with biopsy, single or multiple Diagnosis Code(s):    --- Professional ---                       R13.10, Dysphagia, unspecified                       K29.70, Gastritis, unspecified, without bleeding CPT copyright 2016 American Medical Association. All rights reserved. The codes documented in this report are preliminary and upon coder review may  be revised to meet current compliance requirements. Lucilla Lame MD, MD 10/19/2016 10:00:42 AM This report has been signed electronically. Number of Addenda: 0 Note Initiated On: 10/19/2016 9:45 AM Total Procedure Duration: 0 hours 2 minutes 10 seconds       [facility]

## 2016-10-19 NOTE — Anesthesia Preprocedure Evaluation (Signed)
Anesthesia Evaluation  Patient identified by MRN, date of birth, ID band Patient awake    Reviewed: Allergy & Precautions, NPO status , Patient's Chart, lab work & pertinent test results  History of Anesthesia Complications (+) PONV and history of anesthetic complications  Airway Mallampati: II  TM Distance: >3 FB Neck ROM: Full    Dental no notable dental hx.    Pulmonary Current Smoker,  No home O2 required. Mets 4+. Pt used inhalers today.   Pulmonary exam normal  + wheezing      Cardiovascular hypertension, negative cardio ROS Normal cardiovascular exam Rhythm:Regular Rate:Normal     Neuro/Psych negative neurological ROS  negative psych ROS   GI/Hepatic negative GI ROS, Neg liver ROS, GERD  Controlled,  Endo/Other  negative endocrine ROS  Renal/GU negative Renal ROS  negative genitourinary   Musculoskeletal negative musculoskeletal ROS (+)   Abdominal   Peds negative pediatric ROS (+)  Hematology negative hematology ROS (+)   Anesthesia Other Findings   Reproductive/Obstetrics negative OB ROS                             Anesthesia Physical Anesthesia Plan  ASA: II  Anesthesia Plan: MAC   Post-op Pain Management:    Induction: Intravenous  Airway Management Planned:   Additional Equipment:   Intra-op Plan:   Post-operative Plan: Extubation in OR  Informed Consent: I have reviewed the patients History and Physical, chart, labs and discussed the procedure including the risks, benefits and alternatives for the proposed anesthesia with the patient or authorized representative who has indicated his/her understanding and acceptance.   Dental advisory given  Plan Discussed with: CRNA  Anesthesia Plan Comments:         Anesthesia Quick Evaluation

## 2016-10-19 NOTE — Transfer of Care (Signed)
Immediate Anesthesia Transfer of Care Note  Patient: Hannah Kaiser  Procedure(s) Performed: Procedure(s): ESOPHAGOGASTRODUODENOSCOPY (EGD) WITH PROPOFOL (N/A) COLONOSCOPY WITH PROPOFOL (N/A) POLYPECTOMY  Patient Location: PACU  Anesthesia Type: MAC  Level of Consciousness: awake, alert  and patient cooperative  Airway and Oxygen Therapy: Patient Spontanous Breathing and Patient connected to supplemental oxygen  Post-op Assessment: Post-op Vital signs reviewed, Patient's Cardiovascular Status Stable, Respiratory Function Stable, Patent Airway and No signs of Nausea or vomiting  Post-op Vital Signs: Reviewed and stable  Complications: No apparent anesthesia complications

## 2016-10-19 NOTE — Anesthesia Postprocedure Evaluation (Signed)
Anesthesia Post Note  Patient: Hannah Kaiser  Procedure(s) Performed: Procedure(s) (LRB): ESOPHAGOGASTRODUODENOSCOPY (EGD) WITH PROPOFOL (N/A) COLONOSCOPY WITH PROPOFOL (N/A) POLYPECTOMY  Patient location during evaluation: PACU Anesthesia Type: MAC Level of consciousness: awake and alert Pain management: pain level controlled Vital Signs Assessment: post-procedure vital signs reviewed and stable Respiratory status: spontaneous breathing, nonlabored ventilation, respiratory function stable and patient connected to nasal cannula oxygen Cardiovascular status: stable and blood pressure returned to baseline Anesthetic complications: no    Jaymason Ledesma C

## 2016-10-20 ENCOUNTER — Ambulatory Visit (INDEPENDENT_AMBULATORY_CARE_PROVIDER_SITE_OTHER): Payer: BLUE CROSS/BLUE SHIELD | Admitting: Urology

## 2016-10-20 ENCOUNTER — Encounter: Payer: Self-pay | Admitting: Gastroenterology

## 2016-10-20 VITALS — BP 163/95 | HR 62 | Ht 63.0 in | Wt 212.0 lb

## 2016-10-20 DIAGNOSIS — R350 Frequency of micturition: Secondary | ICD-10-CM

## 2016-10-20 LAB — URINALYSIS, COMPLETE
Bilirubin, UA: NEGATIVE
GLUCOSE, UA: NEGATIVE
LEUKOCYTES UA: NEGATIVE
Nitrite, UA: NEGATIVE
PROTEIN UA: NEGATIVE
Specific Gravity, UA: 1.02 (ref 1.005–1.030)
Urobilinogen, Ur: 0.2 mg/dL (ref 0.2–1.0)
pH, UA: 5.5 (ref 5.0–7.5)

## 2016-10-20 LAB — MICROSCOPIC EXAMINATION: WBC, UA: NONE SEEN /hpf (ref 0–?)

## 2016-10-20 LAB — BLADDER SCAN AMB NON-IMAGING

## 2016-10-20 NOTE — Progress Notes (Signed)
10/20/2016 10:46 AM   Hannah Kaiser 13-Mar-1961 [id number]  Referring provider: Valerie Roys, DO [place],  [id number]  Chief Complaint  Patient presents with  . Bladder Prolapse    HPI: Referral for LUTS - Patient complain of urinary frequency, urgency, nocturia and intermittent stream. She denies weak stream and straining to void. She noticed these symptoms for many years but worse since 2014. She tried some "medicines" in the past to strengthen the muscles. She has some mild leak with cough/sneeze but also with urgency. She doesn't need pads daily. She is on duloxetine. NG includes lower back and neck surgery x 2. She feels bulge/pressure per vagina but doesn't have to reduce anything. She also bowel urgency. No constipation. She underwent TAHx in 2014.   UA clear Post void residual - 0 ml  PMH: Past Medical History:  Diagnosis Date  . Arthritis    knees  . Breast pain   . Colon polyps   . COPD (chronic obstructive pulmonary disease) ([place])   . Depression   . GERD (gastroesophageal reflux disease)   . Heart murmur   . History of blood transfusion   . Hypertension   . Motion sickness    cars  . Multilevel degenerative disc disease   . Nipple discharge   . Numbness of both lower extremities    bulging lumbar discs - per pt  . Numbness of upper extremity    bilateral - degenerative cervical discs - per pt  . PONV (postoperative nausea and vomiting)   . Sleep apnea    has not received CPAP machine yet  . Smokers' cough ([place])   . Wears dentures    full upper and lower    Surgical History: Past Surgical History:  Procedure Laterality Date  . ABDOMINAL HYSTERECTOMY  2014  . BROW LIFT Bilateral 03/24/2016   Procedure: BLEPHAROPLASTY;  Surgeon: Karle Starch, MD;  Location: [facility];  Service: Ophthalmology;  Laterality: Bilateral;  sleep apnea  . COLONOSCOPY WITH PROPOFOL N/A 10/19/2016   Procedure: COLONOSCOPY WITH PROPOFOL;  Surgeon: Lucilla Lame, MD;  Location: [facility];  Service: Endoscopy;  Laterality: N/A;  . ECTOPIC PREGNANCY SURGERY     x2  . ESOPHAGOGASTRODUODENOSCOPY (EGD) WITH PROPOFOL N/A 10/19/2016   Procedure: ESOPHAGOGASTRODUODENOSCOPY (EGD) WITH PROPOFOL;  Surgeon: Lucilla Lame, MD;  Location: [facility];  Service: Endoscopy;  Laterality: N/A;  . NECK SURGERY  2003  . POLYPECTOMY  10/19/2016   Procedure: POLYPECTOMY;  Surgeon: Lucilla Lame, MD;  Location: [facility];  Service: Endoscopy;;  . [place] Medications:  Allergies as of 10/20/2016      Reactions   Abilify [aripiprazole] Swelling   Ace Inhibitors Cough   So bad she vomited   Wellbutrin [bupropion] Other (See Comments)   depression   Azithromycin Rash   Erythromycin Rash      Medication List       Accurate as of 10/20/16 10:46 AM. Always use your most recent med list.          albuterol 108 (90 Base) MCG/ACT inhaler Commonly known as:  PROVENTIL HFA;VENTOLIN HFA Inhale 2 puffs into the lungs every 6 (six) hours as needed for wheezing or shortness of breath.   amLODipine 5 MG tablet Commonly known as:  NORVASC Take 1 tablet (5 mg total) by mouth daily.   atenolol 25 MG tablet Commonly known as:  TENORMIN Take 1  tablet (25 mg total) by mouth daily.   clonazePAM 0.5 MG tablet Commonly known as:  KLONOPIN Take 1 tablet (0.5 mg total) by mouth daily as needed for anxiety.   DULoxetine 30 MG capsule Commonly known as:  CYMBALTA Take by mouth.   ezetimibe 10 MG tablet Commonly known as:  ZETIA Take 1 tablet (10 mg total) by mouth daily.   FLUoxetine 20 MG tablet Commonly known as:  PROZAC Take 4 tablets (80 mg total) by mouth daily.   furosemide 20 MG tablet Commonly known as:  LASIX take 1 tablet by mouth daily if needed for SWELLING   hydrochlorothiazide 25 MG tablet Commonly known as:  HYDRODIURIL Take 1 tablet (25 mg total) by mouth daily.   latanoprost 0.005 %  ophthalmic solution Commonly known as:  XALATAN Place 1 drop into both eyes at bedtime.   losartan 100 MG tablet Commonly known as:  COZAAR Take 1 tablet (100 mg total) by mouth daily.   nystatin-triamcinolone ointment Commonly known as:  MYCOLOG Apply 1 application topically 2 (two) times daily.   omeprazole 40 MG capsule Commonly known as:  PRILOSEC Take 1 capsule (40 mg total) by mouth daily.   pregabalin 100 MG capsule Commonly known as:  LYRICA Take 1 capsule (100 mg total) by mouth 3 (three) times daily.   tiZANidine 4 MG tablet Commonly known as:  ZANAFLEX Take 1 tablet (4 mg total) by mouth at bedtime.   traMADol 50 MG tablet Commonly known as:  ULTRAM Take 50 mg by mouth every 6 (six) hours as needed (1-2 every 6 hour prn).   valACYclovir 1000 MG tablet Commonly known as:  VALTREX Take 1 tablet (1,000 mg total) by mouth daily.       Allergies:  Allergies  Allergen Reactions  . Abilify [Aripiprazole] Swelling  . Ace Inhibitors Cough    So bad she vomited  . Wellbutrin [Bupropion] Other (See Comments)    depression  . Azithromycin Rash  . Erythromycin Rash    Family History: Family History  Problem Relation Age of Onset  . Cancer Brother        melanoma  . Cancer Maternal Grandfather        prostate  . Cancer Maternal Uncle        lung    Social History:  reports that she has been smoking.  She has a 61.50 pack-year smoking history. She has never used smokeless tobacco. She reports that she does not drink alcohol or use drugs.  ROS: UROLOGY Frequent Urination?: Yes Hard to postpone urination?: Yes Burning/pain with urination?: No Get up at night to urinate?: Yes Leakage of urine?: Yes Urine stream starts and stops?: Yes Trouble starting stream?: No Do you have to strain to urinate?: No Blood in urine?: Yes Urinary tract infection?: No Sexually transmitted disease?: No Injury to kidneys or bladder?: No Painful intercourse?: No Weak  stream?: No Currently pregnant?: No Vaginal bleeding?: No Last menstrual period?: n  Gastrointestinal Nausea?: Yes Vomiting?: No Indigestion/heartburn?: Yes Diarrhea?: No Constipation?: No  Constitutional Fever: No Night sweats?: Yes Weight loss?: Yes Fatigue?: Yes  Skin Skin rash/lesions?: No Itching?: Yes  Eyes Blurred vision?: Yes Double vision?: No  Ears/Nose/Throat Sore throat?: Yes Sinus problems?: Yes  Hematologic/Lymphatic Swollen glands?: No Easy bruising?: Yes  Cardiovascular Leg swelling?: Yes Chest pain?: No  Respiratory Cough?: Yes Shortness of breath?: Yes  Endocrine Excessive thirst?: Yes  Musculoskeletal Back pain?: Yes Joint pain?: Yes  Neurological Headaches?: Yes Dizziness?: Yes  Psychologic Depression?: Yes Anxiety?: Yes  Physical Exam: Chaperone: Carrie  BP (!) 163/95 (BP Location: Left Arm, Patient Position: Sitting, Cuff Size: Large)   Pulse 62   Ht 5\' 3"  (1.6 m)   Wt 96.2 kg (212 lb)   BMI 37.55 kg/m   Constitutional:  Alert and oriented, No acute distress. HEENT: [place] AT, moist mucus membranes.  Trachea midline, no masses. Cardiovascular: No clubbing, cyanosis, or edema. Respiratory: Normal respiratory effort, no increased work of breathing. GI: Abdomen is soft, nontender, nondistended, no abdominal masses GU: No CVA tenderness. Pelvic - mild atrophy, no lesions, nl meatus. No prolapse. Good apical support. Some urethral moboility. No SUI. Bladder and urethra palpably normal.  Skin: No rashes, bruises or suspicious lesions. Lymph: No cervical or inguinal adenopathy. Neurologic: Grossly intact, no focal deficits, moving all 4 extremities. Psychiatric: Normal mood and affect.  Laboratory Data: Lab Results  Component Value Date   WBC 6.4 09/22/2016   HGB 13.2 06/19/2015   HCT 38.9 09/22/2016   MCV 88 09/22/2016   PLT 253 09/22/2016    Lab Results  Component Value Date   CREATININE 0.86 09/22/2016    No  results found for: PSA  No results found for: TESTOSTERONE  Lab Results  Component Value Date   HGBA1C 5.5 09/22/2016    Urinalysis    Component Value Date/Time   COLORURINE YELLOW (A) 06/19/2015 1424   APPEARANCEUR Clear 09/22/2016 0000   LABSPEC 1.010 06/19/2015 1424   LABSPEC 1.013 08/31/2013 0100   PHURINE 8.0 06/19/2015 1424   GLUCOSEU Negative 09/22/2016 0000   GLUCOSEU Negative 08/31/2013 0100   HGBUR NEGATIVE 06/19/2015 1424   BILIRUBINUR Negative 09/22/2016 0000   BILIRUBINUR Negative 08/31/2013 0100   KETONESUR NEGATIVE 06/19/2015 1424   PROTEINUR Negative 09/22/2016 0000   PROTEINUR NEGATIVE 06/19/2015 1424   NITRITE Negative 09/22/2016 0000   NITRITE NEGATIVE 06/19/2015 1424   LEUKOCYTESUR Negative 09/22/2016 0000   LEUKOCYTESUR Negative 08/31/2013 0100    Assessment & Plan:   1. Urinary frequency Nl exam, no prolapse noted. UA and PVR nl. Discussed some options - surveillance, PT, OAB meds. She was given samples of Toviaz and Vesicare - we discussed nature, r/b of anticholinergics. See in 3 mo to assess symptoms.  - Urinalysis, Complete   No Follow-up on file.  Festus Aloe, [facility] [address] [place], [place] [address] [phone]

## 2016-10-22 ENCOUNTER — Encounter: Payer: Self-pay | Admitting: Gastroenterology

## 2016-11-13 ENCOUNTER — Other Ambulatory Visit: Payer: Self-pay

## 2016-11-23 ENCOUNTER — Encounter: Payer: Self-pay | Admitting: Family Medicine

## 2016-11-23 ENCOUNTER — Ambulatory Visit (INDEPENDENT_AMBULATORY_CARE_PROVIDER_SITE_OTHER): Payer: BLUE CROSS/BLUE SHIELD | Admitting: Family Medicine

## 2016-11-23 ENCOUNTER — Ambulatory Visit
Admission: RE | Admit: 2016-11-23 | Discharge: 2016-11-23 | Disposition: A | Discharge status: Home / Self Care | Payer: BLUE CROSS/BLUE SHIELD | Source: Ambulatory Visit | Attending: Family Medicine | Admitting: Family Medicine

## 2016-11-23 VITALS — BP 137/91 | HR 64 | Temp 98.7°F | Wt 218.0 lb

## 2016-11-23 DIAGNOSIS — M7122 Synovial cyst of popliteal space [Baker], left knee: Secondary | ICD-10-CM

## 2016-11-23 DIAGNOSIS — Z9889 Other specified postprocedural states: Secondary | ICD-10-CM

## 2016-11-23 DIAGNOSIS — R202 Paresthesia of skin: Secondary | ICD-10-CM

## 2016-11-23 MED ORDER — TIZANIDINE HCL 4 MG PO TABS: 4.0000 mg | ORAL_TABLET | Freq: Every day | ORAL | 2 refills | Status: DC

## 2016-11-23 MED ORDER — TRAMADOL HCL 50 MG PO TABS: 50.0000 mg | ORAL_TABLET | Freq: Four times a day (QID) | ORAL | 0 refills | Status: DC | PRN

## 2016-11-23 MED ORDER — HYDROXYZINE HCL 25 MG PO TABS: 25.0000 mg | ORAL_TABLET | Freq: Three times a day (TID) | ORAL | 1 refills | Status: DC | PRN

## 2016-11-23 NOTE — Progress Notes (Signed)
BP (!) 137/91   Pulse 64   Temp 98.7 F (37.1 C)   Wt 218 lb (98.9 kg)   SpO2 98%   BMI 38.62 kg/m    Subjective:    Patient ID: Hannah Kaiser, female    DOB: February 20, 1961, 56 y.o.   MRN: [id number]  HPI: Hannah Kaiser is a 56 y.o. female  Chief Complaint  Patient presents with  . Knee Pain  . Numbness  . RX Refill    Hydroxyzine and a muscle relaxer   KNEE PAIN Duration: chronic Involved knee: left Mechanism of injury: trauma Location:posterior Onset: gradual Severity: severe  Quality:  Sharp pain Frequency: constant Radiation: yes Aggravating factors: weight bearing, walking, running, stairs, bending and movement  Alleviating factors: nothing  Status: worse Treatments attempted: rest, ice, heat, APAP, ibuprofen and aleve  Relief with NSAIDs?:  mild Weakness with weight bearing or walking: yes Sensation of giving way: yes Locking: yes Popping: yes Bruising: no Swelling: yes Redness: no Paresthesias/decreased sensation: no Fevers: no  NUMBNESS- just had the neck surgery about a year ago, saw Dr. Trenton Gammon for that.  Duration: Has happened 4x, 2x yesterday, Started Friday night Onset: sudden Location: breasts and down her arms bilaterally from her collar bone down to below her breasts Bilateral: yes Symmetric: yes Decreased sensation: yes  Weakness: yes Pain: yes Quality:  Numb and tingly Severity: severe  Frequency: 4x in the last 4 days Trauma: no Recent illness: no Diabetes: no Thyroid disease: no  HIV: no  Alcoholism: no  Spinal cord injury: no  Has been stumbling a bit more  Relevant past medical, surgical, family and social history reviewed and updated as indicated. Interim medical history since our last visit reviewed. Allergies and medications reviewed and updated.  Review of Systems  Constitutional: Negative for activity change, appetite change, chills, diaphoresis, fatigue, fever and unexpected weight change.  Respiratory: Negative.     Cardiovascular: Negative.   Musculoskeletal: Positive for arthralgias, gait problem, joint swelling, neck pain and neck stiffness. Negative for back pain and myalgias.  Neurological: Positive for weakness and numbness. Negative for dizziness, tremors, seizures, syncope, facial asymmetry, speech difficulty, light-headedness and headaches.  Psychiatric/Behavioral: Negative.     Per HPI unless specifically indicated above     Objective:    BP (!) 137/91   Pulse 64   Temp 98.7 F (37.1 C)   Wt 218 lb (98.9 kg)   SpO2 98%   BMI 38.62 kg/m   Wt Readings from Last 3 Encounters:  11/23/16 218 lb (98.9 kg)  10/20/16 212 lb (96.2 kg)  10/19/16 209 lb (94.8 kg)    Physical Exam  Constitutional: She is oriented to person, place, and time. She appears well-developed and well-nourished. No distress.  HENT:  Head: Normocephalic and atraumatic.  Right Ear: Hearing normal.  Left Ear: Hearing normal.  Nose: Nose normal.  Eyes: Conjunctivae and lids are normal. Right eye exhibits no discharge. Left eye exhibits no discharge. No scleral icterus.  Cardiovascular: Normal rate, regular rhythm, normal heart sounds and intact distal pulses.  Exam reveals no gallop and no friction rub.   No murmur heard. Pulmonary/Chest: Effort normal and breath sounds normal. No respiratory distress. She has no wheezes. She has no rales. She exhibits no tenderness.  Musculoskeletal: She exhibits edema and tenderness.  Baker's cyst behind L knee  Neurological: She is alert and oriented to person, place, and time.  Skin: Skin is warm, dry and intact. No rash noted. She  is not diaphoretic. No erythema. No pallor.  Psychiatric: She has a normal mood and affect. Her speech is normal and behavior is normal. Judgment and thought content normal. Cognition and memory are normal.  Nursing note and vitals reviewed. Neck Exam:    Tenderness to Palpation: yes    Midline cervical spine: yes    Paraspinal neck musculature:  no    Trapezius: no    Sternocleidomastoid: no     Range of Motion:     Flexion: Decreased    Extension: Decreased    Lateral rotation: Decreased    Lateral bending: Decreased     Neuro Examination: decreased sensation to light touch throughout, Strength 5/5 bilaterally, FROM     Special Tests:     Spurling test: positive   Results for orders placed or performed in visit on 10/20/16  Microscopic Examination  Result Value Ref Range   WBC, UA None seen 0 - 5 /hpf   RBC, UA 0-2 0 - 2 /hpf   Epithelial Cells (non renal) 0-10 0 - 10 /hpf   Renal Epithel, UA 0-10 (A) None seen /hpf   Bacteria, UA Few (A) None seen/Few  Urinalysis, Complete  Result Value Ref Range   Specific Gravity, UA 1.020 1.005 - 1.030   pH, UA 5.5 5.0 - 7.5   Color, UA Yellow Yellow   Appearance Ur Clear Clear   Leukocytes, UA Negative Negative   Protein, UA Negative Negative/Trace   Glucose, UA Negative Negative   Ketones, UA Trace (A) Negative   RBC, UA Trace (A) Negative   Bilirubin, UA Negative Negative   Urobilinogen, Ur 0.2 0.2 - 1.0 mg/dL   Nitrite, UA Negative Negative   Microscopic Examination See below:   Bladder Scan (Post Void Residual) in office  Result Value Ref Range   Scan Result 51ml       Assessment & Plan:   Problem List Items Addressed This Visit    None    Visit Diagnoses    Paresthesias    -  Primary   Abnormal neuro exam. S/p cervical surgery. Will get her back into neurosurgery ASAP.  They request MRI from [place]- ordered today.   Relevant Orders   MR Cervical Spine Wo Contrast   History of cervical spinal surgery       Will get her back in with her spinal surgery ASAP. Appointment set up. They request MRI from [place]- ordered today.   Relevant Orders   MR Cervical Spine Wo Contrast   Baker's cyst of knee, left       Will treat with naproxen and muscle relaxer and tramadol for now. Will likely need to get back to ortho- but will wait until her neck is dealt with.         Follow up plan: Return 2-4 weeks, for Follow up knee and neck depending on when she sees neurosurgery.

## 2016-11-23 NOTE — Addendum Note (Signed)
Addended by: Valerie Roys on: 11/23/2016 11:33 AM   Modules accepted: Orders

## 2016-11-24 ENCOUNTER — Telehealth: Payer: Self-pay | Admitting: Family Medicine

## 2016-11-24 NOTE — Telephone Encounter (Signed)
Called Hannah Kaiser with her results. 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 Continue current regimen, if getting worse, will start prednisone burst. Will see if we can get her into see neurosurgery sooner since she's already had her MRI.   Vevelyn Royals- can we see if we can get this set?

## 2016-11-24 NOTE — Telephone Encounter (Signed)
Hannah Kaiser returned my call. Patient's appointment was rescheduled to 12/04/16 at 1:15, arrive by 12:45 for paperwork. Hannah Kaiser needs for [place] to send the MRI results to her at [phone]. Patient notified of appointment change and results are being sent now.

## 2016-11-24 NOTE — Telephone Encounter (Signed)
[facility] and left a VM for Caryl Pina, Dr. Marchelle Folks administrative assistant, asking for her her to please return my call to reschedule patient's appointment.

## 2016-12-04 ENCOUNTER — Other Ambulatory Visit: Payer: Self-pay | Admitting: Neurosurgery

## 2016-12-16 ENCOUNTER — Encounter (HOSPITAL_COMMUNITY): Payer: Self-pay

## 2016-12-16 ENCOUNTER — Encounter (HOSPITAL_COMMUNITY)
Admission: RE | Admit: 2016-12-16 | Discharge: 2016-12-16 | Disposition: A | Discharge status: Home / Self Care | Payer: BLUE CROSS/BLUE SHIELD | Source: Ambulatory Visit | Attending: Neurosurgery | Admitting: Neurosurgery

## 2016-12-16 DIAGNOSIS — R011 Cardiac murmur, unspecified: Secondary | ICD-10-CM | POA: Insufficient documentation

## 2016-12-16 DIAGNOSIS — G4733 Obstructive sleep apnea (adult) (pediatric): Secondary | ICD-10-CM | POA: Insufficient documentation

## 2016-12-16 DIAGNOSIS — J45909 Unspecified asthma, uncomplicated: Secondary | ICD-10-CM | POA: Insufficient documentation

## 2016-12-16 DIAGNOSIS — Z01810 Encounter for preprocedural cardiovascular examination: Secondary | ICD-10-CM | POA: Insufficient documentation

## 2016-12-16 DIAGNOSIS — H409 Unspecified glaucoma: Secondary | ICD-10-CM | POA: Insufficient documentation

## 2016-12-16 DIAGNOSIS — J449 Chronic obstructive pulmonary disease, unspecified: Secondary | ICD-10-CM | POA: Insufficient documentation

## 2016-12-16 DIAGNOSIS — Z01812 Encounter for preprocedural laboratory examination: Secondary | ICD-10-CM | POA: Diagnosis not present

## 2016-12-16 DIAGNOSIS — F17200 Nicotine dependence, unspecified, uncomplicated: Secondary | ICD-10-CM | POA: Diagnosis not present

## 2016-12-16 DIAGNOSIS — I509 Heart failure, unspecified: Secondary | ICD-10-CM | POA: Insufficient documentation

## 2016-12-16 DIAGNOSIS — I110 Hypertensive heart disease with heart failure: Secondary | ICD-10-CM | POA: Diagnosis not present

## 2016-12-16 DIAGNOSIS — K219 Gastro-esophageal reflux disease without esophagitis: Secondary | ICD-10-CM | POA: Insufficient documentation

## 2016-12-16 HISTORY — DX: Heart failure, unspecified: I50.9

## 2016-12-16 HISTORY — DX: Pneumonia, unspecified organism: J18.9

## 2016-12-16 HISTORY — DX: Unspecified glaucoma: H40.9

## 2016-12-16 HISTORY — DX: Unspecified asthma, uncomplicated: J45.909

## 2016-12-16 HISTORY — DX: Cardiac arrhythmia, unspecified: I49.9

## 2016-12-16 HISTORY — DX: Polyneuropathy, unspecified: G62.9

## 2016-12-16 HISTORY — DX: Anxiety disorder, unspecified: F41.9

## 2016-12-16 LAB — BASIC METABOLIC PANEL WITH GFR
Anion gap: 6 (ref 5–15)
BUN: 13 mg/dL (ref 6–20)
CO2: 24 mmol/L (ref 22–32)
Calcium: 9 mg/dL (ref 8.9–10.3)
Chloride: 108 mmol/L (ref 101–111)
Creatinine, Ser: 0.98 mg/dL (ref 0.44–1.00)
GFR calc Af Amer: >60 mL/min
GFR calc non Af Amer: >60 mL/min
Glucose, Bld: 89 mg/dL (ref 65–99)
Potassium: 3.8 mmol/L (ref 3.5–5.1)
Sodium: 138 mmol/L (ref 135–145)

## 2016-12-16 LAB — SURGICAL PCR SCREEN
MRSA, PCR: NEGATIVE
Staphylococcus aureus: NEGATIVE

## 2016-12-16 LAB — CBC WITH DIFFERENTIAL/PLATELET
BASOS PCT: 0 %
Basophils Absolute: 0 10*3/uL (ref 0.0–0.1)
EOS ABS: 0.2 10*3/uL (ref 0.0–0.7)
Eosinophils Relative: 3 %
HEMATOCRIT: 37.1 % (ref 36.0–46.0)
HEMOGLOBIN: 12.2 g/dL (ref 12.0–15.0)
Lymphocytes Relative: 42 %
Lymphs Abs: 2.9 10*3/uL (ref 0.7–4.0)
MCH: 30 pg (ref 26.0–34.0)
MCHC: 32.9 g/dL (ref 30.0–36.0)
MCV: 91.2 fL (ref 78.0–100.0)
Monocytes Absolute: 0.5 10*3/uL (ref 0.1–1.0)
Monocytes Relative: 7 %
NEUTROS ABS: 3.3 10*3/uL (ref 1.7–7.7)
NEUTROS PCT: 48 %
Platelets: 227 10*3/uL (ref 150–400)
RBC: 4.07 MIL/uL (ref 3.87–5.11)
RDW: 13 % (ref 11.5–15.5)
WBC: 6.8 10*3/uL (ref 4.0–10.5)

## 2016-12-16 LAB — TYPE AND SCREEN
ABO/RH(D): A POS
ANTIBODY SCREEN: NEGATIVE

## 2016-12-16 LAB — ABO/RH: ABO/RH(D): A POS

## 2016-12-16 NOTE — Pre-Procedure Instructions (Signed)
Shaqueta Casady Zanders  12/16/2016      [facility] [address] [place], [place] - [address] [place] [address] Phone: [phone] Fax: [phone]  [facility] [address], [place] - [place] [address] Phone: [phone] Fax: [phone]  [facility] [place], [place] - [address] [facility] [address] [place] [address] Phone: [phone] Fax: [phone]    Your procedure is scheduled on July 24  Report to [facility] Admitting at 0600 A.M.  Call this number if you have problems the morning of surgery:  [phone]   Remember:  Do not eat food or drink liquids after midnight.   Take these medicines the morning of surgery with A SIP OF WATER albuterol (PROVENTIL HFA;VENTOLIN HFA) if needed, bring with you the day of surgery, amLODipine (NORVASC), atenolol (TENORMIN), clonazePAM (KLONOPIN), FLUoxetine (PROZAC), eye drops, omeprazole (PRILOSEC), pregabalin (LYRICA),  traMADol (ULTRAM) if needed, valACYclovir (VALTREX) if needed  7 days prior to surgery STOP taking any Aspirin, Aleve, Naproxen, Ibuprofen, Motrin, Advil, Goody's, BC's, all herbal medications, fish oil, and all vitamins    Do not wear jewelry, make-up or nail polish.  Do not wear lotions, powders, or perfumes, or deoderant.  Do not shave 48 hours prior to surgery.    Do not bring valuables to the hospital.  [facility] is not responsible for any belongings or valuables.  Contacts, dentures or bridgework may not be worn into surgery.  Leave your suitcase in the car.  After surgery it may be brought to your room.  For patients admitted to the hospital, discharge time will be determined by your treatment team.  Patients discharged the day of surgery will not be allowed to drive home.    Special instructions:   [facility]- Preparing For Surgery  Before surgery, you can play an important role.  Because skin is not sterile, your skin needs to be as free of germs as possible. You can reduce the number of germs on your skin by washing with CHG (chlorahexidine gluconate) Soap before surgery.  CHG is an antiseptic cleaner which kills germs and bonds with the skin to continue killing germs even after washing.  Please do not use if you have an allergy to CHG or antibacterial soaps. If your skin becomes reddened/irritated stop using the CHG.  Do not shave (including legs and underarms) for at least 48 hours prior to first CHG shower. It is OK to shave your face.  Please follow these instructions carefully.   1. Shower the NIGHT BEFORE SURGERY and the MORNING OF SURGERY with CHG.   2. If you chose to wash your hair, wash your hair first as usual with your normal shampoo.  3. After you shampoo, rinse your hair and body thoroughly to remove the shampoo.  4. Use CHG as you would any other liquid soap. You can apply CHG directly to the skin and wash gently with a scrungie or a clean washcloth.   5. Apply the CHG Soap to your body ONLY FROM THE NECK DOWN.  Do not use on open wounds or open sores. Avoid contact with your eyes, ears, mouth and genitals (private parts). Wash genitals (private parts) with your normal soap.  6. Wash thoroughly, paying special attention to the area where your surgery will be performed.  7. Thoroughly rinse your body with warm water from the neck down.  8. DO  NOT shower/wash with your normal soap after using and rinsing off the CHG Soap.  9. Pat yourself dry with a CLEAN TOWEL.   10. Wear CLEAN PAJAMAS   11. Place CLEAN SHEETS on your bed the night of your first shower and DO NOT SLEEP WITH PETS.    Day of Surgery: Do not apply any deodorants/lotions. Please wear clean clothes to the hospital/surgery center.      Please read over the following fact sheets that you were given.

## 2016-12-16 NOTE — Progress Notes (Signed)
PCP - [facility] [phone]  Cardiologist - Neoma Laming  Chest x-ray - Denies  EKG - 12/16/16  Stress Test - 3-4 yrs ago- Records requested  ECHO - 3-4 yrs ago- Records requested  Cardiac Cath - Denies  Sleep Study - Yes  CPAP - Uses, but unknown settings  Chart will be sent for review by anesthesia due to cardiac studies and record requests.   Pt denies having chest pain, sob, or fever at this time. All instructions explained to the pt, with a verbal understanding of the material. Pt agrees to go over the instructions while at home for a better understanding. The opportunity to ask questions was provided.

## 2016-12-17 ENCOUNTER — Encounter (HOSPITAL_COMMUNITY): Payer: Self-pay | Admitting: Emergency Medicine

## 2016-12-21 NOTE — Progress Notes (Addendum)
Anesthesia Chart Review:  Pt is a 56 year old female scheduled for L4-5 PLIF on 12/29/2016 with Earnie Larsson, M.D.  - PCP is Park Liter, DO - Cardiologist is Neoma Laming, MD, last office visit 09/30/15 - Neurologist is Jennings Books, MD (notes in care everywhere)   PMH includes: CHF, HTN, heart murmur, asthma, COPD, OSA, glaucoma, post-op N/V, GERD. Current smoker. BMI 37.  Medications include: Albuterol, amlodipine, atenolol, Zetia, Lasix, losartan, Prilosec  Preoperative labs reviewed.  EKG 12/16/16: Sinus bradycardia (56 bpm). Low voltage QRS.  Echo 09/30/15 (Dr. Laurelyn Sickle office): 1. Mildly dilated LA with LV, RA and RV, and aorta normal appearing in size 2. Normal LV systolic function 3. Normal RV systolic function. 4. Normal wall motion. 5. Mild LVH with grade 1 diastolic dysfunction. 6. Trace to mild pulmonary regurgitation. 7. Mild tricuspid regurgitation. 8. Normal pulmonary artery pressure. 9. Mild mitral regurgitation. 10. Trivial anterior and posterior pericardial effusion without hemodynamic compromise or tamponade  Nuclear stress test 09/23/15 (Dr. Laurelyn Sickle office): Normal study with normal LVEF 72%.  Carotid duplex 01/05/13 (Dr. Laurelyn Sickle office): No significant carotid disease with antegrade flow and vertebrals   A review of notes by neurologist Dr. Manuella Ghazi shows pt reported a syncopal episode at office visit 11/11/15 and 2 subsequent syncopal episodes at office visit 03/16/16.  An EEG was ordered to evaluate syncope, but this has not been done and pt has not returned to neurology for follow up.  Pt has not seen cardiologist since April 2017, prior to syncopal episodes.   Reviewed case with Dr. Roanna Banning.  Pt will need to f/u with neurology for syncope work up prior to surgery.  I notified Lorriane Shire in Dr. Marchelle Folks office.   Willeen Cass, FNP-BC [facility] Short Stay Surgical Center/Anesthesiology Phone: [phone] 12/22/2016 11:55 AM  Addendum:   Pt saw neurologist Jennings Books, MD on  12/24/16 for syncope.  His note indicates pt had 4th syncopal episode in 05/2016, but none since.  He felt EEG no longer warranted given no new syncope since 05/2016, but suggested medical and cardiac evaluation of syncope.    Reviewed case with Dr. Smith Robert.  Pt will need to continue with syncope evaluation by following up with cardiology.  I notified Lorriane Shire in Dr. Marchelle Folks office.  Pt has appt to see her cardiologist Dr. Humphrey Rolls on 12/25/16.    Willeen Cass, FNP-BC [facility] Short Stay Surgical Center/Anesthesiology Phone: [phone] 12/25/2016 4:52 PM  Addendum: Cardiology records Neoma Laming, MD are still pending, but it appears patient is now scheduled for a cardiac cath at [facility] on 12/29/16.  George Hugh [facility] Short Stay Center/Anesthesiology Phone [phone] 12/28/2016 2:46 PM

## 2016-12-28 ENCOUNTER — Other Ambulatory Visit: Payer: Self-pay | Admitting: Cardiovascular Disease

## 2016-12-28 MED ORDER — CEFAZOLIN SODIUM-DEXTROSE 2-4 GM/100ML-% IV SOLN: 2.0000 g | INTRAVENOUS | Status: AC

## 2016-12-28 NOTE — Progress Notes (Signed)
Faxed request to Dr. Trish Mage office for last office note, clearance note, any cardiac test done.[phone].

## 2016-12-29 ENCOUNTER — Ambulatory Visit
Admission: RE | Admit: 2016-12-29 | Discharge: 2016-12-29 | Disposition: A | Discharge status: Home / Self Care | Payer: BLUE CROSS/BLUE SHIELD | Source: Ambulatory Visit | Attending: Cardiovascular Disease | Admitting: Cardiovascular Disease

## 2016-12-29 ENCOUNTER — Encounter
Admission: RE | Disposition: A | Discharge status: Home / Self Care | Payer: Self-pay | Source: Ambulatory Visit | Attending: Cardiovascular Disease

## 2016-12-29 ENCOUNTER — Inpatient Hospital Stay (HOSPITAL_COMMUNITY): Admission: RE | Admit: 2016-12-29 | Payer: BLUE CROSS/BLUE SHIELD | Source: Ambulatory Visit | Admitting: Neurosurgery

## 2016-12-29 ENCOUNTER — Encounter: Payer: Self-pay | Admitting: Cardiovascular Disease

## 2016-12-29 DIAGNOSIS — R079 Chest pain, unspecified: Secondary | ICD-10-CM | POA: Diagnosis present

## 2016-12-29 DIAGNOSIS — I2510 Atherosclerotic heart disease of native coronary artery without angina pectoris: Secondary | ICD-10-CM | POA: Insufficient documentation

## 2016-12-29 DIAGNOSIS — F17200 Nicotine dependence, unspecified, uncomplicated: Secondary | ICD-10-CM | POA: Insufficient documentation

## 2016-12-29 DIAGNOSIS — Z8249 Family history of ischemic heart disease and other diseases of the circulatory system: Secondary | ICD-10-CM | POA: Diagnosis not present

## 2016-12-29 DIAGNOSIS — G4739 Other sleep apnea: Secondary | ICD-10-CM | POA: Insufficient documentation

## 2016-12-29 DIAGNOSIS — Z881 Allergy status to other antibiotic agents status: Secondary | ICD-10-CM | POA: Diagnosis not present

## 2016-12-29 DIAGNOSIS — H409 Unspecified glaucoma: Secondary | ICD-10-CM | POA: Insufficient documentation

## 2016-12-29 DIAGNOSIS — Z79899 Other long term (current) drug therapy: Secondary | ICD-10-CM | POA: Diagnosis not present

## 2016-12-29 DIAGNOSIS — K219 Gastro-esophageal reflux disease without esophagitis: Secondary | ICD-10-CM | POA: Insufficient documentation

## 2016-12-29 DIAGNOSIS — Z888 Allergy status to other drugs, medicaments and biological substances status: Secondary | ICD-10-CM | POA: Diagnosis not present

## 2016-12-29 DIAGNOSIS — J449 Chronic obstructive pulmonary disease, unspecified: Secondary | ICD-10-CM | POA: Diagnosis not present

## 2016-12-29 DIAGNOSIS — E785 Hyperlipidemia, unspecified: Secondary | ICD-10-CM | POA: Diagnosis not present

## 2016-12-29 DIAGNOSIS — I340 Nonrheumatic mitral (valve) insufficiency: Secondary | ICD-10-CM | POA: Insufficient documentation

## 2016-12-29 DIAGNOSIS — I10 Essential (primary) hypertension: Secondary | ICD-10-CM | POA: Insufficient documentation

## 2016-12-29 HISTORY — PX: LEFT HEART CATH AND CORONARY ANGIOGRAPHY: CATH118249

## 2016-12-29 SURGERY — POSTERIOR LUMBAR FUSION 1 WITH HARDWARE REMOVAL
Anesthesia: General | Site: Back

## 2016-12-29 SURGERY — LEFT HEART CATH AND CORONARY ANGIOGRAPHY
Anesthesia: Moderate Sedation | Laterality: Left

## 2016-12-29 MED ORDER — SODIUM CHLORIDE 0.9 % WEIGHT BASED INFUSION: 1.0000 mL/kg/h | INTRAVENOUS | Status: DC

## 2016-12-29 MED ORDER — SODIUM CHLORIDE 0.9 % WEIGHT BASED INFUSION
3.0000 mL/kg/h | INTRAVENOUS | Status: DC
  Administered 2016-12-29: 3 mL/kg/h via INTRAVENOUS

## 2016-12-29 MED ORDER — HEPARIN (PORCINE) IN NACL 2-0.9 UNIT/ML-% IJ SOLN
INTRAMUSCULAR | Status: AC
  Filled 2016-12-29: qty 500

## 2016-12-29 MED ORDER — ASPIRIN 81 MG PO CHEW
81.0000 mg | CHEWABLE_TABLET | ORAL | Status: AC
  Administered 2016-12-29: 81 mg via ORAL

## 2016-12-29 MED ORDER — SODIUM CHLORIDE 0.9% FLUSH: 3.0000 mL | Freq: Two times a day (BID) | INTRAVENOUS | Status: DC

## 2016-12-29 MED ORDER — ONDANSETRON HCL 4 MG/2ML IJ SOLN: 4.0000 mg | Freq: Four times a day (QID) | INTRAMUSCULAR | Status: DC | PRN

## 2016-12-29 MED ORDER — SODIUM CHLORIDE 0.9 % IV SOLN: 250.0000 mL | INTRAVENOUS | Status: DC | PRN

## 2016-12-29 MED ORDER — SODIUM CHLORIDE 0.9 % WEIGHT BASED INFUSION: 3.0000 mL/kg/h | INTRAVENOUS | Status: AC

## 2016-12-29 MED ORDER — IOPAMIDOL (ISOVUE-300) INJECTION 61%
INTRAVENOUS | Status: DC | PRN
  Administered 2016-12-29: 110 mL via INTRA_ARTERIAL

## 2016-12-29 MED ORDER — ACETAMINOPHEN 325 MG PO TABS: 650.0000 mg | ORAL_TABLET | ORAL | Status: DC | PRN

## 2016-12-29 MED ORDER — ASPIRIN 81 MG PO CHEW
CHEWABLE_TABLET | ORAL | Status: AC
  Filled 2016-12-29: qty 1

## 2016-12-29 MED ORDER — FENTANYL CITRATE (PF) 100 MCG/2ML IJ SOLN
INTRAMUSCULAR | Status: DC | PRN
  Administered 2016-12-29: 25 ug via INTRAVENOUS

## 2016-12-29 MED ORDER — SODIUM CHLORIDE 0.9% FLUSH: 3.0000 mL | INTRAVENOUS | Status: DC | PRN

## 2016-12-29 MED ORDER — SODIUM CHLORIDE 0.9 % WEIGHT BASED INFUSION: 1.0000 mL/kg/h | INTRAVENOUS | Status: AC

## 2016-12-29 MED ORDER — ACETAMINOPHEN 325 MG PO TABS
650.0000 mg | ORAL_TABLET | ORAL | Status: DC | PRN
  Administered 2016-12-29: 650 mg via ORAL

## 2016-12-29 MED ORDER — MIDAZOLAM HCL 2 MG/2ML IJ SOLN
INTRAMUSCULAR | Status: AC
  Filled 2016-12-29: qty 2

## 2016-12-29 MED ORDER — MIDAZOLAM HCL 2 MG/2ML IJ SOLN
INTRAMUSCULAR | Status: DC | PRN
  Administered 2016-12-29: 1 mg via INTRAVENOUS

## 2016-12-29 MED ORDER — ASPIRIN 81 MG PO CHEW: 81.0000 mg | CHEWABLE_TABLET | ORAL | Status: DC

## 2016-12-29 MED ORDER — ACETAMINOPHEN 325 MG PO TABS
ORAL_TABLET | ORAL | Status: AC
  Filled 2016-12-29: qty 2

## 2016-12-29 MED ORDER — FENTANYL CITRATE (PF) 100 MCG/2ML IJ SOLN
INTRAMUSCULAR | Status: AC
  Filled 2016-12-29: qty 2

## 2016-12-29 SURGICAL SUPPLY — 8 items
CATH 5FR JR4 DIAGNOSTIC (CATHETERS) ×2 IMPLANT
CATH 5FR PIGTAIL DIAGNOSTIC (CATHETERS) ×2 IMPLANT
CATH INFINITI 5FR JL4 (CATHETERS) ×2 IMPLANT
KIT MANI 3VAL PERCEP (MISCELLANEOUS) ×2 IMPLANT
NEEDLE PERC 18GX7CM (NEEDLE) ×2 IMPLANT
PACK CARDIAC CATH (CUSTOM PROCEDURE TRAY) ×2 IMPLANT
SHEATH PINNACLE 5F 10CM (SHEATH) ×2 IMPLANT
WIRE EMERALD 3MM-J .035X150CM (WIRE) ×2 IMPLANT

## 2016-12-29 NOTE — Discharge Instructions (Signed)
Angiogram, Care After °This sheet gives you information about how to care for yourself after your procedure. Your health care provider may also give you more specific instructions. If you have problems or questions, contact your health care provider. °What can I expect after the procedure? °After the procedure, it is common to have bruising and tenderness at the catheter insertion area. °Follow these instructions at home: °Insertion site care °· Follow instructions from your health care provider about how to take care of your insertion site. Make sure you: °? Wash your hands with soap and water before you change your bandage (dressing). If soap and water are not available, use hand sanitizer. °? Change your dressing as told by your health care provider. °? Leave stitches (sutures), skin glue, or adhesive strips in place. These skin closures may need to stay in place for 2 weeks or longer. If adhesive strip edges start to loosen and curl up, you may trim the loose edges. Do not remove adhesive strips completely unless your health care provider tells you to do that. °· Do not take baths, swim, or use a hot tub until your health care provider approves. °· You may shower 24-48 hours after the procedure or as told by your health care provider. °? Gently wash the site with plain soap and water. °? Pat the area dry with a clean towel. °? Do not rub the site. This may cause bleeding. °· Do not apply powder or lotion to the site. Keep the site clean and dry. °· Check your insertion site every day for signs of infection. Check for: °? Redness, swelling, or pain. °? Fluid or blood. °? Warmth. °? Pus or a bad smell. °Activity °· Rest as told by your health care provider, usually for 1-2 days. °· Do not lift anything that is heavier than 10 lbs. (4.5 kg) or as told by your health care provider. °· Do not drive for 24 hours if you were given a medicine to help you relax (sedative). °· Do not drive or use heavy machinery while  taking prescription pain medicine. °General instructions °· Return to your normal activities as told by your health care provider, usually in about a week. Ask your health care provider what activities are safe for you. °· If the catheter site starts bleeding, lie flat and put pressure on the site. If the bleeding does not stop, get help right away. This is a medical emergency. °· Drink enough fluid to keep your urine clear or pale yellow. This helps flush the contrast dye from your body. °· Take over-the-counter and prescription medicines only as told by your health care provider. °· Keep all follow-up visits as told by your health care provider. This is important. °Contact a health care provider if: °· You have a fever or chills. °· You have redness, swelling, or pain around your insertion site. °· You have fluid or blood coming from your insertion site. °· The insertion site feels warm to the touch. °· You have pus or a bad smell coming from your insertion site. °· You have bruising around the insertion site. °· You notice blood collecting in the tissue around the catheter site (hematoma). The hematoma may be painful to the touch. °Get help right away if: °· You have severe pain at the catheter insertion area. °· The catheter insertion area swells very fast. °· The catheter insertion area is bleeding, and the bleeding does not stop when you hold steady pressure on the area. °·   The area near or just beyond the catheter insertion site becomes pale, cool, tingly, or numb. These symptoms may represent a serious problem that is an emergency. Do not wait to see if the symptoms will go away. Get medical help right away. Call your local emergency services (911 in the [place] Do not drive yourself to the hospital. Summary  After the procedure, it is common to have bruising and tenderness at the catheter insertion area.  After the procedure, it is important to rest and drink plenty of fluids.  Do not take baths,  swim, or use a hot tub until your health care provider says it is okay to do so. You may shower 24-48 hours after the procedure or as told by your health care provider.  If the catheter site starts bleeding, lie flat and put pressure on the site. If the bleeding does not stop, get help right away. This is a medical emergency. This information is not intended to replace advice given to you by your health care provider. Make sure you discuss any questions you have with your health care provider. Document Released: 12/11/2004 Document Revised: 04/29/2016 Document Reviewed: 04/29/2016 Elsevier Interactive Patient Education  2017 [facility]. Moderate Conscious Sedation, Adult, Care After These instructions provide you with information about caring for yourself after your procedure. Your health care provider may also give you more specific instructions. Your treatment has been planned according to current medical practices, but problems sometimes occur. Call your health care provider if you have any problems or questions after your procedure. What can I expect after the procedure? After your procedure, it is common:  To feel sleepy for several hours.  To feel clumsy and have poor balance for several hours.  To have poor judgment for several hours.  To vomit if you eat too soon.  Follow these instructions at home: For at least 24 hours after the procedure:   Do not: ? Participate in activities where you could fall or become injured. ? Drive. ? Use heavy machinery. ? Drink alcohol. ? Take sleeping pills or medicines that cause drowsiness. ? Make important decisions or sign legal documents. ? Take care of children on your own.  Rest. Eating and drinking  Follow the diet recommended by your health care provider.  If you vomit: ? Drink water, juice, or soup when you can drink without vomiting. ? Make sure you have little or no nausea before eating solid foods. General  instructions  Have a responsible adult stay with you until you are awake and alert.  Take over-the-counter and prescription medicines only as told by your health care provider.  If you smoke, do not smoke without supervision.  Keep all follow-up visits as told by your health care provider. This is important. Contact a health care provider if:  You keep feeling nauseous or you keep vomiting.  You feel light-headed.  You develop a rash.  You have a fever. Get help right away if:  You have trouble breathing. This information is not intended to replace advice given to you by your health care provider. Make sure you discuss any questions you have with your health care provider. Document Released: 03/15/2013 Document Revised: 10/28/2015 Document Reviewed: 09/14/2015 Elsevier Interactive Patient Education  2018 [employer] After This sheet gives you information about how to care for yourself after your procedure. Your health care provider may also give you more specific instructions. If you have problems or questions, contact your health care provider. What  can I expect after the procedure? After the procedure, it is common to have bruising and tenderness at the catheter insertion area. Follow these instructions at home: Insertion site care  Follow instructions from your health care provider about how to take care of your insertion site. Make sure you: ? Wash your hands with soap and water before you change your bandage (dressing). If soap and water are not available, use hand sanitizer. ? Change your dressing as told by your health care provider. ? Leave stitches (sutures), skin glue, or adhesive strips in place. These skin closures may need to stay in place for 2 weeks or longer. If adhesive strip edges start to loosen and curl up, you may trim the loose edges. Do not remove adhesive strips completely unless your health care provider tells you to do that.  Do not  take baths, swim, or use a hot tub until your health care provider approves.  You may shower 24-48 hours after the procedure or as told by your health care provider. ? Gently wash the site with plain soap and water. ? Pat the area dry with a clean towel. ? Do not rub the site. This may cause bleeding.  Do not apply powder or lotion to the site. Keep the site clean and dry.  Check your insertion site every day for signs of infection. Check for: ? Redness, swelling, or pain. ? Fluid or blood. ? Warmth. ? Pus or a bad smell. Activity  Rest as told by your health care provider, usually for 1-2 days.  Do not lift anything that is heavier than 10 lbs. (4.5 kg) or as told by your health care provider.  Do not drive for 24 hours if you were given a medicine to help you relax (sedative).  Do not drive or use heavy machinery while taking prescription pain medicine. General instructions  Return to your normal activities as told by your health care provider, usually in about a week. Ask your health care provider what activities are safe for you.  If the catheter site starts bleeding, lie flat and put pressure on the site. If the bleeding does not stop, get help right away. This is a medical emergency.  Drink enough fluid to keep your urine clear or pale yellow. This helps flush the contrast dye from your body.  Take over-the-counter and prescription medicines only as told by your health care provider.  Keep all follow-up visits as told by your health care provider. This is important. Contact a health care provider if:  You have a fever or chills.  You have redness, swelling, or pain around your insertion site.  You have fluid or blood coming from your insertion site.  The insertion site feels warm to the touch.  You have pus or a bad smell coming from your insertion site.  You have bruising around the insertion site.  You notice blood collecting in the tissue around the catheter  site (hematoma). The hematoma may be painful to the touch. Get help right away if:  You have severe pain at the catheter insertion area.  The catheter insertion area swells very fast.  The catheter insertion area is bleeding, and the bleeding does not stop when you hold steady pressure on the area.  The area near or just beyond the catheter insertion site becomes pale, cool, tingly, or numb. These symptoms may represent a serious problem that is an emergency. Do not wait to see if the symptoms will go away. Get  medical help right away. Call your local emergency services (911 in the [place] Do not drive yourself to the hospital. Summary  After the procedure, it is common to have bruising and tenderness at the catheter insertion area.  After the procedure, it is important to rest and drink plenty of fluids.  Do not take baths, swim, or use a hot tub until your health care provider says it is okay to do so. You may shower 24-48 hours after the procedure or as told by your health care provider.  If the catheter site starts bleeding, lie flat and put pressure on the site. If the bleeding does not stop, get help right away. This is a medical emergency. This information is not intended to replace advice given to you by your health care provider. Make sure you discuss any questions you have with your health care provider. Document Released: 12/11/2004 Document Revised: 04/29/2016 Document Reviewed: 04/29/2016 Elsevier Interactive Patient Education  2017 [employer].

## 2017-01-01 ENCOUNTER — Other Ambulatory Visit: Payer: Self-pay | Admitting: Neurosurgery

## 2017-01-05 ENCOUNTER — Ambulatory Visit (INDEPENDENT_AMBULATORY_CARE_PROVIDER_SITE_OTHER): Payer: BLUE CROSS/BLUE SHIELD | Admitting: Family Medicine

## 2017-01-05 ENCOUNTER — Ambulatory Visit
Admission: RE | Admit: 2017-01-05 | Discharge: 2017-01-05 | Disposition: A | Discharge status: Home / Self Care | Payer: BLUE CROSS/BLUE SHIELD | Source: Ambulatory Visit | Attending: Family Medicine | Admitting: Family Medicine

## 2017-01-05 ENCOUNTER — Telehealth: Payer: Self-pay | Admitting: Family Medicine

## 2017-01-05 ENCOUNTER — Encounter: Payer: Self-pay | Admitting: Family Medicine

## 2017-01-05 VITALS — BP 145/85 | HR 64 | Temp 98.8°F | Wt 218.3 lb

## 2017-01-05 DIAGNOSIS — M47894 Other spondylosis, thoracic region: Secondary | ICD-10-CM | POA: Diagnosis not present

## 2017-01-05 DIAGNOSIS — R202 Paresthesia of skin: Secondary | ICD-10-CM

## 2017-01-05 DIAGNOSIS — T148XXA Other injury of unspecified body region, initial encounter: Secondary | ICD-10-CM

## 2017-01-05 DIAGNOSIS — F332 Major depressive disorder, recurrent severe without psychotic features: Secondary | ICD-10-CM | POA: Diagnosis not present

## 2017-01-05 MED ORDER — CLONAZEPAM 0.5 MG PO TABS: 0.5000 mg | ORAL_TABLET | Freq: Every day | ORAL | 0 refills | Status: DC | PRN

## 2017-01-05 NOTE — Progress Notes (Signed)
BP (!) 145/85   Pulse 64   Temp 98.8 F (37.1 C)   Wt 218 lb 4.8 oz (99 kg)   SpO2 99%   BMI 37.47 kg/m    Subjective:    Patient ID: Hannah Kaiser, female    DOB: Apr 25, 1961, 56 y.o.   MRN: [id number]  HPI: Hannah Kaiser is a 56 y.o. female  Chief Complaint  Patient presents with  . Incisional Pain    pt states she feels a knot in her incision   . Arm Pain    pt states her upper left arm has been hurting    LUMP- Had Cath done last week. Noticed a lump in her incision. Was nervous. Came in to get it checked out.  Duration: Since the surgery Location: R groin Onset: sudden Painful: yes Discomfort: yes Status:  bigger Trauma: yes Redness: yes Bruising: yes Recent infection: no Swollen lymph nodes: no Requesting removal: no History of cancer: no Family history of cancer: no History of the same: no Associated signs and symptoms: no fevers, has been having sweats, otherwise doing well  ARM PAIN- still numb and tingling. Has been having pain in her L arm. Saw cardiology who thought it was all coming from her heart. 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 Has not discussed it fully with her neurosurgeon yet. Has had no imaging done of her thoracic region. Still numb and tingly from her shoulders to right under her breasts. No change. There all the time. Not coming and going.   ANXIETY/STRESS Duration:exacerbated Anxious mood: yes  Excessive worrying: yes Irritability: yes  Sweating: no Nausea: no Palpitations:no Hyperventilation: no Panic attacks: yes  Agoraphobia: no  Obscessions/compulsions: no Depressed mood: yes Depression screen [facility] 2/9 01/05/2017 09/22/2016 03/27/2016 02/24/2016 12/03/2015  Decreased Interest 3 3 3 1 3   Down, Depressed, Hopeless 2 3 3 2 3   PHQ - 2 Score 5 6 6 3 6   Altered sleeping 3 3 3 3 3   Tired, decreased energy 3 3 3 3 3   Change in appetite 3 3 3 1 3   Feeling bad or failure about yourself  2 3 3 2 3   Trouble  concentrating 3 3 2 3 3   Moving slowly or fidgety/restless 3 3 3 1 3   Suicidal thoughts 0 3 3 0 0  PHQ-9 Score 22 27 26 16 24   Difficult doing work/chores Somewhat difficult - - - Very difficult   Anhedonia: no Weight changes: no Insomnia: yes hard to fall asleep  Hypersomnia: no Fatigue/loss of energy: yes Feelings of worthlessness: yes Feelings of guilt: yes Impaired concentration/indecisiveness: yes Suicidal ideations: no  Crying spells: yes Recent Stressors/Life Changes: yes   Relationship problems: yes   Family stress: yes     Financial stress: yes    Job stress: no    Recent death/loss: no  Relevant past medical, surgical, family and social history reviewed and updated as indicated. Interim medical history since our last visit reviewed. Allergies and medications reviewed and updated.  Review of Systems  Constitutional: Negative.   Respiratory: Negative.   Cardiovascular: Negative.   Psychiatric/Behavioral: Positive for agitation, decreased concentration, dysphoric mood and sleep disturbance. Negative for behavioral problems, confusion, hallucinations, self-injury and suicidal ideas. The patient is nervous/anxious. The patient is not hyperactive.     Per HPI unless specifically indicated above     Objective:    BP (!) 145/85   Pulse 64   Temp 98.8 F (37.1 C)  Wt 218 lb 4.8 oz (99 kg)   SpO2 99%   BMI 37.47 kg/m   Wt Readings from Last 3 Encounters:  01/05/17 218 lb 4.8 oz (99 kg)  12/29/16 217 lb (98.4 kg)  12/16/16 217 lb 9.6 oz (98.7 kg)    Physical Exam  Constitutional: She is oriented to person, place, and time. She appears well-developed and well-nourished. No distress.  HENT:  Head: Normocephalic and atraumatic.  Right Ear: Hearing normal.  Left Ear: Hearing normal.  Nose: Nose normal.  Eyes: Conjunctivae and lids are normal. Right eye exhibits no discharge. Left eye exhibits no discharge. No scleral icterus.  Cardiovascular: Normal rate,  regular rhythm, normal heart sounds and intact distal pulses.  Exam reveals no gallop and no friction rub.   No murmur heard. Pulmonary/Chest: Effort normal and breath sounds normal. No respiratory distress. She has no wheezes. She has no rales. She exhibits no tenderness.  Musculoskeletal: Normal range of motion.  Bruising in R groin, small hematoma under skin in flexural fold, no heat, no redness, no swelling.   Neurological: She is alert and oriented to person, place, and time.  Skin: Skin is warm, dry and intact. No rash noted. She is not diaphoretic. No erythema. No pallor.  Psychiatric: She has a normal mood and affect. Her speech is normal and behavior is normal. Judgment and thought content normal. Cognition and memory are normal.  Nursing note and vitals reviewed.   Results for orders placed or performed during the hospital encounter of 12/16/16  Surgical pcr screen  Result Value Ref Range   MRSA, PCR NEGATIVE NEGATIVE   Staphylococcus aureus NEGATIVE NEGATIVE  Basic metabolic panel  Result Value Ref Range   Sodium 138 135 - 145 mmol/L   Potassium 3.8 3.5 - 5.1 mmol/L   Chloride 108 101 - 111 mmol/L   CO2 24 22 - 32 mmol/L   Glucose, Bld 89 65 - 99 mg/dL   BUN 13 6 - 20 mg/dL   Creatinine, Ser 0.98 0.44 - 1.00 mg/dL   Calcium 9.0 8.9 - 10.3 mg/dL   GFR calc non Af Amer >60 >60 mL/min   GFR calc Af Amer >60 >60 mL/min   Anion gap 6 5 - 15  CBC with Differential/Platelet  Result Value Ref Range   WBC 6.8 4.0 - 10.5 K/uL   RBC 4.07 3.87 - 5.11 MIL/uL   Hemoglobin 12.2 12.0 - 15.0 g/dL   HCT 37.1 36.0 - 46.0 %   MCV 91.2 78.0 - 100.0 fL   MCH 30.0 26.0 - 34.0 pg   MCHC 32.9 30.0 - 36.0 g/dL   RDW 13.0 11.5 - 15.5 %   Platelets 227 150 - 400 K/uL   Neutrophils Relative % 48 %   Neutro Abs 3.3 1.7 - 7.7 K/uL   Lymphocytes Relative 42 %   Lymphs Abs 2.9 0.7 - 4.0 K/uL   Monocytes Relative 7 %   Monocytes Absolute 0.5 0.1 - 1.0 K/uL   Eosinophils Relative 3 %    Eosinophils Absolute 0.2 0.0 - 0.7 K/uL   Basophils Relative 0 %   Basophils Absolute 0.0 0.0 - 0.1 K/uL  Type and screen  Result Value Ref Range   ABO/RH(D) A POS    Antibody Screen NEG    Sample Expiration 12/30/2016    Extend sample reason NO TRANSFUSIONS OR PREGNANCY IN THE PAST 3 MONTHS   ABO/Rh  Result Value Ref Range   ABO/RH(D) A POS  Assessment & Plan:   Problem List Items Addressed This Visit      Other   Depression    Acting up. Needs refill of her klonopin. Has been 3 months. Refill given today.      Relevant Medications   FLUoxetine (PROZAC) 40 MG capsule    Other Visit Diagnoses    Hematoma    -  Primary   No sign of infection. Small. Continue heat on it. Call with any changes.    Paresthesias       Will discuss with her neurosurgeon, but we will obtain thoracic x-ray to look for arthritis. Ordered today.   Relevant Orders   DG Thoracic Spine W/Swimmers       Follow up plan: Return in about 3 months (around 04/07/2017) for 6 month follow up.

## 2017-01-05 NOTE — Telephone Encounter (Signed)
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 She should definitely talk about it with her neurosurgeon when she sees him.

## 2017-01-05 NOTE — Telephone Encounter (Signed)
Patient notified about results.

## 2017-01-05 NOTE — Assessment & Plan Note (Signed)
Acting up. Needs refill of her klonopin. Has been 3 months. Refill given today.

## 2017-01-12 ENCOUNTER — Other Ambulatory Visit: Payer: Self-pay

## 2017-01-12 MED ORDER — TRAMADOL HCL 50 MG PO TABS
50.0000 mg | ORAL_TABLET | Freq: Four times a day (QID) | ORAL | 0 refills | Status: DC | PRN
Start: 2017-01-12 — End: 2019-03-01

## 2017-01-12 NOTE — Pre-Procedure Instructions (Signed)
[place]  01/12/2017      [facility] [place], [place] - [place] [address] [place] [address] Phone: [phone] Fax: [phone]  [place] [address] [place] [address] Phone: [phone] Fax: [phone]  [facility] [place], [place] - [address] [place] [address] Phone: [phone] Fax: [phone]    Your procedure is scheduled on Tuesday, 01/19/2017.  Report to [facility] Admitting at 08:00 A.M.  Call this number if you have problems the morning of surgery:  [phone]   Remember:   Do not eat food or drink liquids after midnight.   Continue taking your medications as directed by your physician except following these instructions about your medications:   Take these medicines the morning of surgery with A SIP OF WATER: amlodipine (Norvasc), atenolol (Tenormin), clonazepam (Klonopin), fluoxetine (Prozac), hydroxyzine (Atarax), pantoprazole (protonix), pregabalin (Lyrica), tramadol (Ultram), valacyclovir (Valtrex), albuterol inhaler - if needed  7 days prior to surgery STOP taking any Aspirin, Aleve, Naproxen, Ibuprofen, Motrin, Advil, Goody's, BC's, all herbal medications, fish oil, and all vitamins    Do not wear jewelry, make-up or nail polish.  Do not wear lotions, powders, or perfumes, or deodorant.  Do not shave 48 hours prior to surgery.    Do not bring valuables to the hospital.  [facility] is not responsible for any belongings or valuables.  Contacts, dentures or bridgework may not be worn into surgery.  Leave your suitcase in the car.  After surgery it may be brought to your room.  For patients admitted to the hospital, discharge time will be determined by your treatment team.  Patients discharged the day of surgery will not be allowed to drive home.   Name and phone number of your driver:     Special instructions:   [place]- Preparing For Surgery  Before surgery, you can play an important role. Because skin is not sterile, your skin needs to be as free of germs as possible. You can reduce the number of germs on your skin by washing with CHG (chlorahexidine gluconate) Soap before surgery.  CHG is an antiseptic cleaner which kills germs and bonds with the skin to continue killing germs even after washing.  Please do not use if you have an allergy to CHG or antibacterial soaps. If your skin becomes reddened/irritated stop using the CHG.  Do not shave (including legs and underarms) for at least 48 hours prior to first CHG shower. It is OK to shave your face.  Please follow these instructions carefully.   1. Shower the NIGHT BEFORE SURGERY and the MORNING OF SURGERY with CHG.   2. If you chose to wash your hair, wash your hair first as usual with your normal shampoo.  3. After you shampoo, rinse your hair and body thoroughly to remove the shampoo.  4. Use CHG as you would any other liquid soap. You can apply CHG directly to the skin and wash gently with a scrungie or a clean washcloth.   5. Apply the CHG Soap to your body ONLY FROM THE NECK DOWN.  Do not use on open wounds or open sores. Avoid contact with your eyes, ears, mouth and genitals (private parts). Wash genitals (private parts) with your normal soap.  6. Wash thoroughly, paying special attention to the area where your surgery will be performed.  7. Thoroughly rinse your body with warm water from the neck down.  8. DO NOT shower/wash with your normal soap after using and rinsing off the CHG Soap.  9. Pat yourself dry with a CLEAN TOWEL.   10. Wear CLEAN PAJAMAS   11. Place CLEAN SHEETS on your bed the night of your first shower and DO NOT SLEEP WITH PETS.    Day of Surgery: Shower as directed above Do not apply any deodorants/lotions. Please wear clean clothes to the hospital/surgery center.       Please read over the following fact sheets that you were given. Pain Booklet, Coughing and Deep Breathing, MRSA Information and Surgical Site Infection Prevention

## 2017-01-13 ENCOUNTER — Encounter (HOSPITAL_COMMUNITY): Payer: Self-pay

## 2017-01-13 ENCOUNTER — Encounter (HOSPITAL_COMMUNITY)
Admission: RE | Admit: 2017-01-13 | Discharge: 2017-01-13 | Disposition: A | Discharge status: Home / Self Care | Payer: BLUE CROSS/BLUE SHIELD | Source: Ambulatory Visit | Attending: Neurosurgery | Admitting: Neurosurgery

## 2017-01-13 DIAGNOSIS — D125 Benign neoplasm of sigmoid colon: Secondary | ICD-10-CM | POA: Diagnosis not present

## 2017-01-13 DIAGNOSIS — K2970 Gastritis, unspecified, without bleeding: Secondary | ICD-10-CM | POA: Diagnosis not present

## 2017-01-13 DIAGNOSIS — F329 Major depressive disorder, single episode, unspecified: Secondary | ICD-10-CM | POA: Diagnosis not present

## 2017-01-13 DIAGNOSIS — M170 Bilateral primary osteoarthritis of knee: Secondary | ICD-10-CM | POA: Insufficient documentation

## 2017-01-13 DIAGNOSIS — R1310 Dysphagia, unspecified: Secondary | ICD-10-CM | POA: Insufficient documentation

## 2017-01-13 DIAGNOSIS — B009 Herpesviral infection, unspecified: Secondary | ICD-10-CM | POA: Insufficient documentation

## 2017-01-13 DIAGNOSIS — E785 Hyperlipidemia, unspecified: Secondary | ICD-10-CM | POA: Diagnosis not present

## 2017-01-13 DIAGNOSIS — R8299 Other abnormal findings in urine: Secondary | ICD-10-CM | POA: Insufficient documentation

## 2017-01-13 DIAGNOSIS — Z79899 Other long term (current) drug therapy: Secondary | ICD-10-CM | POA: Diagnosis not present

## 2017-01-13 DIAGNOSIS — J449 Chronic obstructive pulmonary disease, unspecified: Secondary | ICD-10-CM | POA: Diagnosis not present

## 2017-01-13 DIAGNOSIS — Z86010 Personal history of colonic polyps: Secondary | ICD-10-CM | POA: Diagnosis not present

## 2017-01-13 DIAGNOSIS — I129 Hypertensive chronic kidney disease with stage 1 through stage 4 chronic kidney disease, or unspecified chronic kidney disease: Secondary | ICD-10-CM | POA: Diagnosis not present

## 2017-01-13 DIAGNOSIS — N8110 Cystocele, unspecified: Secondary | ICD-10-CM | POA: Diagnosis not present

## 2017-01-13 DIAGNOSIS — Z01812 Encounter for preprocedural laboratory examination: Secondary | ICD-10-CM | POA: Insufficient documentation

## 2017-01-13 LAB — TYPE AND SCREEN
ABO/RH(D): A POS
ANTIBODY SCREEN: NEGATIVE

## 2017-01-13 LAB — CBC
HEMATOCRIT: 36.6 % (ref 36.0–46.0)
Hemoglobin: 12.3 g/dL (ref 12.0–15.0)
MCH: 30.3 pg (ref 26.0–34.0)
MCHC: 33.6 g/dL (ref 30.0–36.0)
MCV: 90.1 fL (ref 78.0–100.0)
PLATELETS: 219 10*3/uL (ref 150–400)
RBC: 4.06 MIL/uL (ref 3.87–5.11)
RDW: 13.4 % (ref 11.5–15.5)
WBC: 7.5 10*3/uL (ref 4.0–10.5)

## 2017-01-13 LAB — BASIC METABOLIC PANEL
ANION GAP: 8 (ref 5–15)
BUN: 9 mg/dL (ref 6–20)
CO2: 25 mmol/L (ref 22–32)
Calcium: 9 mg/dL (ref 8.9–10.3)
Chloride: 109 mmol/L (ref 101–111)
Creatinine, Ser: 0.99 mg/dL (ref 0.44–1.00)
Glucose, Bld: 62 mg/dL — ABNORMAL LOW (ref 65–99)
Potassium: 3.8 mmol/L (ref 3.5–5.1)
SODIUM: 142 mmol/L (ref 135–145)

## 2017-01-13 LAB — SURGICAL PCR SCREEN
MRSA, PCR: NEGATIVE
STAPHYLOCOCCUS AUREUS: NEGATIVE

## 2017-01-13 MED ORDER — CHLORHEXIDINE GLUCONATE CLOTH 2 % EX PADS: 6.0000 | MEDICATED_PAD | Freq: Once | CUTANEOUS | Status: DC

## 2017-01-13 NOTE — Progress Notes (Signed)
PCP - [facility] Cardiologist - Neoma Laming  EKG - 12/16/16 Stress Test - 09/23/15 ECHO - 09/30/15 Cardiac Cath - 12/29/16  Sleep Study - 09/2015 CPAP - has CPAP, will bring mask DOS   Patient denies shortness of breath, fever, cough and chest pain at PAT appointment   Patient verbalized understanding of instructions that were given to them at the PAT appointment. Patient was also instructed that they will need to review over the PAT instructions again at home before surgery.

## 2017-01-14 ENCOUNTER — Emergency Department: Payer: BLUE CROSS/BLUE SHIELD

## 2017-01-14 ENCOUNTER — Encounter: Payer: Self-pay | Admitting: Medical Oncology

## 2017-01-14 ENCOUNTER — Ambulatory Visit (INDEPENDENT_AMBULATORY_CARE_PROVIDER_SITE_OTHER): Payer: BLUE CROSS/BLUE SHIELD | Admitting: Family Medicine

## 2017-01-14 ENCOUNTER — Encounter: Payer: Self-pay | Admitting: Family Medicine

## 2017-01-14 ENCOUNTER — Other Ambulatory Visit: Payer: Self-pay

## 2017-01-14 ENCOUNTER — Emergency Department
Admission: EM | Admit: 2017-01-14 | Discharge: 2017-01-14 | Disposition: A | Discharge status: Home / Self Care | Payer: BLUE CROSS/BLUE SHIELD | Attending: Emergency Medicine | Admitting: Emergency Medicine

## 2017-01-14 VITALS — BP 160/91 | HR 66 | Temp 98.6°F | Wt 223.0 lb

## 2017-01-14 DIAGNOSIS — R0600 Dyspnea, unspecified: Secondary | ICD-10-CM | POA: Diagnosis not present

## 2017-01-14 DIAGNOSIS — I509 Heart failure, unspecified: Secondary | ICD-10-CM | POA: Insufficient documentation

## 2017-01-14 DIAGNOSIS — I110 Hypertensive heart disease with heart failure: Secondary | ICD-10-CM | POA: Insufficient documentation

## 2017-01-14 DIAGNOSIS — J45909 Unspecified asthma, uncomplicated: Secondary | ICD-10-CM | POA: Diagnosis not present

## 2017-01-14 DIAGNOSIS — F17200 Nicotine dependence, unspecified, uncomplicated: Secondary | ICD-10-CM | POA: Diagnosis not present

## 2017-01-14 DIAGNOSIS — J441 Chronic obstructive pulmonary disease with (acute) exacerbation: Secondary | ICD-10-CM | POA: Diagnosis not present

## 2017-01-14 DIAGNOSIS — R0602 Shortness of breath: Secondary | ICD-10-CM | POA: Diagnosis present

## 2017-01-14 DIAGNOSIS — Z79899 Other long term (current) drug therapy: Secondary | ICD-10-CM | POA: Diagnosis not present

## 2017-01-14 DIAGNOSIS — I10 Essential (primary) hypertension: Secondary | ICD-10-CM | POA: Diagnosis not present

## 2017-01-14 LAB — CBC
HEMATOCRIT: 38.5 % (ref 35.0–47.0)
Hemoglobin: 13.1 g/dL (ref 12.0–16.0)
MCH: 31 pg (ref 26.0–34.0)
MCHC: 34 g/dL (ref 32.0–36.0)
MCV: 91 fL (ref 80.0–100.0)
PLATELETS: 205 10*3/uL (ref 150–440)
RBC: 4.23 MIL/uL (ref 3.80–5.20)
RDW: 13.5 % (ref 11.5–14.5)
WBC: 6.2 10*3/uL (ref 3.6–11.0)

## 2017-01-14 LAB — BASIC METABOLIC PANEL
Anion gap: 9 (ref 5–15)
BUN: 12 mg/dL (ref 6–20)
CHLORIDE: 109 mmol/L (ref 101–111)
CO2: 24 mmol/L (ref 22–32)
CREATININE: 0.9 mg/dL (ref 0.44–1.00)
Calcium: 9.1 mg/dL (ref 8.9–10.3)
Glucose, Bld: 95 mg/dL (ref 65–99)
POTASSIUM: 4.1 mmol/L (ref 3.5–5.1)
SODIUM: 142 mmol/L (ref 135–145)

## 2017-01-14 LAB — TROPONIN I: Troponin I: <0.03 ng/mL (ref <0.03)

## 2017-01-14 MED ORDER — PREDNISONE 50 MG PO TABS: ORAL_TABLET | ORAL | 0 refills | Status: DC

## 2017-01-14 MED ORDER — DOXYCYCLINE HYCLATE 100 MG PO CAPS: 100.0000 mg | ORAL_CAPSULE | Freq: Two times a day (BID) | ORAL | 0 refills | Status: DC

## 2017-01-14 MED ORDER — ALBUTEROL SULFATE (2.5 MG/3ML) 0.083% IN NEBU
5.0000 mg | INHALATION_SOLUTION | Freq: Once | RESPIRATORY_TRACT | Status: AC
  Administered 2017-01-14: 5 mg via RESPIRATORY_TRACT
  Filled 2017-01-14: qty 6

## 2017-01-14 MED ORDER — PREDNISONE 20 MG PO TABS
60.0000 mg | ORAL_TABLET | Freq: Once | ORAL | Status: AC
  Administered 2017-01-14: 60 mg via ORAL
  Filled 2017-01-14: qty 3

## 2017-01-14 NOTE — ED Notes (Signed)
Meal tray given to patient.

## 2017-01-14 NOTE — ED Triage Notes (Signed)
Pt reports that she began last night having sob with cough and pain to shoulder blades. Pt also reports pain to center of chest that feels like a heaviness. Pt in NAD at this time.

## 2017-01-14 NOTE — ED Provider Notes (Signed)
Repeat troponin is negative, she is stable for outpatient follow-up.   Earleen Newport, MD 01/14/17 [phone]

## 2017-01-14 NOTE — ED Provider Notes (Signed)
[facility] Emergency Department Provider Note  ____________________________________________   First MD Initiated Contact with Patient 01/14/17 1219     (approximate)  I have reviewed the triage vital signs and the nursing notes.   HISTORY  Chief Complaint Shortness of Breath; Cough; and Chest Pain   HPI Hannah Kaiser is a 56 y.o. female with a history of anxiety, CHF and COPD who is presenting to the emergency department today with 1 day of shortness of breath as well as numbness and tingling that goes from the center of her chest to her left upper extremity. She says that she has had the numbness and tingling off and on for several weeks as well as low right-sided back pain which is also been ongoing over the past several weeks. She is denying any burning in her urine as well as any blood in her urine. She says that she had a recent cardiac catheterization that was done on July 24 which did not show any sensible lesions. She was doing this for a preop workup.   Past Medical History:  Diagnosis Date  . Anxiety   . Arthritis    knees, thoracic spurs and arthritis  . Asthma   . Breast pain   . CHF (congestive heart failure) ([place])   . Colon polyps   . COPD (chronic obstructive pulmonary disease) ([place])   . Depression   . Dysrhythmia   . GERD (gastroesophageal reflux disease)   . Glaucoma   . Heart murmur   . History of blood transfusion   . Hypertension   . Motion sickness    cars  . Multilevel degenerative disc disease   . Neuropathy    Bilateral arms and legs  . Nipple discharge   . Numbness of both lower extremities    bulging lumbar discs - per pt  . Numbness of upper extremity    bilateral - degenerative cervical discs - per pt  . Pneumonia   . PONV (postoperative nausea and vomiting)   . Sleep apnea    has CPAP  . Smokers' cough ([place])   . Wears dentures    full upper and lower    Patient Active Problem List   Diagnosis Date Noted    . Personal history of colonic polyps   . Polyp of sigmoid colon   . Problems with swallowing and mastication   . Gastritis without bleeding   . Hyperlipidemia 09/28/2016  . Bladder prolapse, female, acquired 02/24/2016  . Arthritis of both knees 01/14/2016  . Herpes simplex infection 12/03/2015  . Depression 12/03/2015  . Controlled substance agreement signed 12/03/2015  . GERD (gastroesophageal reflux disease) 07/29/2015  . Primary osteoarthritis of both knees 04/12/2015  . COPD (chronic obstructive pulmonary disease) ([place]) 03/26/2015  . Benign hypertensive renal disease 03/13/2015  . Dark urine 03/13/2015    Past Surgical History:  Procedure Laterality Date  . ABDOMINAL HYSTERECTOMY  2014  . BACK SURGERY    . BELPHAROPTOSIS REPAIR    . BROW LIFT Bilateral 03/24/2016   Procedure: BLEPHAROPLASTY;  Surgeon: Karle Starch, MD;  Location: [place];  Service: Ophthalmology;  Laterality: Bilateral;  sleep apnea  . CARDIAC CATHETERIZATION    . COLONOSCOPY WITH PROPOFOL N/A 10/19/2016   Procedure: COLONOSCOPY WITH PROPOFOL;  Surgeon: Lucilla Lame, MD;  Location: [place];  Service: Endoscopy;  Laterality: N/A;  . DILATION AND CURETTAGE OF UTERUS    . ECTOPIC PREGNANCY SURGERY     x2  .  ESOPHAGOGASTRODUODENOSCOPY (EGD) WITH PROPOFOL N/A 10/19/2016   Procedure: ESOPHAGOGASTRODUODENOSCOPY (EGD) WITH PROPOFOL;  Surgeon: Lucilla Lame, MD;  Location: [place];  Service: Endoscopy;  Laterality: N/A;  . LEFT HEART CATH AND CORONARY ANGIOGRAPHY Left 12/29/2016   Procedure: Left Heart Cath and Coronary Angiography;  Surgeon: Dionisio David, MD;  Location: [place] [facility];  Service: Cardiovascular;  Laterality: Left;  . NECK SURGERY  2003  . POLYPECTOMY  10/19/2016   Procedure: POLYPECTOMY;  Surgeon: Lucilla Lame, MD;  Location: [place];  Service: Endoscopy;;  . [place]  . TUBAL LIGATION      Prior to Admission medications    Medication Sig Start Date End Date Taking? Authorizing Provider  albuterol (PROVENTIL HFA;VENTOLIN HFA) 108 (90 BASE) MCG/ACT inhaler Inhale 2 puffs into the lungs every 6 (six) hours as needed for wheezing or shortness of breath.   Yes [provider]  amLODipine (NORVASC) 5 MG tablet Take 1 tablet (5 mg total) by mouth daily. 09/22/16  Yes Johnson, Megan P, DO  atenolol (TENORMIN) 25 MG tablet Take 1 tablet (25 mg total) by mouth daily. 09/22/16  Yes Johnson, Megan P, DO  atorvastatin (LIPITOR) 40 MG tablet Take 40 mg by mouth daily. 12/31/16  Yes [provider]  clonazePAM (KLONOPIN) 0.5 MG tablet Take 1 tablet (0.5 mg total) by mouth daily as needed for anxiety. 01/05/17  Yes Johnson, Megan P, DO  FLUoxetine (PROZAC) 40 MG capsule Take 80 mg by mouth daily.   Yes [provider]  furosemide (LASIX) 20 MG tablet take 1 tablet by mouth daily if needed for SWELLING Patient taking differently: Take 20 mg by mouth daily.  09/22/16  Yes Johnson, Megan P, DO  hydrOXYzine (ATARAX/VISTARIL) 25 MG tablet Take 1 tablet (25 mg total) by mouth 3 (three) times daily as needed. Patient taking differently: Take 25 mg by mouth 2 (two) times daily.  11/23/16  Yes Johnson, Megan P, DO  latanoprost (XALATAN) 0.005 % ophthalmic solution Place 1 drop into both eyes at bedtime.   Yes [provider]  losartan (COZAAR) 100 MG tablet Take 1 tablet (100 mg total) by mouth daily. 09/22/16  Yes Johnson, Megan P, DO  nystatin-triamcinolone ointment (MYCOLOG) Apply 1 application topically 2 (two) times daily. Patient taking differently: Apply 1 application topically daily as needed (itching).  11/25/15  Yes Johnson, Megan P, DO  pantoprazole (PROTONIX) 40 MG tablet Take 40 mg by mouth daily.  12/31/16  Yes [provider]  pregabalin (LYRICA) 100 MG capsule Take 1 capsule (100 mg total) by mouth 3 (three) times daily. Patient taking differently: Take 300 mg by mouth daily.  09/22/16  Yes  Johnson, Megan P, DO  tiZANidine (ZANAFLEX) 4 MG tablet Take 1 tablet (4 mg total) by mouth at bedtime. 11/23/16  Yes Johnson, Megan P, DO  traMADol (ULTRAM) 50 MG tablet Take 1 tablet (50 mg total) by mouth every 6 (six) hours as needed for moderate pain. 01/12/17  Yes Johnson, Megan P, DO  valACYclovir (VALTREX) 1000 MG tablet Take 1 tablet (1,000 mg total) by mouth daily. 09/22/16  Yes Johnson, Megan P, DO  ezetimibe (ZETIA) 10 MG tablet Take 1 tablet (10 mg total) by mouth daily. Patient not taking: Reported on 01/14/2017 09/28/16   Park Liter P, DO  omeprazole (PRILOSEC) 40 MG capsule Take 1 capsule (40 mg total) by mouth daily. Patient not taking: Reported on 01/12/2017 09/22/16   Valerie Roys, DO  Allergies Abilify [aripiprazole]; Ace inhibitors; Hct [hydrochlorothiazide]; Wellbutrin [bupropion]; Azithromycin; and Erythromycin  Family History  Problem Relation Age of Onset  . Cancer Brother        melanoma  . Cancer Maternal Grandfather        prostate  . Cancer Maternal Uncle        lung    Social History Social History  Substance Use Topics  . Smoking status: Current Every Day Smoker    Packs/day: 1.50    Years: 41.00  . Smokeless tobacco: Never Used     Comment: since age 39  . Alcohol use No    Review of Systems  Constitutional: No fever/chills Eyes: No visual changes. ENT: No sore throat. Cardiovascular: as above Respiratory: as above Gastrointestinal: No abdominal pain.  No nausea, no vomiting.  No diarrhea.  No constipation. Genitourinary: Negative for dysuria. Musculoskeletal: Negative for back pain. Skin: Negative for rash. Neurological: Negative for headaches, focal weakness or numbness.   ____________________________________________   PHYSICAL EXAM:  VITAL SIGNS: ED Triage Vitals  Enc Vitals Group     BP 01/14/17 1230 138/85     Pulse Rate 01/14/17 1230 (!) 51     Resp 01/14/17 1230 14     Temp --      Temp src --      SpO2 01/14/17 1230  98 %     Weight 01/14/17 1057 223 lb (101.2 kg)     Height 01/14/17 1057 5\' 4"  (1.626 m)     Head Circumference --      Peak Flow --      Pain Score 01/14/17 1057 7     Pain Loc --      Pain Edu? --      Excl. in [place]? --     Constitutional: Alert and oriented. Well appearing and in no acute distress. Eyes: Conjunctivae are normal.  Head: Atraumatic. Nose: No congestion/rhinnorhea. Mouth/Throat: Mucous membranes are moist.  Neck: No stridor.   Cardiovascular: Normal rate, regular rhythm. Grossly normal heart sounds.  Good peripheral circulation with equal bilateral radial as well as dorsalis pedis pulses. Respiratory: Normal respiratory effort.  No retractions. Mildly prolonged expiratory phase with end expiratory wheezes throughout all fields. Gastrointestinal: Soft and nontender. No distention.  Musculoskeletal: No lower extremity tenderness nor edema.  No joint effusions. Mild tenderness to palpation to the right lateral lumbar region which the patient says ongoing over the past several weeks and unchanged. Neurologic:  Normal speech and language. No gross focal neurologic deficits are appreciated. Skin:  Skin is warm, dry and intact. No rash noted. Psychiatric: Mood and affect are normal. Speech and behavior are normal.  ____________________________________________   LABS (all labs ordered are listed, but only abnormal results are displayed)  Labs Reviewed  BASIC METABOLIC PANEL  CBC  TROPONIN I  TROPONIN I   ____________________________________________  EKG  ED ECG REPORT I, [place],  Youlanda Roys, the attending physician, personally viewed and interpreted this ECG.   Date: 01/14/2017  EKG Time: 10:57 AM  Rate: 55  Rhythm: normal sinus rhythm  Axis: Normal  Intervals:none  ST&T Change: No ST segment elevation or depression. Single T-wave inversion in V2.  ____________________________________________  RADIOLOGY  No active cardiopulmonary  disease ____________________________________________   PROCEDURES  Procedure(s) performed: None  Procedures  Critical Care performed: No  ____________________________________________   INITIAL IMPRESSION / ASSESSMENT AND PLAN / ED COURSE  Pertinent labs & imaging results that were available during my care of the  patient were reviewed by me and considered in my medical decision making (see chart for details).      ----------------------------------------- 3:15 PM on 01/14/2017 -----------------------------------------  Patient is pending her second troponin at this time. Signed out to Dr. Jimmye Norman. Symptoms appear mostly chronic. Very reassuring recent cardiac catheterization without any significant stenosis. Unlikely to be PE. Denies chest pain. No pleuritic pain. No tachycardia or hypoxia.  ____________________________________________   FINAL CLINICAL IMPRESSION(S) / ED DIAGNOSES  COPD. Shortness of breath.    NEW MEDICATIONS STARTED DURING THIS VISIT:  New Prescriptions   No medications on file     Note:  This document was prepared using Dragon voice recognition software and may include unintentional dictation errors.     Orbie Pyo, MD 01/14/17 [phone]

## 2017-01-14 NOTE — Progress Notes (Signed)
   BP (!) 160/91   Pulse 66   Temp 98.6 F (37 C)   Wt 223 lb (101.2 kg)   SpO2 99%   BMI 38.28 kg/m    Subjective:    Patient ID: Hannah Kaiser, female    DOB: 09/30/1960, 56 y.o.   MRN: [id number]  HPI: Hannah Kaiser is a 56 y.o. female  Chief Complaint  Patient presents with  . Shortness of Breath    started this morning, hurts to take a deep breath.   Patient presents with several hours of left sided chest pain and pressure, left arm pain radiating up to jaw, and SOB. Denies syncope, palpitations, diaphoresis. Is s/p heart catheterization in the past few weeks for a recent MI. Awaiting back surgery next week and concerned something may be going on again. Pt is a long time smoker with known CAD, CHF, HTN, hyperlipidemia.   Relevant past medical, surgical, family and social history reviewed and updated as indicated. Interim medical history since our last visit reviewed. Allergies and medications reviewed and updated.  Review of Systems  Constitutional: Negative.   Respiratory: Positive for chest tightness and shortness of breath.   Cardiovascular: Positive for chest pain.  Gastrointestinal: Negative.   Genitourinary: Negative.   Musculoskeletal: Positive for arthralgias (left arm pain).  Neurological: Negative.   Psychiatric/Behavioral: Negative.    Per HPI unless specifically indicated above     Objective:    BP (!) 160/91   Pulse 66   Temp 98.6 F (37 C)   Wt 223 lb (101.2 kg)   SpO2 99%   BMI 38.28 kg/m   Wt Readings from Last 3 Encounters:  01/14/17 223 lb (101.2 kg)  01/14/17 223 lb (101.2 kg)  01/13/17 222 lb 10.6 oz (101 kg)    Physical Exam  Constitutional: She is oriented to person, place, and time. She appears well-developed and well-nourished.  Pt very anxious appearing, not her usual demeanor  HENT:  Head: Atraumatic.  Eyes: Pupils are equal, round, and reactive to light. Conjunctivae are normal. No scleral icterus.  Neck: Normal range of motion.  Neck supple.  Cardiovascular: Normal rate and normal heart sounds.   Pulmonary/Chest: Effort normal and breath sounds normal. No respiratory distress.  Musculoskeletal: Normal range of motion.  Neurological: She is alert and oriented to person, place, and time.  Skin: Skin is warm and dry.  Psychiatric: Judgment and thought content normal.  Appears reserved, anxious  Nursing note and vitals reviewed.     Assessment & Plan:   Problem List Items Addressed This Visit    None    Visit Diagnoses    SOB (shortness of breath)    -  Primary   EKG today unchanged from previous, but given significant risk factors, suspicious sxs, and upcoming surgery, recommended pt go to ER for further eval   Relevant Orders   EKG 12-Lead (Completed)    Pt agreeable to plan and will drive there now for further evaluation. [facility] called and informed of pt's situation.    Follow up plan: Return for ER F/u.

## 2017-01-14 NOTE — ED Notes (Signed)
ED Provider at bedside. 

## 2017-01-14 NOTE — Progress Notes (Addendum)
Anesthesia Chart Review:  Pt is a 56 year old female scheduled for L4-5 PLIF on 01/19/17 with Earnie Larsson, M.D.  Surgery was originally scheduled for 12/29/2016 but was postponed so pt could undergo syncopal work up.   -- Pt saw neurologist Jennings Books, MD on 12/24/16 for syncope.  His note indicates pt had 4th syncopal episode in 05/2016, but none since.  He felt EEG no longer warranted given no new syncope since 05/2016, but suggested medical and cardiac evaluation of syncope.    -- Saw cardiologist Neoma Laming, MD 12/28/16. From his note, it seems a stress test was performed that was equivocal.  Pt went on to have cardiac cath 12/29/16 that showed mild non-obstructive CAD. Dr. Humphrey Rolls cleared pt for surgery.   - PCP is Park Liter, DO - Cardiologist is Neoma Laming, MD - Neurologist is Jennings Books, MD (notes in care everywhere)   PMH includes: CHF, HTN, heart murmur, asthma, COPD, OSA, glaucoma, post-op N/V, GERD. Current smoker. BMI 38.  - ED visit 01/14/17 for chest pain.  EKG stable, troponin negative x2. Recent cath without significant stenosis. Thought to be chronic problem, not cardiac.   Medications include: Albuterol, amlodipine, atenolol, lipitor, Lasix, losartan, Protonix   BP 120/69   Pulse 66   Temp 37.1 C (Oral)   Resp 16   Ht 5\' 4"  (1.626 m)   Wt 222 lb 10.6 oz (101 kg)   SpO2 100%   BMI 38.22 kg/m    Preoperative labs reviewed.  CXR 01/14/17: No active cardiopulmonary disease.  EKG 01/14/17: Sinus  Bradycardia (55 bpm). Poor R-wave progression -may be secondary to pulmonary disease   consider old anterior infarct. Low voltage -possible pulmonary disease.   Cardiac cath 12/29/16 (done for equivocal stress test):   Mid RCA lesion, 40 %stenosed.  Prox Cx to Mid Cx lesion, 40 %stenosed. - Non-Obstructive CAD with severe calcification in proximal to mid LAD with mild disease. Normal LVEF, treat medically. May have surgery.  Carotid duplex 12/25/16 (Dr. Laurelyn Sickle  office): - Mild disease in mid ICA bilaterally with antegrade flow in vertebrals.  Echo 12/25/16 (Dr. Theda Sers office): 1. Technically difficult study due to body habitus. 2. Mildly dilated LA with LV, RA and RV, and aorta normal appearing in size. 3. Normal LV systolic function and diastolic function. Normal wall motion. Mild LVH. 4. Trace to mild pulmonary regurgitation. 5. Mild tricuspid regurgitation. 6. Normal pulmonary artery pressure. 7. Mild mitral regurgitation. 8. Trivial anterior pericardial effusion without hemodynamic compromise or tamponade  If no changes, I anticipate pt can proceed with surgery as scheduled.   Willeen Cass, FNP-BC [facility] Short Stay Surgical Center/Anesthesiology Phone: [phone] 01/15/2017 11:00 AM

## 2017-01-16 NOTE — Patient Instructions (Signed)
Follow up as needed

## 2017-01-18 NOTE — Anesthesia Preprocedure Evaluation (Addendum)
Anesthesia Evaluation  Patient identified by MRN, date of birth, ID band Patient awake    Reviewed: Allergy & Precautions, NPO status , Patient's Chart, lab work & pertinent test results  Airway Mallampati: III  TM Distance: <3 FB Neck ROM: Full    Dental  (+) Dental Advisory Given   Pulmonary asthma , sleep apnea , COPD, Current Smoker,    breath sounds clear to auscultation       Cardiovascular hypertension, Pt. on medications and Pt. on home beta blockers +CHF  + dysrhythmias  Rhythm:Regular Rate:Normal  Cardiac cath 12/29/16 (done for equivocal stress test):   Mid RCA lesion, 40 %stenosed.  Prox Cx to Mid Cx lesion, 40 %stenosed. - Non-Obstructive CAD with severe calcification in proximal to mid LAD with mild disease. Normal LVEF. Valves okay on echo   Neuro/Psych Anxiety Depression negative neurological ROS     GI/Hepatic Neg liver ROS, GERD  Medicated,  Endo/Other  negative endocrine ROS  Renal/GU negative Renal ROS     Musculoskeletal  (+) Arthritis ,   Abdominal   Peds  Hematology negative hematology ROS (+)   Anesthesia Other Findings   Reproductive/Obstetrics                            Lab Results  Component Value Date   WBC 6.2 01/14/2017   HGB 13.1 01/14/2017   HCT 38.5 01/14/2017   MCV 91.0 01/14/2017   PLT 205 01/14/2017   Lab Results  Component Value Date   CREATININE 0.90 01/14/2017   BUN 12 01/14/2017   NA 142 01/14/2017   K 4.1 01/14/2017   CL 109 01/14/2017   CO2 24 01/14/2017   No results found for: INR, PROTIME  Anesthesia Physical Anesthesia Plan  ASA: III  Anesthesia Plan: General   Post-op Pain Management:    Induction: Intravenous  PONV Risk Score and Plan: 2 and Ondansetron, Dexamethasone and Treatment may vary due to age or medical condition  Airway Management Planned: Oral ETT  Additional Equipment:   Intra-op Plan:    Post-operative Plan: Extubation in OR  Informed Consent: I have reviewed the patients History and Physical, chart, labs and discussed the procedure including the risks, benefits and alternatives for the proposed anesthesia with the patient or authorized representative who has indicated his/her understanding and acceptance.   Dental advisory given  Plan Discussed with: CRNA  Anesthesia Plan Comments:        Anesthesia Quick Evaluation

## 2017-01-19 ENCOUNTER — Inpatient Hospital Stay (HOSPITAL_COMMUNITY)
Admission: RE | Admit: 2017-01-19 | Discharge: 2017-01-21 | DRG: 454 | Disposition: A | Discharge status: Home / Self Care | Payer: BLUE CROSS/BLUE SHIELD | Source: Ambulatory Visit | Attending: Neurosurgery | Admitting: Neurosurgery

## 2017-01-19 ENCOUNTER — Inpatient Hospital Stay (HOSPITAL_COMMUNITY): Payer: BLUE CROSS/BLUE SHIELD | Admitting: Emergency Medicine

## 2017-01-19 ENCOUNTER — Encounter (HOSPITAL_COMMUNITY): Payer: Self-pay | Admitting: *Deleted

## 2017-01-19 ENCOUNTER — Inpatient Hospital Stay (HOSPITAL_COMMUNITY): Payer: BLUE CROSS/BLUE SHIELD | Admitting: Anesthesiology

## 2017-01-19 ENCOUNTER — Encounter (HOSPITAL_COMMUNITY)
Admission: RE | Disposition: A | Discharge status: Home / Self Care | Payer: Self-pay | Source: Ambulatory Visit | Attending: Neurosurgery

## 2017-01-19 ENCOUNTER — Inpatient Hospital Stay (HOSPITAL_COMMUNITY): Payer: BLUE CROSS/BLUE SHIELD

## 2017-01-19 DIAGNOSIS — F419 Anxiety disorder, unspecified: Secondary | ICD-10-CM | POA: Diagnosis present

## 2017-01-19 DIAGNOSIS — Z981 Arthrodesis status: Secondary | ICD-10-CM | POA: Diagnosis not present

## 2017-01-19 DIAGNOSIS — Z888 Allergy status to other drugs, medicaments and biological substances status: Secondary | ICD-10-CM

## 2017-01-19 DIAGNOSIS — M48062 Spinal stenosis, lumbar region with neurogenic claudication: Secondary | ICD-10-CM | POA: Diagnosis present

## 2017-01-19 DIAGNOSIS — I10 Essential (primary) hypertension: Secondary | ICD-10-CM | POA: Diagnosis present

## 2017-01-19 DIAGNOSIS — F17210 Nicotine dependence, cigarettes, uncomplicated: Secondary | ICD-10-CM | POA: Diagnosis present

## 2017-01-19 DIAGNOSIS — F329 Major depressive disorder, single episode, unspecified: Secondary | ICD-10-CM | POA: Diagnosis present

## 2017-01-19 DIAGNOSIS — J449 Chronic obstructive pulmonary disease, unspecified: Secondary | ICD-10-CM | POA: Diagnosis present

## 2017-01-19 DIAGNOSIS — Y792 Prosthetic and other implants, materials and accessory orthopedic devices associated with adverse incidents: Secondary | ICD-10-CM | POA: Diagnosis present

## 2017-01-19 DIAGNOSIS — M4316 Spondylolisthesis, lumbar region: Secondary | ICD-10-CM | POA: Diagnosis present

## 2017-01-19 DIAGNOSIS — G629 Polyneuropathy, unspecified: Secondary | ICD-10-CM | POA: Diagnosis present

## 2017-01-19 DIAGNOSIS — M170 Bilateral primary osteoarthritis of knee: Secondary | ICD-10-CM | POA: Diagnosis present

## 2017-01-19 DIAGNOSIS — Z881 Allergy status to other antibiotic agents status: Secondary | ICD-10-CM

## 2017-01-19 DIAGNOSIS — K219 Gastro-esophageal reflux disease without esophagitis: Secondary | ICD-10-CM | POA: Diagnosis present

## 2017-01-19 DIAGNOSIS — M545 Low back pain: Secondary | ICD-10-CM | POA: Diagnosis present

## 2017-01-19 DIAGNOSIS — G4730 Sleep apnea, unspecified: Secondary | ICD-10-CM | POA: Diagnosis present

## 2017-01-19 DIAGNOSIS — T84216A Breakdown (mechanical) of internal fixation device of vertebrae, initial encounter: Secondary | ICD-10-CM | POA: Diagnosis present

## 2017-01-19 DIAGNOSIS — Z7952 Long term (current) use of systemic steroids: Secondary | ICD-10-CM | POA: Diagnosis not present

## 2017-01-19 DIAGNOSIS — Z419 Encounter for procedure for purposes other than remedying health state, unspecified: Secondary | ICD-10-CM

## 2017-01-19 DIAGNOSIS — H409 Unspecified glaucoma: Secondary | ICD-10-CM | POA: Diagnosis present

## 2017-01-19 DIAGNOSIS — M4310 Spondylolisthesis, site unspecified: Secondary | ICD-10-CM | POA: Diagnosis present

## 2017-01-19 SURGERY — POSTERIOR LUMBAR FUSION 1 WITH HARDWARE REMOVAL
Anesthesia: General | Site: Back

## 2017-01-19 MED ORDER — THROMBIN 20000 UNITS EX SOLR
CUTANEOUS | Status: DC | PRN
  Administered 2017-01-19 (×2): via TOPICAL

## 2017-01-19 MED ORDER — PHENOL 1.4 % MT LIQD: 1.0000 | OROMUCOSAL | Status: DC | PRN

## 2017-01-19 MED ORDER — ONDANSETRON HCL 4 MG PO TABS: 4.0000 mg | ORAL_TABLET | Freq: Four times a day (QID) | ORAL | Status: DC | PRN

## 2017-01-19 MED ORDER — VALACYCLOVIR HCL 500 MG PO TABS
1000.0000 mg | ORAL_TABLET | Freq: Every day | ORAL | Status: DC
  Administered 2017-01-20 – 2017-01-21 (×2): 1000 mg via ORAL
  Filled 2017-01-19 (×2): qty 2

## 2017-01-19 MED ORDER — ATORVASTATIN CALCIUM 20 MG PO TABS
40.0000 mg | ORAL_TABLET | Freq: Every day | ORAL | Status: DC
  Administered 2017-01-20 – 2017-01-21 (×2): 40 mg via ORAL
  Filled 2017-01-19 (×2): qty 2

## 2017-01-19 MED ORDER — TRAMADOL HCL 50 MG PO TABS: 50.0000 mg | ORAL_TABLET | Freq: Four times a day (QID) | ORAL | Status: DC | PRN

## 2017-01-19 MED ORDER — SUCCINYLCHOLINE CHLORIDE 20 MG/ML IJ SOLN
INTRAMUSCULAR | Status: DC | PRN
  Administered 2017-01-19: 120 mg via INTRAVENOUS

## 2017-01-19 MED ORDER — VANCOMYCIN HCL 1000 MG IV SOLR
INTRAVENOUS | Status: AC
  Filled 2017-01-19: qty 1000

## 2017-01-19 MED ORDER — SUGAMMADEX SODIUM 200 MG/2ML IV SOLN
INTRAVENOUS | Status: AC
  Filled 2017-01-19: qty 4

## 2017-01-19 MED ORDER — CEFAZOLIN SODIUM-DEXTROSE 1-4 GM/50ML-% IV SOLN
1.0000 g | Freq: Three times a day (TID) | INTRAVENOUS | Status: AC
  Administered 2017-01-19 – 2017-01-20 (×2): 1 g via INTRAVENOUS
  Filled 2017-01-19 (×2): qty 50

## 2017-01-19 MED ORDER — THROMBIN 20000 UNITS EX SOLR
CUTANEOUS | Status: AC
  Filled 2017-01-19: qty 20000

## 2017-01-19 MED ORDER — EPHEDRINE SULFATE 50 MG/ML IJ SOLN
INTRAMUSCULAR | Status: DC | PRN
  Administered 2017-01-19: 10 mg via INTRAVENOUS

## 2017-01-19 MED ORDER — AMLODIPINE BESYLATE 5 MG PO TABS
5.0000 mg | ORAL_TABLET | Freq: Every day | ORAL | Status: DC
  Administered 2017-01-20 – 2017-01-21 (×2): 5 mg via ORAL
  Filled 2017-01-19 (×2): qty 1

## 2017-01-19 MED ORDER — FENTANYL CITRATE (PF) 250 MCG/5ML IJ SOLN
INTRAMUSCULAR | Status: AC
  Filled 2017-01-19: qty 5

## 2017-01-19 MED ORDER — DOXYCYCLINE HYCLATE 100 MG PO TABS
100.0000 mg | ORAL_TABLET | Freq: Two times a day (BID) | ORAL | Status: DC
  Administered 2017-01-19 – 2017-01-21 (×5): 100 mg via ORAL
  Filled 2017-01-19 (×5): qty 1

## 2017-01-19 MED ORDER — HYDROMORPHONE HCL 1 MG/ML IJ SOLN
0.5000 mg | INTRAMUSCULAR | Status: DC | PRN
  Administered 2017-01-19: 1 mg via INTRAVENOUS
  Filled 2017-01-19: qty 1

## 2017-01-19 MED ORDER — ROCURONIUM BROMIDE 100 MG/10ML IV SOLN
INTRAVENOUS | Status: DC | PRN
  Administered 2017-01-19: 20 mg via INTRAVENOUS
  Administered 2017-01-19: 40 mg via INTRAVENOUS
  Administered 2017-01-19: 20 mg via INTRAVENOUS
  Administered 2017-01-19: 40 mg via INTRAVENOUS

## 2017-01-19 MED ORDER — CLONAZEPAM 0.5 MG PO TABS: 0.5000 mg | ORAL_TABLET | Freq: Every day | ORAL | Status: DC | PRN

## 2017-01-19 MED ORDER — PHENYLEPHRINE 40 MCG/ML (10ML) SYRINGE FOR IV PUSH (FOR BLOOD PRESSURE SUPPORT)
PREFILLED_SYRINGE | INTRAVENOUS | Status: AC
  Filled 2017-01-19: qty 10

## 2017-01-19 MED ORDER — HYDROMORPHONE HCL 1 MG/ML IJ SOLN
0.2500 mg | INTRAMUSCULAR | Status: DC | PRN
  Administered 2017-01-19 (×2): 0.5 mg via INTRAVENOUS

## 2017-01-19 MED ORDER — DEXAMETHASONE SODIUM PHOSPHATE 10 MG/ML IJ SOLN
10.0000 mg | INTRAMUSCULAR | Status: AC
  Administered 2017-01-19: 10 mg via INTRAVENOUS
  Filled 2017-01-19: qty 1

## 2017-01-19 MED ORDER — LIDOCAINE HCL (CARDIAC) 20 MG/ML IV SOLN
INTRAVENOUS | Status: DC | PRN
  Administered 2017-01-19: 40 mg via INTRAVENOUS

## 2017-01-19 MED ORDER — PREDNISONE 50 MG PO TABS
50.0000 mg | ORAL_TABLET | Freq: Every day | ORAL | Status: DC
  Administered 2017-01-20: 50 mg via ORAL
  Filled 2017-01-19 (×2): qty 1

## 2017-01-19 MED ORDER — CEFAZOLIN SODIUM-DEXTROSE 2-4 GM/100ML-% IV SOLN
2.0000 g | INTRAVENOUS | Status: AC
  Administered 2017-01-19: 2 g via INTRAVENOUS
  Filled 2017-01-19: qty 100

## 2017-01-19 MED ORDER — SODIUM CHLORIDE 0.9% FLUSH
3.0000 mL | Freq: Two times a day (BID) | INTRAVENOUS | Status: DC
  Administered 2017-01-19: 3 mL via INTRAVENOUS

## 2017-01-19 MED ORDER — SODIUM CHLORIDE 0.9% FLUSH: 3.0000 mL | INTRAVENOUS | Status: DC | PRN

## 2017-01-19 MED ORDER — FUROSEMIDE 20 MG PO TABS
20.0000 mg | ORAL_TABLET | Freq: Every day | ORAL | Status: DC
  Administered 2017-01-19 – 2017-01-21 (×3): 20 mg via ORAL
  Filled 2017-01-19 (×3): qty 1

## 2017-01-19 MED ORDER — SUGAMMADEX SODIUM 200 MG/2ML IV SOLN
INTRAVENOUS | Status: DC | PRN
  Administered 2017-01-19: 300 mg via INTRAVENOUS

## 2017-01-19 MED ORDER — ATENOLOL 25 MG PO TABS
25.0000 mg | ORAL_TABLET | Freq: Every day | ORAL | Status: DC
  Administered 2017-01-21: 25 mg via ORAL
  Filled 2017-01-19 (×2): qty 1

## 2017-01-19 MED ORDER — HYDROXYZINE HCL 25 MG PO TABS
25.0000 mg | ORAL_TABLET | Freq: Two times a day (BID) | ORAL | Status: DC
  Administered 2017-01-19 – 2017-01-21 (×5): 25 mg via ORAL
  Filled 2017-01-19 (×5): qty 1

## 2017-01-19 MED ORDER — ONDANSETRON HCL 4 MG/2ML IJ SOLN: 4.0000 mg | Freq: Four times a day (QID) | INTRAMUSCULAR | Status: DC | PRN

## 2017-01-19 MED ORDER — PROMETHAZINE HCL 25 MG/ML IJ SOLN: 6.2500 mg | INTRAMUSCULAR | Status: DC | PRN

## 2017-01-19 MED ORDER — MIDAZOLAM HCL 2 MG/2ML IJ SOLN
INTRAMUSCULAR | Status: AC
  Filled 2017-01-19: qty 2

## 2017-01-19 MED ORDER — PHENYLEPHRINE HCL 10 MG/ML IJ SOLN
INTRAVENOUS | Status: DC | PRN
  Administered 2017-01-19: 10 ug/min via INTRAVENOUS

## 2017-01-19 MED ORDER — SODIUM CHLORIDE 0.9 % IV SOLN
250.0000 mL | INTRAVENOUS | Status: DC
  Administered 2017-01-19: 250 mL via INTRAVENOUS

## 2017-01-19 MED ORDER — FENTANYL CITRATE (PF) 100 MCG/2ML IJ SOLN
INTRAMUSCULAR | Status: DC | PRN
  Administered 2017-01-19: 25 ug via INTRAVENOUS
  Administered 2017-01-19 (×4): 50 ug via INTRAVENOUS
  Administered 2017-01-19: 100 ug via INTRAVENOUS
  Administered 2017-01-19: 25 ug via INTRAVENOUS
  Administered 2017-01-19: 50 ug via INTRAVENOUS

## 2017-01-19 MED ORDER — EPHEDRINE 5 MG/ML INJ
INTRAVENOUS | Status: AC
  Filled 2017-01-19: qty 10

## 2017-01-19 MED ORDER — HYDROCODONE-ACETAMINOPHEN 10-325 MG PO TABS
1.0000 | ORAL_TABLET | ORAL | Status: DC | PRN
  Administered 2017-01-19 – 2017-01-21 (×9): 2 via ORAL
  Filled 2017-01-19 (×9): qty 2

## 2017-01-19 MED ORDER — PANTOPRAZOLE SODIUM 40 MG PO TBEC
40.0000 mg | DELAYED_RELEASE_TABLET | Freq: Every day | ORAL | Status: DC
  Administered 2017-01-20 – 2017-01-21 (×2): 40 mg via ORAL
  Filled 2017-01-19 (×2): qty 1

## 2017-01-19 MED ORDER — SUCCINYLCHOLINE CHLORIDE 200 MG/10ML IV SOSY
PREFILLED_SYRINGE | INTRAVENOUS | Status: AC
  Filled 2017-01-19: qty 10

## 2017-01-19 MED ORDER — ONDANSETRON HCL 4 MG/2ML IJ SOLN
INTRAMUSCULAR | Status: DC | PRN
  Administered 2017-01-19: 4 mg via INTRAVENOUS

## 2017-01-19 MED ORDER — BUPIVACAINE HCL (PF) 0.25 % IJ SOLN
INTRAMUSCULAR | Status: DC | PRN
  Administered 2017-01-19: 30 mL

## 2017-01-19 MED ORDER — SODIUM CHLORIDE 0.9 % IR SOLN
Status: DC | PRN
  Administered 2017-01-19: 09:00:00

## 2017-01-19 MED ORDER — GLYCOPYRROLATE 0.2 MG/ML IJ SOLN
INTRAMUSCULAR | Status: DC | PRN
  Administered 2017-01-19: 0.2 mg via INTRAVENOUS
  Administered 2017-01-19 (×2): 0.1 mg via INTRAVENOUS

## 2017-01-19 MED ORDER — MIDAZOLAM HCL 5 MG/5ML IJ SOLN
INTRAMUSCULAR | Status: DC | PRN
  Administered 2017-01-19: 2 mg via INTRAVENOUS

## 2017-01-19 MED ORDER — MENTHOL 3 MG MT LOZG: 1.0000 | LOZENGE | OROMUCOSAL | Status: DC | PRN

## 2017-01-19 MED ORDER — HYDROMORPHONE HCL 1 MG/ML IJ SOLN
INTRAMUSCULAR | Status: AC
  Filled 2017-01-19: qty 1

## 2017-01-19 MED ORDER — NYSTATIN-TRIAMCINOLONE 100000-0.1 UNIT/GM-% EX OINT
1.0000 "application " | TOPICAL_OINTMENT | Freq: Every day | CUTANEOUS | Status: DC | PRN
  Filled 2017-01-19: qty 15

## 2017-01-19 MED ORDER — LOSARTAN POTASSIUM 50 MG PO TABS
100.0000 mg | ORAL_TABLET | Freq: Every day | ORAL | Status: DC
  Administered 2017-01-20 – 2017-01-21 (×2): 100 mg via ORAL
  Filled 2017-01-19 (×2): qty 2

## 2017-01-19 MED ORDER — ALBUTEROL SULFATE (2.5 MG/3ML) 0.083% IN NEBU: 3.0000 mL | INHALATION_SOLUTION | Freq: Four times a day (QID) | RESPIRATORY_TRACT | Status: DC | PRN

## 2017-01-19 MED ORDER — VANCOMYCIN HCL 1000 MG IV SOLR
INTRAVENOUS | Status: DC | PRN
  Administered 2017-01-19: 1000 mg via TOPICAL

## 2017-01-19 MED ORDER — LACTATED RINGERS IV SOLN
INTRAVENOUS | Status: DC
  Administered 2017-01-19 (×2): via INTRAVENOUS

## 2017-01-19 MED ORDER — PROPOFOL 10 MG/ML IV BOLUS
INTRAVENOUS | Status: DC | PRN
  Administered 2017-01-19: 170 mg via INTRAVENOUS

## 2017-01-19 MED ORDER — TIZANIDINE HCL 4 MG PO TABS
4.0000 mg | ORAL_TABLET | Freq: Every day | ORAL | Status: DC
  Administered 2017-01-19 – 2017-01-20 (×2): 4 mg via ORAL
  Filled 2017-01-19 (×2): qty 1

## 2017-01-19 MED ORDER — PROPOFOL 10 MG/ML IV BOLUS
INTRAVENOUS | Status: AC
  Filled 2017-01-19: qty 20

## 2017-01-19 MED ORDER — DIAZEPAM 5 MG PO TABS
5.0000 mg | ORAL_TABLET | Freq: Four times a day (QID) | ORAL | Status: DC | PRN
  Administered 2017-01-19 – 2017-01-21 (×5): 5 mg via ORAL
  Filled 2017-01-19 (×5): qty 1

## 2017-01-19 MED ORDER — FLUOXETINE HCL 20 MG PO CAPS
80.0000 mg | ORAL_CAPSULE | Freq: Every day | ORAL | Status: DC
  Administered 2017-01-19 – 2017-01-20 (×2): 80 mg via ORAL
  Filled 2017-01-19 (×2): qty 4

## 2017-01-19 MED ORDER — ROCURONIUM BROMIDE 10 MG/ML (PF) SYRINGE
PREFILLED_SYRINGE | INTRAVENOUS | Status: AC
  Filled 2017-01-19: qty 5

## 2017-01-19 MED ORDER — PREGABALIN 50 MG PO CAPS
100.0000 mg | ORAL_CAPSULE | Freq: Three times a day (TID) | ORAL | Status: DC
  Administered 2017-01-19 – 2017-01-21 (×6): 100 mg via ORAL
  Filled 2017-01-19 (×6): qty 2

## 2017-01-19 MED ORDER — 0.9 % SODIUM CHLORIDE (POUR BTL) OPTIME
TOPICAL | Status: DC | PRN
  Administered 2017-01-19: 1000 mL

## 2017-01-19 MED ORDER — LATANOPROST 0.005 % OP SOLN
1.0000 [drp] | Freq: Every day | OPHTHALMIC | Status: DC
  Administered 2017-01-19 – 2017-01-20 (×2): 1 [drp] via OPHTHALMIC
  Filled 2017-01-19: qty 2.5

## 2017-01-19 SURGICAL SUPPLY — 63 items
BAG DECANTER FOR FLEXI CONT (MISCELLANEOUS) ×2 IMPLANT
BENZOIN TINCTURE PRP APPL 2/3 (GAUZE/BANDAGES/DRESSINGS) ×2 IMPLANT
BLADE CLIPPER SURG (BLADE) IMPLANT
BUR CUTTER 7.0 ROUND (BURR) IMPLANT
BUR MATCHSTICK NEURO 3.0 LAGG (BURR) ×4 IMPLANT
CANISTER SUCT 3000ML PPV (MISCELLANEOUS) ×2 IMPLANT
CAP LCK SPNE (Orthopedic Implant) ×6 IMPLANT
CAP LOCK SPINE RADIUS (Orthopedic Implant) ×6 IMPLANT
CAP LOCKING (Orthopedic Implant) ×6 IMPLANT
CARTRIDGE OIL MAESTRO DRILL (MISCELLANEOUS) ×1 IMPLANT
CONT SPEC 4OZ CLIKSEAL STRL BL (MISCELLANEOUS) ×2 IMPLANT
COVER BACK TABLE 60X90IN (DRAPES) ×2 IMPLANT
DECANTER SPIKE VIAL GLASS SM (MISCELLANEOUS) ×2 IMPLANT
DERMABOND ADVANCED (GAUZE/BANDAGES/DRESSINGS) ×1
DERMABOND ADVANCED .7 DNX12 (GAUZE/BANDAGES/DRESSINGS) ×1 IMPLANT
DEVICE INTERBODY ELEVATE 9X23 (Cage) ×8 IMPLANT
DIFFUSER DRILL AIR PNEUMATIC (MISCELLANEOUS) ×2 IMPLANT
DRAPE C-ARM 42X72 X-RAY (DRAPES) ×6 IMPLANT
DRAPE HALF SHEET 40X57 (DRAPES) IMPLANT
DRAPE LAPAROTOMY 100X72X124 (DRAPES) ×2 IMPLANT
DRAPE POUCH INSTRU U-SHP 10X18 (DRAPES) ×2 IMPLANT
DRAPE SURG 17X23 STRL (DRAPES) ×8 IMPLANT
DRSG OPSITE POSTOP 4X8 (GAUZE/BANDAGES/DRESSINGS) ×2 IMPLANT
DURAPREP 26ML APPLICATOR (WOUND CARE) ×2 IMPLANT
ELECT REM PT RETURN 9FT ADLT (ELECTROSURGICAL) ×2
ELECTRODE REM PT RTRN 9FT ADLT (ELECTROSURGICAL) ×1 IMPLANT
EVACUATOR 1/8 PVC DRAIN (DRAIN) IMPLANT
GAUZE SPONGE 4X4 12PLY STRL (GAUZE/BANDAGES/DRESSINGS) ×2 IMPLANT
GAUZE SPONGE 4X4 16PLY XRAY LF (GAUZE/BANDAGES/DRESSINGS) IMPLANT
GLOVE ECLIPSE 9.0 STRL (GLOVE) ×4 IMPLANT
GLOVE EXAM NITRILE LRG STRL (GLOVE) IMPLANT
GLOVE EXAM NITRILE XL STR (GLOVE) IMPLANT
GLOVE EXAM NITRILE XS STR PU (GLOVE) IMPLANT
GOWN STRL REUS W/ TWL LRG LVL3 (GOWN DISPOSABLE) IMPLANT
GOWN STRL REUS W/ TWL XL LVL3 (GOWN DISPOSABLE) ×2 IMPLANT
GOWN STRL REUS W/TWL 2XL LVL3 (GOWN DISPOSABLE) IMPLANT
GOWN STRL REUS W/TWL LRG LVL3 (GOWN DISPOSABLE)
GOWN STRL REUS W/TWL XL LVL3 (GOWN DISPOSABLE) ×2
GRAFT BN 10X1XDBM MAGNIFUSE (Bone Implant) ×1 IMPLANT
GRAFT BONE MAGNIFUSE 1X10CM (Bone Implant) ×1 IMPLANT
KIT BASIN OR (CUSTOM PROCEDURE TRAY) ×2 IMPLANT
KIT ROOM TURNOVER OR (KITS) ×2 IMPLANT
MILL MEDIUM DISP (BLADE) ×2 IMPLANT
NEEDLE HYPO 22GX1.5 SAFETY (NEEDLE) ×2 IMPLANT
NS IRRIG 1000ML POUR BTL (IV SOLUTION) ×2 IMPLANT
OIL CARTRIDGE MAESTRO DRILL (MISCELLANEOUS) ×2
PACK LAMINECTOMY NEURO (CUSTOM PROCEDURE TRAY) ×2 IMPLANT
RASP 3.0MM (RASP) ×2 IMPLANT
ROD 80MM (Rod) ×2 IMPLANT
ROD SPNL 80X5.5 NS TI RDS (Rod) ×2 IMPLANT
SCREW 5.75X40M (Screw) ×12 IMPLANT
SCREW 6.75X40MM (Screw) ×2 IMPLANT
SPONGE LAP 4X18 X RAY DECT (DISPOSABLE) ×2 IMPLANT
SPONGE SURGIFOAM ABS GEL 100 (HEMOSTASIS) ×4 IMPLANT
STRIP CLOSURE SKIN 1/2X4 (GAUZE/BANDAGES/DRESSINGS) ×2 IMPLANT
SUT VIC AB 0 CT1 18XCR BRD8 (SUTURE) ×3 IMPLANT
SUT VIC AB 0 CT1 8-18 (SUTURE) ×3
SUT VIC AB 2-0 CT1 18 (SUTURE) ×4 IMPLANT
SUT VIC AB 3-0 SH 8-18 (SUTURE) ×4 IMPLANT
TOWEL GREEN STERILE (TOWEL DISPOSABLE) ×2 IMPLANT
TOWEL GREEN STERILE FF (TOWEL DISPOSABLE) ×2 IMPLANT
TRAY FOLEY W/METER SILVER 16FR (SET/KITS/TRAYS/PACK) ×2 IMPLANT
WATER STERILE IRR 1000ML POUR (IV SOLUTION) ×2 IMPLANT

## 2017-01-19 NOTE — Anesthesia Postprocedure Evaluation (Signed)
Anesthesia Post Note  Patient: Hannah Kaiser  Procedure(s) Performed: Procedure(s) (LRB): Posterior Lumbar Interbody Fusion -Lumbar Three- Lumbar Four, Lumbar Four- Lumbar Five, removal of  screws (N/A)     Patient location during evaluation: PACU Anesthesia Type: General Level of consciousness: awake and alert Pain management: pain level controlled Vital Signs Assessment: post-procedure vital signs reviewed and stable Respiratory status: spontaneous breathing, nonlabored ventilation, respiratory function stable and patient connected to nasal cannula oxygen Cardiovascular status: blood pressure returned to baseline and stable Postop Assessment: no signs of nausea or vomiting Anesthetic complications: no    Last Vitals:  Vitals:   01/19/17 1401 01/19/17 1420  BP:  106/62  Pulse:  (!) 54  Resp:  18  Temp: (!) 36.1 C 36.6 C  SpO2:  100%    Last Pain:  Vitals:   01/19/17 1345  TempSrc:   PainSc: [name]

## 2017-01-19 NOTE — Brief Op Note (Signed)
01/19/2017  12:12 PM  PATIENT:  Hannah Kaiser  56 y.o. female  PRE-OPERATIVE DIAGNOSIS:  Spondylolisthesis  POST-OPERATIVE DIAGNOSIS:  Spondylolisthesis  PROCEDURE:  Procedure(s): Posterior Lumbar Interbody Fusion -Lumbar Three- Lumbar Four, Lumbar Four- Lumbar Five, removal of  screws (N/A)  SURGEON:  Surgeon(s) and Role:    * Earnie Larsson, MD - Primary    * Consuella Lose, MD - Assisting  PHYSICIAN ASSISTANT:   ASSISTANTS:    ANESTHESIA:   general  EBL:  Total I/O In: 1600 [I.V.:1600] Out: 900 [Urine:500; Blood:400]  BLOOD ADMINISTERED:none  DRAINS: none   LOCAL MEDICATIONS USED:  MARCAINE     SPECIMEN:  No Specimen  DISPOSITION OF SPECIMEN:  N/A  COUNTS:  YES  TOURNIQUET:  * No tourniquets in log *  DICTATION: .Dragon Dictation  PLAN OF CARE: Admit to inpatient   PATIENT DISPOSITION:  PACU - hemodynamically stable.   Delay start of Pharmacological VTE agent (>24hrs) due to surgical blood loss or risk of bleeding: yes

## 2017-01-19 NOTE — Anesthesia Procedure Notes (Signed)
Procedure Name: Intubation Date/Time: 01/19/2017 8:12 AM Performed by: Lavell Luster Pre-anesthesia Checklist: Patient identified, Emergency Drugs available, Suction available, Patient being monitored and Timeout performed Patient Re-evaluated:Patient Re-evaluated prior to induction Oxygen Delivery Method: Circle system utilized Preoxygenation: Pre-oxygenation with 100% oxygen Induction Type: IV induction Ventilation: Mask ventilation without difficulty Laryngoscope Size: Mac and 3 Grade View: Grade I Tube type: Oral Tube size: 7.0 mm Number of attempts: 1 Airway Equipment and Method: Stylet Placement Confirmation: ETT inserted through vocal cords under direct vision,  positive ETCO2 and breath sounds checked- equal and bilateral Secured at: 21 cm Tube secured with: Tape Dental Injury: Teeth and Oropharynx as per pre-operative assessment

## 2017-01-19 NOTE — H&P (Signed)
Hannah Kaiser is an 56 y.o. female.   Chief Complaint: Back pain HPI: 56 year old female status post previous L5-S1 decompression and fusion by an outside physician presents with worsening back and bilateral lower extremity pain. Workup demonstrates evidence of marked adjacent level degeneration with associated stenosis at L3-4 and L4-5. Patient is failed conservative management and presents now for decompression and fusion at both levels in hopes of improving her symptoms.  Past Medical History:  Diagnosis Date  . Anxiety   . Arthritis    knees, thoracic spurs and arthritis  . Asthma   . Breast pain   . CHF (congestive heart failure) ([place])   . Colon polyps   . COPD (chronic obstructive pulmonary disease) ([place])   . Depression   . Dysrhythmia   . GERD (gastroesophageal reflux disease)   . Glaucoma   . Heart murmur   . History of blood transfusion   . Hypertension   . Motion sickness    cars  . Multilevel degenerative disc disease   . Neuropathy    Bilateral arms and legs  . Nipple discharge   . Numbness of both lower extremities    bulging lumbar discs - per pt  . Numbness of upper extremity    bilateral - degenerative cervical discs - per pt  . Pneumonia   . PONV (postoperative nausea and vomiting)   . Sleep apnea    has CPAP  . Smokers' cough ([place])   . Wears dentures    full upper and lower    Past Surgical History:  Procedure Laterality Date  . ABDOMINAL HYSTERECTOMY  2014  . BACK SURGERY    . BELPHAROPTOSIS REPAIR    . BROW LIFT Bilateral 03/24/2016   Procedure: BLEPHAROPLASTY;  Surgeon: Karle Starch, MD;  Location: [facility];  Service: Ophthalmology;  Laterality: Bilateral;  sleep apnea  . CARDIAC CATHETERIZATION    . COLONOSCOPY WITH PROPOFOL N/A 10/19/2016   Procedure: COLONOSCOPY WITH PROPOFOL;  Surgeon: Lucilla Lame, MD;  Location: [facility];  Service: Endoscopy;  Laterality: N/A;  . DILATION AND CURETTAGE OF UTERUS    . ECTOPIC PREGNANCY  SURGERY     x2  . ESOPHAGOGASTRODUODENOSCOPY (EGD) WITH PROPOFOL N/A 10/19/2016   Procedure: ESOPHAGOGASTRODUODENOSCOPY (EGD) WITH PROPOFOL;  Surgeon: Lucilla Lame, MD;  Location: [facility];  Service: Endoscopy;  Laterality: N/A;  . LEFT HEART CATH AND CORONARY ANGIOGRAPHY Left 12/29/2016   Procedure: Left Heart Cath and Coronary Angiography;  Surgeon: Dionisio David, MD;  Location: [facility];  Service: Cardiovascular;  Laterality: Left;  . NECK SURGERY  2003  . POLYPECTOMY  10/19/2016   Procedure: POLYPECTOMY;  Surgeon: Lucilla Lame, MD;  Location: [facility];  Service: Endoscopy;;  . Chunky  . TUBAL LIGATION      Family History  Problem Relation Age of Onset  . Cancer Brother        melanoma  . Cancer Maternal Grandfather        prostate  . Cancer Maternal Uncle        lung   Social History:  reports that she has been smoking.  She has a 61.50 pack-year smoking history. She has never used smokeless tobacco. She reports that she does not drink alcohol or use drugs.  Allergies:  Allergies  Allergen Reactions  . Abilify [Aripiprazole] Swelling    Whole  body swelling, retains fluid  . Ace Inhibitors Cough    So bad  she vomited  . Wellbutrin [Bupropion] Other (See Comments)    Worsened depression  . Azithromycin Rash  . Erythromycin Rash    Medications Prior to Admission  Medication Sig Dispense Refill  . amLODipine (NORVASC) 5 MG tablet Take 1 tablet (5 mg total) by mouth daily. 90 tablet 1  . atenolol (TENORMIN) 25 MG tablet Take 1 tablet (25 mg total) by mouth daily. 90 tablet 1  . atorvastatin (LIPITOR) 40 MG tablet Take 40 mg by mouth daily.  0  . clonazePAM (KLONOPIN) 0.5 MG tablet Take 1 tablet (0.5 mg total) by mouth daily as needed for anxiety. 30 tablet 0  . doxycycline (VIBRAMYCIN) 100 MG capsule Take 1 capsule (100 mg total) by mouth 2 (two) times daily. 14 capsule 0  . FLUoxetine (PROZAC) 40 MG capsule Take 80 mg by  mouth daily.    . furosemide (LASIX) 20 MG tablet take 1 tablet by mouth daily if needed for SWELLING (Patient taking differently: Take 20 mg by mouth daily. ) 30 tablet 3  . hydrOXYzine (ATARAX/VISTARIL) 25 MG tablet Take 1 tablet (25 mg total) by mouth 3 (three) times daily as needed. (Patient taking differently: Take 25 mg by mouth 2 (two) times daily. ) 90 tablet 1  . latanoprost (XALATAN) 0.005 % ophthalmic solution Place 1 drop into both eyes at bedtime.    Marland Kitchen losartan (COZAAR) 100 MG tablet Take 1 tablet (100 mg total) by mouth daily. 90 tablet 1  . pantoprazole (PROTONIX) 40 MG tablet Take 40 mg by mouth daily.   0  . predniSONE (DELTASONE) 50 MG tablet 1 tab PO daily 5 tablet 0  . pregabalin (LYRICA) 100 MG capsule Take 1 capsule (100 mg total) by mouth 3 (three) times daily. (Patient taking differently: Take 300 mg by mouth daily. ) 270 capsule 1  . tiZANidine (ZANAFLEX) 4 MG tablet Take 1 tablet (4 mg total) by mouth at bedtime. 30 tablet 2  . traMADol (ULTRAM) 50 MG tablet Take 1 tablet (50 mg total) by mouth every 6 (six) hours as needed for moderate pain. 30 tablet 0  . valACYclovir (VALTREX) 1000 MG tablet Take 1 tablet (1,000 mg total) by mouth daily. 90 tablet 1  . albuterol (PROVENTIL HFA;VENTOLIN HFA) 108 (90 BASE) MCG/ACT inhaler Inhale 2 puffs into the lungs every 6 (six) hours as needed for wheezing or shortness of breath.    . ezetimibe (ZETIA) 10 MG tablet Take 1 tablet (10 mg total) by mouth daily. (Patient not taking: Reported on 01/14/2017) 30 tablet 3  . nystatin-triamcinolone ointment (MYCOLOG) Apply 1 application topically 2 (two) times daily. (Patient taking differently: Apply 1 application topically daily as needed (itching). ) 30 g 1  . omeprazole (PRILOSEC) 40 MG capsule Take 1 capsule (40 mg total) by mouth daily. (Patient not taking: Reported on 01/12/2017) 30 capsule 6    No results found for this or any previous visit (from the past 48 hour(s)). No results  found.  Pertinent items noted in HPI and remainder of comprehensive ROS otherwise negative.  Blood pressure (!) 180/67, pulse (!) 57, temperature 98.2 F (36.8 C), temperature source Oral, resp. rate 18, SpO2 100 %.  Patient is awake and alert. She is oriented and appropriate. Cranial nerve function intact. Speech fluent. Judgment and insight intact. Motor and sensory function extremities normal. Wound clean and dry. Chest and abdomen benign. Extremities free from injury deformity. 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 Plan  L3-4, L4-5 decompressive laminotomies and foraminotomies followed by posterior lumbar my fusion utilizing interbody peek cages, locally harvested autograft, and augmented with segmental pedicle screw fixation and local autografting. Risks and benefits of been explained. Patient wishes to proceed.  Chigozie Basaldua A 01/19/2017, 7:57 AM

## 2017-01-19 NOTE — Evaluation (Signed)
Physical Therapy Evaluation Patient Details Name: Hannah Kaiser MRN: [id number] DOB: [birth year] Today's Date: 01/19/2017   History of Present Illness  Patient is a 56 yo female s/p Posterior Lumbar Interbody Fusion -Lumbar Three- Lumbar Four, Lumbar Four- Lumbar Five, removal of  screws.  Clinical Impression  Patient seen for mobility assessment s/p spinal surgery. Patient demonstrates deficits in functional mobility as indicated below. Will benefit from continued skilled PT to address deficits and maximize function. Will see as indicated and progress as tolerated.  Educated patient on precautions, mobility expectations, safety, bed mobility and transfers.     Follow Up Recommendations No PT follow up;Supervision - Intermittent    Equipment Recommendations  None recommended by PT    Recommendations for Other Services       Precautions / Restrictions Precautions Precautions: Back Precaution Booklet Issued: Yes (comment) Precaution Comments: verbally reviewed with patient Required Braces or Orthoses: Spinal Brace Spinal Brace: Lumbar corset Restrictions Weight Bearing Restrictions: No      Mobility  Bed Mobility Overal bed mobility: Needs Assistance Bed Mobility: Rolling;Sidelying to Sit;Sit to Sidelying Rolling: Min guard Sidelying to sit: Min guard     Sit to sidelying: Min assist General bed mobility comments: VCs for technicque and seqeuncing, min guard to get OOB, min assist to elevate LEs back to bed.  Transfers Overall transfer level: Needs assistance Equipment used: 1 person hand held assist Transfers: Sit to/from Stand Sit to Stand: Min assist         General transfer comment: Min assist for stability when coming to upright  Ambulation/Gait Ambulation/Gait assistance: Min assist Ambulation Distance (Feet): 280 Feet Assistive device: 1 person hand held assist Gait Pattern/deviations: Step-through pattern;Decreased stride length;Shuffle;Drifts  right/left Gait velocity: decreased Gait velocity interpretation: <1.8 ft/sec, indicative of risk for recurrent falls General Gait Details: slow guarded gait, some instability noted. Min assist for stability with ambulation. VCs for increased gait speed/cadence  Stairs            Wheelchair Mobility    Modified Rankin (Stroke Patients Only)       Balance Overall balance assessment: Needs assistance   Sitting balance-Leahy Scale: Good     Standing balance support: No upper extremity supported;During functional activity Standing balance-Leahy Scale: Fair                               Pertinent Vitals/Pain Pain Assessment: 0-10 Pain Score: 8  Pain Location: right hip and back Pain Descriptors / Indicators: Aching;Grimacing;Guarding;Spasm;Sore Pain Intervention(s): Limited activity within patient's tolerance;Monitored during session;Repositioned    Home Living Family/patient expects to be discharged to:: Private residence Living Arrangements: Parent;Other relatives Available Help at Discharge: Family Type of Home: House Home Access: Stairs to enter Entrance Stairs-Rails: Can reach both Entrance Stairs-Number of Steps: 3 Home Layout: One level Home Equipment: None      Prior Function Level of Independence: Independent               Hand Dominance   Dominant Hand: Right    Extremity/Trunk Assessment   Upper Extremity Assessment Upper Extremity Assessment: Overall WFL for tasks assessed    Lower Extremity Assessment Lower Extremity Assessment: Overall WFL for tasks assessed       Communication   Communication: No difficulties  Cognition Arousal/Alertness: Awake/alert Behavior During Therapy: WFL for tasks assessed/performed Overall Cognitive Status: Within Functional Limits for tasks assessed  General Comments      Exercises     Assessment/Plan    PT Assessment Patient  needs continued PT services  PT Problem List Decreased strength;Decreased activity tolerance;Decreased balance;Decreased mobility;Decreased knowledge of precautions;Pain       PT Treatment Interventions DME instruction;Gait training;Stair training;Functional mobility training;Therapeutic activities;Therapeutic exercise;Balance training;Patient/family education    PT Goals (Current goals can be found in the Care Plan section)  Acute Rehab PT Goals Patient Stated Goal: to go home PT Goal Formulation: With patient/family Time For Goal Achievement: 02/02/17 Potential to Achieve Goals: Good    Frequency Min 5X/week   Barriers to discharge        Co-evaluation               AM-PAC PT "6 Clicks" Daily Activity  Outcome Measure Difficulty turning over in bed (including adjusting bedclothes, sheets and blankets)?: A Little Difficulty moving from lying on back to sitting on the side of the bed? : Total Difficulty sitting down on and standing up from a chair with arms (e.g., wheelchair, bedside commode, etc,.)?: Total Help needed moving to and from a bed to chair (including a wheelchair)?: A Little Help needed walking in hospital room?: A Little Help needed climbing 3-5 steps with a railing? : A Little 6 Click Score: 14    End of Session Equipment Utilized During Treatment: Back brace;Oxygen Activity Tolerance: Patient tolerated treatment well Patient left: in bed;with call bell/phone within reach;with family/visitor present Nurse Communication: Mobility status PT Visit Diagnosis: Unsteadiness on feet (R26.81);Difficulty in walking, not elsewhere classified (R26.2)    Time: 9432-7614 PT Time Calculation (min) (ACUTE ONLY): 20 min   Charges:   PT Evaluation $PT Eval Moderate Complexity: 1 Mod     PT G Codes:        Alben Deeds, PT DPT  Board Certified Neurologic Specialist [place] 01/19/2017, 4:48 PM

## 2017-01-19 NOTE — Op Note (Signed)
Date of procedure: 01/19/2017  Date of dictation: Same  Service: Neurosurgery  Preoperative diagnosis: Status post prior L5-S1 posterior lateral fusion with pedicle screw instrumentation, fractured S1 pedicle screws bilaterally, possible pseudoarthrosis.  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  L3-4, L4-5 posterior lumbar interbody fusion utilizing interbody expandable cages and locally harvested autograft  L3-4 right 5 posterior lateral arthrodesis utilizing segmental pedicle screw fixation, local autograft, and morselized allograft.  Surgeon:Cyara Devoto A.Vi Biddinger, M.D.  Asst. Surgeon: Kathyrn Sheriff   Anesthesia: General  Indication: Patient is a 56 year old female status post prior L5-S1 posterior lateral arthrodesis with pedicle screw fixation done in 1994. Workup has demonstrated evidence of fracturing of both S1 pedicle screws. Radiographic workup seems to indicate solid fusion at L5-S1 in spite of this. Patient with progressive back and bilateral lower extremity pain. Workup demonstrates evidence of mobile degenerative spondylolisthesis at L3-4 and L4-5 with significant associated stenosis and symptoms of neurogenic claudication. Patient presents now for reexploration of her prior fusion with removal of broken instrumentation followed by L3-4 and L4-5 decompression and fusion.  Operative note: After induction of anesthesia, patient position prone onto Wilson frame and a properly padded. Lumbar region prepped and draped sterilely. Incision made from L3-S1. Dissection performed bilaterally. Retractor placed.  Fluoroscopy used. Levels confirmed. Previously placed pedicle screw station was dissected free. This was disassembled and removed. The S1 screws were fractured bilaterally. The fractured shafts were allowed to remain in the sacrum. The pedicle screw of L5 on the left was placed laterally. This was removed. Pedicle screw of L5 on the right was well position but as this was a system that was many years outdated I removed the screw. Fusion at L5-S1 was inspected. There was good posterior lateral bone and solid appearance of the arthrodesis. Attention was then placed at L3-4 and L4-5. Decompressive laminotomies and foraminotomies then performed using Leksell rongeurs and Kerrison rongeurs to remove the inferior two thirds of the lamina of L3 and L4. The entire inferior facet and pars interarticularis of L3 and L4 bilaterally. The superior facets of L4 and L5 were also resected. Ligament flavum was elevated and resected. Foraminotomies inaccessible would be needed for interbody fusion was then performed along the course exiting L3-L4 and L5 nerve roots bilaterally. Bilateral discectomies then performed at L3-4 and L4-5. Disc spaces and prepared for interbody fusion. With the distractor placed in patient's right side. Disc space was further scraped and cleaned of soft tissue. At L3-4 on the left side a 9 mm extra lordotic [employer] expandable cage packed with locally harvested autograft was then impacted in place and expanded to its full extent. Distractor was removed patient's right side. Disc spaces per the right side. Morselized autograft was packed in the interspace. A second cage was then impacted into place and expanded to its full extent. Procedure then repeated at L4-5 again using 9 mm extra lordotic implants and locally harvested autograft. Pedicles of L3-4 and 5 were then identified using surface landmarks and intraoperative fluoroscopy. Sufficient bone of and pedicle was removed using high-speed drill. Each  pedicles and probed using pedicle awl each pedicle awl track was then tapped with a screw tap. Each screw tap hole was probed and found to be  solidly within the bone. 5.75 x 40 mm radius brand screws from [employer] were placed bilaterally at L3-L4 and L5. Final images revealed good position the hardware and cages at proper upper level with normal alignment spine. Wounds and irrigated one final time. Short segment titanium rods and placed over the screw heads from L3-L5. Locking caps placed over the screws. Locking caps and engaged with the construct under mild compression. Transverse processes were decorticated. Morselize autograft mixed with magnetic diffuse packets were packed posterior laterally for later fusion. Gelfoam was placed over the laminotomy defects. Vancomycin powder was placed the deep wound space. Wounds and close in layers of Vicryl sutures. Steri-Strips and sterile dressing were applied. No apparent complications. Patient tolerated the procedure well and she returns to the recovery room postop.

## 2017-01-19 NOTE — Transfer of Care (Signed)
Immediate Anesthesia Transfer of Care Note  Patient: Hannah Kaiser  Procedure(s) Performed: Procedure(s): Posterior Lumbar Interbody Fusion -Lumbar Three- Lumbar Four, Lumbar Four- Lumbar Five, removal of  screws (N/A)  Patient Location: PACU  Anesthesia Type:General  Level of Consciousness: awake, alert  and oriented  Airway & Oxygen Therapy: Patient connected to face mask oxygen  Post-op Assessment: Post -op Vital signs reviewed and stable  Post vital signs: stable  Last Vitals:  Vitals:   01/19/17 0644 01/19/17 1219  BP: (!) 180/67   Pulse:    Resp:    Temp:  (!) (P) 36.4 C  SpO2:      Last Pain:  Vitals:   01/19/17 0708  TempSrc:   PainSc: 6       Patients Stated Pain Goal: 5 (40/97/35 3299)  Complications: No apparent anesthesia complications

## 2017-01-20 ENCOUNTER — Ambulatory Visit: Payer: BLUE CROSS/BLUE SHIELD | Admitting: Urology

## 2017-01-20 MED FILL — Thrombin For Soln 20000 Unit: CUTANEOUS | Qty: 1 | Status: AC

## 2017-01-20 MED FILL — Gelatin Absorbable Sponge Size 100: CUTANEOUS | Qty: 1 | Status: AC

## 2017-01-20 MED FILL — Heparin Sodium (Porcine) Inj 1000 Unit/ML: INTRAMUSCULAR | Qty: 30 | Status: AC

## 2017-01-20 MED FILL — Sodium Chloride IV Soln 0.9%: INTRAVENOUS | Qty: 1000 | Status: AC

## 2017-01-20 NOTE — Progress Notes (Signed)
Postoperative day 1. Patient overall doing well. Pain well controlled. No radicular pain. Ambulating with minimal assistance.  Awake and alert. Oriented and appropriate. Motor and sensory function intact. Wound with some bloody drainage. Otherwise we will is good.  Progressing well following multilevel lumbar decompression and fusion. Mobilize more today. Possible discharge tomorrow

## 2017-01-20 NOTE — Progress Notes (Signed)
Physical Therapy Treatment Patient Details Name: Hannah Kaiser MRN: [id number] DOB: 05-May-1961 Today's Date: 01/20/2017    History of Present Illness Patient is a 56 yo female s/p Posterior Lumbar Interbody Fusion -Lumbar Three- Lumbar Four, Lumbar Four- Lumbar Five, removal of  screws.    PT Comments    Pt progressing towards physical therapy goals. Was able to perform transfers and ambulation with gross min guard assist to supervision for safety. Pt was educated on precautions, stair negotiation, brace application and wearing schedule. Will review car transfer next session to ensure proper maintenance of precautions.    Follow Up Recommendations  No PT follow up;Supervision - Intermittent     Equipment Recommendations  None recommended by PT    Recommendations for Other Services       Precautions / Restrictions Precautions Precautions: Back Precaution Booklet Issued: Yes (comment) Precaution Comments: verbally reviewed with patient Required Braces or Orthoses: Spinal Brace Spinal Brace: Lumbar corset Restrictions Weight Bearing Restrictions: No    Mobility  Bed Mobility Overal bed mobility: Needs Assistance Bed Mobility: Rolling;Sidelying to Sit;Sit to Sidelying Rolling: Modified independent (Device/Increase time) Sidelying to sit: Min guard     Sit to sidelying: Min guard General bed mobility comments: VCs for technicque and sequencing, min guard to get OOB  Transfers Overall transfer level: Needs assistance Equipment used: 1 person hand held assist Transfers: Sit to/from Stand Sit to Stand: Min guard         General transfer comment: Close guard for safety as pt powers up to full standing position.   Ambulation/Gait Ambulation/Gait assistance: Min guard Ambulation Distance (Feet): 280 Feet Assistive device: None Gait Pattern/deviations: Step-through pattern;Decreased stride length;Shuffle;Drifts right/left Gait velocity: decreased Gait velocity  interpretation: <1.8 ft/sec, indicative of risk for recurrent falls General Gait Details: Slow, guarded gait however overall steady. No obvious losses of balance noted.    Stairs Stairs: Yes   Stair Management: One rail Right;Step to pattern;Forwards Number of Stairs: 3 General stair comments: VC's for sequencing and general safety with stair negotiation.   Wheelchair Mobility    Modified Rankin (Stroke Patients Only)       Balance Overall balance assessment: Needs assistance   Sitting balance-Leahy Scale: Good     Standing balance support: No upper extremity supported;During functional activity Standing balance-Leahy Scale: Fair                              Cognition Arousal/Alertness: Awake/alert Behavior During Therapy: WFL for tasks assessed/performed Overall Cognitive Status: Within Functional Limits for tasks assessed                                        Exercises      General Comments General comments (skin integrity, edema, etc.): Noted increased drainage upon sitting up which had soaked through dressing, bed pad,  and sheet. MD present and RN replaced dressing.       Pertinent Vitals/Pain Pain Assessment: Faces Faces Pain Scale: Hurts little more Pain Location: right hip and back Pain Descriptors / Indicators: Aching;Grimacing;Guarding;Spasm;Sore Pain Intervention(s): Limited activity within patient's tolerance;Monitored during session;Repositioned    Home Living                      Prior Function            PT Goals (current goals  can now be found in the care plan section) Acute Rehab PT Goals Patient Stated Goal: to go home PT Goal Formulation: With patient/family Time For Goal Achievement: 02/02/17 Potential to Achieve Goals: Good Progress towards PT goals: Progressing toward goals    Frequency    Min 5X/week      PT Plan Current plan remains appropriate    Co-evaluation               AM-PAC PT "6 Clicks" Daily Activity  Outcome Measure  Difficulty turning over in bed (including adjusting bedclothes, sheets and blankets)?: A Little Difficulty moving from lying on back to sitting on the side of the bed? : A Little Difficulty sitting down on and standing up from a chair with arms (e.g., wheelchair, bedside commode, etc,.)?: A Little Help needed moving to and from a bed to chair (including a wheelchair)?: A Little Help needed walking in hospital room?: A Little Help needed climbing 3-5 steps with a railing? : A Little 6 Click Score: 18    End of Session Equipment Utilized During Treatment: Back brace Activity Tolerance: Patient tolerated treatment well Patient left: in bed;with call bell/phone within reach;with family/visitor present Nurse Communication: Mobility status PT Visit Diagnosis: Unsteadiness on feet (R26.81);Difficulty in walking, not elsewhere classified (R26.2)     Time: 0258-5277 PT Time Calculation (min) (ACUTE ONLY): 27 min  Charges:  $Gait Training: 23-37 mins                    G Codes:       Hannah Kaiser, PT, DPT Acute Rehabilitation Services Pager: [phone]    Hannah Kaiser 01/20/2017, 8:55 AM

## 2017-01-20 NOTE — Evaluation (Signed)
Occupational Therapy Evaluation and Discharge Patient Details Name: Hannah Kaiser MRN: [id number] DOB: [birth year] Today's Date: 01/20/2017    History of Present Illness Patient is a 56 yo female s/p Posterior Lumbar Interbody Fusion -Lumbar Three- Lumbar Four, Lumbar Four- Lumbar Five, removal of  screws.   Clinical Impression   Pt reports she was independent with ADL PTA. Currently pt overall supervision for ADL and functional mobility. All back, safety, and ADL education completed with pt. Pt planning to d/c home with 24/7 supervision from family. No further acute OT needs identified; signing off at this time. Please re-consult if needs change. Thank you for this referral.    Follow Up Recommendations  No OT follow up    Equipment Recommendations  None recommended by OT    Recommendations for Other Services       Precautions / Restrictions Precautions Precautions: Back Precaution Booklet Issued: No Precaution Comments: Pt able to recall 2/3 back precautions. Reviewed all precuations with pt. Required Braces or Orthoses: Spinal Brace Spinal Brace: Lumbar corset Restrictions Weight Bearing Restrictions: No      Mobility Bed Mobility Overal bed mobility: Needs Assistance Bed Mobility: Sit to Sidelying;Rolling Rolling: Modified independent (Device/Increase time)      Sit to sidelying: Supervision General bed mobility comments: HOB flat without use of bed rail. No assist needed.  Transfers Overall transfer level: Needs assistance Equipment used: None Transfers: Sit to/from Stand Sit to Stand: Supervision         General transfer comment: for safety. no assist needed    Balance Overall balance assessment: Needs assistance Sitting-balance support: No upper extremity supported;Feet supported Sitting balance-Leahy Scale: Good     Standing balance support: No upper extremity supported;During functional activity Standing balance-Leahy Scale: Fair                              ADL either performed or assessed with clinical judgement   ADL Overall ADL's : Needs assistance/impaired Eating/Feeding: Set up;Sitting   Grooming: Supervision/safety;Standing;Wash/dry hands Grooming Details (indicate cue type and reason): Educated on use of 2 cups for oral care Upper Body Bathing: Set up;Sitting   Lower Body Bathing: Supervison/ safety;Sit to/from stand   Upper Body Dressing : Set up;Sitting   Lower Body Dressing: Supervision/safety;Sit to/from stand Lower Body Dressing Details (indicate cue type and reason): Pt able to cross foot over opposite knee. Educated on compensatory strategies for LB ADL. Toilet Transfer: Supervision/safety;Ambulation;BSC   Toileting- Water quality scientist and Hygiene: Supervision/safety;Sit to/from stand Toileting - Clothing Manipulation Details (indicate cue type and reason): Educated on proper technique for peri care without twisting and use of wet wipes. Discussed what she can use as toilet aide if needed Tub/ Shower Transfer: Walk-in shower;Supervision/safety;Shower seat;Ambulation   Functional mobility during ADLs: Supervision/safety General ADL Comments: Educated pt on maintaining back precautions during functional activities, keeping frequently used items at coutner top height, log roll technique for bed mobility, frequently mobility thorughout the day upon return home.     Vision         Perception     Praxis      Pertinent Vitals/Pain Pain Assessment: Faces Faces Pain Scale: Hurts little more Pain Location: back, hips Pain Descriptors / Indicators: Aching;Sore Pain Intervention(s): Monitored during session;Repositioned;RN gave pain meds during session     Hand Dominance     Extremity/Trunk Assessment Upper Extremity Assessment Upper Extremity Assessment: Overall WFL for tasks assessed   Lower Extremity Assessment  Lower Extremity Assessment: Defer to PT evaluation   Cervical / Trunk  Assessment Cervical / Trunk Assessment: Other exceptions Cervical / Trunk Exceptions: s/p spinal sx   Communication Communication Communication: No difficulties   Cognition Arousal/Alertness: Awake/alert Behavior During Therapy: WFL for tasks assessed/performed Overall Cognitive Status: Within Functional Limits for tasks assessed                                     General Comments  Noted increased drainage upon sitting up which had soaked through dressing, bed pad,  and sheet. MD present and RN replaced dressing.     Exercises     Shoulder Instructions      Home Living Family/patient expects to be discharged to:: Private residence Living Arrangements: Parent;Other relatives Available Help at Discharge: Family;Available 24 hours/day Type of Home: House Home Access: Stairs to enter [employer] of Steps: 3 Entrance Stairs-Rails: Can reach both Home Layout: One level     Bathroom Shower/Tub: Occupational psychologist: Standard     Home Equipment: Shower seat;Toilet riser          Prior Functioning/Environment Level of Independence: Independent                 OT Problem List:        OT Treatment/Interventions:      OT Goals(Current goals can be found in the care plan section) Acute Rehab OT Goals Patient Stated Goal: to go home OT Goal Formulation: All assessment and education complete, DC therapy  OT Frequency:     Barriers to D/C:            Co-evaluation              AM-PAC PT "6 Clicks" Daily Activity     Outcome Measure Help from another person eating meals?: None Help from another person taking care of personal grooming?: A Little Help from another person toileting, which includes using toliet, bedpan, or urinal?: A Little Help from another person bathing (including washing, rinsing, drying)?: A Little Help from another person to put on and taking off regular upper body clothing?: A Little Help from  another person to put on and taking off regular lower body clothing?: A Little 6 Click Score: 19   End of Session Equipment Utilized During Treatment: Back brace Nurse Communication: Mobility status;Other (comment) (no equipment or f/u needs)  Activity Tolerance: Patient tolerated treatment well Patient left: in bed;with call bell/phone within reach  OT Visit Diagnosis: Unsteadiness on feet (R26.81);Pain Pain - part of body:  (back)                Time: 2947-6546 OT Time Calculation (min): 21 min Charges:  OT General Charges $OT Visit: 1 Procedure OT Evaluation $OT Eval Moderate Complexity: 1 Procedure G-Codes:     [place] A. Ulice Brilliant, M.S., OTR/L Pager: [place] 01/20/2017, 9:50 AM

## 2017-01-21 MED ORDER — DIAZEPAM 5 MG PO TABS: 5.0000 mg | ORAL_TABLET | Freq: Four times a day (QID) | ORAL | 0 refills | Status: DC | PRN

## 2017-01-21 MED ORDER — OXYCODONE-ACETAMINOPHEN 5-325 MG PO TABS: 1.0000 | ORAL_TABLET | ORAL | 0 refills | Status: DC | PRN

## 2017-01-21 NOTE — Progress Notes (Signed)
Physical Therapy Treatment Patient Details Name: Hannah Kaiser MRN: [id number] DOB: February 22, 1961 Today's Date: 01/21/2017    History of Present Illness Patient is a 56 yo female s/p Posterior Lumbar Interbody Fusion -Lumbar Three- Lumbar Four, Lumbar Four- Lumbar Five, removal of  screws.    PT Comments    Pt progressing towards physical therapy goals. Was able to perform transfers and ambulation with gross supervision for safety. Pt reports feeling good about returning home today. Pt endorses ongoing dizzy spells (baseline) but pt had no episodes today during session. Will continue to follow and progress as able per POC.    Follow Up Recommendations  No PT follow up;Supervision - Intermittent     Equipment Recommendations  None recommended by PT    Recommendations for Other Services       Precautions / Restrictions Precautions Precautions: Back Precaution Booklet Issued: No Precaution Comments: Pt able to recall 2/3 back precautions. Reviewed all precuations with pt. Required Braces or Orthoses: Spinal Brace Spinal Brace: Lumbar corset Restrictions Weight Bearing Restrictions: No    Mobility  Bed Mobility               General bed mobility comments: Pt received walking in the room without assistance.   Transfers Overall transfer level: Needs assistance Equipment used: None Transfers: Sit to/from Stand Sit to Stand: Supervision         General transfer comment: for safety. no assist needed  Ambulation/Gait Ambulation/Gait assistance: Min guard Ambulation Distance (Feet): 280 Feet Assistive device: None Gait Pattern/deviations: Step-through pattern;Decreased stride length;Shuffle;Drifts right/left Gait velocity: decreased Gait velocity interpretation: <1.8 ft/sec, indicative of risk for recurrent falls General Gait Details: Slow, guarded gait however overall steady. No obvious losses of balance noted.    Stairs Stairs: Yes   Stair Management: One rail  Right;Step to pattern;Forwards Number of Stairs: 10 General stair comments: VC's for sequencing and general safety with stair negotiation.   Wheelchair Mobility    Modified Rankin (Stroke Patients Only)       Balance Overall balance assessment: Needs assistance Sitting-balance support: No upper extremity supported;Feet supported Sitting balance-Leahy Scale: Good     Standing balance support: No upper extremity supported;During functional activity Standing balance-Leahy Scale: Fair                              Cognition Arousal/Alertness: Awake/alert Behavior During Therapy: WFL for tasks assessed/performed Overall Cognitive Status: Within Functional Limits for tasks assessed                                        Exercises      General Comments        Pertinent Vitals/Pain Pain Assessment: Faces Faces Pain Scale: Hurts little more Pain Location: back, hips Pain Descriptors / Indicators: Aching;Sore Pain Intervention(s): Monitored during session    Home Living                      Prior Function            PT Goals (current goals can now be found in the care plan section) Acute Rehab PT Goals Patient Stated Goal: to go home PT Goal Formulation: With patient/family Time For Goal Achievement: 02/02/17 Potential to Achieve Goals: Good Progress towards PT goals: Progressing toward goals    Frequency  Min 5X/week      PT Plan Current plan remains appropriate    Co-evaluation              AM-PAC PT "6 Clicks" Daily Activity  Outcome Measure  Difficulty turning over in bed (including adjusting bedclothes, sheets and blankets)?: A Little Difficulty moving from lying on back to sitting on the side of the bed? : A Little Difficulty sitting down on and standing up from a chair with arms (e.g., wheelchair, bedside commode, etc,.)?: A Little Help needed moving to and from a bed to chair (including a wheelchair)?:  A Little Help needed walking in hospital room?: A Little Help needed climbing 3-5 steps with a railing? : A Little 6 Click Score: 18    End of Session Equipment Utilized During Treatment: Back brace Activity Tolerance: Patient tolerated treatment well Patient left: in bed;with call bell/phone within reach;with family/visitor present Nurse Communication: Mobility status PT Visit Diagnosis: Unsteadiness on feet (R26.81);Difficulty in walking, not elsewhere classified (R26.2)     Time: 1610-9604 PT Time Calculation (min) (ACUTE ONLY): 10 min  Charges:  $Gait Training: 8-22 mins                    G Codes:       Rolinda Roan, PT, DPT Acute Rehabilitation Services Pager: [phone]    [name] 01/21/2017, 8:56 AM

## 2017-01-21 NOTE — Discharge Instructions (Signed)

## 2017-01-21 NOTE — Discharge Summary (Signed)
Physician Discharge Summary  Patient ID: Hannah Kaiser MRN: [id number] DOB/AGE: [birth year] 56 y.o.  Admit date: 01/19/2017 Discharge date: 01/21/2017  Admission Diagnoses:  Discharge Diagnoses:  Active Problems:   Degenerative spondylolisthesis   Discharged Condition: good  Hospital Course: Patient in the hospital where she underwent an uncomplicated two-level lumbar decompression and fusion. Postoperatively doing well. Preoperative back and lower extremity pain much improved. Ambulating well. Ready for discharge home.  Consults:   Significant Diagnostic Studies:   Treatments:   Discharge Exam: Blood pressure 133/79, pulse 62, temperature 99.1 F (37.3 C), resp. rate 18, SpO2 100 %. Awake and alert. Oriented and appropriate. Counter function intact. Motor sensory function extremities normal. Wound healing well. Chest and abdomen benign.  Disposition: 01-Home or Self Care   Allergies as of 01/21/2017      Reactions   Abilify [aripiprazole] Swelling   Whole  body swelling, retains fluid   Ace Inhibitors Cough   So bad she vomited   Wellbutrin [bupropion] Other (See Comments)   Worsened depression   Azithromycin Rash   Erythromycin Rash      Medication List    TAKE these medications   albuterol 108 (90 Base) MCG/ACT inhaler Commonly known as:  PROVENTIL HFA;VENTOLIN HFA Inhale 2 puffs into the lungs every 6 (six) hours as needed for wheezing or shortness of breath.   amLODipine 5 MG tablet Commonly known as:  NORVASC Take 1 tablet (5 mg total) by mouth daily.   atenolol 25 MG tablet Commonly known as:  TENORMIN Take 1 tablet (25 mg total) by mouth daily.   atorvastatin 40 MG tablet Commonly known as:  LIPITOR Take 40 mg by mouth daily.   clonazePAM 0.5 MG tablet Commonly known as:  KLONOPIN Take 1 tablet (0.5 mg total) by mouth daily as needed for anxiety.   diazepam 5 MG tablet Commonly known as:  VALIUM Take 1-2 tablets (5-10 mg total) by mouth every 6  (six) hours as needed for muscle spasms.   doxycycline 100 MG capsule Commonly known as:  VIBRAMYCIN Take 1 capsule (100 mg total) by mouth 2 (two) times daily.   FLUoxetine 40 MG capsule Commonly known as:  PROZAC Take 80 mg by mouth daily.   furosemide 20 MG tablet Commonly known as:  LASIX take 1 tablet by mouth daily if needed for SWELLING What changed:  how much to take  how to take this  when to take this  additional instructions   hydrOXYzine 25 MG tablet Commonly known as:  ATARAX/VISTARIL Take 1 tablet (25 mg total) by mouth 3 (three) times daily as needed. What changed:  when to take this   latanoprost 0.005 % ophthalmic solution Commonly known as:  XALATAN Place 1 drop into both eyes at bedtime.   losartan 100 MG tablet Commonly known as:  COZAAR Take 1 tablet (100 mg total) by mouth daily.   nystatin-triamcinolone ointment Commonly known as:  MYCOLOG Apply 1 application topically 2 (two) times daily. What changed:  when to take this  reasons to take this   oxyCODONE-acetaminophen 5-325 MG tablet Commonly known as:  ROXICET Take 1-2 tablets by mouth every 4 (four) hours as needed.   pantoprazole 40 MG tablet Commonly known as:  PROTONIX Take 40 mg by mouth daily.   predniSONE 50 MG tablet Commonly known as:  DELTASONE 1 tab PO daily   pregabalin 100 MG capsule Commonly known as:  LYRICA Take 1 capsule (100 mg total) by mouth 3 (three) times  daily. What changed:  how much to take  when to take this   tiZANidine 4 MG tablet Commonly known as:  ZANAFLEX Take 1 tablet (4 mg total) by mouth at bedtime.   traMADol 50 MG tablet Commonly known as:  ULTRAM Take 1 tablet (50 mg total) by mouth every 6 (six) hours as needed for moderate pain.   valACYclovir 1000 MG tablet Commonly known as:  VALTREX Take 1 tablet (1,000 mg total) by mouth daily.            Durable Medical Equipment        Start     Ordered   01/19/17 1423  DME  Walker rolling  Once    Question:  Patient needs a walker to treat with the following condition  Answer:  Degenerative spondylolisthesis   01/19/17 1423   01/19/17 1423  DME 3 n 1  Once     01/19/17 1423       Signed: Ridhi Hoffert A 01/21/2017, 8:18 AM

## 2017-01-21 NOTE — Progress Notes (Signed)
Patient alert and oriented, mae's well, voiding adequate amount of urine, swallowing without difficulty, no c/o pain at time of discharge. Patient discharged home with family. Script and discharged instructions given to patient. Patient and family stated understanding of instructions given. Patient has an appointment with Dr. Pool  

## 2017-01-25 ENCOUNTER — Telehealth: Payer: Self-pay

## 2017-01-25 MED ORDER — FLUCONAZOLE 150 MG PO TABS: 150.0000 mg | ORAL_TABLET | Freq: Once | ORAL | 0 refills | Status: AC

## 2017-01-25 NOTE — Telephone Encounter (Signed)
Patient states that the antibiotic she was given from her surgery is causing her to have a yeast infection. She would like something sent to [facility].

## 2017-01-28 ENCOUNTER — Telehealth: Payer: Self-pay

## 2017-01-28 DIAGNOSIS — Z981 Arthrodesis status: Secondary | ICD-10-CM

## 2017-01-28 NOTE — Telephone Encounter (Signed)
Home health services initiated for patient for PT on providers request.

## 2017-01-28 NOTE — Telephone Encounter (Signed)
Thank you! This is wonderful.

## 2017-02-01 ENCOUNTER — Telehealth: Payer: Self-pay | Admitting: Family Medicine

## 2017-02-01 NOTE — Telephone Encounter (Signed)
Routing to provider  

## 2017-02-01 NOTE — Telephone Encounter (Signed)
Message left for Hannah Kaiser giving verbal orders.

## 2017-02-01 NOTE — Telephone Encounter (Signed)
Roselie Awkward, PT with [facility] called and would like to request verbal orders for the pt to have PT 1 time a week for 1 week, 2 times a week for 2 weeks and 1 time a week for 1 week.

## 2017-02-01 NOTE — Telephone Encounter (Signed)
Sounds perfect!! Thanks.

## 2017-02-05 ENCOUNTER — Telehealth: Payer: Self-pay | Admitting: Family Medicine

## 2017-02-05 NOTE — Telephone Encounter (Signed)
Routing to provider  

## 2017-02-05 NOTE — Telephone Encounter (Signed)
Noted! Thank you

## 2017-02-05 NOTE — Telephone Encounter (Signed)
Noted  

## 2017-02-05 NOTE — Telephone Encounter (Signed)
Hannah Kaiser with [facility] called regarding Ms. Arlis having high BP today before taking her medication. 174/106, he advised her to take her meds then recheck her BP within the next couple of hours and to call if her BP was still running high.  He also states her pain level is still 8/10 but no signs of infection with some light nausea but no vomitting.  Hannah Kaiser can be reached at [phone]  Thank You

## 2017-02-08 ENCOUNTER — Other Ambulatory Visit: Payer: Self-pay | Admitting: Family Medicine

## 2017-02-12 ENCOUNTER — Telehealth: Payer: Self-pay | Admitting: Family Medicine

## 2017-02-12 NOTE — Telephone Encounter (Addendum)
Patient reportedly rolled out of her bed falling on her bottom and is in a lot of pain. She went to Dr Trenton Gammon today. She was not able to do PT today as she was hurting and sore. They did xray but nothing was broken.  Thank Sampson Si with [facility] [phone]

## 2017-02-12 NOTE — Telephone Encounter (Signed)
Noted. Thanks.

## 2017-02-12 NOTE — Telephone Encounter (Signed)
Routing to provider, FYI.  

## 2017-02-14 ENCOUNTER — Emergency Department: Payer: BLUE CROSS/BLUE SHIELD

## 2017-02-14 ENCOUNTER — Encounter: Payer: Self-pay | Admitting: Emergency Medicine

## 2017-02-14 ENCOUNTER — Emergency Department
Admission: EM | Admit: 2017-02-14 | Discharge: 2017-02-14 | Disposition: A | Discharge status: Home / Self Care | Payer: BLUE CROSS/BLUE SHIELD | Attending: Emergency Medicine | Admitting: Emergency Medicine

## 2017-02-14 DIAGNOSIS — J449 Chronic obstructive pulmonary disease, unspecified: Secondary | ICD-10-CM | POA: Diagnosis not present

## 2017-02-14 DIAGNOSIS — M7989 Other specified soft tissue disorders: Secondary | ICD-10-CM | POA: Diagnosis not present

## 2017-02-14 DIAGNOSIS — M5431 Sciatica, right side: Secondary | ICD-10-CM

## 2017-02-14 DIAGNOSIS — F17200 Nicotine dependence, unspecified, uncomplicated: Secondary | ICD-10-CM | POA: Insufficient documentation

## 2017-02-14 DIAGNOSIS — M79661 Pain in right lower leg: Secondary | ICD-10-CM | POA: Diagnosis present

## 2017-02-14 DIAGNOSIS — N189 Chronic kidney disease, unspecified: Secondary | ICD-10-CM | POA: Insufficient documentation

## 2017-02-14 DIAGNOSIS — I509 Heart failure, unspecified: Secondary | ICD-10-CM | POA: Insufficient documentation

## 2017-02-14 DIAGNOSIS — I130 Hypertensive heart and chronic kidney disease with heart failure and stage 1 through stage 4 chronic kidney disease, or unspecified chronic kidney disease: Secondary | ICD-10-CM | POA: Insufficient documentation

## 2017-02-14 DIAGNOSIS — J45909 Unspecified asthma, uncomplicated: Secondary | ICD-10-CM | POA: Diagnosis not present

## 2017-02-14 DIAGNOSIS — Z79899 Other long term (current) drug therapy: Secondary | ICD-10-CM | POA: Insufficient documentation

## 2017-02-14 DIAGNOSIS — M4326 Fusion of spine, lumbar region: Secondary | ICD-10-CM | POA: Insufficient documentation

## 2017-02-14 MED ORDER — KETOROLAC TROMETHAMINE 60 MG/2ML IM SOLN
30.0000 mg | Freq: Once | INTRAMUSCULAR | Status: AC
  Administered 2017-02-14: 30 mg via INTRAMUSCULAR
  Filled 2017-02-14: qty 2

## 2017-02-14 MED ORDER — PREDNISONE 10 MG (21) PO TBPK: ORAL_TABLET | ORAL | 0 refills | Status: DC

## 2017-02-14 MED ORDER — METHYLPREDNISOLONE SODIUM SUCC 125 MG IJ SOLR
125.0000 mg | Freq: Once | INTRAMUSCULAR | Status: AC
  Administered 2017-02-14: 125 mg via INTRAMUSCULAR
  Filled 2017-02-14: qty 2

## 2017-02-14 MED ORDER — IBUPROFEN 600 MG PO TABS: 600.0000 mg | ORAL_TABLET | Freq: Four times a day (QID) | ORAL | 0 refills | Status: DC | PRN

## 2017-02-14 MED ORDER — OXYCODONE-ACETAMINOPHEN 5-325 MG PO TABS
2.0000 | ORAL_TABLET | Freq: Once | ORAL | Status: AC
  Administered 2017-02-14: 2 via ORAL
  Filled 2017-02-14: qty 2

## 2017-02-14 NOTE — ED Triage Notes (Signed)
Pt presents to ED c/o R hip pain running down posterior R leg to knee. Had fall last Thursday, accidentally rolled off of bed. Back surgery with hardware replacement 01/19/17, had x-rays done by PCP Friday that were normal. Pain is worst when laying down, able to ambulate from car to wheelchair.

## 2017-02-14 NOTE — ED Notes (Signed)
Patient is complaining of right leg pain that began last night.  Patient reports recent back surgery and a fall on Tuesday.  Patient states it is very painful to move her leg.  Leg appears normal in color and temp.  Warm and dry.  No obvious swelling to right leg at this time.

## 2017-02-14 NOTE — ED Provider Notes (Signed)
[facility] Emergency Department Provider Note  ____________________________________________  Time seen: Approximately 2:30 PM  I have reviewed the triage vital signs and the nursing notes.   HISTORY  Chief Complaint Hip Pain and Leg Pain    HPI Hannah Kaiser is a 56 y.o. female that presents to the emergency department for evaluation of right leg pain for one day. Pain starts in her hip and travels down the back of her leg. She rolled out of bed on Tuesday and landed on her right side. She had back surgery in August for a lumbar fusion. She saw her surgeon on Friday, who did back x-rays and stated that everything looks good. She denies bowel or bladder dysfunction or saddle paresthesias. No shortness breath, chest pain, nausea, vomiting, abdominal pain, calf pain, numbness, tingling.   Past Medical History:  Diagnosis Date  . Anxiety   . Arthritis    knees, thoracic spurs and arthritis  . Asthma   . Breast pain   . CHF (congestive heart failure) ([place])   . Colon polyps   . COPD (chronic obstructive pulmonary disease) ([place])   . Depression   . Dysrhythmia   . GERD (gastroesophageal reflux disease)   . Glaucoma   . Heart murmur   . History of blood transfusion   . Hypertension   . Motion sickness    cars  . Multilevel degenerative disc disease   . Neuropathy    Bilateral arms and legs  . Nipple discharge   . Numbness of both lower extremities    bulging lumbar discs - per pt  . Numbness of upper extremity    bilateral - degenerative cervical discs - per pt  . Pneumonia   . PONV (postoperative nausea and vomiting)   . Sleep apnea    has CPAP  . Smokers' cough ([place])   . Wears dentures    full upper and lower    Patient Active Problem List   Diagnosis Date Noted  . Degenerative spondylolisthesis 01/19/2017  . Personal history of colonic polyps   . Polyp of sigmoid colon   . Problems with swallowing and mastication   . Gastritis without  bleeding   . Hyperlipidemia 09/28/2016  . Bladder prolapse, female, acquired 02/24/2016  . Arthritis of both knees 01/14/2016  . Herpes simplex infection 12/03/2015  . Depression 12/03/2015  . Controlled substance agreement signed 12/03/2015  . GERD (gastroesophageal reflux disease) 07/29/2015  . Primary osteoarthritis of both knees 04/12/2015  . COPD (chronic obstructive pulmonary disease) ([place]) 03/26/2015  . Benign hypertensive renal disease 03/13/2015  . Dark urine 03/13/2015    Past Surgical History:  Procedure Laterality Date  . ABDOMINAL HYSTERECTOMY  2014  . BACK SURGERY    . BELPHAROPTOSIS REPAIR    . BROW LIFT Bilateral 03/24/2016   Procedure: BLEPHAROPLASTY;  Surgeon: Karle Starch, MD;  Location: [place];  Service: Ophthalmology;  Laterality: Bilateral;  sleep apnea  . CARDIAC CATHETERIZATION    . COLONOSCOPY WITH PROPOFOL N/A 10/19/2016   Procedure: COLONOSCOPY WITH PROPOFOL;  Surgeon: Lucilla Lame, MD;  Location: [place];  Service: Endoscopy;  Laterality: N/A;  . DILATION AND CURETTAGE OF UTERUS    . ECTOPIC PREGNANCY SURGERY     x2  . ESOPHAGOGASTRODUODENOSCOPY (EGD) WITH PROPOFOL N/A 10/19/2016   Procedure: ESOPHAGOGASTRODUODENOSCOPY (EGD) WITH PROPOFOL;  Surgeon: Lucilla Lame, MD;  Location: [place];  Service: Endoscopy;  Laterality: N/A;  . LEFT HEART CATH AND CORONARY ANGIOGRAPHY Left  12/29/2016   Procedure: Left Heart Cath and Coronary Angiography;  Surgeon: Dionisio David, MD;  Location: [place] [facility];  Service: Cardiovascular;  Laterality: Left;  . NECK SURGERY  2003  . POLYPECTOMY  10/19/2016   Procedure: POLYPECTOMY;  Surgeon: Lucilla Lame, MD;  Location: [place];  Service: Endoscopy;;  . [place]  . TUBAL LIGATION      Prior to Admission medications   Medication Sig Start Date End Date Taking? Authorizing Provider  albuterol (PROVENTIL HFA;VENTOLIN HFA) 108 (90 BASE) MCG/ACT inhaler  Inhale 2 puffs into the lungs every 6 (six) hours as needed for wheezing or shortness of breath.    [provider]  amLODipine (NORVASC) 5 MG tablet Take 1 tablet (5 mg total) by mouth daily. 09/22/16   Johnson, Megan P, DO  atenolol (TENORMIN) 25 MG tablet Take 1 tablet (25 mg total) by mouth daily. 09/22/16   Johnson, Megan P, DO  atorvastatin (LIPITOR) 40 MG tablet Take 40 mg by mouth daily. 12/31/16   [provider]  clonazePAM (KLONOPIN) 0.5 MG tablet Take 1 tablet (0.5 mg total) by mouth daily as needed for anxiety. 01/05/17   Johnson, Megan P, DO  diazepam (VALIUM) 5 MG tablet Take 1-2 tablets (5-10 mg total) by mouth every 6 (six) hours as needed for muscle spasms. 01/21/17   Earnie Larsson, MD  doxycycline (VIBRAMYCIN) 100 MG capsule Take 1 capsule (100 mg total) by mouth 2 (two) times daily. 01/14/17   Orbie Pyo, MD  ezetimibe (ZETIA) 10 MG tablet take 1 tablet by mouth once daily 02/09/17   [place] Liter P, DO  FLUoxetine (PROZAC) 40 MG capsule Take 80 mg by mouth daily.    [provider]  furosemide (LASIX) 20 MG tablet take 1 tablet by mouth daily if needed for SWELLING Patient taking differently: Take 20 mg by mouth daily.  09/22/16   Johnson, Megan P, DO  hydrOXYzine (ATARAX/VISTARIL) 25 MG tablet Take 1 tablet (25 mg total) by mouth 3 (three) times daily as needed. Patient taking differently: Take 25 mg by mouth 2 (two) times daily.  11/23/16   Johnson, Megan P, DO  ibuprofen (ADVIL,MOTRIN) 600 MG tablet Take 1 tablet (600 mg total) by mouth every 6 (six) hours as needed. 02/14/17   Laban Emperor, PA-C  latanoprost (XALATAN) 0.005 % ophthalmic solution Place 1 drop into both eyes at bedtime.    [provider]  losartan (COZAAR) 100 MG tablet Take 1 tablet (100 mg total) by mouth daily. 09/22/16   Park Liter P, DO  nystatin-triamcinolone ointment (MYCOLOG) Apply 1 application topically 2 (two) times daily. Patient taking differently: Apply 1  application topically daily as needed (itching).  11/25/15   Johnson, Megan P, DO  oxyCODONE-acetaminophen (ROXICET) 5-325 MG tablet Take 1-2 tablets by mouth every 4 (four) hours as needed. 01/21/17 01/21/18  Earnie Larsson, MD  pantoprazole (PROTONIX) 40 MG tablet Take 40 mg by mouth daily.  12/31/16   [provider]  predniSONE (STERAPRED UNI-PAK 21 TAB) 10 MG (21) TBPK tablet Take 6 tablets on day 1, take 5 tablets on day 2, take 4 tablets on day 3, take 3 tablets on day 4, take 2 tablets on day 5, take 1 tablet on day 6 02/14/17   Laban Emperor, PA-C  pregabalin (LYRICA) 100 MG capsule Take 1 capsule (100 mg total) by mouth 3 (three) times daily. Patient taking differently: Take 300 mg by mouth daily.  09/22/16  Johnson, Megan P, DO  tiZANidine (ZANAFLEX) 4 MG tablet Take 1 tablet (4 mg total) by mouth at bedtime. 11/23/16   Johnson, Megan P, DO  traMADol (ULTRAM) 50 MG tablet Take 1 tablet (50 mg total) by mouth every 6 (six) hours as needed for moderate pain. 01/12/17   Johnson, Megan P, DO  valACYclovir (VALTREX) 1000 MG tablet Take 1 tablet (1,000 mg total) by mouth daily. 09/22/16   Park Liter P, DO    Allergies Abilify [aripiprazole]; Ace inhibitors; Wellbutrin [bupropion]; Azithromycin; and Erythromycin  Family History  Problem Relation Age of Onset  . Cancer Brother        melanoma  . Cancer Maternal Grandfather        prostate  . Cancer Maternal Uncle        lung    Social History Social History  Substance Use Topics  . Smoking status: Current Every Day Smoker    Packs/day: 1.50    Years: 41.00  . Smokeless tobacco: Never Used     Comment: since age 47  . Alcohol use No     Review of Systems  Constitutional: No fever/chills Cardiovascular: No chest pain. Respiratory: No SOB. Gastrointestinal: No abdominal pain.  No nausea, no vomiting.  Musculoskeletal: Positive for leg pain.  Skin: Negative for rash, abrasions, lacerations, ecchymosis. Neurological:  Negative for numbness or tingling   ____________________________________________   PHYSICAL EXAM:  VITAL SIGNS: ED Triage Vitals  Enc Vitals Group     BP 02/14/17 1310 (!) 152/97     Pulse Rate 02/14/17 1310 65     Resp 02/14/17 1310 18     Temp 02/14/17 1310 98.9 F (37.2 C)     Temp Source 02/14/17 1310 Oral     SpO2 02/14/17 1310 100 %     Weight 02/14/17 1311 220 lb (99.8 kg)     Height 02/14/17 1311 5\' 5"  (1.651 m)     Head Circumference --      Peak Flow --      Pain Score 02/14/17 1315 10     Pain Loc --      Pain Edu? --      Excl. in [place]? --      Constitutional: Alert and oriented. Well appearing and in no acute distress. Eyes: Conjunctivae are normal. PERRL. EOMI. Head: Atraumatic. ENT:      Ears:      Nose: No congestion/rhinnorhea.      Mouth/Throat: Mucous membranes are moist.  Neck: No stridor.   Cardiovascular: Normal rate, regular rhythm.  Good peripheral circulation. Respiratory: Normal respiratory effort without tachypnea or retractions. Lungs CTAB. Good air entry to the bases with no decreased or absent breath sounds. Gastrointestinal: Bowel sounds 4 quadrants. Soft and nontender to palpation. No guarding or rigidity. No palpable masses. No distention.  Musculoskeletal: Full range of motion to all extremities. No gross deformities appreciated. No tenderness to palpation over lumbar spine. No tenderness to palpation over right hip. Positive straight leg raise. No calf pain. Neurologic:  Normal speech and language. No gross focal neurologic deficits are appreciated.  Skin:  Skin is warm, dry and intact. No rash noted.   ____________________________________________   LABS (all labs ordered are listed, but only abnormal results are displayed)  Labs Reviewed - No data to display ____________________________________________  EKG   ____________________________________________  RADIOLOGY  US Venous Img Lower Unilateral Right  Result Date:  02/14/2017 CLINICAL DATA:  Right lower extremity pain and swelling. EXAM: Right LOWER  EXTREMITY VENOUS DOPPLER ULTRASOUND TECHNIQUE: Gray-scale sonography with graded compression, as well as color Doppler and duplex ultrasound were performed to evaluate the lower extremity deep venous systems from the level of the common femoral vein and including the common femoral, femoral, profunda femoral, popliteal and calf veins including the posterior tibial, peroneal and gastrocnemius veins when visible. The superficial great saphenous vein was also interrogated. Spectral Doppler was utilized to evaluate flow at rest and with distal augmentation maneuvers in the common femoral, femoral and popliteal veins. COMPARISON:  None. FINDINGS: Contralateral Common Femoral Vein: Respiratory phasicity is normal and symmetric with the symptomatic side. No evidence of thrombus. Normal compressibility. Common Femoral Vein: No evidence of thrombus. Normal compressibility, respiratory phasicity and response to augmentation. Saphenofemoral Junction: No evidence of thrombus. Normal compressibility and flow on color Doppler imaging. Profunda Femoral Vein: No evidence of thrombus. Normal compressibility and flow on color Doppler imaging. Femoral Vein: No evidence of thrombus. Normal compressibility, respiratory phasicity and response to augmentation. Popliteal Vein: No evidence of thrombus. Normal compressibility, respiratory phasicity and response to augmentation. Calf Veins: No evidence of thrombus. Normal compressibility and flow on color Doppler imaging. Superficial Great Saphenous Vein: No evidence of thrombus. Normal compressibility and flow on color Doppler imaging. Venous Reflux:  None. Other Findings:  None. IMPRESSION: No evidence of DVT within the right lower extremity. Electronically Signed   By: Marijo Conception, M.D.   On: 02/14/2017 15:30    ____________________________________________    PROCEDURES  Procedure(s)  performed:    Procedures    Medications  oxyCODONE-acetaminophen (PERCOCET/ROXICET) 5-325 MG per tablet 2 tablet (2 tablets Oral Given 02/14/17 1450)  methylPREDNISolone sodium succinate (SOLU-MEDROL) 125 mg/2 mL injection 125 mg (125 mg Intramuscular Given 02/14/17 1614)  ketorolac (TORADOL) injection 30 mg (30 mg Intramuscular Given 02/14/17 1614)     ____________________________________________   INITIAL IMPRESSION / ASSESSMENT AND PLAN / ED COURSE  Pertinent labs & imaging results that were available during my care of the patient were reviewed by me and considered in my medical decision making (see chart for details).  Review of the [place] CSRS was performed in accordance of the [place] prior to dispensing any controlled drugs.  Patient presented to the emergency department for evaluation of right leg pain. Patient had surgery in August for a lumbar fusion.  Ultrasound negative for DVT. Symptoms consistent with sciatica. She was given Solu-Medrol injection in ED. Patient will be discharged home with prescriptions for prednisone. Patient is to follow up with neurosurgery as directed. Patient is given ED precautions to return to the ED for any worsening or new symptoms.     ____________________________________________  FINAL CLINICAL IMPRESSION(S) / ED DIAGNOSES  Final diagnoses:  Pain and swelling of lower leg, right  Sciatica of right side      NEW MEDICATIONS STARTED DURING THIS VISIT:  Discharge Medication List as of 02/14/2017  4:36 PM    START taking these medications   Details  ibuprofen (ADVIL,MOTRIN) 600 MG tablet Take 1 tablet (600 mg total) by mouth every 6 (six) hours as needed., Starting Sun 02/14/2017, Print    predniSONE (STERAPRED UNI-PAK 21 TAB) 10 MG (21) TBPK tablet Take 6 tablets on day 1, take 5 tablets on day 2, take 4 tablets on day 3, take 3 tablets on day 4, take 2 tablets on day 5, take 1 tablet on day 6, Print            This chart was dictated  using voice recognition  software/Dragon. Despite best efforts to proofread, errors can occur which can change the meaning. Any change was purely unintentional.    Laban Emperor, PA-C 02/14/17 Alma Downs, MD 02/20/17 2157

## 2017-02-17 ENCOUNTER — Telehealth: Payer: Self-pay | Admitting: Family Medicine

## 2017-02-17 NOTE — Telephone Encounter (Signed)
Routing to provider  

## 2017-02-17 NOTE — Telephone Encounter (Signed)
Finished nursing visit today. Nursing goals are met. Patient is discharged from nursing visits.    BP was slightly elevated at 160/90 before taking medications at 1:00pm. Patient took medications and to call office or [facility] if BP has not come down.  Would like to make aware that pt went to ER Sunday with right hip pain. Was given Cortizone shot for possible sciatic nerve inflammation according to pt. Sent home with a prescription for pretisone taper and ibuprofen.   So far pt's pain has been controlled since then. PT may see her for one more visit tomorrow 02/18/2017.   Please Advise.  Thank you

## 2017-02-18 ENCOUNTER — Telehealth: Payer: Self-pay | Admitting: Family Medicine

## 2017-02-18 NOTE — Telephone Encounter (Signed)
Lemuel from [facility] called requesting an extension for PT for once a week for 2 weeks due to the increased pain from her fall and she has not met all of her goals. He needs a verbal order from Dr Wynetta Emery to continue the care.  Thanks  [place] [phone]

## 2017-02-18 NOTE — Telephone Encounter (Signed)
OK to give verbal.

## 2017-02-18 NOTE — Telephone Encounter (Signed)
Routing to provider  

## 2017-02-18 NOTE — Telephone Encounter (Signed)
Called and left [facility] a VM asking for him to please return my call.

## 2017-02-22 NOTE — Telephone Encounter (Signed)
Spoke with Hannah Kaiser from [facility] to give verbal ok per Dr Wynetta Emery for continued care for patient.

## 2017-03-09 ENCOUNTER — Other Ambulatory Visit: Payer: Self-pay

## 2017-03-10 MED ORDER — TIZANIDINE HCL 4 MG PO TABS: 4.0000 mg | ORAL_TABLET | Freq: Every day | ORAL | 2 refills | Status: DC

## 2017-03-22 ENCOUNTER — Telehealth: Payer: Self-pay

## 2017-03-22 MED ORDER — IBUPROFEN 600 MG PO TABS: 600.0000 mg | ORAL_TABLET | Freq: Four times a day (QID) | ORAL | 3 refills | Status: DC | PRN

## 2017-03-22 NOTE — Telephone Encounter (Signed)
Pt requesting ibuprofen 800 mg. Please advise.

## 2017-04-06 ENCOUNTER — Other Ambulatory Visit: Payer: Self-pay

## 2017-04-06 MED ORDER — IBUPROFEN 600 MG PO TABS: 600.0000 mg | ORAL_TABLET | Freq: Four times a day (QID) | ORAL | 3 refills | Status: DC | PRN

## 2017-04-16 ENCOUNTER — Other Ambulatory Visit: Payer: Self-pay | Admitting: Family Medicine

## 2017-04-28 ENCOUNTER — Other Ambulatory Visit: Payer: Self-pay | Admitting: Family Medicine

## 2017-05-14 ENCOUNTER — Other Ambulatory Visit: Payer: Self-pay | Admitting: Family Medicine

## 2017-05-14 MED ORDER — CLONAZEPAM 0.5 MG PO TABS: 0.5000 mg | ORAL_TABLET | Freq: Every day | ORAL | 0 refills | Status: DC | PRN

## 2017-05-14 NOTE — Telephone Encounter (Signed)
Needs a refill on klonopin. Rx written and good to call in.

## 2017-05-18 NOTE — Telephone Encounter (Signed)
RX called into pharmacy

## 2017-05-26 ENCOUNTER — Telehealth: Payer: Self-pay | Admitting: Family Medicine

## 2017-05-26 ENCOUNTER — Ambulatory Visit
Admission: RE | Admit: 2017-05-26 | Discharge: 2017-05-26 | Disposition: A | Discharge status: Home / Self Care | Payer: BLUE CROSS/BLUE SHIELD | Source: Ambulatory Visit | Attending: Family Medicine | Admitting: Family Medicine

## 2017-05-26 ENCOUNTER — Encounter: Payer: Self-pay | Admitting: Family Medicine

## 2017-05-26 ENCOUNTER — Ambulatory Visit: Payer: BLUE CROSS/BLUE SHIELD | Admitting: Family Medicine

## 2017-05-26 VITALS — BP 153/81 | HR 66 | Temp 97.7°F | Wt 223.0 lb

## 2017-05-26 DIAGNOSIS — L249 Irritant contact dermatitis, unspecified cause: Secondary | ICD-10-CM | POA: Diagnosis not present

## 2017-05-26 DIAGNOSIS — I700 Atherosclerosis of aorta: Secondary | ICD-10-CM | POA: Insufficient documentation

## 2017-05-26 DIAGNOSIS — K7689 Other specified diseases of liver: Secondary | ICD-10-CM | POA: Diagnosis not present

## 2017-05-26 DIAGNOSIS — R1031 Right lower quadrant pain: Secondary | ICD-10-CM | POA: Diagnosis not present

## 2017-05-26 LAB — UA/M W/RFLX CULTURE, ROUTINE
Bilirubin, UA: NEGATIVE
GLUCOSE, UA: NEGATIVE
KETONES UA: NEGATIVE
LEUKOCYTES UA: NEGATIVE
Nitrite, UA: NEGATIVE
PROTEIN UA: NEGATIVE
RBC, UA: NEGATIVE
Specific Gravity, UA: 1.01 (ref 1.005–1.030)
UUROB: 0.2 mg/dL (ref 0.2–1.0)
pH, UA: 6.5 (ref 5.0–7.5)

## 2017-05-26 MED ORDER — TRIAMCINOLONE ACETONIDE 0.5 % EX OINT: 1.0000 "application " | TOPICAL_OINTMENT | Freq: Two times a day (BID) | CUTANEOUS | 0 refills | Status: DC

## 2017-05-26 MED ORDER — IOPAMIDOL (ISOVUE-300) INJECTION 61%
100.0000 mL | Freq: Once | INTRAVENOUS | Status: AC | PRN
Start: 2017-05-26 — End: 2017-05-26
  Administered 2017-05-26: 100 mL via INTRAVENOUS

## 2017-05-26 NOTE — Telephone Encounter (Signed)
CT authorized. Kylie notified.

## 2017-05-26 NOTE — Progress Notes (Signed)
BP (!) 153/81   Pulse 66   Temp 97.7 F (36.5 C) (Oral)   Wt 223 lb (101.2 kg)   SpO2 97%   BMI 37.11 kg/m    Subjective:    Patient ID: Hannah Kaiser, female    DOB: [birth year], 56 y.o.   MRN: [id number]  HPI: Hannah Kaiser is a 56 y.o. female  Chief Complaint  Patient presents with  . Abdominal Pain   ABDOMINAL PAIN- starting off coming and going, now sticking around  Duration: 2 days Onset: gradual Severity: severe Quality: aching pain Location:  RLQ  Episode duration: now constant Radiation: no Frequency: constant Alleviating factors:  Aggravating factors: Status: worse Treatments attempted: tramadol, pepto bismol Fever: no Nausea: yes Vomiting: no Weight loss: no Decreased appetite: no Diarrhea: no Constipation: yes Blood in stool: no Heartburn: no Jaundice: no Rash: yes Dysuria/urinary frequency: no Hematuria: no History of sexually transmitted disease: no Recurrent NSAID use: no  RASH Duration:  weeks  Location: arms  Itching: yes Burning: yes Redness: yes Oozing: no Scaling: yes Blisters: no Painful: no Fevers: no Change in detergents/soaps/personal care products: no Recent illness: no Recent travel:no History of same: no Context: worse Alleviating factors: nothing Treatments attempted:lotion/moisturizer Shortness of breath: no  Throat/tongue swelling: no Myalgias/arthralgias: no   Relevant past medical, surgical, family and social history reviewed and updated as indicated. Interim medical history since our last visit reviewed. Allergies and medications reviewed and updated.  Review of Systems  Constitutional: Negative.   Respiratory: Negative.   Cardiovascular: Negative.   Gastrointestinal: Positive for abdominal distention, abdominal pain and constipation. Negative for anal bleeding, blood in stool, diarrhea, nausea, rectal pain and vomiting.  Genitourinary: Negative.   Skin: Negative.   Psychiatric/Behavioral: Negative.      Per HPI unless specifically indicated above     Objective:    BP (!) 153/81   Pulse 66   Temp 97.7 F (36.5 C) (Oral)   Wt 223 lb (101.2 kg)   SpO2 97%   BMI 37.11 kg/m   Wt Readings from Last 3 Encounters:  05/26/17 223 lb (101.2 kg)  02/14/17 220 lb (99.8 kg)  01/14/17 223 lb (101.2 kg)    Physical Exam  Constitutional: She is oriented to person, place, and time. She appears well-developed and well-nourished. No distress.  HENT:  Head: Normocephalic and atraumatic.  Right Ear: Hearing normal.  Left Ear: Hearing normal.  Nose: Nose normal.  Eyes: Conjunctivae and lids are normal. Right eye exhibits no discharge. Left eye exhibits no discharge. No scleral icterus.  Cardiovascular: Normal rate, regular rhythm, normal heart sounds and intact distal pulses. Exam reveals no gallop and no friction rub.  No murmur heard. Pulmonary/Chest: Effort normal and breath sounds normal. No respiratory distress. She has no wheezes. She has no rales. She exhibits no tenderness.  Abdominal: Soft. Bowel sounds are normal. She exhibits no distension and no mass. There is no hepatosplenomegaly, splenomegaly or hepatomegaly. There is tenderness in the right lower quadrant. There is guarding and CVA tenderness. There is no rebound. No hernia. Hernia confirmed negative in the ventral area, confirmed negative in the right inguinal area and confirmed negative in the left inguinal area.  + psoas sign on the R  Musculoskeletal: Normal range of motion.  Neurological: She is alert and oriented to person, place, and time.  Skin: Skin is warm, dry and intact. Rash (excoriated rash on arms bilaterally) noted. She is not diaphoretic. No erythema. No pallor.  Psychiatric:  She has a normal mood and affect. Her speech is normal and behavior is normal. Judgment and thought content normal. Cognition and memory are normal.  Nursing note and vitals reviewed.   Results for orders placed or performed during the  hospital encounter of 15/05/69  Basic metabolic panel  Result Value Ref Range   Sodium 142 135 - 145 mmol/L   Potassium 4.1 3.5 - 5.1 mmol/L   Chloride 109 101 - 111 mmol/L   CO2 24 22 - 32 mmol/L   Glucose, Bld 95 65 - 99 mg/dL   BUN 12 6 - 20 mg/dL   Creatinine, Ser 0.90 0.44 - 1.00 mg/dL   Calcium 9.1 8.9 - 10.3 mg/dL   GFR calc non Af Amer >60 >60 mL/min   GFR calc Af Amer >60 >60 mL/min   Anion gap 9 5 - 15  CBC  Result Value Ref Range   WBC 6.2 3.6 - 11.0 K/uL   RBC 4.23 3.80 - 5.20 MIL/uL   Hemoglobin 13.1 12.0 - 16.0 g/dL   HCT 38.5 35.0 - 47.0 %   MCV 91.0 80.0 - 100.0 fL   MCH 31.0 26.0 - 34.0 pg   MCHC 34.0 32.0 - 36.0 g/dL   RDW 13.5 11.5 - 14.5 %   Platelets 205 150 - 440 K/uL  Troponin I  Result Value Ref Range   Troponin I <0.03 <0.03 ng/mL  Troponin I  Result Value Ref Range   Troponin I <0.03 <0.03 ng/mL      Assessment & Plan:   Problem List Items Addressed This Visit    None    Visit Diagnoses    RLQ abdominal pain    -  Primary   CBC and UA normal, but concern for ovarian cyst/kidney stone or appy- will obtain stat CT of her abdomen. Await results.    Relevant Orders   CBC With Differential/Platelet   Comprehensive metabolic panel   UA/M w/rflx Culture, Routine   Right lower quadrant abdominal pain       Relevant Orders   CT Abdomen Pelvis W Contrast   Irritant contact dermatitis, unspecified trigger       Will treat with triamcinalone and vasaline. Call with any concerns or if not getting better.        Follow up plan: Return Pending results.

## 2017-05-26 NOTE — Telephone Encounter (Signed)
Copied from [place]. Topic: Quick Communication - See Telephone Encounter >> May 26, 2017 12:17 PM Cleaster Corin, NT wrote: CRM for notification. See Telephone encounter for:   05/26/17. Kylie from [facility] pre service center about pt. Coming in today at 2:30 and pre cer. For insurance [phone]

## 2017-05-27 ENCOUNTER — Telehealth: Payer: Self-pay | Admitting: Family Medicine

## 2017-05-27 LAB — COMPREHENSIVE METABOLIC PANEL
A/G RATIO: 2 (ref 1.2–2.2)
ALBUMIN: 4.6 g/dL (ref 3.5–5.5)
ALK PHOS: 72 IU/L (ref 39–117)
ALT: 11 IU/L (ref 0–32)
AST: 18 IU/L (ref 0–40)
BILIRUBIN TOTAL: 0.8 mg/dL (ref 0.0–1.2)
BUN / CREAT RATIO: 15 (ref 9–23)
BUN: 14 mg/dL (ref 6–24)
CHLORIDE: 107 mmol/L — AB (ref 96–106)
CO2: 21 mmol/L (ref 20–29)
Calcium: 9.7 mg/dL (ref 8.7–10.2)
Creatinine, Ser: 0.91 mg/dL (ref 0.57–1.00)
GFR calc Af Amer: 83 mL/min/{1.73_m2} (ref >59)
GFR calc non Af Amer: 72 mL/min/{1.73_m2} (ref >59)
GLOBULIN, TOTAL: 2.3 g/dL (ref 1.5–4.5)
Glucose: 80 mg/dL (ref 65–99)
POTASSIUM: 4.5 mmol/L (ref 3.5–5.2)
Sodium: 145 mmol/L — ABNORMAL HIGH (ref 134–144)
Total Protein: 6.9 g/dL (ref 6.0–8.5)

## 2017-05-27 NOTE — Telephone Encounter (Signed)
Spoke to patient regarding results. Normal CT of abdomen. Likely constipation. Will start miralax BID and call if not getting better or getting worse.

## 2017-06-03 LAB — CBC WITH DIFFERENTIAL/PLATELET
Hematocrit: 34 % (ref 34.0–46.6)
Hemoglobin: 11.3 g/dL (ref 11.1–15.9)
LYMPHS: 33 %
Lymphocytes Absolute: 1.6 10*3/uL (ref 0.7–3.1)
MCH: 27.9 pg (ref 26.6–33.0)
MCHC: 33.2 g/dL (ref 31.5–35.7)
MCV: 84 fL (ref 79–97)
MID (ABSOLUTE): 0.5 10*3/uL (ref 0.1–1.6)
MID: 10 %
NEUTROS PCT: 57 %
Neutrophils Absolute: 2.7 10*3/uL (ref 1.4–7.0)
PLATELETS: 243 10*3/uL (ref 150–379)
RBC: 4.05 x10E6/uL (ref 3.77–5.28)
RDW: 16.1 % — AB (ref 12.3–15.4)
WBC: 4.8 10*3/uL (ref 3.4–10.8)

## 2017-06-04 ENCOUNTER — Other Ambulatory Visit: Payer: Self-pay | Admitting: Family Medicine

## 2017-06-07 ENCOUNTER — Other Ambulatory Visit: Payer: Self-pay | Admitting: Family Medicine

## 2017-07-19 ENCOUNTER — Telehealth: Payer: Self-pay | Admitting: Family Medicine

## 2017-07-19 MED ORDER — OSELTAMIVIR PHOSPHATE 75 MG PO CAPS: 75.0000 mg | ORAL_CAPSULE | Freq: Every day | ORAL | 0 refills | Status: DC

## 2017-07-19 NOTE — Telephone Encounter (Signed)
Granddaughter tested positive for the flu. She watches her. Prophylaxis called in.

## 2017-07-20 ENCOUNTER — Other Ambulatory Visit: Payer: Self-pay | Admitting: Family Medicine

## 2017-11-05 ENCOUNTER — Other Ambulatory Visit: Payer: Self-pay

## 2017-11-05 MED ORDER — CLONAZEPAM 0.5 MG PO TABS: 0.5000 mg | ORAL_TABLET | Freq: Every day | ORAL | 0 refills | Status: DC | PRN

## 2017-11-05 NOTE — Telephone Encounter (Signed)
RX called in to [facility]

## 2017-12-03 ENCOUNTER — Ambulatory Visit (INDEPENDENT_AMBULATORY_CARE_PROVIDER_SITE_OTHER): Payer: BLUE CROSS/BLUE SHIELD | Admitting: Unknown Physician Specialty

## 2017-12-03 ENCOUNTER — Encounter: Payer: Self-pay | Admitting: Unknown Physician Specialty

## 2017-12-03 VITALS — BP 166/101 | HR 74 | Wt 221.0 lb

## 2017-12-03 DIAGNOSIS — Z636 Dependent relative needing care at home: Secondary | ICD-10-CM

## 2017-12-03 DIAGNOSIS — I129 Hypertensive chronic kidney disease with stage 1 through stage 4 chronic kidney disease, or unspecified chronic kidney disease: Secondary | ICD-10-CM

## 2017-12-03 DIAGNOSIS — F5089 Other specified eating disorder: Secondary | ICD-10-CM

## 2017-12-03 DIAGNOSIS — E782 Mixed hyperlipidemia: Secondary | ICD-10-CM | POA: Diagnosis not present

## 2017-12-03 DIAGNOSIS — L509 Urticaria, unspecified: Secondary | ICD-10-CM | POA: Diagnosis not present

## 2017-12-03 LAB — CBC WITH DIFFERENTIAL/PLATELET
HEMATOCRIT: 34.1 % (ref 34.0–46.6)
HEMOGLOBIN: 11.1 g/dL (ref 11.1–15.9)
Lymphocytes Absolute: 1.4 10*3/uL (ref 0.7–3.1)
Lymphs: 39 %
MCH: 26 pg — AB (ref 26.6–33.0)
MCHC: 32.6 g/dL (ref 31.5–35.7)
MCV: 80 fL (ref 79–97)
MID (Absolute): 0.5 10*3/uL (ref 0.1–1.6)
MID: 13 %
NEUTROS PCT: 48 %
Neutrophils Absolute: 1.8 10*3/uL (ref 1.4–7.0)
Platelets: 226 10*3/uL (ref 150–450)
RBC: 4.27 x10E6/uL (ref 3.77–5.28)
RDW: 17.4 % — ABNORMAL HIGH (ref 12.3–15.4)
WBC: 3.7 10*3/uL (ref 3.4–10.8)

## 2017-12-03 MED ORDER — BUSPIRONE HCL 5 MG PO TABS: 5.0000 mg | ORAL_TABLET | Freq: Three times a day (TID) | ORAL | 1 refills | Status: DC

## 2017-12-03 MED ORDER — BETAMETHASONE DIPROPIONATE AUG 0.05 % EX CREA: TOPICAL_CREAM | Freq: Two times a day (BID) | CUTANEOUS | 0 refills | Status: DC

## 2017-12-03 NOTE — Assessment & Plan Note (Signed)
Discussed strategies to handle stress.  She will ask for hospice social worker to come over and discuss ways to manage mom's care.  Add Buspar 5 mg TID.

## 2017-12-03 NOTE — Progress Notes (Signed)
BP (!) 166/101   Pulse 74   Wt 221 lb (100.2 kg)   SpO2 97%   BMI 36.78 kg/m    Subjective:    Patient ID: Hannah Kaiser, female    DOB: 19-Sep-1960, 57 y.o.   MRN: [id number]  HPI: Hannah Kaiser is a 57 y.o. female  Chief Complaint  Patient presents with  . Anemia    Possible anemia. Pt states shes been craving ice x 4 months   ? Anemia: Pt states she is chewing almonds and ice.  Smoking more.  She is having a great deal of anxiety with care for mother, brother, and worries about daughter and grandchildren.  She admits ot fatigue.  Having to wake up twice at night to help her mom.  Beginning to have back spasms with some of the caregiving responsibilities.      BP is high.  Did not take medications this AM.  Denies SOB.    Relevant past medical, surgical, family and social history reviewed and updated as indicated. Interim medical history since our last visit reviewed. Allergies and medications reviewed and updated.  Review of Systems  Constitutional: Positive for fatigue.  HENT: Negative.   Respiratory: Negative.   Cardiovascular: Negative for chest pain.  Gastrointestinal: Negative.   Skin:       Urticaria.  Gets bites on arms and scratches which increases the irritation.    Psychiatric/Behavioral: Negative.     Per HPI unless specifically indicated above     Objective:    BP (!) 166/101   Pulse 74   Wt 221 lb (100.2 kg)   SpO2 97%   BMI 36.78 kg/m   Wt Readings from Last 3 Encounters:  12/03/17 221 lb (100.2 kg)  05/26/17 223 lb (101.2 kg)  02/14/17 220 lb (99.8 kg)    Physical Exam  Constitutional: She is oriented to person, place, and time. She appears well-developed and well-nourished. No distress.  HENT:  Head: Normocephalic and atraumatic.  Eyes: Conjunctivae and lids are normal. Right eye exhibits no discharge. Left eye exhibits no discharge. No scleral icterus.  Neck: Normal range of motion. Neck supple. No JVD present. Carotid bruit is not  present.  Cardiovascular: Normal rate, regular rhythm and normal heart sounds.  Pulmonary/Chest: Effort normal and breath sounds normal.  Abdominal: Normal appearance. There is no splenomegaly or hepatomegaly.  Musculoskeletal: Normal range of motion.  Neurological: She is alert and oriented to person, place, and time.  Skin: Skin is warm, dry and intact. No rash noted. No pallor.  Excoriated areas bilateral arms.    Psychiatric: She has a normal mood and affect. Her behavior is normal. Judgment and thought content normal.    Results for orders placed or performed in visit on 12/03/17  CBC With Differential/Platelet  Result Value Ref Range   WBC 3.7 3.4 - 10.8 x10E3/uL   RBC 4.27 3.77 - 5.28 x10E6/uL   Hemoglobin 11.1 11.1 - 15.9 g/dL   Hematocrit 34.1 34.0 - 46.6 %   MCV 80 79 - 97 fL   MCH 26.0 (L) 26.6 - 33.0 pg   MCHC 32.6 31.5 - 35.7 g/dL   RDW 17.4 (H) 12.3 - 15.4 %   Platelets 226 150 - 450 x10E3/uL   Neutrophils 48 Not Estab. %   Lymphs 39 Not Estab. %   MID 13 Not Estab. %   Neutrophils Absolute 1.8 1.4 - 7.0 x10E3/uL   Lymphocytes Absolute 1.4 0.7 - 3.1 x10E3/uL  MID (Absolute) 0.5 0.1 - 1.6 X10E3/uL      Assessment & Plan:   Problem List Items Addressed This Visit      Unprioritized   Benign hypertensive renal disease    BP elevated but did not take meds this AM.  Follow BP next visit      Relevant Orders   Comprehensive metabolic panel   Caregiver stress    Discussed strategies to handle stress.  She will ask for hospice social worker to come over and discuss ways to manage mom's care.  Add Buspar 5 mg TID.        Hyperlipidemia   Relevant Orders   Lipid Panel w/o Chol/HDL Ratio    Other Visit Diagnoses    Pica    -  Primary   H/H are normal.  suspect symptoms related to anxiety and stress   Relevant Orders   CBC With Differential/Platelet (Completed)   Urticaria       Continue hydroxyzine as needed.  Add Diporlene cream to take prn.  Can't take  Doxepin due to sedation.  Suspect stress contributing   Relevant Orders   Alpha Gal IgE       Follow up plan: Return for 2-3 weeks with Dr. Wynetta Emery.

## 2017-12-03 NOTE — Assessment & Plan Note (Addendum)
BP elevated but did not take meds this AM.  Follow BP next visit

## 2017-12-06 NOTE — Progress Notes (Signed)
Normal labs.  Pt notified through mychart

## 2017-12-08 LAB — LIPID PANEL W/O CHOL/HDL RATIO
CHOLESTEROL TOTAL: 174 mg/dL (ref 100–199)
HDL: 53 mg/dL (ref >39)
LDL Calculated: 103 mg/dL — ABNORMAL HIGH (ref 0–99)
TRIGLYCERIDES: 92 mg/dL (ref 0–149)
VLDL CHOLESTEROL CAL: 18 mg/dL (ref 5–40)

## 2017-12-08 LAB — COMPREHENSIVE METABOLIC PANEL
ALK PHOS: 58 IU/L (ref 39–117)
ALT: 11 IU/L (ref 0–32)
AST: 14 IU/L (ref 0–40)
Albumin/Globulin Ratio: 2.4 — ABNORMAL HIGH (ref 1.2–2.2)
Albumin: 4.4 g/dL (ref 3.5–5.5)
BILIRUBIN TOTAL: 0.6 mg/dL (ref 0.0–1.2)
BUN/Creatinine Ratio: 12 (ref 9–23)
BUN: 10 mg/dL (ref 6–24)
CHLORIDE: 108 mmol/L — AB (ref 96–106)
CO2: 21 mmol/L (ref 20–29)
Calcium: 9.2 mg/dL (ref 8.7–10.2)
Creatinine, Ser: 0.84 mg/dL (ref 0.57–1.00)
GFR calc non Af Amer: 78 mL/min/{1.73_m2} (ref >59)
GFR, EST AFRICAN AMERICAN: 90 mL/min/{1.73_m2} (ref >59)
GLUCOSE: 87 mg/dL (ref 65–99)
Globulin, Total: 1.8 g/dL (ref 1.5–4.5)
Potassium: 4.6 mmol/L (ref 3.5–5.2)
Sodium: 142 mmol/L (ref 134–144)
TOTAL PROTEIN: 6.2 g/dL (ref 6.0–8.5)

## 2017-12-08 LAB — ALPHA GAL IGE: Alpha Gal IgE*: <0.1 kU/L (ref <0.10)

## 2017-12-17 ENCOUNTER — Telehealth: Payer: Self-pay | Admitting: Family Medicine

## 2017-12-17 NOTE — Telephone Encounter (Signed)
Mother passed this morning. Not doing well. Will increase her klonopin to 2 tabs daily PRN. Call with any concerns.

## 2017-12-24 ENCOUNTER — Other Ambulatory Visit: Payer: Self-pay

## 2017-12-24 MED ORDER — PANTOPRAZOLE SODIUM 40 MG PO TBEC: 40.0000 mg | DELAYED_RELEASE_TABLET | Freq: Every day | ORAL | 1 refills | Status: DC

## 2017-12-28 NOTE — Progress Notes (Deleted)
There were no vitals taken for this visit.   Subjective:    Patient ID: Hannah Kaiser, female    DOB: July 14, 1960, 57 y.o.   MRN: [id number]  HPI: Hannah Kaiser is a 57 y.o. female  No chief complaint on file.  HYPERTENSION Hypertension status: {Blank single:19197::"controlled","uncontrolled","better","worse","exacerbated","stable"}  Satisfied with current treatment? {Blank single:19197::"yes","no"} Duration of hypertension: {Blank single:19197::"chronic","months","years"} BP monitoring frequency:  {Blank single:19197::"not checking","rarely","daily","weekly","monthly","a few times a day","a few times a week","a few times a month"} BP range:  BP medication side effects:  {Blank single:19197::"yes","no"} Medication compliance: {Blank single:19197::"excellent compliance","good compliance","fair compliance","poor compliance"} Previous BP meds:{Blank UXLKGMWN:02725::"DGUY","QIHKVQQVZD","GLOVFIEPPI/RJJOACZYSA","YTKZSWFU","XNATFTDDUK","GURKYHCWCB/JSEG","BTDVVOHYWV (bystolic)","carvedilol","chlorthalidone","clonidine","diltiazem","exforge HCT","HCTZ","irbesartan (avapro)","labetalol","lisinopril","lisinopril-HCTZ","losartan (cozaar)","methyldopa","nifedipine","olmesartan (benicar)","olmesartan-HCTZ","quinapril","ramipril","spironalactone","tekturna","valsartan","valsartan-HCTZ","verapamil"} Aspirin: {Blank single:19197::"yes","no"} Recurrent headaches: {Blank single:19197::"yes","no"} Visual changes: {Blank single:19197::"yes","no"} Palpitations: {Blank single:19197::"yes","no"} Dyspnea: {Blank single:19197::"yes","no"} Chest pain: {Blank single:19197::"yes","no"} Lower extremity edema: {Blank single:19197::"yes","no"} Dizzy/lightheaded: {Blank single:19197::"yes","no"}  ANXIETY/STRESS Duration:{Blank single:19197::"controlled","uncontrolled","better","worse","exacerbated","stable"} Anxious mood: {Blank single:19197::"yes","no"}  Excessive worrying: {Blank  single:19197::"yes","no"} Irritability: {Blank single:19197::"yes","no"}  Sweating: {Blank single:19197::"yes","no"} Nausea: {Blank single:19197::"yes","no"} Palpitations:{Blank single:19197::"yes","no"} Hyperventilation: {Blank single:19197::"yes","no"} Panic attacks: {Blank single:19197::"yes","no"} Agoraphobia: {Blank single:19197::"yes","no"}  Obscessions/compulsions: {Blank single:19197::"yes","no"} Depressed mood: {Blank single:19197::"yes","no"} Depression screen [facility] 2/9 01/05/2017 09/22/2016 03/27/2016 02/24/2016 12/03/2015  Decreased Interest 3 3 3 1 3   Down, Depressed, Hopeless 2 3 3 2 3   PHQ - 2 Score 5 6 6 3 6   Altered sleeping 3 3 3 3 3   Tired, decreased energy 3 3 3 3 3   Change in appetite 3 3 3 1 3   Feeling bad or failure about yourself  2 3 3 2 3   Trouble concentrating 3 3 2 3 3   Moving slowly or fidgety/restless 3 3 3 1 3   Suicidal thoughts 0 3 3 0 0  PHQ-9 Score 22 27 26 16 24   Difficult doing work/chores Somewhat difficult - - - Very difficult   Anhedonia: {Blank single:19197::"yes","no"} Weight changes: {Blank single:19197::"yes","no"} Insomnia: {Blank single:19197::"yes","no"} {Blank single:19197::"hard to fall asleep","hard to stay asleep"}  Hypersomnia: {Blank single:19197::"yes","no"} Fatigue/loss of energy: {Blank single:19197::"yes","no"} Feelings of worthlessness: {Blank single:19197::"yes","no"} Feelings of guilt: {Blank single:19197::"yes","no"} Impaired concentration/indecisiveness: {Blank single:19197::"yes","no"} Suicidal ideations: {Blank single:19197::"yes","no"}  Crying spells: {Blank single:19197::"yes","no"} Recent Stressors/Life Changes: {Blank single:19197::"yes","no"}   Relationship problems: {Blank single:19197::"yes","no"}   Family stress: {Blank single:19197::"yes","no"}     Financial stress: {Blank single:19197::"yes","no"}    Job stress: {Blank single:19197::"yes","no"}    Recent death/loss: {Blank single:19197::"yes","no"}   Relevant  past medical, surgical, family and social history reviewed and updated as indicated. Interim medical history since our last visit reviewed. Allergies and medications reviewed and updated.  Review of Systems  Per HPI unless specifically indicated above     Objective:    There were no vitals taken for this visit.  Wt Readings from Last 3 Encounters:  12/03/17 221 lb (100.2 kg)  05/26/17 223 lb (101.2 kg)  02/14/17 220 lb (99.8 kg)    Physical Exam  Results for orders placed or performed in visit on 12/03/17  Comprehensive metabolic panel  Result Value Ref Range   Glucose 87 65 - 99 mg/dL   BUN 10 6 - 24 mg/dL   Creatinine, Ser 0.84 0.57 - 1.00 mg/dL   GFR calc non Af Amer 78 >59 mL/min/1.73   GFR calc Af Amer 90 >59 mL/min/1.73   BUN/Creatinine Ratio 12 9 - 23   Sodium 142 134 - 144 mmol/L   Potassium 4.6 3.5 - 5.2 mmol/L   Chloride 108 (H) 96 - 106 mmol/L   CO2 21 20 - 29 mmol/L   Calcium 9.2 8.7 - 10.2 mg/dL   Total Protein  6.2 6.0 - 8.5 g/dL   Albumin 4.4 3.5 - 5.5 g/dL   Globulin, Total 1.8 1.5 - 4.5 g/dL   Albumin/Globulin Ratio 2.4 (H) 1.2 - 2.2   Bilirubin Total 0.6 0.0 - 1.2 mg/dL   Alkaline Phosphatase 58 39 - 117 IU/L   AST 14 0 - 40 IU/L   ALT 11 0 - 32 IU/L  Lipid Panel w/o Chol/HDL Ratio  Result Value Ref Range   Cholesterol, Total 174 100 - 199 mg/dL   Triglycerides 92 0 - 149 mg/dL   HDL 53 >39 mg/dL   VLDL Cholesterol Cal 18 5 - 40 mg/dL   LDL Calculated 103 (H) 0 - 99 mg/dL  CBC With Differential/Platelet  Result Value Ref Range   WBC 3.7 3.4 - 10.8 x10E3/uL   RBC 4.27 3.77 - 5.28 x10E6/uL   Hemoglobin 11.1 11.1 - 15.9 g/dL   Hematocrit 34.1 34.0 - 46.6 %   MCV 80 79 - 97 fL   MCH 26.0 (L) 26.6 - 33.0 pg   MCHC 32.6 31.5 - 35.7 g/dL   RDW 17.4 (H) 12.3 - 15.4 %   Platelets 226 150 - 450 x10E3/uL   Neutrophils 48 Not Estab. %   Lymphs 39 Not Estab. %   MID 13 Not Estab. %   Neutrophils Absolute 1.8 1.4 - 7.0 x10E3/uL   Lymphocytes Absolute  1.4 0.7 - 3.1 x10E3/uL   MID (Absolute) 0.5 0.1 - 1.6 X10E3/uL  Alpha Gal IgE  Result Value Ref Range   Alpha Gal IgE* <0.10 <0.10 kU/L      Assessment & Plan:   Problem List Items Addressed This Visit      Genitourinary   Benign hypertensive renal disease - Primary       Follow up plan: No follow-ups on file.

## 2017-12-29 ENCOUNTER — Ambulatory Visit (INDEPENDENT_AMBULATORY_CARE_PROVIDER_SITE_OTHER): Payer: BLUE CROSS/BLUE SHIELD | Admitting: Family Medicine

## 2017-12-29 ENCOUNTER — Encounter: Payer: Self-pay | Admitting: Family Medicine

## 2017-12-29 ENCOUNTER — Ambulatory Visit: Payer: BLUE CROSS/BLUE SHIELD | Admitting: Family Medicine

## 2017-12-29 VITALS — BP 157/83 | HR 80

## 2017-12-29 DIAGNOSIS — I129 Hypertensive chronic kidney disease with stage 1 through stage 4 chronic kidney disease, or unspecified chronic kidney disease: Secondary | ICD-10-CM | POA: Diagnosis not present

## 2017-12-29 DIAGNOSIS — F332 Major depressive disorder, recurrent severe without psychotic features: Secondary | ICD-10-CM | POA: Diagnosis not present

## 2017-12-29 DIAGNOSIS — K219 Gastro-esophageal reflux disease without esophagitis: Secondary | ICD-10-CM | POA: Diagnosis not present

## 2017-12-29 DIAGNOSIS — K2970 Gastritis, unspecified, without bleeding: Secondary | ICD-10-CM

## 2017-12-29 MED ORDER — HYDROCHLOROTHIAZIDE 25 MG PO TABS: 25.0000 mg | ORAL_TABLET | Freq: Every day | ORAL | 3 refills | Status: DC

## 2017-12-29 MED ORDER — PANTOPRAZOLE SODIUM 40 MG PO TBEC: 40.0000 mg | DELAYED_RELEASE_TABLET | Freq: Every day | ORAL | 1 refills | Status: DC

## 2017-12-29 MED ORDER — CLONAZEPAM 1 MG PO TABS: 1.0000 mg | ORAL_TABLET | Freq: Two times a day (BID) | ORAL | 0 refills | Status: DC

## 2017-12-29 MED ORDER — QUETIAPINE FUMARATE 25 MG PO TABS: 25.0000 mg | ORAL_TABLET | Freq: Every day | ORAL | 3 refills | Status: DC

## 2017-12-29 NOTE — Assessment & Plan Note (Signed)
In acute exacerbation due to the loss of her mother. Will continue prozac and buspar and hydroxyzine. Will add seroquel at night. Will increase klonopin to 1mg  BID PRN for 1 month. Recheck 1 month. Call with any concerns.

## 2017-12-29 NOTE — Progress Notes (Signed)
BP (!) 157/83 (BP Location: Left Arm, Patient Position: Sitting, Cuff Size: Normal)   Pulse 80   SpO2 98%    Subjective:    Patient ID: Hannah Kaiser Filler, female    DOB: [birth year], 57 y.o.   MRN: [id number]  HPI: Hannah Kaiser is a 57 y.o. female  Chief Complaint  Patient presents with  . Hypertension  . Anxiety   HYPERTENSION Hypertension status: uncontrolled  Satisfied with current treatment? no Duration of hypertension: chronic BP monitoring frequency:  rarely BP medication side effects:  no Medication compliance: excellent compliance Aspirin: no Recurrent headaches: no Visual changes: no Palpitations: no Dyspnea: no Chest pain: no Lower extremity edema: no Dizzy/lightheaded: no  ANXIETY/STRESS- just lost her mom about a week ago. Has been grieving. Has increased her klonopin with permission, but not doing great. Feels exacerbated.  Duration:exacerbated Anxious mood: yes  Excessive worrying: yes Irritability: yes  Sweating: no Nausea: no Palpitations:no Hyperventilation: no Panic attacks: no Agoraphobia: no  Obscessions/compulsions: yes Depressed mood: yes Depression screen [facility] 2/9 01/05/2017 09/22/2016 03/27/2016 02/24/2016 12/03/2015  Decreased Interest 3 3 3 1 3   Down, Depressed, Hopeless 2 3 3 2 3   PHQ - 2 Score 5 6 6 3 6   Altered sleeping 3 3 3 3 3   Tired, decreased energy 3 3 3 3 3   Change in appetite 3 3 3 1 3   Feeling bad or failure about yourself  2 3 3 2 3   Trouble concentrating 3 3 2 3 3   Moving slowly or fidgety/restless 3 3 3 1 3   Suicidal thoughts 0 3 3 0 0  PHQ-9 Score 22 27 26 16 24   Difficult doing work/chores Somewhat difficult - - - Very difficult   Anhedonia: no Weight changes: no Insomnia: yes hard to fall asleep  Hypersomnia: yes Fatigue/loss of energy: yes Feelings of worthlessness: yes Feelings of guilt: yes Impaired concentration/indecisiveness: yes Suicidal ideations: no  Crying spells: yes Recent Stressors/Life Changes: yes  Relationship problems: no   Family stress: yes     Financial stress: yes    Job stress: yes    Recent death/loss: yes  Relevant past medical, surgical, family and social history reviewed and updated as indicated. Interim medical history since our last visit reviewed. Allergies and medications reviewed and updated.  Review of Systems  Constitutional: Negative.   Respiratory: Negative.   Cardiovascular: Negative.   Musculoskeletal: Negative.   Skin: Negative.   Neurological: Negative.   Psychiatric/Behavioral: Positive for agitation, dysphoric mood and sleep disturbance. Negative for behavioral problems, confusion, decreased concentration, hallucinations, self-injury and suicidal ideas. The patient is nervous/anxious. The patient is not hyperactive.     Per HPI unless specifically indicated above     Objective:    BP (!) 157/83 (BP Location: Left Arm, Patient Position: Sitting, Cuff Size: Normal)   Pulse 80   SpO2 98%   Wt Readings from Last 3 Encounters:  12/03/17 221 lb (100.2 kg)  05/26/17 223 lb (101.2 kg)  02/14/17 220 lb (99.8 kg)    Physical Exam  Constitutional: She is oriented to person, place, and time. She appears well-developed and well-nourished. No distress.  HENT:  Head: Normocephalic and atraumatic.  Right Ear: Hearing normal.  Left Ear: Hearing normal.  Nose: Nose normal.  Eyes: Conjunctivae and lids are normal. Right eye exhibits no discharge. Left eye exhibits no discharge. No scleral icterus.  Cardiovascular: Normal rate, regular rhythm, normal heart sounds and intact distal pulses. Exam reveals no gallop and no  friction rub.  No murmur heard. Pulmonary/Chest: Effort normal and breath sounds normal. No stridor. No respiratory distress. She has no wheezes. She has no rales. She exhibits no tenderness.  Musculoskeletal: Normal range of motion.  Neurological: She is alert and oriented to person, place, and time.  Skin: Skin is warm, dry and intact.  Capillary refill takes less than 2 seconds. No rash noted. She is not diaphoretic. No erythema. No pallor.  Psychiatric: Her speech is normal and behavior is normal. Judgment and thought content normal. Her mood appears anxious. Cognition and memory are normal. She exhibits a depressed mood.  Nursing note and vitals reviewed.   Results for orders placed or performed in visit on 12/03/17  Comprehensive metabolic panel  Result Value Ref Range   Glucose 87 65 - 99 mg/dL   BUN 10 6 - 24 mg/dL   Creatinine, Ser 0.84 0.57 - 1.00 mg/dL   GFR calc non Af Amer 78 >59 mL/min/1.73   GFR calc Af Amer 90 >59 mL/min/1.73   BUN/Creatinine Ratio 12 9 - 23   Sodium 142 134 - 144 mmol/L   Potassium 4.6 3.5 - 5.2 mmol/L   Chloride 108 (H) 96 - 106 mmol/L   CO2 21 20 - 29 mmol/L   Calcium 9.2 8.7 - 10.2 mg/dL   Total Protein 6.2 6.0 - 8.5 g/dL   Albumin 4.4 3.5 - 5.5 g/dL   Globulin, Total 1.8 1.5 - 4.5 g/dL   Albumin/Globulin Ratio 2.4 (H) 1.2 - 2.2   Bilirubin Total 0.6 0.0 - 1.2 mg/dL   Alkaline Phosphatase 58 39 - 117 IU/L   AST 14 0 - 40 IU/L   ALT 11 0 - 32 IU/L  Lipid Panel w/o Chol/HDL Ratio  Result Value Ref Range   Cholesterol, Total 174 100 - 199 mg/dL   Triglycerides 92 0 - 149 mg/dL   HDL 53 >39 mg/dL   VLDL Cholesterol Cal 18 5 - 40 mg/dL   LDL Calculated 103 (H) 0 - 99 mg/dL  CBC With Differential/Platelet  Result Value Ref Range   WBC 3.7 3.4 - 10.8 x10E3/uL   RBC 4.27 3.77 - 5.28 x10E6/uL   Hemoglobin 11.1 11.1 - 15.9 g/dL   Hematocrit 34.1 34.0 - 46.6 %   MCV 80 79 - 97 fL   MCH 26.0 (L) 26.6 - 33.0 pg   MCHC 32.6 31.5 - 35.7 g/dL   RDW 17.4 (H) 12.3 - 15.4 %   Platelets 226 150 - 450 x10E3/uL   Neutrophils 48 Not Estab. %   Lymphs 39 Not Estab. %   MID 13 Not Estab. %   Neutrophils Absolute 1.8 1.4 - 7.0 x10E3/uL   Lymphocytes Absolute 1.4 0.7 - 3.1 x10E3/uL   MID (Absolute) 0.5 0.1 - 1.6 X10E3/uL  Alpha Gal IgE  Result Value Ref Range   Alpha Gal IgE* <0.10 <0.10  kU/L      Assessment & Plan:   Problem List Items Addressed This Visit      Digestive   GERD (gastroesophageal reflux disease)    Needs a refill on her protonix. Rx sent to her pharmacy today.      Relevant Medications   pantoprazole (PROTONIX) 40 MG tablet   Gastritis without bleeding    Rechecking CBC today. Await results.       Relevant Orders   CBC with Differential/Platelet     Genitourinary   Benign hypertensive renal disease - Primary    Not under great control. Will  add HCTZ and recheck in 1 month.       Relevant Orders   Basic metabolic panel     Other   Severe episode of recurrent major depressive disorder, without psychotic features ([place])    In acute exacerbation due to the loss of her mother. Will continue prozac and buspar and hydroxyzine. Will add seroquel at night. Will increase klonopin to 1mg  BID PRN for 1 month. Recheck 1 month. Call with any concerns.           Follow up plan: Return in about 1 month (around 01/26/2018).

## 2017-12-29 NOTE — Assessment & Plan Note (Signed)
Rechecking CBC today. Await results.  

## 2017-12-29 NOTE — Assessment & Plan Note (Signed)
Needs a refill on her protonix. Rx sent to her pharmacy today.

## 2017-12-29 NOTE — Assessment & Plan Note (Signed)
Not under great control. Will add HCTZ and recheck in 1 month.

## 2017-12-30 ENCOUNTER — Encounter: Payer: Self-pay | Admitting: Family Medicine

## 2017-12-30 LAB — BASIC METABOLIC PANEL
BUN/Creatinine Ratio: 11 (ref 9–23)
BUN: 9 mg/dL (ref 6–24)
CALCIUM: 9.2 mg/dL (ref 8.7–10.2)
CHLORIDE: 107 mmol/L — AB (ref 96–106)
CO2: 21 mmol/L (ref 20–29)
Creatinine, Ser: 0.83 mg/dL (ref 0.57–1.00)
GFR, EST AFRICAN AMERICAN: 92 mL/min/{1.73_m2} (ref >59)
GFR, EST NON AFRICAN AMERICAN: 80 mL/min/{1.73_m2} (ref >59)
Glucose: 92 mg/dL (ref 65–99)
Potassium: 4.3 mmol/L (ref 3.5–5.2)
Sodium: 143 mmol/L (ref 134–144)

## 2017-12-30 LAB — CBC WITH DIFFERENTIAL/PLATELET
BASOS: 0 %
Basophils Absolute: 0 10*3/uL (ref 0.0–0.2)
EOS (ABSOLUTE): 0.2 10*3/uL (ref 0.0–0.4)
EOS: 3 %
HEMATOCRIT: 35.8 % (ref 34.0–46.6)
HEMOGLOBIN: 11.1 g/dL (ref 11.1–15.9)
IMMATURE GRANS (ABS): 0 10*3/uL (ref 0.0–0.1)
IMMATURE GRANULOCYTES: 0 %
LYMPHS: 39 %
Lymphocytes Absolute: 1.9 10*3/uL (ref 0.7–3.1)
MCH: 24.4 pg — AB (ref 26.6–33.0)
MCHC: 31 g/dL — ABNORMAL LOW (ref 31.5–35.7)
MCV: 79 fL (ref 79–97)
Monocytes Absolute: 0.3 10*3/uL (ref 0.1–0.9)
Monocytes: 7 %
NEUTROS ABS: 2.5 10*3/uL (ref 1.4–7.0)
NEUTROS PCT: 51 %
PLATELETS: 288 10*3/uL (ref 150–450)
RBC: 4.54 x10E6/uL (ref 3.77–5.28)
RDW: 17.3 % — ABNORMAL HIGH (ref 12.3–15.4)
WBC: 4.8 10*3/uL (ref 3.4–10.8)

## 2018-02-02 ENCOUNTER — Ambulatory Visit: Payer: BLUE CROSS/BLUE SHIELD | Admitting: Family Medicine

## 2018-03-18 ENCOUNTER — Ambulatory Visit (INDEPENDENT_AMBULATORY_CARE_PROVIDER_SITE_OTHER): Payer: Self-pay | Admitting: Family Medicine

## 2018-03-18 ENCOUNTER — Encounter: Payer: Self-pay | Admitting: Family Medicine

## 2018-03-18 VITALS — BP 135/83 | HR 79 | Temp 97.9°F | Wt 218.5 lb

## 2018-03-18 DIAGNOSIS — Z23 Encounter for immunization: Secondary | ICD-10-CM

## 2018-03-18 DIAGNOSIS — M791 Myalgia, unspecified site: Secondary | ICD-10-CM

## 2018-03-18 DIAGNOSIS — R5383 Other fatigue: Secondary | ICD-10-CM

## 2018-03-18 MED ORDER — METHOCARBAMOL 500 MG PO TABS: 500.0000 mg | ORAL_TABLET | Freq: Four times a day (QID) | ORAL | 0 refills | Status: DC

## 2018-03-18 NOTE — Progress Notes (Signed)
BP 135/83   Pulse 79   Temp 97.9 F (36.6 C) (Oral)   Wt 218 lb 8 oz (99.1 kg)   SpO2 98%   BMI 36.36 kg/m    Subjective:    Patient ID: Hannah Kaiser, female    DOB: [birth year], 57 y.o.   MRN: [id number]  HPI: Hannah Kaiser is a 57 y.o. female  Chief Complaint  Patient presents with  . muscle cramps    pt states she has been having all over muscle cramps for the past month    Generalized myalgias and fatigue the past month. Chewing ice constantly, drinking tons of water. No new activities, medications, recent illnesses, known tick or other insect bites. Denies rashes, fevers, N/V/D, travel outside the country. Taking OTC pain relievers with minimal relief. Daughter has an autoimmune illness, concerned about these.   Relevant past medical, surgical, family and social history reviewed and updated as indicated. Interim medical history since our last visit reviewed. Allergies and medications reviewed and updated.  Review of Systems  Per HPI unless specifically indicated above     Objective:    BP 135/83   Pulse 79   Temp 97.9 F (36.6 C) (Oral)   Wt 218 lb 8 oz (99.1 kg)   SpO2 98%   BMI 36.36 kg/m   Wt Readings from Last 3 Encounters:  03/18/18 218 lb 8 oz (99.1 kg)  12/03/17 221 lb (100.2 kg)  05/26/17 223 lb (101.2 kg)    Physical Exam  Constitutional: She is oriented to person, place, and time. She appears well-developed and well-nourished.  HENT:  Head: Atraumatic.  Eyes: Pupils are equal, round, and reactive to light. Conjunctivae and EOM are normal.  Neck: Normal range of motion. Neck supple.  Cardiovascular: Normal rate and regular rhythm.  Pulmonary/Chest: Effort normal and breath sounds normal.  Musculoskeletal: Normal range of motion.  Neurological: She is alert and oriented to person, place, and time.  Skin: Skin is warm and dry.  Psychiatric: She has a normal mood and affect. Her behavior is normal.  Nursing note and vitals reviewed.   Results for  orders placed or performed in visit on 03/18/18  CBC with Differential/Platelet  Result Value Ref Range   WBC 5.3 3.4 - 10.8 x10E3/uL   RBC 4.31 3.77 - 5.28 x10E6/uL   Hemoglobin 11.1 11.1 - 15.9 g/dL   Hematocrit 34.5 34.0 - 46.6 %   MCV 80 79 - 97 fL   MCH 25.8 (L) 26.6 - 33.0 pg   MCHC 32.2 31.5 - 35.7 g/dL   RDW 15.3 12.3 - 15.4 %   Platelets 273 150 - 450 x10E3/uL   Neutrophils 52 Not Estab. %   Lymphs 36 Not Estab. %   Monocytes 8 Not Estab. %   Eos 3 Not Estab. %   Basos 1 Not Estab. %   Neutrophils Absolute 2.7 1.4 - 7.0 x10E3/uL   Lymphocytes Absolute 1.9 0.7 - 3.1 x10E3/uL   Monocytes Absolute 0.4 0.1 - 0.9 x10E3/uL   EOS (ABSOLUTE) 0.2 0.0 - 0.4 x10E3/uL   Basophils Absolute 0.0 0.0 - 0.2 x10E3/uL   Immature Granulocytes 0 Not Estab. %   Immature Grans (Abs) 0.0 0.0 - 0.1 x10E3/uL  Comprehensive metabolic panel  Result Value Ref Range   Glucose 74 65 - 99 mg/dL   BUN 19 6 - 24 mg/dL   Creatinine, Ser 1.17 (H) 0.57 - 1.00 mg/dL   GFR calc non Af Amer 53 (L) >59  mL/min/1.73   GFR calc Af Amer 61 >59 mL/min/1.73   BUN/Creatinine Ratio 16 9 - 23   Sodium 144 134 - 144 mmol/L   Potassium 4.4 3.5 - 5.2 mmol/L   Chloride 105 96 - 106 mmol/L   CO2 22 20 - 29 mmol/L   Calcium 9.6 8.7 - 10.2 mg/dL   Total Protein 6.5 6.0 - 8.5 g/dL   Albumin 4.6 3.5 - 5.5 g/dL   Globulin, Total 1.9 1.5 - 4.5 g/dL   Albumin/Globulin Ratio 2.4 (H) 1.2 - 2.2   Bilirubin Total 0.7 0.0 - 1.2 mg/dL   Alkaline Phosphatase 60 39 - 117 IU/L   AST 17 0 - 40 IU/L   ALT 9 0 - 32 IU/L  Sed Rate (ESR)  Result Value Ref Range   Sed Rate 2 0 - 40 mm/hr  ANA w/Reflex  Result Value Ref Range   Anti Nuclear Antibody(ANA) Negative Negative  Rheumatoid Factor  Result Value Ref Range   Rhuematoid fact SerPl-aCnc <10.0 0.0 - 13.9 IU/mL      Assessment & Plan:   Problem List Items Addressed This Visit    None    Visit Diagnoses    Myalgia    -  Primary   Relevant Orders   CBC with  Differential/Platelet (Completed)   Comprehensive metabolic panel (Completed)   Sed Rate (ESR) (Completed)   ANA w/Reflex (Completed)   Rheumatoid Factor (Completed)   Need for influenza vaccination       Relevant Orders   Flu Vaccine QUAD 36+ mos IM (Completed)   Fatigue, unspecified type         Will check basic labs to r/o organic causes such as electrolyte abnormalities, anemia, autoimmune/inflammatory disease. Take multivitamin, continue drinking plenty of water. Robaxin sent for bedtime use. Heating pads, stretching prn. Return precautions reviewed  Follow up plan: Return for as scheduled.

## 2018-03-19 LAB — CBC WITH DIFFERENTIAL/PLATELET
Basophils Absolute: 0 10*3/uL (ref 0.0–0.2)
Basos: 1 %
EOS (ABSOLUTE): 0.2 10*3/uL (ref 0.0–0.4)
EOS: 3 %
HEMATOCRIT: 34.5 % (ref 34.0–46.6)
HEMOGLOBIN: 11.1 g/dL (ref 11.1–15.9)
Immature Grans (Abs): 0 10*3/uL (ref 0.0–0.1)
Immature Granulocytes: 0 %
LYMPHS ABS: 1.9 10*3/uL (ref 0.7–3.1)
Lymphs: 36 %
MCH: 25.8 pg — AB (ref 26.6–33.0)
MCHC: 32.2 g/dL (ref 31.5–35.7)
MCV: 80 fL (ref 79–97)
MONOCYTES: 8 %
Monocytes Absolute: 0.4 10*3/uL (ref 0.1–0.9)
NEUTROS PCT: 52 %
Neutrophils Absolute: 2.7 10*3/uL (ref 1.4–7.0)
Platelets: 273 10*3/uL (ref 150–450)
RBC: 4.31 x10E6/uL (ref 3.77–5.28)
RDW: 15.3 % (ref 12.3–15.4)
WBC: 5.3 10*3/uL (ref 3.4–10.8)

## 2018-03-19 LAB — SEDIMENTATION RATE: SED RATE: 2 mm/h (ref 0–40)

## 2018-03-19 LAB — COMPREHENSIVE METABOLIC PANEL
A/G RATIO: 2.4 — AB (ref 1.2–2.2)
ALBUMIN: 4.6 g/dL (ref 3.5–5.5)
ALK PHOS: 60 IU/L (ref 39–117)
ALT: 9 IU/L (ref 0–32)
AST: 17 IU/L (ref 0–40)
BILIRUBIN TOTAL: 0.7 mg/dL (ref 0.0–1.2)
BUN/Creatinine Ratio: 16 (ref 9–23)
BUN: 19 mg/dL (ref 6–24)
CHLORIDE: 105 mmol/L (ref 96–106)
CO2: 22 mmol/L (ref 20–29)
CREATININE: 1.17 mg/dL — AB (ref 0.57–1.00)
Calcium: 9.6 mg/dL (ref 8.7–10.2)
GFR calc Af Amer: 61 mL/min/{1.73_m2} (ref >59)
GFR calc non Af Amer: 53 mL/min/{1.73_m2} — ABNORMAL LOW (ref >59)
Globulin, Total: 1.9 g/dL (ref 1.5–4.5)
Glucose: 74 mg/dL (ref 65–99)
Potassium: 4.4 mmol/L (ref 3.5–5.2)
SODIUM: 144 mmol/L (ref 134–144)
Total Protein: 6.5 g/dL (ref 6.0–8.5)

## 2018-03-19 LAB — RHEUMATOID FACTOR: Rhuematoid fact SerPl-aCnc: <10 IU/mL (ref 0.0–13.9)

## 2018-03-19 LAB — ANA W/REFLEX: Anti Nuclear Antibody(ANA): NEGATIVE

## 2018-03-22 NOTE — Patient Instructions (Signed)
Follow up as scheduled.  

## 2018-03-24 ENCOUNTER — Telehealth: Payer: Self-pay | Admitting: Family Medicine

## 2018-03-24 MED ORDER — DOXYCYCLINE HYCLATE 100 MG PO TABS: 100.0000 mg | ORAL_TABLET | Freq: Two times a day (BID) | ORAL | 0 refills | Status: DC

## 2018-03-24 NOTE — Telephone Encounter (Signed)
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 Continue muscle relaxers, OTC pain relievers

## 2018-03-25 LAB — ROCKY MTN SPOTTED FVR ABS PNL(IGG+IGM)
RMSF IGG: POSITIVE — AB
RMSF IGM: 1.23 {index} — AB (ref 0.00–0.89)

## 2018-03-25 LAB — RMSF, IGG, IFA: RMSF, IGG, IFA: <1:64 {titer}

## 2018-03-25 LAB — SPECIMEN STATUS REPORT

## 2018-03-25 LAB — B. BURGDORFI ANTIBODIES: Lyme IgG/IgM Ab: <0.91 {ISR} (ref 0.00–0.90)

## 2018-05-11 IMAGING — XA DG MYELOGRAPHY LUMBAR INJ LUMBOSACRAL
13 of 16 series · 13 of 16 positions shown · non-contrast
Comparison: MRI of the lumbar spine 09/06/2015

CLINICAL DATA: Low back pain extending into the posterior hips and
buttocks bilaterally, right greater than left. S1 radiculitis.
TECHNIQUE: Contiguous axial images were obtained through the Lumbar spine after
the intrathecal infusion of infusion. Coronal and sagittal
reconstructions were obtained of the axial image sets.

[Series 1: w lumbar spine lat · 0.15mm/px · 1 of 1 slices shown]
[im 1/1]
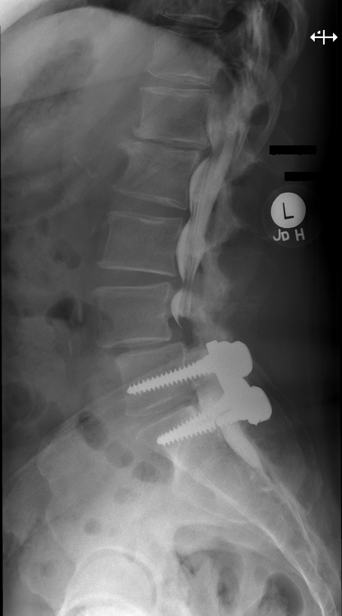

[Series 1: vasc adipose · 1 of 1 slices shown (1 of 11)]
[im 1/1]
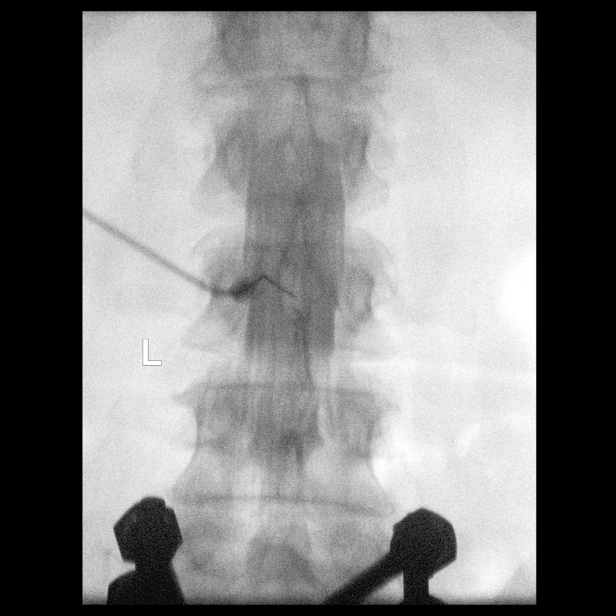

[Series 2: vasc adipose · 1 of 1 slices shown (2 of 11)]
[im 1/1]
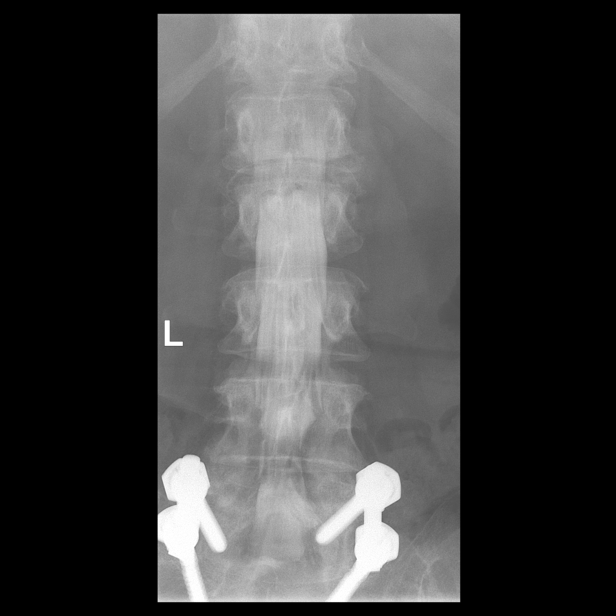

[Series 3: w lumbar spine extension · 0.15mm/px · 1 of 1 slices shown]
[im 1/1]
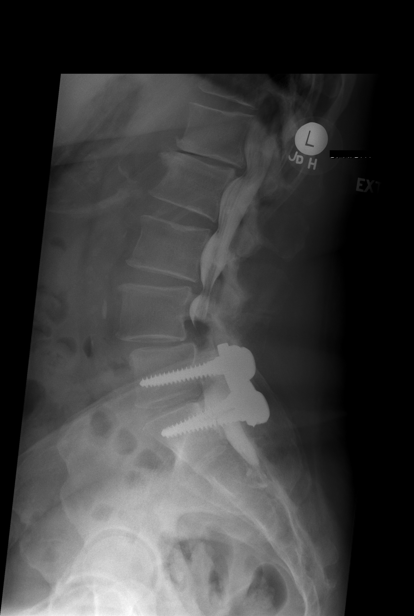

[Series 3: vasc adipose · 1 of 1 slices shown (3 of 11)]
[im 1/1]
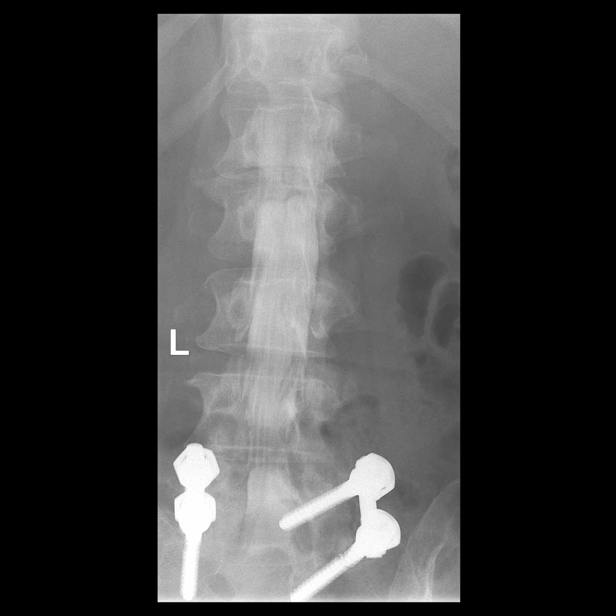

[Series 4: vasc adipose · 1 of 1 slices shown (4 of 11)]
[im 1/1]
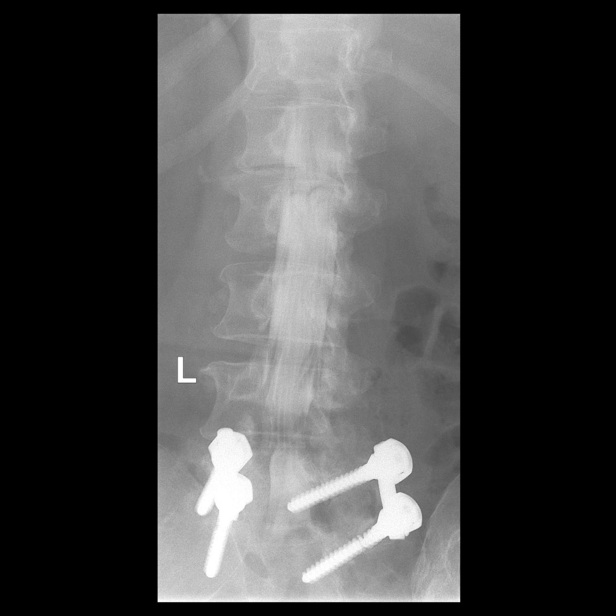

[Series 6: vasc adipose · 1 of 1 slices shown (5 of 11)]
[im 1/1]
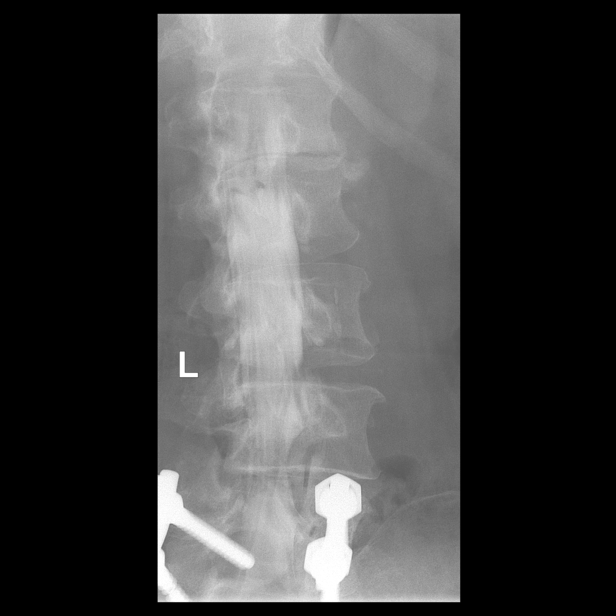

[Series 7: vasc adipose · 1 of 1 slices shown (6 of 11)]
[im 1/1]
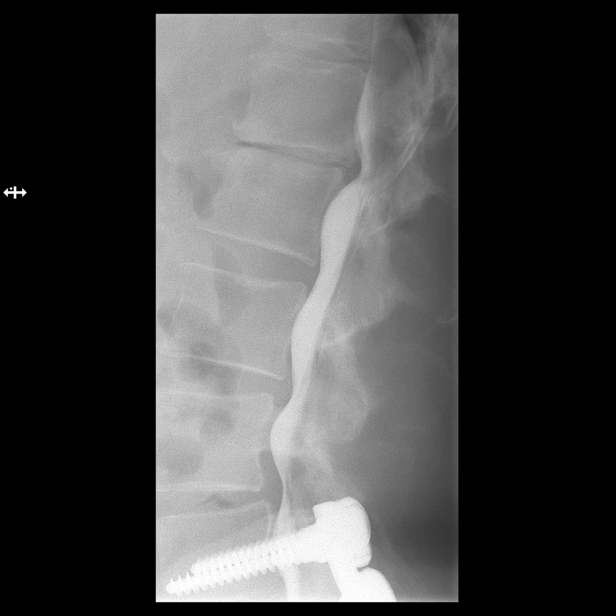

[Series 8: vasc adipose · 1 of 1 slices shown (7 of 11)]
[im 1/1]
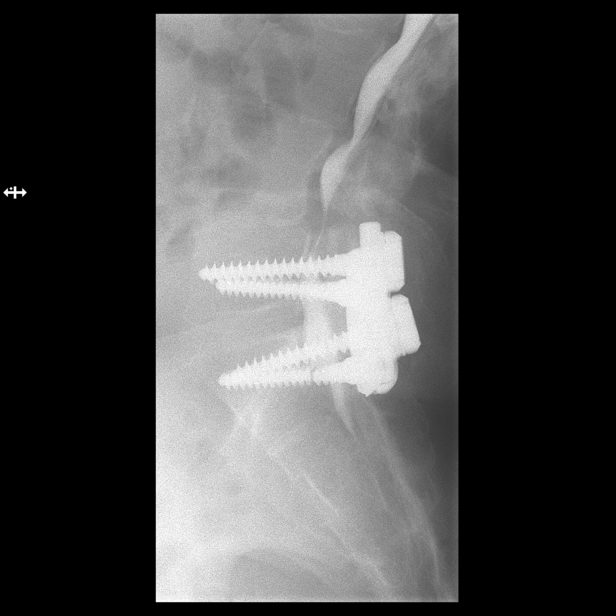

[Series 9: vasc adipose · 1 of 1 slices shown (8 of 11)]
[im 1/1]
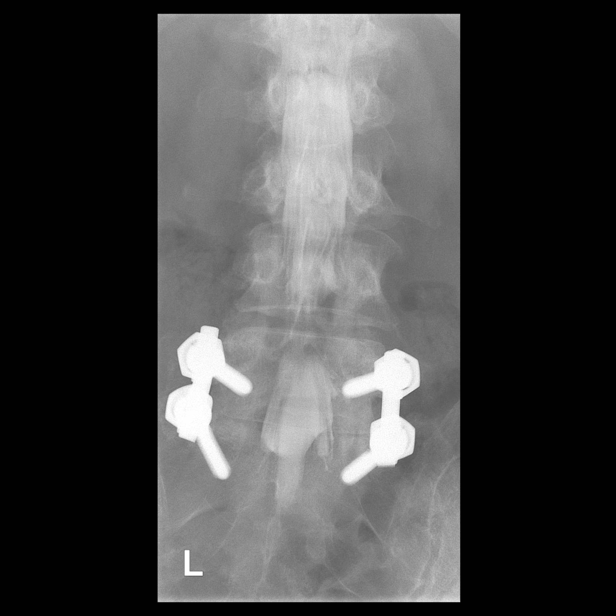

[Series 10: vasc adipose · 1 of 1 slices shown (9 of 11)]
[im 1/1]
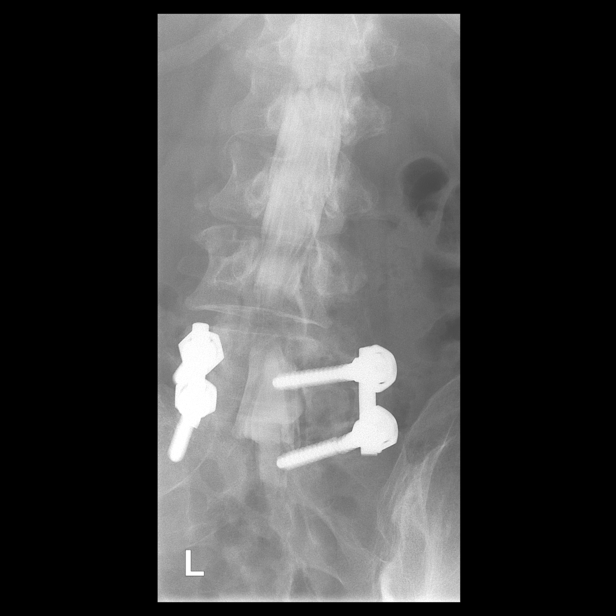

[Series 12: vasc adipose · 1 of 1 slices shown (10 of 11)]
[im 1/1]
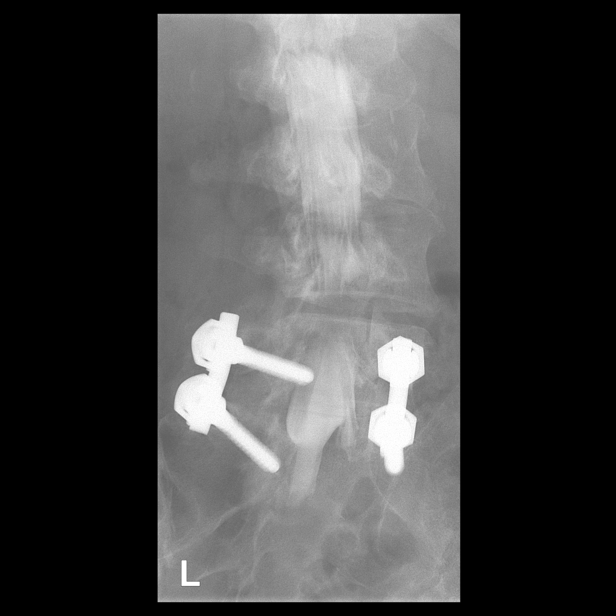

[Series 13: vasc adipose · 1 of 1 slices shown (11 of 11)]
[im 1/1]
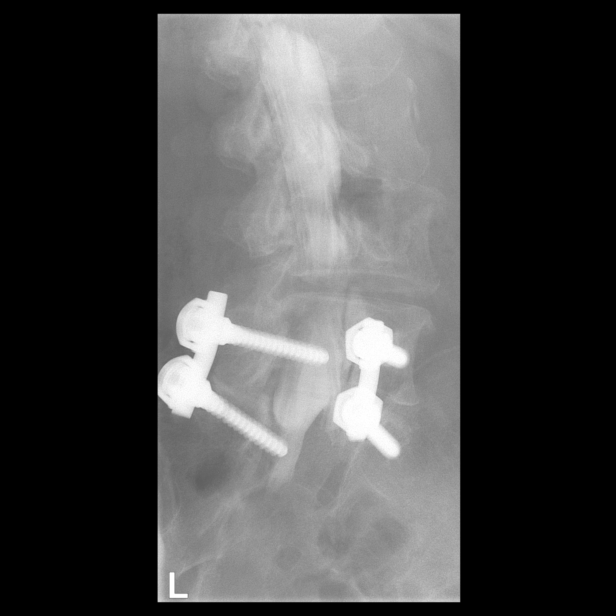

[13 of 16 positions shown; findings below may reference images not displayed]

EXAM:
LUMBAR MYELOGRAM

FLUOROSCOPY TIME:  Radiation Exposure Index (as provided by the
fluoroscopic device): 291.09 uGy*m2

Fluoroscopy Time:  43 seconds

Number of Acquired Images:  15

PROCEDURE:
After thorough discussion of risks and benefits of the procedure
including bleeding, infection, injury to nerves, blood vessels,
adjacent structures as well as headache and CSF leak, written and
oral informed consent was obtained. Consent was obtained by Dr.
Junihato Grandson. Time out form was completed.

Patient was positioned prone on the fluoroscopy table. Local
anesthesia was provided with 1% lidocaine without epinephrine after
prepped and draped in the usual sterile fashion. Puncture was
performed at L2-3 using a 3 1/2 inch 22-gauge spinal needle via left
paramedian approach. Using a single pass through the dura, the
needle was placed within the thecal sac, with return of clear CSF.
15 mL of Isovue-M 200 was injected into the thecal sac, with normal
opacification of the nerve roots and cauda equina consistent with
free flow within the subarachnoid space.

I personally performed the lumbar puncture and administered the
intrathecal contrast. I also personally supervised acquisition of
the myelogram images.
FINDINGS: LUMBAR MYELOGRAM FINDINGS:

PLIF is noted at L5-S1. The left S1 pedicle screw is fractured
without significant displacement.

Moderate central canal stenosis is present at the L4-5 level with
right greater than left subarticular stenosis. There is mild
narrowing at L3-4 as well with a broad-based disc protrusion.

Chronic endplate change and slight retrolisthesis is present at L1-2
with mild subarticular narrowing bilaterally.

The upright images demonstrate exaggeration of the grade 1
anterolisthesis at L4-5. This is further exaggerated with flexion
and slightly reduced in extension. This results an exacerbation of
the central canal stenosis.

The disc protrusions at L3-4 and L1-2 are also worse with standing.
There is no significant other change an alignment with flexion or
extension.

CT LUMBAR MYELOGRAM FINDINGS:

The lumbar spine is imaged from the midbody of T11 of through S3.
Conus medullaris terminates at L1-2.

New 3.6 cm cyst is again noted within the caudate lobe of the liver.
Limited images the abdomen demonstrates atherosclerotic
calcifications without aneurysm. There is no significant adenopathy.

L1-2: A broad-based disc protrusion is present. Moderate facet
hypertrophy is noted. This results in mild subarticular narrowing
bilaterally. Mild foraminal narrowing is worse on the right.

L2-3: Mild disc bulging and moderate facet hypertrophy is present
bilaterally. There is some distortion of the central canal without
significant stenosis. The foramina are patent.

L3-4: A broad-based disc protrusion is present. Moderate facet
hypertrophy and ligamentum flavum thickening results in mild
subarticular and foraminal narrowing bilaterally, right greater than
left.

L4-5: Grade 1 anterolisthesis is present. There is uncovering of a
broad-based disc protrusion. Advanced facet hypertrophy and
ligamentum flavum thickening results an moderate central canal
stenosis. There is thickening of the nerve roots bilaterally.
Moderate foraminal stenosis is worse on the left.

L5-S1: PLIF is present. There is some thickening of the nerve roots.
No residual central canal stenosis is present. The foramina are
patent bilaterally.
IMPRESSION: 1. PLIF at L5-S1 without evidence for residual or recurrent stenosis
at this level.
2. The right pedicle screw at S1 is fractured without displacement.
3. Adjacent level disease with moderate central and left greater
than right foraminal stenosis at L4-5.
4. Dynamic anterolisthesis at L4-5 with upright imaging, flexion,
and extension. This exaggerates the central canal stenosis unlikely
but foraminal stenosis is well.
5. Mild subarticular and foraminal narrowing bilaterally at L3-4,
right greater than left.
6. Chronic endplate change and loss of height anteriorly at L1-2
with mild subarticular and foraminal stenosis bilaterally, right
greater than left.

## 2018-08-24 ENCOUNTER — Other Ambulatory Visit: Payer: Self-pay | Admitting: Family Medicine

## 2018-08-24 MED ORDER — QUETIAPINE FUMARATE 25 MG PO TABS: 25.0000 mg | ORAL_TABLET | Freq: Every day | ORAL | 3 refills | Status: DC

## 2018-08-24 MED ORDER — PANTOPRAZOLE SODIUM 40 MG PO TBEC: 40.0000 mg | DELAYED_RELEASE_TABLET | Freq: Every day | ORAL | 1 refills | Status: DC

## 2018-08-24 MED ORDER — BUSPIRONE HCL 5 MG PO TABS: 5.0000 mg | ORAL_TABLET | Freq: Three times a day (TID) | ORAL | 1 refills | Status: DC

## 2018-08-24 MED ORDER — EZETIMIBE 10 MG PO TABS: 10.0000 mg | ORAL_TABLET | Freq: Every day | ORAL | 1 refills | Status: DC

## 2018-08-24 MED ORDER — ATENOLOL 25 MG PO TABS: 25.0000 mg | ORAL_TABLET | Freq: Every day | ORAL | 1 refills | Status: DC

## 2018-08-24 MED ORDER — AMLODIPINE BESYLATE 5 MG PO TABS: 5.0000 mg | ORAL_TABLET | Freq: Every day | ORAL | 1 refills | Status: DC

## 2018-08-29 ENCOUNTER — Telehealth: Payer: Self-pay

## 2018-08-29 NOTE — Telephone Encounter (Signed)
Ibuprofen 600 mg in chart, patient would like to be increased to 800 mg. Pharmacy is [facility].

## 2018-08-30 MED ORDER — IBUPROFEN 800 MG PO TABS: 800.0000 mg | ORAL_TABLET | Freq: Three times a day (TID) | ORAL | 1 refills | Status: DC | PRN

## 2018-10-13 ENCOUNTER — Encounter: Payer: Self-pay | Admitting: Family Medicine

## 2018-10-13 ENCOUNTER — Ambulatory Visit
Admission: RE | Admit: 2018-10-13 | Discharge: 2018-10-13 | Disposition: A | Discharge status: Home / Self Care | Payer: Self-pay | Source: Ambulatory Visit | Attending: Family Medicine | Admitting: Family Medicine

## 2018-10-13 ENCOUNTER — Other Ambulatory Visit: Payer: Self-pay

## 2018-10-13 ENCOUNTER — Ambulatory Visit
Admission: RE | Admit: 2018-10-13 | Discharge: 2018-10-13 | Disposition: A | Discharge status: Home / Self Care | Payer: Self-pay | Attending: Family Medicine | Admitting: Family Medicine

## 2018-10-13 ENCOUNTER — Ambulatory Visit (INDEPENDENT_AMBULATORY_CARE_PROVIDER_SITE_OTHER): Payer: Self-pay | Admitting: Family Medicine

## 2018-10-13 DIAGNOSIS — M25562 Pain in left knee: Secondary | ICD-10-CM

## 2018-10-13 DIAGNOSIS — M79605 Pain in left leg: Secondary | ICD-10-CM | POA: Insufficient documentation

## 2018-10-13 DIAGNOSIS — M25552 Pain in left hip: Secondary | ICD-10-CM

## 2018-10-13 MED ORDER — PREDNISONE 50 MG PO TABS: 50.0000 mg | ORAL_TABLET | Freq: Every day | ORAL | 0 refills | Status: DC

## 2018-10-13 MED ORDER — HYDROCODONE-ACETAMINOPHEN 10-325 MG PO TABS: 1.0000 | ORAL_TABLET | Freq: Three times a day (TID) | ORAL | 0 refills | Status: AC | PRN

## 2018-10-13 NOTE — Progress Notes (Signed)
There were no vitals taken for this visit.   Subjective:    Patient ID: Hannah Kaiser, female    DOB: [birth year], 58 y.o.   MRN: [id number]  HPI: Hannah Kaiser is a 58 y.o. female  Chief Complaint  Patient presents with  . Leg Pain   Hannah Kaiser notes that about 15 minutes ago, she was walking inside the house when she tripped over the dog and fell on her L leg. She notes that she fell over the dog and fell on the L side at an angle, her foot went one way and her knee went the other. She heard a pop and immediately began with intense 7-8/10 pain in her knee cap, down the lateral side of her L leg and to the top of her toes. She notes that nothing is popping skin. It is intensely swollen and tender. Bruising came on immediately. She can walk on it, but hurts a lot. She notes that she is having some tingling on the top of her toes. She has not tried anything for it just yet. She is afraid that she broke it. She is otherwise feeling well with no other concerns or complaints at this time.   Relevant past medical, surgical, family and social history reviewed and updated as indicated. Interim medical history since our last visit reviewed. Allergies and medications reviewed and updated.  Review of Systems  Constitutional: Negative.   Respiratory: Negative.   Cardiovascular: Negative.   Musculoskeletal: Positive for arthralgias, gait problem and joint swelling. Negative for back pain, myalgias, neck pain and neck stiffness.  Skin: Positive for color change. Negative for pallor, rash and wound.  Psychiatric/Behavioral: Negative.     Per HPI unless specifically indicated above     Objective:    There were no vitals taken for this visit.  Wt Readings from Last 3 Encounters:  03/18/18 218 lb 8 oz (99.1 kg)  12/03/17 221 lb (100.2 kg)  05/26/17 223 lb (101.2 kg)    Physical Exam Vitals signs and nursing note reviewed.  Constitutional:      General: She is not in acute distress.    Appearance:  Normal appearance. She is not ill-appearing, toxic-appearing or diaphoretic.  HENT:     Head: Normocephalic and atraumatic.     Right Ear: External ear normal.     Left Ear: External ear normal.     Nose: Nose normal.     Mouth/Throat:     Mouth: Mucous membranes are moist.     Pharynx: Oropharynx is clear.  Eyes:     General: No scleral icterus.       Right eye: No discharge.        Left eye: No discharge.     Conjunctiva/sclera: Conjunctivae normal.     Pupils: Pupils are equal, round, and reactive to light.  Neck:     Musculoskeletal: Normal range of motion.  Pulmonary:     Effort: Pulmonary effort is normal. No respiratory distress.     Comments: Speaking in full sentences Musculoskeletal: Normal range of motion.        General: Swelling, tenderness and signs of injury present. No deformity.     Right lower leg: No edema.     Left lower leg: No edema.     Comments: Significant swelling to L knee with loss of landmarks. + ecchymosis. Antalgic gait, tenderness along joint line on palpation by her daughter, visually confirmed on virtual visit  Skin:    Coloration:  Skin is not jaundiced or pale.     Findings: Bruising present. No erythema, lesion or rash.  Neurological:     Mental Status: She is alert and oriented to person, place, and time. Mental status is at baseline.  Psychiatric:        Mood and Affect: Mood normal.        Behavior: Behavior normal.        Thought Content: Thought content normal.        Judgment: Judgment normal.     Results for orders placed or performed in visit on 03/18/18  CBC with Differential/Platelet  Result Value Ref Range   WBC 5.3 3.4 - 10.8 x10E3/uL   RBC 4.31 3.77 - 5.28 x10E6/uL   Hemoglobin 11.1 11.1 - 15.9 g/dL   Hematocrit 34.5 34.0 - 46.6 %   MCV 80 79 - 97 fL   MCH 25.8 (L) 26.6 - 33.0 pg   MCHC 32.2 31.5 - 35.7 g/dL   RDW 15.3 12.3 - 15.4 %   Platelets 273 150 - 450 x10E3/uL   Neutrophils 52 Not Estab. %   Lymphs 36 Not  Estab. %   Monocytes 8 Not Estab. %   Eos 3 Not Estab. %   Basos 1 Not Estab. %   Neutrophils Absolute 2.7 1.4 - 7.0 x10E3/uL   Lymphocytes Absolute 1.9 0.7 - 3.1 x10E3/uL   Monocytes Absolute 0.4 0.1 - 0.9 x10E3/uL   EOS (ABSOLUTE) 0.2 0.0 - 0.4 x10E3/uL   Basophils Absolute 0.0 0.0 - 0.2 x10E3/uL   Immature Granulocytes 0 Not Estab. %   Immature Grans (Abs) 0.0 0.0 - 0.1 x10E3/uL  Comprehensive metabolic panel  Result Value Ref Range   Glucose 74 65 - 99 mg/dL   BUN 19 6 - 24 mg/dL   Creatinine, Ser 1.17 (H) 0.57 - 1.00 mg/dL   GFR calc non Af Amer 53 (L) >59 mL/min/1.73   GFR calc Af Amer 61 >59 mL/min/1.73   BUN/Creatinine Ratio 16 9 - 23   Sodium 144 134 - 144 mmol/L   Potassium 4.4 3.5 - 5.2 mmol/L   Chloride 105 96 - 106 mmol/L   CO2 22 20 - 29 mmol/L   Calcium 9.6 8.7 - 10.2 mg/dL   Total Protein 6.5 6.0 - 8.5 g/dL   Albumin 4.6 3.5 - 5.5 g/dL   Globulin, Total 1.9 1.5 - 4.5 g/dL   Albumin/Globulin Ratio 2.4 (H) 1.2 - 2.2   Bilirubin Total 0.7 0.0 - 1.2 mg/dL   Alkaline Phosphatase 60 39 - 117 IU/L   AST 17 0 - 40 IU/L   ALT 9 0 - 32 IU/L  Sed Rate (ESR)  Result Value Ref Range   Sed Rate 2 0 - 40 mm/hr  ANA w/Reflex  Result Value Ref Range   Anti Nuclear Antibody(ANA) Negative Negative  Rheumatoid Factor  Result Value Ref Range   Rhuematoid fact SerPl-aCnc <10.0 0.0 - 13.9 IU/mL  Rocky mtn spotted fvr abs pnl(IgG+IgM)  Result Value Ref Range   RMSF IgG Positive (A) Negative   RMSF IgM 1.23 (H) 0.00 - 0.89 index  B. burgdorfi antibodies  Result Value Ref Range   Lyme IgG/IgM Ab <0.91 0.00 - 0.90 ISR  Specimen status report  Result Value Ref Range   specimen status report Comment   RMSF, IgG, IFA  Result Value Ref Range   RMSF, IGG, IFA <1:64 Neg <1:64      Assessment & Plan:   Problem List Items Addressed  This Visit    None    Visit Diagnoses    Acute pain of left knee    -  Primary   Significant concern for acute tear in her knee. Will check  x-ray, treat with prednisone and pain medicine. Immobilize and crutches.    Relevant Orders   DG Knee Complete 4 Views Left   DG Tibia/Fibula Left   Acute leg pain, left       See discussion under Knee pain   Relevant Orders   DG Knee Complete 4 Views Left   DG Tibia/Fibula Left       Follow up plan: Return 1-2 weeks in person, for recheck knee.    . This visit was completed via FaceTime due to the restrictions of the COVID-19 pandemic. All issues as above were discussed and addressed. Physical exam was done as above through visual confirmation on FaceTime. If it was felt that the patient should be evaluated in the office, they were directed there. The patient verbally consented to this visit. . Location of the patient: home . Location of the provider: home . Those involved with this call:  . Provider: Park Liter, DO . CMA: Yvonna Alanis, [place] . Front Desk/Registration: Don Perking  . Time spent on call: 15 minutes with patient face to face via video conference. More than 50% of this time was spent in counseling and coordination of care. 23 minutes total spent in review of patient's record and preparation of their chart.

## 2018-10-13 NOTE — Addendum Note (Signed)
Addended by: Valerie Roys on: 10/13/2018 12:46 PM   Modules accepted: Orders

## 2018-10-24 ENCOUNTER — Ambulatory Visit (INDEPENDENT_AMBULATORY_CARE_PROVIDER_SITE_OTHER): Payer: Self-pay | Admitting: Family Medicine

## 2018-10-24 ENCOUNTER — Other Ambulatory Visit: Payer: Self-pay

## 2018-10-24 ENCOUNTER — Encounter: Payer: Self-pay | Admitting: Family Medicine

## 2018-10-24 VITALS — BP 168/111 | HR 59 | Temp 98.5°F | Ht 64.0 in | Wt 230.0 lb

## 2018-10-24 DIAGNOSIS — I129 Hypertensive chronic kidney disease with stage 1 through stage 4 chronic kidney disease, or unspecified chronic kidney disease: Secondary | ICD-10-CM

## 2018-10-24 DIAGNOSIS — M25562 Pain in left knee: Secondary | ICD-10-CM

## 2018-10-24 MED ORDER — HYDROXYZINE HCL 25 MG PO TABS: 25.0000 mg | ORAL_TABLET | Freq: Three times a day (TID) | ORAL | 6 refills | Status: DC | PRN

## 2018-10-24 MED ORDER — AMLODIPINE BESYLATE 10 MG PO TABS: 10.0000 mg | ORAL_TABLET | Freq: Every day | ORAL | 1 refills | Status: DC

## 2018-10-24 MED ORDER — VALACYCLOVIR HCL 1 G PO TABS: 1000.0000 mg | ORAL_TABLET | Freq: Every day | ORAL | 1 refills | Status: DC

## 2018-10-24 NOTE — Assessment & Plan Note (Signed)
Not under good control. Will increase her amlodipine to 10mg  daily and recheck 1 month. Call with any concerns.

## 2018-10-24 NOTE — Progress Notes (Signed)
BP (!) 168/111   Pulse (!) 59   Temp 98.5 F (36.9 C) (Oral)   Ht _0  (1.626 m)   Wt 230 lb (104.3 kg)   SpO2 97%   BMI 39.48 kg/m    Subjective:    Patient ID: Hannah Kaiser, female    DOB: Dec 15, 1960, 58 y.o.   MRN: [id number]  HPI: Hannah Kaiser is a 58 y.o. female  Chief Complaint  Patient presents with  . Knee Pain    f/u  . Medication Refill    valtrex   Has not been doing well with her knee. She notes that she fell 11 days ago when she tripped over a dog and fell on her side and felt a pop. She notes that she is not doing better. She is walking on her knee, but it continues to hurt a lot and feels like it's going to give out on her. It has been swollen and tender. + popping, no locking, no numbness or tingling. No other concerns or complaints at this time  HYPERTENSION Hypertension status: uncontrolled  Satisfied with current treatment? no Duration of hypertension: chronic BP monitoring frequency:  not checking BP range:  BP medication side effects:  no Medication compliance: good compliance Previous BP meds: atenolol, amlodipine, losartan Aspirin: yes Recurrent headaches: no Visual changes: no Palpitations: no Dyspnea: no Chest pain: no Lower extremity edema: no Dizzy/lightheaded: no   Relevant past medical, surgical, family and social history reviewed and updated as indicated. Interim medical history since our last visit reviewed. Allergies and medications reviewed and updated.  Review of Systems  Constitutional: Negative.   Respiratory: Negative.   Cardiovascular: Negative.   Musculoskeletal: Positive for arthralgias, gait problem and joint swelling. Negative for back pain, myalgias, neck pain and neck stiffness.  Skin: Negative.   Neurological: Negative for dizziness, tremors, seizures, syncope, facial asymmetry, speech difficulty, weakness, light-headedness, numbness and headaches.  Hematological: Negative.   Psychiatric/Behavioral: Negative.     Per HPI unless specifically indicated above     Objective:    BP (!) 168/111   Pulse (!) 59   Temp 98.5 F (36.9 C) (Oral)   Ht _1  (1.626 m)   Wt 230 lb (104.3 kg)   SpO2 97%   BMI 39.48 kg/m   Wt Readings from Last 3 Encounters:  10/24/18 230 lb (104.3 kg)  03/18/18 218 lb 8 oz (99.1 kg)  12/03/17 221 lb (100.2 kg)    Physical Exam Vitals signs and nursing note reviewed.  Constitutional:      General: She is not in acute distress.    Appearance: Normal appearance. She is not ill-appearing, toxic-appearing or diaphoretic.  HENT:     Head: Normocephalic and atraumatic.     Right Ear: External ear normal.     Left Ear: External ear normal.     Nose: Nose normal.     Mouth/Throat:     Mouth: Mucous membranes are moist.     Pharynx: Oropharynx is clear.  Eyes:     General: No scleral icterus.       Right eye: No discharge.        Left eye: No discharge.     Extraocular Movements: Extraocular movements intact.     Conjunctiva/sclera: Conjunctivae normal.     Pupils: Pupils are equal, round, and reactive to light.  Neck:     Musculoskeletal: Normal range of motion and neck supple.  Cardiovascular:     Rate and Rhythm:  Normal rate and regular rhythm.     Pulses: Normal pulses.     Heart sounds: Normal heart sounds. No murmur. No friction rub. No gallop.   Pulmonary:     Effort: Pulmonary effort is normal. No respiratory distress.     Breath sounds: Normal breath sounds. No stridor. No wheezing, rhonchi or rales.  Chest:     Chest wall: No tenderness.  Musculoskeletal:        General: Swelling and tenderness present.     Comments: Tenderness along joint line of L knee, +valgus, negative varus, + laxity on Lachman's, negative McMurray's  Skin:    General: Skin is warm and dry.     Capillary Refill: Capillary refill takes less than 2 seconds.     Coloration: Skin is not jaundiced or pale.     Findings: No bruising, erythema, lesion or rash.  Neurological:      General: No focal deficit present.     Mental Status: She is alert and oriented to person, place, and time. Mental status is at baseline.  Psychiatric:        Mood and Affect: Mood normal.        Behavior: Behavior normal.        Thought Content: Thought content normal.        Judgment: Judgment normal.     Results for orders placed or performed in visit on 03/18/18  CBC with Differential/Platelet  Result Value Ref Range   WBC 5.3 3.4 - 10.8 x10E3/uL   RBC 4.31 3.77 - 5.28 x10E6/uL   Hemoglobin 11.1 11.1 - 15.9 g/dL   Hematocrit 34.5 34.0 - 46.6 %   MCV 80 79 - 97 fL   MCH 25.8 (L) 26.6 - 33.0 pg   MCHC 32.2 31.5 - 35.7 g/dL   RDW 15.3 12.3 - 15.4 %   Platelets 273 150 - 450 x10E3/uL   Neutrophils 52 Not Estab. %   Lymphs 36 Not Estab. %   Monocytes 8 Not Estab. %   Eos 3 Not Estab. %   Basos 1 Not Estab. %   Neutrophils Absolute 2.7 1.4 - 7.0 x10E3/uL   Lymphocytes Absolute 1.9 0.7 - 3.1 x10E3/uL   Monocytes Absolute 0.4 0.1 - 0.9 x10E3/uL   EOS (ABSOLUTE) 0.2 0.0 - 0.4 x10E3/uL   Basophils Absolute 0.0 0.0 - 0.2 x10E3/uL   Immature Granulocytes 0 Not Estab. %   Immature Grans (Abs) 0.0 0.0 - 0.1 x10E3/uL  Comprehensive metabolic panel  Result Value Ref Range   Glucose 74 65 - 99 mg/dL   BUN 19 6 - 24 mg/dL   Creatinine, Ser 1.17 (H) 0.57 - 1.00 mg/dL   GFR calc non Af Amer 53 (L) >59 mL/min/1.73   GFR calc Af Amer 61 >59 mL/min/1.73   BUN/Creatinine Ratio 16 9 - 23   Sodium 144 134 - 144 mmol/L   Potassium 4.4 3.5 - 5.2 mmol/L   Chloride 105 96 - 106 mmol/L   CO2 22 20 - 29 mmol/L   Calcium 9.6 8.7 - 10.2 mg/dL   Total Protein 6.5 6.0 - 8.5 g/dL   Albumin 4.6 3.5 - 5.5 g/dL   Globulin, Total 1.9 1.5 - 4.5 g/dL   Albumin/Globulin Ratio 2.4 (H) 1.2 - 2.2   Bilirubin Total 0.7 0.0 - 1.2 mg/dL   Alkaline Phosphatase 60 39 - 117 IU/L   AST 17 0 - 40 IU/L   ALT 9 0 - 32 IU/L  Sed Rate (ESR)  Result Value Ref Range   Sed Rate 2 0 - 40 mm/hr  ANA w/Reflex  Result  Value Ref Range   Anti Nuclear Antibody(ANA) Negative Negative  Rheumatoid Factor  Result Value Ref Range   Rhuematoid fact SerPl-aCnc <10.0 0.0 - 13.9 IU/mL  Rocky mtn spotted fvr abs pnl(IgG+IgM)  Result Value Ref Range   RMSF IgG Positive (A) Negative   RMSF IgM 1.23 (H) 0.00 - 0.89 index  B. burgdorfi antibodies  Result Value Ref Range   Lyme IgG/IgM Ab <0.91 0.00 - 0.90 ISR  Specimen status report  Result Value Ref Range   specimen status report Comment   RMSF, IgG, IFA  Result Value Ref Range   RMSF, IGG, IFA <1:64 Neg <1:64      Assessment & Plan:   Problem List Items Addressed This Visit      Genitourinary   Benign hypertensive renal disease    Not under good control. Will increase her amlodipine to 52m daily and recheck 1 month. Call with any concerns.        Other Visit Diagnoses    Acute pain of left knee    -  Primary   S/p fall- concern for ligamentous tear. Will get her into see ortho. Call with any concerns. Continue to monitor.    Relevant Orders   Ambulatory referral to Orthopedic Surgery       Follow up plan: Return in about 4 weeks (around 11/21/2018).

## 2018-11-22 ENCOUNTER — Ambulatory Visit: Payer: Self-pay | Admitting: Family Medicine

## 2019-01-16 ENCOUNTER — Other Ambulatory Visit: Payer: Self-pay

## 2019-01-16 ENCOUNTER — Encounter: Payer: Self-pay | Admitting: Family Medicine

## 2019-01-16 ENCOUNTER — Ambulatory Visit (INDEPENDENT_AMBULATORY_CARE_PROVIDER_SITE_OTHER): Payer: HRSA Program | Admitting: Family Medicine

## 2019-01-16 DIAGNOSIS — Z20822 Contact with and (suspected) exposure to covid-19: Secondary | ICD-10-CM

## 2019-01-16 DIAGNOSIS — Z20828 Contact with and (suspected) exposure to other viral communicable diseases: Secondary | ICD-10-CM

## 2019-01-16 NOTE — Progress Notes (Signed)
   There were no vitals taken for this visit.   Subjective:    Patient ID: Hannah Kaiser, female    DOB: [birth year], 58 y.o.   MRN: [id number]  HPI: Hannah Kaiser is a 58 y.o. female  Chief Complaint  Patient presents with  . COVID    pt states she was around someone Friday night who tested positive on Saturday for COVID    . This visit was completed via telephone due to the restrictions of the COVID-19 pandemic. All issues as above were discussed and addressed. Physical exam was done as above through visual confirmation on telephone. If it was felt that the patient should be evaluated in the office, they were directed there. The patient verbally consented to this visit. . Location of the patient: home . Location of the provider: work . Those involved with this call:  . Provider: Merrie Roof, PA-C . CMA: Merilyn Baba, [place] . Front Desk/Registration: Jill Side  . Time spent on call: 15 minutes on the phone discussing health concerns. 5 minutes total spent in review of patient's record and preparation of their chart. I verified patient identity using two factors (patient name and date of birth). Patient consents verbally to being seen via telemedicine visit today.   Patient presenting today due to recent COVID 19 exposure. 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 She states she is asymptomatic and in her usual state of health at this time. Denies cough, congestion, CP, SOB, N/V/D, fevers.   Relevant past medical, surgical, family and social history reviewed and updated as indicated. Interim medical history since our last visit reviewed. Allergies and medications reviewed and updated.  Review of Systems  Per HPI unless specifically indicated above     Objective:    There were no vitals taken for this visit.  Wt Readings from Last 3 Encounters:  10/24/18 230 lb (104.3 kg)  03/18/18 218 lb 8 oz (99.1 kg)   12/03/17 221 lb (100.2 kg)    Physical Exam  Unable to perform PE due to patient lack of access to video visit technology  Results for orders placed or performed in visit on 01/16/19  Novel Coronavirus, NAA ([facility])   Specimen: Oropharyngeal(OP) collection in vial transport medium   OROPHARYNGEA  TESTING  Result Value Ref Range   SARS-CoV-2, NAA Not Detected Not Detected      Assessment & Plan:   Problem List Items Addressed This Visit    None    Visit Diagnoses    Exposure to Covid-19 Virus    -  Primary   Refer for testing, quarantine until results return. Strict return precautions given if becoming symptomatic       Follow up plan: Return if symptoms worsen or fail to improve.

## 2019-01-17 LAB — NOVEL CORONAVIRUS, NAA: SARS-CoV-2, NAA: NOT DETECTED

## 2019-01-27 ENCOUNTER — Other Ambulatory Visit: Payer: Self-pay

## 2019-01-27 NOTE — Telephone Encounter (Signed)
Needs appointment

## 2019-01-27 NOTE — Telephone Encounter (Signed)
Called patient. Left detailed VM (DPR reviewed) asked patient to return call to the office to schedule appt

## 2019-01-27 NOTE — Telephone Encounter (Signed)
Patient wants to know if she can increase Buspar.

## 2019-01-30 NOTE — Telephone Encounter (Signed)
Appointment scheduled for Wednesday 02/01/19

## 2019-02-01 ENCOUNTER — Other Ambulatory Visit: Payer: Self-pay

## 2019-02-01 ENCOUNTER — Ambulatory Visit (INDEPENDENT_AMBULATORY_CARE_PROVIDER_SITE_OTHER): Payer: Self-pay | Admitting: Family Medicine

## 2019-02-01 ENCOUNTER — Encounter: Payer: Self-pay | Admitting: Family Medicine

## 2019-02-01 VITALS — BP 167/89 | HR 75 | Wt 235.0 lb

## 2019-02-01 DIAGNOSIS — F332 Major depressive disorder, recurrent severe without psychotic features: Secondary | ICD-10-CM

## 2019-02-01 DIAGNOSIS — I129 Hypertensive chronic kidney disease with stage 1 through stage 4 chronic kidney disease, or unspecified chronic kidney disease: Secondary | ICD-10-CM

## 2019-02-01 DIAGNOSIS — E782 Mixed hyperlipidemia: Secondary | ICD-10-CM

## 2019-02-01 MED ORDER — BUSPIRONE HCL 5 MG PO TABS: 5.0000 mg | ORAL_TABLET | Freq: Three times a day (TID) | ORAL | 1 refills | Status: DC

## 2019-02-01 MED ORDER — CLONAZEPAM 1 MG PO TABS: 1.0000 mg | ORAL_TABLET | Freq: Two times a day (BID) | ORAL | 0 refills | Status: DC | PRN

## 2019-02-01 MED ORDER — FLUOXETINE HCL 40 MG PO CAPS: 80.0000 mg | ORAL_CAPSULE | Freq: Every day | ORAL | 1 refills | Status: DC

## 2019-02-01 MED ORDER — LOSARTAN POTASSIUM 100 MG PO TABS: 100.0000 mg | ORAL_TABLET | Freq: Every day | ORAL | 1 refills | Status: DC

## 2019-02-01 MED ORDER — IBUPROFEN 800 MG PO TABS: 800.0000 mg | ORAL_TABLET | Freq: Three times a day (TID) | ORAL | 1 refills | Status: DC | PRN

## 2019-02-01 NOTE — Assessment & Plan Note (Signed)
Has not been on her losartan. Will restart it and recheck 1 month. Continue other medications. Get blood work done. Call with any concerns.

## 2019-02-01 NOTE — Assessment & Plan Note (Signed)
Has been off her prozac. Will restart it. Will continue her PRN clonazepam and increase her buspar as needed. Recheck 1 month. Call with any concerns. Continue to monitor.

## 2019-02-01 NOTE — Assessment & Plan Note (Signed)
Under good control on current regimen. Continue current regimen. Continue to monitor. Call with any concerns. Refills given. Labs to be drawn.  

## 2019-02-01 NOTE — Progress Notes (Signed)
BP (!) 167/89   Pulse 75   Wt 235 lb (106.6 kg)   BMI 40.34 kg/m    Subjective:    Patient ID: Hannah Kaiser, female    DOB: 04/18/1961, 58 y.o.   MRN: [id number]  HPI: Hannah Kaiser is a 58 y.o. female  Chief Complaint  Patient presents with  . Hypertension  . Hyperlipidemia  . Depression   HYPERTENSION / HYPERLIPIDEMIA- has been out of her losartan Satisfied with current treatment? no Duration of hypertension: chronic BP monitoring frequency: not checking BP medication side effects: no Past BP meds: losartan, atentolol, amlodipine, lasix Duration of hyperlipidemia: chronic Cholesterol medication side effects: no Cholesterol supplements: none Past cholesterol medications: zetia Medication compliance: good compliance Aspirin: no Recent stressors: yes Recurrent headaches: no Visual changes: no Palpitations: no Dyspnea: no Chest pain: no Lower extremity edema: no Dizzy/lightheaded: no  DEPRESSION- has been caring for her brother. Has been out of her prozac and her buspar for about a week Was feeling better while she was on it  Mood status: exacerbated Satisfied with current treatment?: no Symptom severity: moderate  Duration of current treatment : chronic Side effects: no Medication compliance: excellent compliance Psychotherapy/counseling: no  Previous psychiatric medications:  Depressed mood: yes Anxious mood: yes Anhedonia: no Significant weight loss or gain: no Insomnia: yes  Fatigue: yes Feelings of worthlessness or guilt: yes Impaired concentration/indecisiveness: yes Suicidal ideations: no Hopelessness: no Crying spells: yes Depression screen [facility] 2/9 02/01/2019 01/05/2017 09/22/2016 03/27/2016 02/24/2016  Decreased Interest 3 3 3 3 1   Down, Depressed, Hopeless 1 2 3 3 2   PHQ - 2 Score 4 5 6 6 3   Altered sleeping 3 3 3 3 3   Tired, decreased energy 3 3 3 3 3   Change in appetite 3 3 3 3 1   Feeling bad or failure about yourself  0 2 3 3 2   Trouble  concentrating 0 3 3 2 3   Moving slowly or fidgety/restless 2 3 3 3 1   Suicidal thoughts 0 0 3 3 0  PHQ-9 Score 15 22 27 26 16   Difficult doing work/chores Somewhat difficult Somewhat difficult - - -    Relevant past medical, surgical, family and social history reviewed and updated as indicated. Interim medical history since our last visit reviewed. Allergies and medications reviewed and updated.  Review of Systems  Constitutional: Negative.   Respiratory: Negative.   Cardiovascular: Negative.   Gastrointestinal: Negative.   Skin: Negative.   Neurological: Negative.   Psychiatric/Behavioral: Positive for dysphoric mood. Negative for agitation, behavioral problems, confusion, decreased concentration, hallucinations, self-injury, sleep disturbance and suicidal ideas. The patient is nervous/anxious. The patient is not hyperactive.     Per HPI unless specifically indicated above     Objective:    BP (!) 167/89   Pulse 75   Wt 235 lb (106.6 kg)   BMI 40.34 kg/m   Wt Readings from Last 3 Encounters:  02/01/19 235 lb (106.6 kg)  10/24/18 230 lb (104.3 kg)  03/18/18 218 lb 8 oz (99.1 kg)    Physical Exam Vitals signs and nursing note reviewed.  Constitutional:      General: She is not in acute distress.    Appearance: Normal appearance. She is not ill-appearing, toxic-appearing or diaphoretic.  HENT:     Head: Normocephalic and atraumatic.     Right Ear: External ear normal.     Left Ear: External ear normal.     Nose: Nose normal.  Mouth/Throat:     Mouth: Mucous membranes are moist.     Pharynx: Oropharynx is clear.  Eyes:     General: No scleral icterus.       Right eye: No discharge.        Left eye: No discharge.     Conjunctiva/sclera: Conjunctivae normal.     Pupils: Pupils are equal, round, and reactive to light.  Neck:     Musculoskeletal: Normal range of motion.  Pulmonary:     Effort: Pulmonary effort is normal. No respiratory distress.     Comments:  Speaking in full sentences Musculoskeletal: Normal range of motion.  Skin:    Coloration: Skin is not jaundiced or pale.     Findings: No bruising, erythema, lesion or rash.  Neurological:     Mental Status: She is alert and oriented to person, place, and time. Mental status is at baseline.  Psychiatric:        Mood and Affect: Mood normal.        Behavior: Behavior normal.        Thought Content: Thought content normal.        Judgment: Judgment normal.     Results for orders placed or performed in visit on 01/16/19  Novel Coronavirus, NAA ([facility])   Specimen: Oropharyngeal(OP) collection in vial transport medium   OROPHARYNGEA  TESTING  Result Value Ref Range   SARS-CoV-2, NAA Not Detected Not Detected      Assessment & Plan:   Problem List Items Addressed This Visit      Genitourinary   Benign hypertensive renal disease - Primary    Has not been on her losartan. Will restart it and recheck 1 month. Continue other medications. Get blood work done. Call with any concerns.       Relevant Orders   Comprehensive metabolic panel     Other   Hyperlipidemia    Under good control on current regimen. Continue current regimen. Continue to monitor. Call with any concerns. Refills given. Labs to be drawn.        Relevant Medications   losartan (COZAAR) 100 MG tablet   Other Relevant Orders   Comprehensive metabolic panel   Lipid Panel w/o Chol/HDL Ratio   Severe episode of recurrent major depressive disorder, without psychotic features ([place])    Has been off her prozac. Will restart it. Will continue her PRN clonazepam and increase her buspar as needed. Recheck 1 month. Call with any concerns. Continue to monitor.       Relevant Medications   busPIRone (BUSPAR) 5 MG tablet   FLUoxetine (PROZAC) 40 MG capsule       Follow up plan: Return in about 4 weeks (around 03/01/2019).   . This visit was completed via Doximity due to the restrictions of the COVID-19 pandemic. All  issues as above were discussed and addressed. Physical exam was done as above through visual confirmation on Doximity. If it was felt that the patient should be evaluated in the office, they were directed there. The patient verbally consented to this visit. . Location of the patient: home . Location of the provider: home . Those involved with this call:  . Provider: Park Liter, DO . CMA: Yvonna Alanis, [place] . Front Desk/Registration: Don Perking  . Time spent on call: 25  minutes with patient face to face via video conference. More than 50% of this time was spent in counseling and coordination of care. 40 minutes total spent in review of patient's  record and preparation of their chart.

## 2019-03-01 ENCOUNTER — Other Ambulatory Visit: Payer: Self-pay

## 2019-03-01 ENCOUNTER — Encounter: Payer: Self-pay | Admitting: Family Medicine

## 2019-03-01 ENCOUNTER — Ambulatory Visit (INDEPENDENT_AMBULATORY_CARE_PROVIDER_SITE_OTHER): Payer: Self-pay | Admitting: Family Medicine

## 2019-03-01 DIAGNOSIS — I129 Hypertensive chronic kidney disease with stage 1 through stage 4 chronic kidney disease, or unspecified chronic kidney disease: Secondary | ICD-10-CM

## 2019-03-01 DIAGNOSIS — F332 Major depressive disorder, recurrent severe without psychotic features: Secondary | ICD-10-CM

## 2019-03-01 MED ORDER — ATENOLOL 50 MG PO TABS: 50.0000 mg | ORAL_TABLET | Freq: Every day | ORAL | 1 refills | Status: DC

## 2019-03-01 MED ORDER — METOPROLOL SUCCINATE ER 25 MG PO TB24: 25.0000 mg | ORAL_TABLET | Freq: Every day | ORAL | 3 refills | Status: DC

## 2019-03-01 NOTE — Assessment & Plan Note (Signed)
Not under good control. We will increase atenolol to 50mg  and recheck 1 month. Call with any concerns. Continue to monitor.

## 2019-03-01 NOTE — Progress Notes (Signed)
BP (!) 158/88 Comment: pt reported  Pulse 74   Wt 234 lb 12.8 oz (106.5 kg)   BMI 40.30 kg/m    Subjective:    Patient ID: Hannah Kaiser, female    DOB: 04/30/1961, 58 y.o.   MRN: [id number]  HPI: Hannah Kaiser is a 58 y.o. female  Chief Complaint  Patient presents with  . Depression  . Hypertension   HYPERTENSION Hypertension status: better  Satisfied with current treatment? no Duration of hypertension: chronic BP monitoring frequency:  not checking BP medication side effects:  no Medication compliance: excellent compliance Previous BP meds:lasix, losartan, amlodipine, atenolol Aspirin: no Recurrent headaches: no Visual changes: no Palpitations: no Dyspnea: no Chest pain: no Lower extremity edema: no Dizzy/lightheaded: no  DEPRESSION Mood status: better Satisfied with current treatment?: yes Symptom severity: moderate  Duration of current treatment : restarted about a month ago Side effects: no Medication compliance: good compliance Psychotherapy/counseling: no  Previous psychiatric medications: prozac, seroquel Depressed mood: yes Anxious mood: yes Anhedonia: no Significant weight loss or gain: no Insomnia: yes  Fatigue: yes Feelings of worthlessness or guilt: no Impaired concentration/indecisiveness: no Suicidal ideations: no Hopelessness: no Crying spells: no Depression screen [facility] 2/9 03/01/2019 02/01/2019 01/05/2017 09/22/2016 03/27/2016  Decreased Interest 1 3 3 3 3   Down, Depressed, Hopeless 0 1 2 3 3   PHQ - 2 Score 1 4 5 6 6   Altered sleeping 3 3 3 3 3   Tired, decreased energy 3 3 3 3 3   Change in appetite 3 3 3 3 3   Feeling bad or failure about yourself  0 0 2 3 3   Trouble concentrating 0 0 3 3 2   Moving slowly or fidgety/restless 0 2 3 3 3   Suicidal thoughts 0 0 0 3 3  PHQ-9 Score 10 15 22 27 26   Difficult doing work/chores Very difficult Somewhat difficult Somewhat difficult - -    Relevant past medical, surgical, family and social history  reviewed and updated as indicated. Interim medical history since our last visit reviewed. Allergies and medications reviewed and updated.  Review of Systems  Constitutional: Negative.   Respiratory: Negative.   Cardiovascular: Negative.   Musculoskeletal: Negative.   Neurological: Negative.   Psychiatric/Behavioral: Negative.     Per HPI unless specifically indicated above     Objective:    BP (!) 158/88 Comment: pt reported  Pulse 74   Wt 234 lb 12.8 oz (106.5 kg)   BMI 40.30 kg/m   Wt Readings from Last 3 Encounters:  03/01/19 234 lb 12.8 oz (106.5 kg)  02/01/19 235 lb (106.6 kg)  10/24/18 230 lb (104.3 kg)    Physical Exam Vitals signs and nursing note reviewed.  Constitutional:      General: She is not in acute distress.    Appearance: Normal appearance. She is not ill-appearing, toxic-appearing or diaphoretic.  HENT:     Head: Normocephalic and atraumatic.     Right Ear: External ear normal.     Left Ear: External ear normal.     Nose: Nose normal.     Mouth/Throat:     Mouth: Mucous membranes are moist.     Pharynx: Oropharynx is clear.  Eyes:     General: No scleral icterus.       Right eye: No discharge.        Left eye: No discharge.     Extraocular Movements: Extraocular movements intact.     Conjunctiva/sclera: Conjunctivae normal.     Pupils:  Pupils are equal, round, and reactive to light.  Neck:     Musculoskeletal: Normal range of motion and neck supple.  Cardiovascular:     Rate and Rhythm: Normal rate and regular rhythm.     Pulses: Normal pulses.     Heart sounds: Normal heart sounds. No murmur. No friction rub. No gallop.   Pulmonary:     Effort: Pulmonary effort is normal. No respiratory distress.     Breath sounds: Normal breath sounds. No stridor. No wheezing, rhonchi or rales.  Chest:     Chest wall: No tenderness.  Musculoskeletal: Normal range of motion.  Skin:    General: Skin is warm and dry.     Capillary Refill: Capillary refill  takes less than 2 seconds.     Coloration: Skin is not jaundiced or pale.     Findings: No bruising, erythema, lesion or rash.  Neurological:     General: No focal deficit present.     Mental Status: She is alert and oriented to person, place, and time. Mental status is at baseline.  Psychiatric:        Mood and Affect: Mood normal.        Behavior: Behavior normal.        Thought Content: Thought content normal.        Judgment: Judgment normal.     Results for orders placed or performed in visit on 01/16/19  Novel Coronavirus, NAA ([facility])   Specimen: Oropharyngeal(OP) collection in vial transport medium   OROPHARYNGEA  TESTING  Result Value Ref Range   SARS-CoV-2, NAA Not Detected Not Detected      Assessment & Plan:   Problem List Items Addressed This Visit      Genitourinary   Benign hypertensive renal disease    Not under good control. We will increase atenolol to 50mg  and recheck 1 month. Call with any concerns. Continue to monitor.        Other   Severe episode of recurrent major depressive disorder, without psychotic features ([place])    Doing much better on her prozac. Still very tired. Will continue current regimen and recheck 1 month. Due to fatigue, would benefit from blood work- will wait for her insurance to activate to do labs.           Follow up plan: Return in about 4 weeks (around 03/29/2019).    . This visit was completed via Doximity due to the restrictions of the COVID-19 pandemic. All issues as above were discussed and addressed. Physical exam was done as above through visual confirmation on Doximity. If it was felt that the patient should be evaluated in the office, they were directed there. The patient verbally consented to this visit. . Location of the patient: home . Location of the provider: home . Those involved with this call:  . Provider: Park Liter, DO . CMA: Yvonna Alanis, [place] . Front Desk/Registration: Don Perking  .  Time spent on call: 25 minutes with patient face to face via video conference. More than 50% of this time was spent in counseling and coordination of care. 40 minutes total spent in review of patient's record and preparation of their chart.

## 2019-03-01 NOTE — Assessment & Plan Note (Signed)
Doing much better on her prozac. Still very tired. Will continue current regimen and recheck 1 month. Due to fatigue, would benefit from blood work- will wait for her insurance to activate to do labs.

## 2019-03-29 ENCOUNTER — Ambulatory Visit: Payer: Self-pay | Admitting: Family Medicine

## 2019-04-05 ENCOUNTER — Ambulatory Visit: Payer: Self-pay | Admitting: Family Medicine

## 2019-06-05 ENCOUNTER — Other Ambulatory Visit: Payer: Self-pay | Admitting: Family Medicine

## 2019-06-05 NOTE — Telephone Encounter (Signed)
Appointment scheduled for 06/15/19.

## 2019-06-05 NOTE — Telephone Encounter (Signed)
Needs appointment

## 2019-06-15 ENCOUNTER — Encounter: Payer: Self-pay | Admitting: Family Medicine

## 2019-06-15 ENCOUNTER — Other Ambulatory Visit: Payer: Self-pay

## 2019-06-15 ENCOUNTER — Ambulatory Visit (INDEPENDENT_AMBULATORY_CARE_PROVIDER_SITE_OTHER): Payer: Self-pay | Admitting: Family Medicine

## 2019-06-15 VITALS — BP 173/86 | HR 60 | Wt 239.4 lb

## 2019-06-15 DIAGNOSIS — I129 Hypertensive chronic kidney disease with stage 1 through stage 4 chronic kidney disease, or unspecified chronic kidney disease: Secondary | ICD-10-CM

## 2019-06-15 DIAGNOSIS — I2510 Atherosclerotic heart disease of native coronary artery without angina pectoris: Secondary | ICD-10-CM

## 2019-06-15 DIAGNOSIS — E782 Mixed hyperlipidemia: Secondary | ICD-10-CM

## 2019-06-15 DIAGNOSIS — R079 Chest pain, unspecified: Secondary | ICD-10-CM

## 2019-06-15 DIAGNOSIS — F332 Major depressive disorder, recurrent severe without psychotic features: Secondary | ICD-10-CM

## 2019-06-15 MED ORDER — VALACYCLOVIR HCL 1 G PO TABS: 1000.0000 mg | ORAL_TABLET | Freq: Every day | ORAL | 1 refills | Status: DC

## 2019-06-15 MED ORDER — FLUOXETINE HCL 40 MG PO CAPS: 80.0000 mg | ORAL_CAPSULE | Freq: Every day | ORAL | 1 refills | Status: DC

## 2019-06-15 MED ORDER — LOSARTAN POTASSIUM 100 MG PO TABS: 100.0000 mg | ORAL_TABLET | Freq: Every day | ORAL | 1 refills | Status: DC

## 2019-06-15 MED ORDER — PANTOPRAZOLE SODIUM 40 MG PO TBEC: 40.0000 mg | DELAYED_RELEASE_TABLET | Freq: Every day | ORAL | 1 refills | Status: DC

## 2019-06-15 MED ORDER — EZETIMIBE 10 MG PO TABS: 10.0000 mg | ORAL_TABLET | Freq: Every day | ORAL | 1 refills | Status: DC

## 2019-06-15 MED ORDER — QUETIAPINE FUMARATE 25 MG PO TABS: 25.0000 mg | ORAL_TABLET | Freq: Every day | ORAL | 3 refills | Status: DC

## 2019-06-15 MED ORDER — AMLODIPINE BESYLATE 10 MG PO TABS: 10.0000 mg | ORAL_TABLET | Freq: Every day | ORAL | 1 refills | Status: DC

## 2019-06-15 MED ORDER — CLONAZEPAM 1 MG PO TABS: 1.0000 mg | ORAL_TABLET | Freq: Two times a day (BID) | ORAL | 1 refills | Status: DC | PRN

## 2019-06-15 MED ORDER — BUSPIRONE HCL 5 MG PO TABS: 5.0000 mg | ORAL_TABLET | Freq: Three times a day (TID) | ORAL | 1 refills | Status: DC

## 2019-06-15 MED ORDER — ATENOLOL 50 MG PO TABS: 50.0000 mg | ORAL_TABLET | Freq: Every day | ORAL | 1 refills | Status: DC

## 2019-06-15 MED ORDER — ISOSORBIDE MONONITRATE ER 30 MG PO TB24: 30.0000 mg | ORAL_TABLET | Freq: Every day | ORAL | 3 refills | Status: DC

## 2019-06-15 MED ORDER — HYDROXYZINE HCL 25 MG PO TABS: 25.0000 mg | ORAL_TABLET | Freq: Three times a day (TID) | ORAL | 6 refills | Status: DC | PRN

## 2019-06-15 MED ORDER — FUROSEMIDE 20 MG PO TABS: ORAL_TABLET | ORAL | 3 refills | Status: DC

## 2019-06-15 NOTE — Progress Notes (Signed)
BP (!) 173/86   Pulse 60   Wt 239 lb 6 oz (108.6 kg)   SpO2 98%   BMI 41.09 kg/m    Subjective:    Patient ID: Hannah Kaiser, female    DOB: 02/13/1961, 59 y.o.   MRN: [id number]  HPI: Hannah Kaiser is a 59 y.o. female  Chief Complaint  Patient presents with  . Depression    Patient would like a refill on Klonopin  . Hypertension  . Medication Refill    Patient would l;ike a prescription for flexeril    DEPRESSION Mood status: exacerbated Satisfied with current treatment?: no Symptom severity: severe  Duration of current treatment : chronic Side effects: no Medication compliance: excellent compliance Psychotherapy/counseling: no  Previous psychiatric medications: prozac Depressed mood: yes Anxious mood: yes Anhedonia: no Significant weight loss or gain: no Insomnia: yes  Fatigue: yes Feelings of worthlessness or guilt: yes Impaired concentration/indecisiveness: yes Suicidal ideations: no Hopelessness: yes Crying spells: yes Depression screen [facility] 2/9 06/15/2019 03/01/2019 02/01/2019 01/05/2017 09/22/2016  Decreased Interest 3 1 3 3 3   Down, Depressed, Hopeless 3 0 1 2 3   PHQ - 2 Score 6 1 4 5 6   Altered sleeping 3 3 3 3 3   Tired, decreased energy 3 3 3 3 3   Change in appetite 3 3 3 3 3   Feeling bad or failure about yourself  0 0 0 2 3  Trouble concentrating 1 0 0 3 3  Moving slowly or fidgety/restless 0 0 2 3 3   Suicidal thoughts 0 0 0 0 3  PHQ-9 Score 16 10 15 22 27   Difficult doing work/chores Extremely dIfficult Very difficult Somewhat difficult Somewhat difficult -   GAD 7 : Generalized Anxiety Score 06/15/2019 03/01/2019 02/01/2019 01/05/2017  Nervous, Anxious, on Edge 0 2 3 3   Control/stop worrying 0 0 3 1  Worry too much - different things 0 0 3 1  Trouble relaxing 1 1 3 3   Restless 0 1 0 1  Easily annoyed or irritable 3 3 3 3   Afraid - awful might happen 0 0 0 1  Total GAD 7 Score 4 7 15 13   Anxiety Difficulty Somewhat difficult Somewhat difficult Somewhat  difficult Somewhat difficult   HYPERTENSION / HYPERLIPIDEMIA- did not take her blood pressure medicine this AM Satisfied with current treatment? no Duration of hypertension: chronic BP monitoring frequency: a few times a month BP medication side effects: no Past BP meds: atenolol, lasix, losartan, amlodipine Duration of hyperlipidemia: chronic Cholesterol medication side effects: no Cholesterol supplements: none Past cholesterol medications: zetia Medication compliance: good compliance Aspirin: no Recent stressors: yes Recurrent headaches: no Visual changes: no Palpitations: yes Dyspnea: yes Chest pain: yes - several weeks, pain in L arm through breast and into her R arm Lower extremity edema: no Dizzy/lightheaded: no   CHEST PAIN Duration:couple of weeks Onset: sudden Quality: sharp and pressure Severity: severe Location: upper left Radiation: bilateral arms Episode duration:  Frequency: intermittent Related to exertion: yes Activity when pain started: cleaning Trauma: no Anxiety/recent stressors: yes Status: worse Treatments attempted: nothing  Current pain status: in pain Shortness of breath: yes Cough: no Nausea: no Diaphoresis: no Heartburn: no Palpitations: yes   Relevant past medical, surgical, family and social history reviewed and updated as indicated. Interim medical history since our last visit reviewed. Allergies and medications reviewed and updated.  Review of Systems  Constitutional: Negative.   HENT: Negative.   Respiratory: Positive for chest tightness and shortness of breath.  Negative for apnea, cough, choking, wheezing and stridor.   Cardiovascular: Positive for chest pain. Negative for palpitations and leg swelling.  Gastrointestinal: Negative.   Psychiatric/Behavioral: Positive for agitation and dysphoric mood. Negative for behavioral problems, confusion, decreased concentration, hallucinations, self-injury, sleep disturbance and suicidal  ideas. The patient is nervous/anxious. The patient is not hyperactive.     Per HPI unless specifically indicated above     Objective:    BP (!) 173/86   Pulse 60   Wt 239 lb 6 oz (108.6 kg)   SpO2 98%   BMI 41.09 kg/m   Wt Readings from Last 3 Encounters:  06/15/19 239 lb 6 oz (108.6 kg)  03/01/19 234 lb 12.8 oz (106.5 kg)  02/01/19 235 lb (106.6 kg)    Physical Exam Vitals and nursing note reviewed.  Constitutional:      General: She is not in acute distress.    Appearance: Normal appearance. She is not ill-appearing, toxic-appearing or diaphoretic.  HENT:     Head: Normocephalic and atraumatic.     Right Ear: External ear normal.     Left Ear: External ear normal.     Nose: Nose normal.     Mouth/Throat:     Mouth: Mucous membranes are moist.     Pharynx: Oropharynx is clear.  Eyes:     General: No scleral icterus.       Right eye: No discharge.        Left eye: No discharge.     Conjunctiva/sclera: Conjunctivae normal.     Pupils: Pupils are equal, round, and reactive to light.  Pulmonary:     Effort: Pulmonary effort is normal. No respiratory distress.     Comments: Speaking in full sentences Musculoskeletal:        General: Normal range of motion.     Cervical back: Normal range of motion.  Skin:    Coloration: Skin is not jaundiced or pale.     Findings: No bruising, erythema, lesion or rash.  Neurological:     Mental Status: She is alert and oriented to person, place, and time. Mental status is at baseline.  Psychiatric:        Mood and Affect: Mood normal.        Behavior: Behavior normal.        Thought Content: Thought content normal.        Judgment: Judgment normal.     Results for orders placed or performed in visit on 01/16/19  Novel Coronavirus, NAA ([facility])   Specimen: Oropharyngeal(OP) collection in vial transport medium   OROPHARYNGEA  TESTING  Result Value Ref Range   SARS-CoV-2, NAA Not Detected Not Detected      Assessment & Plan:    Problem List Items Addressed This Visit      Cardiovascular and Mediastinum   CAD (coronary artery disease)    40% blockages in 2018- continue medical therapy. Will get her back into see cardiology given her pain. Call with any concerns. Warning signs for which to go to the ER discussed.       Relevant Medications   furosemide (LASIX) 20 MG tablet   losartan (COZAAR) 100 MG tablet   amLODipine (NORVASC) 10 MG tablet   atenolol (TENORMIN) 50 MG tablet   ezetimibe (ZETIA) 10 MG tablet   isosorbide mononitrate (IMDUR) 30 MG 24 hr tablet     Genitourinary   Benign hypertensive renal disease    Elevated today, but did not take her BP medicine-  on recheck 145/83. Will start 73m imdur and recheck 2 weeks. Call with any concerns.       Relevant Orders   Ambulatory referral to Cardiology   Comp Met (CMET)   Microalbumin, Urine Waived   UA/M w/rflx Culture, Routine     Other   Hyperlipidemia    Will get her in for labs ASAP. Await results.       Relevant Medications   furosemide (LASIX) 20 MG tablet   losartan (COZAAR) 100 MG tablet   amLODipine (NORVASC) 10 MG tablet   atenolol (TENORMIN) 50 MG tablet   ezetimibe (ZETIA) 10 MG tablet   isosorbide mononitrate (IMDUR) 30 MG 24 hr tablet   Other Relevant Orders   Ambulatory referral to Cardiology   Comp Met (CMET)   Lipid Panel w/o Chol/HDL Ratio OUT   Severe episode of recurrent major depressive disorder, without psychotic features ([place]    Not doing great. Under a lot of stress given her living situation. Will continue prozac and add seroquel at bedtime. Refill of her klonopin given today- try to take sparingly. Call with any concerns.       Relevant Medications   FLUoxetine (PROZAC) 40 MG capsule   hydrOXYzine (ATARAX/VISTARIL) 25 MG tablet   busPIRone (BUSPAR) 5 MG tablet   Other Relevant Orders   Comp Met (CMET)   TSH    Other Visit Diagnoses    Chest pain, unspecified type    -  Primary   Given history of CAD  and chest pain will get her into cardiology ASAP- she has no insurance, [facility]    Relevant Orders   Ambulatory referral to Cardiology   Comp Met (CMET)   Morbid obesity ([place]       Will work on diet and exercise with goal of losing 1-2lbs per week. Call with any concerns.    Relevant Orders   Bayer DCA Hb A1c Waived   Comp Met (CMET)       Follow up plan: Return in about 2 weeks (around 06/29/2019).   . This visit was completed via Doximity due to the restrictions of the COVID-19 pandemic. All issues as above were discussed and addressed. Physical exam was done as above through visual confirmation on Doximity. If it was felt that the patient should be evaluated in the office, they were directed there. The patient verbally consented to this visit. . Location of the patient: home . Location of the provider: work . Those involved with this call:  . Provider: MPark Liter DO . CMA: Tiffany Ketterman, CMA . Front Desk/Registration: CDon Perking . Time spent on call: 25 minutes with patient face to face via video conference. More than 50% of this time was spent in counseling and coordination of care. 40 minutes total spent in review of patient's record and preparation of their chart.

## 2019-06-16 ENCOUNTER — Encounter: Payer: Self-pay | Admitting: Family Medicine

## 2019-06-16 MED ORDER — ISOSORBIDE MONONITRATE ER 30 MG PO TB24: 15.0000 mg | ORAL_TABLET | Freq: Every day | ORAL | 3 refills | Status: DC

## 2019-06-16 NOTE — Assessment & Plan Note (Signed)
40% blockages in 2018- continue medical therapy. Will get her back into see cardiology given her pain. Call with any concerns. Warning signs for which to go to the ER discussed.  

## 2019-06-16 NOTE — Assessment & Plan Note (Signed)
Will get her in for labs ASAP. Await results.

## 2019-06-16 NOTE — Assessment & Plan Note (Signed)
Not doing great. Under a lot of stress given her living situation. Will continue prozac and add seroquel at bedtime. Refill of her klonopin given today- try to take sparingly. Call with any concerns.

## 2019-06-16 NOTE — Assessment & Plan Note (Signed)
Elevated today, but did not take her BP medicine- on recheck 145/83. Will start 15mg  imdur and recheck 2 weeks. Call with any concerns.

## 2019-06-27 ENCOUNTER — Ambulatory Visit: Payer: PRIVATE HEALTH INSURANCE | Attending: Internal Medicine

## 2019-06-27 DIAGNOSIS — Z20822 Contact with and (suspected) exposure to covid-19: Secondary | ICD-10-CM | POA: Insufficient documentation

## 2019-06-28 LAB — NOVEL CORONAVIRUS, NAA: SARS-CoV-2, NAA: NOT DETECTED

## 2019-07-06 ENCOUNTER — Ambulatory Visit: Payer: Self-pay | Admitting: Family Medicine

## 2019-09-21 ENCOUNTER — Ambulatory Visit: Payer: Self-pay | Attending: Internal Medicine

## 2019-09-21 DIAGNOSIS — Z20822 Contact with and (suspected) exposure to covid-19: Secondary | ICD-10-CM | POA: Insufficient documentation

## 2019-09-22 LAB — NOVEL CORONAVIRUS, NAA: SARS-CoV-2, NAA: NOT DETECTED

## 2019-09-22 LAB — SARS-COV-2, NAA 2 DAY TAT

## 2019-10-28 DIAGNOSIS — R072 Precordial pain: Secondary | ICD-10-CM | POA: Insufficient documentation

## 2019-10-30 MED ORDER — NITROGLYCERIN 0.4 MG SL SUBL
0.40 | SUBLINGUAL_TABLET | SUBLINGUAL | Status: DC
Start: ? — End: 2019-10-30

## 2019-10-31 DIAGNOSIS — F419 Anxiety disorder, unspecified: Secondary | ICD-10-CM | POA: Insufficient documentation

## 2019-11-01 MED ORDER — AMLODIPINE BESYLATE 10 MG PO TABS
10.00 | ORAL_TABLET | ORAL | Status: DC
Start: 2019-11-02 — End: 2019-11-01

## 2019-11-01 MED ORDER — DIPHENHYDRAMINE HCL 25 MG PO CAPS
25.00 | ORAL_CAPSULE | ORAL | Status: DC
Start: ? — End: 2019-11-01

## 2019-11-01 MED ORDER — GUAIFENESIN 100 MG/5ML PO SYRP
200.00 | ORAL_SOLUTION | ORAL | Status: DC
Start: ? — End: 2019-11-01

## 2019-11-01 MED ORDER — PANTOPRAZOLE SODIUM 40 MG PO TBEC
40.00 | DELAYED_RELEASE_TABLET | ORAL | Status: DC
Start: 2019-11-02 — End: 2019-11-01

## 2019-11-01 MED ORDER — ALBUTEROL SULFATE HFA 108 (90 BASE) MCG/ACT IN AERS
2.00 | INHALATION_SPRAY | RESPIRATORY_TRACT | Status: DC
Start: ? — End: 2019-11-01

## 2019-11-01 MED ORDER — BUSPIRONE HCL 10 MG PO TABS
5.00 | ORAL_TABLET | ORAL | Status: DC
Start: 2019-11-01 — End: 2019-11-01

## 2019-11-01 MED ORDER — ASPIRIN 81 MG PO CHEW
81.00 | CHEWABLE_TABLET | ORAL | Status: DC
Start: 2019-11-02 — End: 2019-11-01

## 2019-11-01 MED ORDER — FLUOXETINE HCL 20 MG PO CAPS
40.00 | ORAL_CAPSULE | ORAL | Status: DC
Start: 2019-11-01 — End: 2019-11-01

## 2019-11-01 MED ORDER — IBUPROFEN 400 MG PO TABS
800.00 | ORAL_TABLET | ORAL | Status: DC
Start: ? — End: 2019-11-01

## 2019-11-01 MED ORDER — POLYETHYLENE GLYCOL 3350 17 GM/SCOOP PO POWD
17.00 | ORAL | Status: DC
Start: ? — End: 2019-11-01

## 2019-11-01 MED ORDER — ATENOLOL 25 MG PO TABS
25.00 | ORAL_TABLET | ORAL | Status: DC
Start: 2019-11-02 — End: 2019-11-01

## 2019-11-01 MED ORDER — ACETAMINOPHEN 325 MG PO TABS
650.00 | ORAL_TABLET | ORAL | Status: DC
Start: ? — End: 2019-11-01

## 2019-11-01 MED ORDER — ALUM & MAG HYDROXIDE-SIMETH 400-400-40 MG/5ML PO SUSP
30.00 | ORAL | Status: DC
Start: ? — End: 2019-11-01

## 2019-11-01 MED ORDER — CLONAZEPAM 0.5 MG PO TABS
1.00 | ORAL_TABLET | ORAL | Status: DC
Start: ? — End: 2019-11-01

## 2019-11-01 MED ORDER — LOSARTAN POTASSIUM 100 MG PO TABS
100.00 | ORAL_TABLET | ORAL | Status: DC
Start: 2019-11-02 — End: 2019-11-01

## 2019-11-01 MED ORDER — MELATONIN 3 MG PO TABS
3.00 | ORAL_TABLET | ORAL | Status: DC
Start: ? — End: 2019-11-01

## 2019-11-01 MED ORDER — QUETIAPINE FUMARATE 25 MG PO TABS
25.00 | ORAL_TABLET | ORAL | Status: DC
Start: 2019-11-01 — End: 2019-11-01

## 2020-06-13 ENCOUNTER — Other Ambulatory Visit: Payer: Self-pay

## 2020-06-13 DIAGNOSIS — R059 Cough, unspecified: Secondary | ICD-10-CM

## 2020-06-13 DIAGNOSIS — Z20822 Contact with and (suspected) exposure to covid-19: Secondary | ICD-10-CM

## 2020-06-13 NOTE — Progress Notes (Signed)
covid swab

## 2020-06-15 LAB — NOVEL CORONAVIRUS, NAA: SARS-CoV-2, NAA: NOT DETECTED

## 2020-06-15 LAB — SARS-COV-2, NAA 2 DAY TAT

## 2020-08-29 ENCOUNTER — Ambulatory Visit: Payer: PRIVATE HEALTH INSURANCE | Admitting: Nurse Practitioner

## 2020-08-29 ENCOUNTER — Telehealth: Payer: Self-pay | Admitting: Nurse Practitioner

## 2020-08-29 DIAGNOSIS — I129 Hypertensive chronic kidney disease with stage 1 through stage 4 chronic kidney disease, or unspecified chronic kidney disease: Secondary | ICD-10-CM

## 2020-08-29 DIAGNOSIS — J449 Chronic obstructive pulmonary disease, unspecified: Secondary | ICD-10-CM

## 2020-08-29 DIAGNOSIS — G4733 Obstructive sleep apnea (adult) (pediatric): Secondary | ICD-10-CM | POA: Insufficient documentation

## 2020-08-29 DIAGNOSIS — E782 Mixed hyperlipidemia: Secondary | ICD-10-CM

## 2020-08-29 DIAGNOSIS — I10 Essential (primary) hypertension: Secondary | ICD-10-CM | POA: Insufficient documentation

## 2020-08-29 NOTE — Assessment & Plan Note (Deleted)
Chronic.  Uncontrolled.  Will resume medications as patient has been out of them.  Recommend seeing Cardiology for further titration of medications.

## 2020-08-29 NOTE — Telephone Encounter (Signed)
Labs entered.

## 2020-08-29 NOTE — Assessment & Plan Note (Deleted)
Chronic.  Not using CPAP.  Has not used CPAP in 5-6 years.  Recommend referral to sleep medicine to have CPAP titrated.

## 2020-08-29 NOTE — Progress Notes (Deleted)
   There were no vitals taken for this visit.   Subjective:    Patient ID: Hannah Kaiser, female    DOB: 12-Oct-1960, 60 y.o.   MRN: [id number]  HPI: Hannah Kaiser is a 60 y.o. female  No chief complaint on file.  DEPRESSION  HYPERTENSION / HYPERLIPIDEMIA Satisfied with current treatment? {Blank single:19197::"yes","no"} Duration of hypertension: {Blank single:19197::"chronic","months","years"} BP monitoring frequency: {Blank single:19197::"not checking","rarely","daily","weekly","monthly","a few times a day","a few times a week","a few times a month"} BP range:  BP medication side effects: {Blank single:19197::"yes","no"} Past BP meds: atenolol, losartan, norvasc and Imdur. Duration of hyperlipidemia: years Cholesterol medication side effects: yes Cholesterol supplements: none Past cholesterol medications: does not tolerate Statins. On ZETIA. Cardiology recommended Crestor 5mg  every other day.  Medication compliance: {Blank single:19197::"excellent compliance","good compliance","fair compliance","poor compliance"} Aspirin: {Blank single:19197::"yes","no"} Recent stressors: {Blank single:19197::"yes","no"} Recurrent headaches: {Blank single:19197::"yes","no"} Visual changes: {Blank single:19197::"yes","no"} Palpitations: {Blank single:19197::"yes","no"} Dyspnea: {Blank single:19197::"yes","no"} Chest pain: {Blank single:19197::"yes","no"} Lower extremity edema: {Blank single:19197::"yes","no"} Dizzy/lightheaded: {Blank single:19197::"yes","no"}  Relevant past medical, surgical, family and social history reviewed and updated as indicated. Interim medical history since our last visit reviewed. Allergies and medications reviewed and updated.  Review of Systems  Per HPI unless specifically indicated above     Objective:    There were no vitals taken for this visit.  Wt Readings from Last 3 Encounters:  06/15/19 239 lb 6 oz (108.6 kg)  03/01/19 234 lb 12.8 oz (106.5 kg)   02/01/19 235 lb (106.6 kg)    Physical Exam  Results for orders placed or performed in visit on 06/13/20  Novel Coronavirus, NAA ([facility])   Specimen: Nasopharyngeal(NP) swabs in vial transport medium  Result Value Ref Range   SARS-CoV-2, NAA Not Detected Not Detected  SARS-COV-2, NAA 2 DAY TAT  Result Value Ref Range   SARS-CoV-2, NAA 2 DAY TAT Performed       Assessment & Plan:   Problem List Items Addressed This Visit      Cardiovascular and Mediastinum   CAD (coronary artery disease)     Other   Hyperlipidemia   Severe episode of recurrent major depressive disorder, without psychotic features ([place]) - Primary       Follow up plan: No follow-ups on file.

## 2020-08-29 NOTE — Assessment & Plan Note (Deleted)
Chronic.  Ongoing.  Patient has been out of medication.

## 2020-08-29 NOTE — Assessment & Plan Note (Deleted)
Chronic.  Has been off of medication due to losing insurance and cost. Has not tolerated statins in the past.  Was previously on Zetia.  Cardiology recommended patient start Crestor 5mg  every other day. Patient never started medication. Refills and labs ordered today.

## 2020-08-30 ENCOUNTER — Encounter: Payer: Self-pay | Admitting: Nurse Practitioner

## 2020-08-30 ENCOUNTER — Other Ambulatory Visit (HOSPITAL_COMMUNITY): Payer: Self-pay | Admitting: Nurse Practitioner

## 2020-08-30 ENCOUNTER — Ambulatory Visit (INDEPENDENT_AMBULATORY_CARE_PROVIDER_SITE_OTHER): Payer: 59 | Admitting: Nurse Practitioner

## 2020-08-30 ENCOUNTER — Other Ambulatory Visit: Payer: Self-pay

## 2020-08-30 VITALS — BP 186/107 | HR 77 | Temp 98.8°F | Ht 64.06 in | Wt 240.5 lb

## 2020-08-30 DIAGNOSIS — Z1231 Encounter for screening mammogram for malignant neoplasm of breast: Secondary | ICD-10-CM

## 2020-08-30 DIAGNOSIS — F332 Major depressive disorder, recurrent severe without psychotic features: Secondary | ICD-10-CM | POA: Diagnosis not present

## 2020-08-30 DIAGNOSIS — I129 Hypertensive chronic kidney disease with stage 1 through stage 4 chronic kidney disease, or unspecified chronic kidney disease: Secondary | ICD-10-CM | POA: Diagnosis not present

## 2020-08-30 DIAGNOSIS — E782 Mixed hyperlipidemia: Secondary | ICD-10-CM

## 2020-08-30 DIAGNOSIS — J449 Chronic obstructive pulmonary disease, unspecified: Secondary | ICD-10-CM | POA: Diagnosis not present

## 2020-08-30 DIAGNOSIS — R350 Frequency of micturition: Secondary | ICD-10-CM

## 2020-08-30 DIAGNOSIS — I2510 Atherosclerotic heart disease of native coronary artery without angina pectoris: Secondary | ICD-10-CM

## 2020-08-30 DIAGNOSIS — M545 Low back pain, unspecified: Secondary | ICD-10-CM

## 2020-08-30 LAB — URINALYSIS, ROUTINE W REFLEX MICROSCOPIC
Bilirubin, UA: NEGATIVE
Glucose, UA: NEGATIVE
Ketones, UA: NEGATIVE
Leukocytes,UA: NEGATIVE
Nitrite, UA: NEGATIVE
Protein,UA: NEGATIVE
Specific Gravity, UA: 1.02 (ref 1.005–1.030)
Urobilinogen, Ur: 0.2 mg/dL (ref 0.2–1.0)
pH, UA: 6 (ref 5.0–7.5)

## 2020-08-30 LAB — MICROSCOPIC EXAMINATION
Bacteria, UA: NONE SEEN
WBC, UA: NONE SEEN /hpf (ref 0–5)

## 2020-08-30 MED ORDER — BUSPIRONE HCL 5 MG PO TABS: 5.0000 mg | ORAL_TABLET | Freq: Three times a day (TID) | ORAL | 1 refills | Status: DC

## 2020-08-30 MED ORDER — EZETIMIBE 10 MG PO TABS: 10.0000 mg | ORAL_TABLET | Freq: Every day | ORAL | 1 refills | Status: DC

## 2020-08-30 MED ORDER — CLONAZEPAM 1 MG PO TABS: 1.0000 mg | ORAL_TABLET | Freq: Two times a day (BID) | ORAL | 0 refills | Status: DC | PRN

## 2020-08-30 MED ORDER — BUDESONIDE-FORMOTEROL FUMARATE 80-4.5 MCG/ACT IN AERO: 2.0000 | INHALATION_SPRAY | Freq: Two times a day (BID) | RESPIRATORY_TRACT | 3 refills | Status: DC

## 2020-08-30 MED ORDER — HYDROXYZINE HCL 25 MG PO TABS: 25.0000 mg | ORAL_TABLET | Freq: Three times a day (TID) | ORAL | 1 refills | Status: DC | PRN

## 2020-08-30 MED ORDER — IBUPROFEN 800 MG PO TABS: 800.0000 mg | ORAL_TABLET | Freq: Three times a day (TID) | ORAL | 1 refills | Status: DC | PRN

## 2020-08-30 MED ORDER — VALACYCLOVIR HCL 1 G PO TABS: 1000.0000 mg | ORAL_TABLET | Freq: Every day | ORAL | 0 refills | Status: DC

## 2020-08-30 MED ORDER — ISOSORBIDE MONONITRATE ER 30 MG PO TB24: 30.0000 mg | ORAL_TABLET | Freq: Every day | ORAL | 1 refills | Status: DC

## 2020-08-30 MED ORDER — AMLODIPINE BESYLATE 10 MG PO TABS: 10.0000 mg | ORAL_TABLET | Freq: Every day | ORAL | 1 refills | Status: DC

## 2020-08-30 MED ORDER — FUROSEMIDE 20 MG PO TABS: ORAL_TABLET | ORAL | 0 refills | Status: DC

## 2020-08-30 MED ORDER — ATENOLOL 50 MG PO TABS: 50.0000 mg | ORAL_TABLET | Freq: Every day | ORAL | 1 refills | Status: DC

## 2020-08-30 MED ORDER — FLUOXETINE HCL 40 MG PO CAPS: 80.0000 mg | ORAL_CAPSULE | Freq: Every day | ORAL | 1 refills | Status: DC

## 2020-08-30 MED ORDER — NYSTATIN-TRIAMCINOLONE 100000-0.1 UNIT/GM-% EX OINT
1.0000 | TOPICAL_OINTMENT | Freq: Two times a day (BID) | CUTANEOUS | 1 refills | Status: DC
Start: 2020-08-30 — End: 2024-05-01

## 2020-08-30 MED ORDER — ROSUVASTATIN CALCIUM 5 MG PO TABS: 5.0000 mg | ORAL_TABLET | ORAL | 1 refills | Status: DC

## 2020-08-30 MED ORDER — QUETIAPINE FUMARATE 25 MG PO TABS: 25.0000 mg | ORAL_TABLET | Freq: Every day | ORAL | 1 refills | Status: DC

## 2020-08-30 MED ORDER — LOSARTAN POTASSIUM 100 MG PO TABS: 100.0000 mg | ORAL_TABLET | Freq: Every day | ORAL | 1 refills | Status: DC

## 2020-08-30 MED ORDER — PANTOPRAZOLE SODIUM 40 MG PO TBEC: 40.0000 mg | DELAYED_RELEASE_TABLET | Freq: Two times a day (BID) | ORAL | 1 refills | Status: DC

## 2020-08-30 MED ORDER — TRIAMCINOLONE ACETONIDE 0.5 % EX OINT
1.0000 | TOPICAL_OINTMENT | Freq: Two times a day (BID) | CUTANEOUS | 0 refills | Status: DC
Start: 2020-08-30 — End: 2024-05-01

## 2020-08-30 MED ORDER — NYSTATIN-TRIAMCINOLONE 100000-0.1 UNIT/GM-% EX OINT: 1.0000 "application " | TOPICAL_OINTMENT | Freq: Two times a day (BID) | CUTANEOUS | 1 refills | Status: DC

## 2020-08-30 NOTE — Progress Notes (Signed)
BP (!) 186/107   Pulse 77   Temp 98.8 F (37.1 C)   Ht 5' 4.06" (1.627 m)   Wt 240 lb 8 oz (109.1 kg)   SpO2 98%   BMI 41.21 kg/m    Subjective:    Patient ID: Hannah Kaiser, female    DOB: [birth year], 60 y.o.   MRN: [id number]  HPI: Hannah Kaiser [place] is a 60 y.o. female  Chief Complaint  Patient presents with  . Back Pain    Patient would like an x ray   . Hip Pain  . Medication Refill    All medications  . Depression  . Anxiety    Would like to have busbar increased   BACK PAIN Patient states she has back pain that has been going on for the  Patient states she feels pressure in her bladder.  Patient denies urgency or dysuria.  Patient does endorse urinary frequency. Reports that the pain is on both sides of back and wraps around to the front.  Tenderness when touching.   DEPRESSION/ANXIETY Patient states that she has been out of her medication.  She needs a refill.  It has been worse in the past.  Denies SI. [facility] Office Visit from 08/30/2020 in [place]  PHQ-9 Total Score 12     GAD 7 : Generalized Anxiety Score 08/30/2020 06/15/2019 03/01/2019 02/01/2019  Nervous, Anxious, on Edge 2 0 2 3  Control/stop worrying 0 0 0 3  Worry too much - different things 0 0 0 3  Trouble relaxing 2 1 1 3   Restless 3 0 1 0  Easily annoyed or irritable 1 3 3 3   Afraid - awful might happen 1 0 0 0  Total GAD 7 Score 9 4 7 15   Anxiety Difficulty Somewhat difficult Somewhat difficult Somewhat difficult Somewhat difficult     HYPERTENSION / HYPERLIPIDEMIA Patient has been out of her medications for several months. Patient is not checking blood pressures at home.  She does have chest pain that has been addressed by Cardiology.  It has been worse since she has been out of her Imdur.  SHORTNESS OF BREATH Patient states that she has SOB.  Continues to smoke daily.  Not currently using any inhalers.   Relevant past medical, surgical, family and social history reviewed and  updated as indicated. Interim medical history since our last visit reviewed. Allergies and medications reviewed and updated.  Review of Systems  Respiratory: Positive for shortness of breath.   Cardiovascular: Positive for chest pain.  Psychiatric/Behavioral: Positive for dysphoric mood. Negative for suicidal ideas. The patient is nervous/anxious.     Per HPI unless specifically indicated above     Objective:    BP (!) 186/107   Pulse 77   Temp 98.8 F (37.1 C)   Ht 5' 4.06" (1.627 m)   Wt 240 lb 8 oz (109.1 kg)   SpO2 98%   BMI 41.21 kg/m   Wt Readings from Last 3 Encounters:  08/30/20 240 lb 8 oz (109.1 kg)  06/15/19 239 lb 6 oz (108.6 kg)  03/01/19 234 lb 12.8 oz (106.5 kg)    Physical Exam Vitals and nursing note reviewed.  Constitutional:      General: She is not in acute distress.    Appearance: Normal appearance. She is normal weight. She is not ill-appearing, toxic-appearing or diaphoretic.  HENT:     Head: Normocephalic.     Right Ear: External ear normal.  Left Ear: External ear normal.     Nose: Nose normal.     Mouth/Throat:     Mouth: Mucous membranes are moist.     Pharynx: Oropharynx is clear.  Eyes:     General:        Right eye: No discharge.        Left eye: No discharge.     Extraocular Movements: Extraocular movements intact.     Conjunctiva/sclera: Conjunctivae normal.     Pupils: Pupils are equal, round, and reactive to light.  Cardiovascular:     Rate and Rhythm: Normal rate and regular rhythm.     Heart sounds: No murmur heard.   Pulmonary:     Effort: Pulmonary effort is normal. No respiratory distress.     Breath sounds: Decreased breath sounds present. No wheezing or rales.  Musculoskeletal:     Cervical back: Normal range of motion and neck supple.  Skin:    General: Skin is warm and dry.     Capillary Refill: Capillary refill takes less than 2 seconds.  Neurological:     General: No focal deficit present.     Mental  Status: She is alert and oriented to person, place, and time. Mental status is at baseline.  Psychiatric:        Mood and Affect: Mood normal.        Behavior: Behavior normal.        Thought Content: Thought content normal.        Judgment: Judgment normal.     Results for orders placed or performed in visit on 06/13/20  Novel Coronavirus, NAA ([facility])   Specimen: Nasopharyngeal(NP) swabs in vial transport medium  Result Value Ref Range   SARS-CoV-2, NAA Not Detected Not Detected  SARS-COV-2, NAA 2 DAY TAT  Result Value Ref Range   SARS-CoV-2, NAA 2 DAY TAT Performed       Assessment & Plan:   Problem List Items Addressed This Visit      Cardiovascular and Mediastinum   CAD (coronary artery disease)    40% blockages in 2018- continue medical therapy. Will get her back into see cardiology given her pain. Call with any concerns. Warning signs for which to go to the ER discussed.       Relevant Medications   amLODipine (NORVASC) 10 MG tablet   atenolol (TENORMIN) 50 MG tablet   ezetimibe (ZETIA) 10 MG tablet   furosemide (LASIX) 20 MG tablet   isosorbide mononitrate (IMDUR) 30 MG 24 hr tablet   losartan (COZAAR) 100 MG tablet   rosuvastatin (CRESTOR) 5 MG tablet     Respiratory   RESOLVED: COPD (chronic obstructive pulmonary disease) (HCC)   Relevant Medications   budesonide-formoterol (SYMBICORT) 80-4.5 MCG/ACT inhaler     Genitourinary   Benign hypertensive renal disease    Chronic.  Patient has been out of medication.  Labs ordered today. Will restart medication and follow up in 1 month.        Other   Hyperlipidemia    Chronic.  Has been off of medication due to losing insurance and cost. Has not tolerated statins in the past.  Was previously on Zetia.  Cardiology recommended patient start Crestor 5mg  every other day. Patient never started medication. Refills and labs ordered today.       Relevant Medications   amLODipine (NORVASC) 10 MG tablet   atenolol  (TENORMIN) 50 MG tablet   ezetimibe (ZETIA) 10 MG tablet   furosemide (LASIX) 20  MG tablet   isosorbide mononitrate (IMDUR) 30 MG 24 hr tablet   losartan (COZAAR) 100 MG tablet   rosuvastatin (CRESTOR) 5 MG tablet   Severe episode of recurrent major depressive disorder, without psychotic features ([place]) - Primary    Chronic.  Ongoing.  Patient has been out of medication.  Will restart medication and follow up in 1 month.      Relevant Medications   busPIRone (BUSPAR) 5 MG tablet   FLUoxetine (PROZAC) 40 MG capsule   hydrOXYzine (ATARAX/VISTARIL) 25 MG tablet    Other Visit Diagnoses    Encounter for screening mammogram for malignant neoplasm of breast       Relevant Orders   MM Digital Screening   Urinary frequency       Will obtain UA.  If necessary with send UC and treat with antibiotics.    Relevant Orders   Urinalysis, Routine w reflex microscopic   Acute bilateral low back pain, unspecified whether sciatica present       Will obtain UA.  If negative will order xray to evaluate back pain.    Relevant Medications   ibuprofen (ADVIL) 800 MG tablet       Follow up plan: Return in about 1 month (around 09/30/2020) for BP Check with Dr. Lenna Sciara.

## 2020-08-30 NOTE — Assessment & Plan Note (Signed)
Chronic.  Ongoing.  Patient has been out of medication.  Will restart medication and follow up in 1 month.

## 2020-08-30 NOTE — Assessment & Plan Note (Addendum)
Chronic.  Patient has been out of medication.  Labs ordered today. Will restart medication and follow up in 1 month.

## 2020-08-30 NOTE — Assessment & Plan Note (Signed)
40% blockages in 2018- continue medical therapy. Will get her back into see cardiology given her pain. Call with any concerns. Warning signs for which to go to the ER discussed.

## 2020-08-30 NOTE — Assessment & Plan Note (Signed)
Chronic.  Has been off of medication due to losing insurance and cost. Has not tolerated statins in the past.  Was previously on Zetia.  Cardiology recommended patient start Crestor 5mg  every other day. Patient never started medication. Refills and labs ordered today.

## 2020-08-31 LAB — CBC WITH DIFFERENTIAL/PLATELET
Basophils Absolute: 0 10*3/uL (ref 0.0–0.2)
Basos: 1 %
EOS (ABSOLUTE): 0.2 10*3/uL (ref 0.0–0.4)
Eos: 3 %
Hematocrit: 39.7 % (ref 34.0–46.6)
Hemoglobin: 12.8 g/dL (ref 11.1–15.9)
Immature Grans (Abs): 0 10*3/uL (ref 0.0–0.1)
Immature Granulocytes: 0 %
Lymphocytes Absolute: 1.5 10*3/uL (ref 0.7–3.1)
Lymphs: 27 %
MCH: 27.6 pg (ref 26.6–33.0)
MCHC: 32.2 g/dL (ref 31.5–35.7)
MCV: 86 fL (ref 79–97)
Monocytes Absolute: 0.3 10*3/uL (ref 0.1–0.9)
Monocytes: 6 %
Neutrophils Absolute: 3.6 10*3/uL (ref 1.4–7.0)
Neutrophils: 63 %
Platelets: 217 10*3/uL (ref 150–450)
RBC: 4.63 x10E6/uL (ref 3.77–5.28)
RDW: 14.1 % (ref 11.7–15.4)
WBC: 5.7 10*3/uL (ref 3.4–10.8)

## 2020-08-31 LAB — LIPID PANEL
Chol/HDL Ratio: 4.7 ratio — ABNORMAL HIGH (ref 0.0–4.4)
Cholesterol, Total: 250 mg/dL — ABNORMAL HIGH (ref 100–199)
HDL: 53 mg/dL (ref >39)
LDL Chol Calc (NIH): 179 mg/dL — ABNORMAL HIGH (ref 0–99)
Triglycerides: 100 mg/dL (ref 0–149)
VLDL Cholesterol Cal: 18 mg/dL (ref 5–40)

## 2020-08-31 LAB — COMPREHENSIVE METABOLIC PANEL
ALT: 11 IU/L (ref 0–32)
AST: 15 IU/L (ref 0–40)
Albumin/Globulin Ratio: 2.4 — ABNORMAL HIGH (ref 1.2–2.2)
Albumin: 4.7 g/dL (ref 3.8–4.9)
Alkaline Phosphatase: 73 IU/L (ref 44–121)
BUN/Creatinine Ratio: 22 (ref 9–23)
BUN: 19 mg/dL (ref 6–24)
Bilirubin Total: 0.6 mg/dL (ref 0.0–1.2)
CO2: 18 mmol/L — ABNORMAL LOW (ref 20–29)
Calcium: 9 mg/dL (ref 8.7–10.2)
Chloride: 105 mmol/L (ref 96–106)
Creatinine, Ser: 0.88 mg/dL (ref 0.57–1.00)
Globulin, Total: 2 g/dL (ref 1.5–4.5)
Glucose: 96 mg/dL (ref 65–99)
Potassium: 4.5 mmol/L (ref 3.5–5.2)
Sodium: 139 mmol/L (ref 134–144)
Total Protein: 6.7 g/dL (ref 6.0–8.5)
eGFR: 77 mL/min/{1.73_m2} (ref >59)

## 2020-09-02 NOTE — Progress Notes (Signed)
Please let patient know that overall her lab work looks good.  Her urine does not show a reason for the back pain.  Cholesterol is elevated which is to be expected since she isn't taking her medication right now. Otherwise, complete blood count, kidneys, liver, and electrolytes look good.  Restart medications.  Begin inhaler for breathing and possibly to help with back pain.  Return to clinic as discussed.

## 2020-09-07 ENCOUNTER — Other Ambulatory Visit: Payer: Self-pay

## 2020-09-09 ENCOUNTER — Other Ambulatory Visit: Payer: Self-pay

## 2020-09-09 ENCOUNTER — Telehealth: Payer: Self-pay | Admitting: Nurse Practitioner

## 2020-09-09 DIAGNOSIS — M546 Pain in thoracic spine: Secondary | ICD-10-CM

## 2020-09-09 MED FILL — Quetiapine Fumarate Tab 25 MG: ORAL | 90 days supply | Qty: 90 | Fill #0 | Status: CN

## 2020-09-09 NOTE — Telephone Encounter (Signed)
Xray order placed.

## 2020-09-11 ENCOUNTER — Other Ambulatory Visit: Payer: Self-pay

## 2020-09-11 ENCOUNTER — Ambulatory Visit
Admission: RE | Admit: 2020-09-11 | Discharge: 2020-09-11 | Disposition: A | Discharge status: Home / Self Care | Payer: 59 | Attending: Nurse Practitioner | Admitting: Nurse Practitioner

## 2020-09-11 ENCOUNTER — Other Ambulatory Visit: Payer: Self-pay | Admitting: Nurse Practitioner

## 2020-09-11 ENCOUNTER — Ambulatory Visit
Admission: RE | Admit: 2020-09-11 | Discharge: 2020-09-11 | Disposition: A | Discharge status: Home / Self Care | Payer: 59 | Source: Ambulatory Visit | Attending: Nurse Practitioner | Admitting: Nurse Practitioner

## 2020-09-11 DIAGNOSIS — M546 Pain in thoracic spine: Secondary | ICD-10-CM | POA: Insufficient documentation

## 2020-09-11 DIAGNOSIS — M545 Low back pain, unspecified: Secondary | ICD-10-CM

## 2020-09-13 NOTE — Progress Notes (Signed)
Please let patient know that her thoracic xray showed some degeneration in her discs in her back.    However, her lumbar xray showed significant degeneration in her discs which is worse than prior xray. The join space is narrowing.  Both of these are likely contributing to patient's symptoms.  I recommend patient see Orthopedics for further evaluation and treatment.  I have placed the referral.

## 2020-09-13 NOTE — Addendum Note (Signed)
Addended by: Jon Billings on: 09/13/2020 12:48 PM   Modules accepted: Orders

## 2020-09-20 ENCOUNTER — Other Ambulatory Visit: Payer: Self-pay

## 2020-09-26 ENCOUNTER — Encounter: Payer: Self-pay | Admitting: Family Medicine

## 2020-09-26 ENCOUNTER — Other Ambulatory Visit: Payer: Self-pay

## 2020-09-26 ENCOUNTER — Ambulatory Visit (INDEPENDENT_AMBULATORY_CARE_PROVIDER_SITE_OTHER): Payer: 59 | Admitting: Family Medicine

## 2020-09-26 VITALS — BP 131/78 | HR 57 | Ht 64.17 in | Wt 237.6 lb

## 2020-09-26 DIAGNOSIS — M479 Spondylosis, unspecified: Secondary | ICD-10-CM | POA: Insufficient documentation

## 2020-09-26 DIAGNOSIS — M47896 Other spondylosis, lumbar region: Secondary | ICD-10-CM

## 2020-09-26 DIAGNOSIS — M47894 Other spondylosis, thoracic region: Secondary | ICD-10-CM

## 2020-09-26 DIAGNOSIS — E782 Mixed hyperlipidemia: Secondary | ICD-10-CM

## 2020-09-26 DIAGNOSIS — I129 Hypertensive chronic kidney disease with stage 1 through stage 4 chronic kidney disease, or unspecified chronic kidney disease: Secondary | ICD-10-CM

## 2020-09-26 DIAGNOSIS — F332 Major depressive disorder, recurrent severe without psychotic features: Secondary | ICD-10-CM

## 2020-09-26 MED ORDER — DULOXETINE HCL 60 MG PO CPEP
60.0000 mg | ORAL_CAPSULE | Freq: Every day | ORAL | 3 refills | Status: DC
  Filled 2020-09-26 – 2020-10-21 (×2): qty 30, 30d supply, fill #0

## 2020-09-26 MED ORDER — ALBUTEROL SULFATE HFA 108 (90 BASE) MCG/ACT IN AERS
2.0000 | INHALATION_SPRAY | Freq: Four times a day (QID) | RESPIRATORY_TRACT | 6 refills | Status: DC | PRN
  Filled 2020-09-26 – 2020-10-21 (×2): qty 18, 25d supply, fill #0

## 2020-09-26 NOTE — Assessment & Plan Note (Signed)
Under good control on current regimen. Continue current regimen. Continue to monitor. Call with any concerns. Refills up to date.  Labs to be drawn next visit.

## 2020-09-26 NOTE — Assessment & Plan Note (Signed)
Had MRIs done at [facility] on 09/21/20. Reviewed today. Will start on cymbalta to try to help with the pain. Will get her back into see her neurosurgeon. Call with any concerns.

## 2020-09-26 NOTE — Assessment & Plan Note (Signed)
Doing better on her prozac, but having issues with pain. Will change from prozac to pain and recheck 1 month. Call with any concerns. Continue to monitor.

## 2020-09-26 NOTE — Progress Notes (Signed)
BP 131/78   Pulse (!) 57   Ht 5' 4.17" (1.63 m)   Wt 237 lb 9.6 oz (107.8 kg)   SpO2 98%   BMI 40.56 kg/m    Subjective:    Patient ID: Hannah Kaiser, female    DOB: [birth year], 60 y.o.   MRN: [id number]  HPI: Hannah Kaiser is a 60 y.o. female  Chief Complaint  Patient presents with  . Depression  . Hypertension   HYPERTENSION / [place] Satisfied with current treatment? yes Duration of hypertension: chronic BP monitoring frequency: not checking BP medication side effects: no Duration of hyperlipidemia: chronic Cholesterol medication side effects: no Cholesterol supplements: none Past cholesterol medications: zetia Medication compliance: excellent compliance Aspirin: yes Recent stressors: no Recurrent headaches: no Visual changes: no Palpitations: no Dyspnea: no Chest pain: no Lower extremity edema: no Dizzy/lightheaded: no  DEPRESSION Mood status: stable Satisfied with current treatment?: yes Symptom severity: mild  Duration of current treatment : chronic Side effects: no Medication compliance: excellent compliance Psychotherapy/counseling: no  Previous psychiatric medications: prozac- finds the buspar very helpful Depressed mood: yes Anxious mood: yes Anhedonia: no Significant weight loss or gain: no Insomnia: no  Fatigue: yes Feelings of worthlessness or guilt: no Impaired concentration/indecisiveness: no Suicidal ideations: no Hopelessness: no Crying spells: no Depression screen [facility] 2/9 08/30/2020 06/15/2019 03/01/2019 02/01/2019 01/05/2017  Decreased Interest 0 3 1 3 3   Down, Depressed, Hopeless 0 3 0 1 2  PHQ - 2 Score 0 6 1 4 5   Altered sleeping 2 3 3 3 3   Tired, decreased energy 3 3 3 3 3   Change in appetite 2 3 3 3 3   Feeling bad or failure about yourself  1 0 0 0 2  Trouble concentrating 1 1 0 0 3  Moving slowly or fidgety/restless 3 0 0 2 3  Suicidal thoughts 0 0 0 0 0  PHQ-9 Score 12 16 10 15 22   Difficult doing work/chores Somewhat  difficult Extremely dIfficult Very difficult Somewhat difficult Somewhat difficult  Some recent data might be hidden    Relevant past medical, surgical, family and social history reviewed and updated as indicated. Interim medical history since our last visit reviewed. Allergies and medications reviewed and updated.  Review of Systems  Constitutional: Negative.   Respiratory: Negative.   Cardiovascular: Negative.   Gastrointestinal: Negative.   Musculoskeletal: Positive for arthralgias, back pain, myalgias, neck pain and neck stiffness. Negative for gait problem and joint swelling.  Skin: Negative.   Neurological: Positive for numbness. Negative for dizziness, tremors, seizures, syncope, facial asymmetry, speech difficulty, weakness, light-headedness and headaches.  Psychiatric/Behavioral: Positive for dysphoric mood. Negative for agitation, behavioral problems, confusion, decreased concentration, hallucinations, self-injury, sleep disturbance and suicidal ideas. The patient is nervous/anxious. The patient is not hyperactive.     Per HPI unless specifically indicated above     Objective:    BP 131/78   Pulse (!) 57   Ht 5' 4.17" (1.63 m)   Wt 237 lb 9.6 oz (107.8 kg)   SpO2 98%   BMI 40.56 kg/m   Wt Readings from Last 3 Encounters:  09/26/20 237 lb 9.6 oz (107.8 kg)  08/30/20 240 lb 8 oz (109.1 kg)  06/15/19 239 lb 6 oz (108.6 kg)    Physical Exam Vitals and nursing note reviewed.  Constitutional:      General: She is not in acute distress.    Appearance: Normal appearance. She is not ill-appearing, toxic-appearing or diaphoretic.  HENT:  Head: Normocephalic and atraumatic.     Right Ear: External ear normal.     Left Ear: External ear normal.     Nose: Nose normal.     Mouth/Throat:     Mouth: Mucous membranes are moist.     Pharynx: Oropharynx is clear.  Eyes:     General: No scleral icterus.       Right eye: No discharge.        Left eye: No discharge.      Extraocular Movements: Extraocular movements intact.     Conjunctiva/sclera: Conjunctivae normal.     Pupils: Pupils are equal, round, and reactive to light.  Cardiovascular:     Rate and Rhythm: Normal rate and regular rhythm.     Pulses: Normal pulses.     Heart sounds: Normal heart sounds. No murmur heard. No friction rub. No gallop.   Pulmonary:     Effort: Pulmonary effort is normal. No respiratory distress.     Breath sounds: No stridor. Wheezing present. No rhonchi or rales.  Chest:     Chest wall: No tenderness.  Musculoskeletal:        General: Normal range of motion.     Cervical back: Normal range of motion and neck supple.  Skin:    General: Skin is warm and dry.     Capillary Refill: Capillary refill takes less than 2 seconds.     Coloration: Skin is not jaundiced or pale.     Findings: No bruising, erythema, lesion or rash.  Neurological:     General: No focal deficit present.     Mental Status: She is alert and oriented to person, place, and time. Mental status is at baseline.  Psychiatric:        Mood and Affect: Mood normal.        Behavior: Behavior normal.        Thought Content: Thought content normal.        Judgment: Judgment normal.     Results for orders placed or performed in visit on 08/30/20  Microscopic Examination   Urine  Result Value Ref Range   WBC, UA None seen 0 - 5 /hpf   RBC 0-2 0 - 2 /hpf   Epithelial Cells (non renal) 0-10 0 - 10 /hpf   Bacteria, UA None seen None seen/Few  Lipid Profile  Result Value Ref Range   Cholesterol, Total 250 (H) 100 - 199 mg/dL   Triglycerides 100 0 - 149 mg/dL   HDL 53 >39 mg/dL   VLDL Cholesterol Cal 18 5 - 40 mg/dL   LDL Chol Calc (NIH) 179 (H) 0 - 99 mg/dL   Chol/HDL Ratio 4.7 (H) 0.0 - 4.4 ratio  Comp Met (CMET)  Result Value Ref Range   Glucose 96 65 - 99 mg/dL   BUN 19 6 - 24 mg/dL   Creatinine, Ser 0.88 0.57 - 1.00 mg/dL   eGFR 77 >59 mL/min/1.73   BUN/Creatinine Ratio 22 9 - 23   Sodium  139 134 - 144 mmol/L   Potassium 4.5 3.5 - 5.2 mmol/L   Chloride 105 96 - 106 mmol/L   CO2 18 (L) 20 - 29 mmol/L   Calcium 9.0 8.7 - 10.2 mg/dL   Total Protein 6.7 6.0 - 8.5 g/dL   Albumin 4.7 3.8 - 4.9 g/dL   Globulin, Total 2.0 1.5 - 4.5 g/dL   Albumin/Globulin Ratio 2.4 (H) 1.2 - 2.2   Bilirubin Total 0.6 0.0 - 1.2 mg/dL  Alkaline Phosphatase 73 44 - 121 IU/L   AST 15 0 - 40 IU/L   ALT 11 0 - 32 IU/L  CBC w/Diff  Result Value Ref Range   WBC 5.7 3.4 - 10.8 x10E3/uL   RBC 4.63 3.77 - 5.28 x10E6/uL   Hemoglobin 12.8 11.1 - 15.9 g/dL   Hematocrit 39.7 34.0 - 46.6 %   MCV 86 79 - 97 fL   MCH 27.6 26.6 - 33.0 pg   MCHC 32.2 31.5 - 35.7 g/dL   RDW 14.1 11.7 - 15.4 %   Platelets 217 150 - 450 x10E3/uL   Neutrophils 63 Not Estab. %   Lymphs 27 Not Estab. %   Monocytes 6 Not Estab. %   Eos 3 Not Estab. %   Basos 1 Not Estab. %   Neutrophils Absolute 3.6 1.4 - 7.0 x10E3/uL   Lymphocytes Absolute 1.5 0.7 - 3.1 x10E3/uL   Monocytes Absolute 0.3 0.1 - 0.9 x10E3/uL   EOS (ABSOLUTE) 0.2 0.0 - 0.4 x10E3/uL   Basophils Absolute 0.0 0.0 - 0.2 x10E3/uL   Immature Granulocytes 0 Not Estab. %   Immature Grans (Abs) 0.0 0.0 - 0.1 x10E3/uL  Urinalysis, Routine w reflex microscopic  Result Value Ref Range   Specific Gravity, UA 1.020 1.005 - 1.030   pH, UA 6.0 5.0 - 7.5   Color, UA Yellow Yellow   Appearance Ur Clear Clear   Leukocytes,UA Negative Negative   Protein,UA Negative Negative/Trace   Glucose, UA Negative Negative   Ketones, UA Negative Negative   RBC, UA Trace (A) Negative   Bilirubin, UA Negative Negative   Urobilinogen, Ur 0.2 0.2 - 1.0 mg/dL   Nitrite, UA Negative Negative   Microscopic Examination See below:       Assessment & Plan:   Problem List Items Addressed This Visit      Musculoskeletal and Integument   Spinal osteoarthritis    Had MRIs done at [facility] on 09/21/20. Reviewed today. Will start on cymbalta to try to help with the pain. Will get her back into see  her neurosurgeon. Call with any concerns.         Genitourinary   Benign hypertensive renal disease    Under good control on current regimen. Continue current regimen. Continue to monitor. Call with any concerns. Refills up to date.  Labs to be drawn next visit.          Other   Hyperlipidemia    Under good control on current regimen. Continue current regimen. Continue to monitor. Call with any concerns. Refills up to date.  Labs to be drawn next visit.       Severe episode of recurrent major depressive disorder, without psychotic features ([place]) - Primary    Doing better on her prozac, but having issues with pain. Will change from prozac to pain and recheck 1 month. Call with any concerns. Continue to monitor.           Follow up plan: Return in about 4 weeks (around 10/24/2020).

## 2020-10-10 ENCOUNTER — Other Ambulatory Visit: Payer: Self-pay

## 2020-10-21 ENCOUNTER — Other Ambulatory Visit: Payer: Self-pay

## 2020-10-31 ENCOUNTER — Ambulatory Visit
Admission: RE | Admit: 2020-10-31 | Discharge: 2020-10-31 | Disposition: A | Discharge status: Home / Self Care | Payer: 59 | Source: Home / Self Care | Attending: Family Medicine | Admitting: Family Medicine

## 2020-10-31 ENCOUNTER — Ambulatory Visit (INDEPENDENT_AMBULATORY_CARE_PROVIDER_SITE_OTHER): Payer: 59 | Admitting: Family Medicine

## 2020-10-31 ENCOUNTER — Ambulatory Visit
Admission: RE | Admit: 2020-10-31 | Discharge: 2020-10-31 | Disposition: A | Discharge status: Home / Self Care | Payer: 59 | Source: Ambulatory Visit | Attending: Family Medicine | Admitting: Family Medicine

## 2020-10-31 ENCOUNTER — Other Ambulatory Visit: Payer: Self-pay

## 2020-10-31 ENCOUNTER — Encounter: Payer: Self-pay | Admitting: Family Medicine

## 2020-10-31 VITALS — BP 128/80 | HR 61 | Temp 98.2°F | Wt 237.0 lb

## 2020-10-31 DIAGNOSIS — M25572 Pain in left ankle and joints of left foot: Secondary | ICD-10-CM | POA: Diagnosis present

## 2020-10-31 DIAGNOSIS — F332 Major depressive disorder, recurrent severe without psychotic features: Secondary | ICD-10-CM | POA: Diagnosis not present

## 2020-10-31 DIAGNOSIS — I129 Hypertensive chronic kidney disease with stage 1 through stage 4 chronic kidney disease, or unspecified chronic kidney disease: Secondary | ICD-10-CM

## 2020-10-31 MED ORDER — NAPROXEN 500 MG PO TABS
500.0000 mg | ORAL_TABLET | Freq: Two times a day (BID) | ORAL | 0 refills | Status: DC
  Filled 2020-10-31: qty 30, 15d supply, fill #0

## 2020-10-31 MED ORDER — PREDNISONE 50 MG PO TABS
50.0000 mg | ORAL_TABLET | Freq: Every day | ORAL | 0 refills | Status: DC
  Filled 2020-10-31: qty 5, 5d supply, fill #0

## 2020-10-31 NOTE — Assessment & Plan Note (Signed)
Encouraged her to start cymbalta. Will recheck in 1 month. Call with any concerns.

## 2020-10-31 NOTE — Assessment & Plan Note (Signed)
Under good control. Will check her labs today back on medicine. Refills up to date. Continue to monitor.

## 2020-10-31 NOTE — Progress Notes (Signed)
BP 128/80   Pulse 61   Temp 98.2 F (36.8 C)   Wt 237 lb (107.5 kg)   SpO2 97%   BMI 40.46 kg/m    Subjective:    Patient ID: Hannah Kaiser, female    DOB: 1961-01-24, 60 y.o.   MRN: [id number]  HPI: [name] is a 60 y.o. female  Chief Complaint  Patient presents with  . Pain    Patient states about 3 weeks ago her left hip and left ankle have been hurting. Pain is constantly there.    FOOT PAIN Duration: 3 weeks Involved foot: left Mechanism of injury: unknown Location: ankle joint Onset: sudden  Severity: severe  Quality:  Sharp, crushing Frequency: constant Radiation: also pain in her hip Aggravating factors: weight bearing, walking, running, stairs, bending and movement  Alleviating factors:nothing   Status: worse Treatments attempted: rest  Relief with NSAIDs?:  mild Weakness with weight bearing or walking: yes Morning stiffness: no Swelling: yes Redness: no Bruising: no Paresthesias / decreased sensation: no  Fevers:no  Did not get her cymbalta- still taking her prozac. Has not seen neurosurgery yet.   Relevant past medical, surgical, family and social history reviewed and updated as indicated. Interim medical history since our last visit reviewed. Allergies and medications reviewed and updated.  Review of Systems  Constitutional: Negative.   Respiratory: Negative.   Cardiovascular: Negative.   Gastrointestinal: Negative.   Musculoskeletal: Positive for arthralgias, gait problem and joint swelling. Negative for back pain, myalgias, neck pain and neck stiffness.  Skin: Negative.   Psychiatric/Behavioral: Negative.     Per HPI unless specifically indicated above     Objective:    BP 128/80   Pulse 61   Temp 98.2 F (36.8 C)   Wt 237 lb (107.5 kg)   SpO2 97%   BMI 40.46 kg/m   Wt Readings from Last 3 Encounters:  10/31/20 237 lb (107.5 kg)  09/26/20 237 lb 9.6 oz (107.8 kg)  08/30/20 240 lb 8 oz (109.1 kg)    Physical Exam Vitals  and nursing note reviewed.  Constitutional:      General: She is not in acute distress.    Appearance: Normal appearance. She is not ill-appearing, toxic-appearing or diaphoretic.  HENT:     Head: Normocephalic and atraumatic.     Right Ear: External ear normal.     Left Ear: External ear normal.     Nose: Nose normal.     Mouth/Throat:     Mouth: Mucous membranes are moist.     Pharynx: Oropharynx is clear.  Eyes:     General: No scleral icterus.       Right eye: No discharge.        Left eye: No discharge.     Extraocular Movements: Extraocular movements intact.     Conjunctiva/sclera: Conjunctivae normal.     Pupils: Pupils are equal, round, and reactive to light.  Cardiovascular:     Rate and Rhythm: Normal rate and regular rhythm.     Pulses: Normal pulses.     Heart sounds: Normal heart sounds. No murmur heard. No friction rub. No gallop.   Pulmonary:     Effort: Pulmonary effort is normal. No respiratory distress.     Breath sounds: Normal breath sounds. No stridor. No wheezing, rhonchi or rales.  Chest:     Chest wall: No tenderness.  Musculoskeletal:        General: Swelling (L ankle) and tenderness (L ankle)  present.     Cervical back: Normal range of motion and neck supple.  Skin:    General: Skin is warm and dry.     Capillary Refill: Capillary refill takes less than 2 seconds.     Coloration: Skin is not jaundiced or pale.     Findings: No bruising, erythema, lesion or rash.  Neurological:     General: No focal deficit present.     Mental Status: She is alert and oriented to person, place, and time. Mental status is at baseline.  Psychiatric:        Mood and Affect: Mood normal.        Behavior: Behavior normal.        Thought Content: Thought content normal.        Judgment: Judgment normal.     Results for orders placed or performed in visit on 08/30/20  Microscopic Examination   Urine  Result Value Ref Range   WBC, UA None seen 0 - 5 /hpf   RBC 0-2  0 - 2 /hpf   Epithelial Cells (non renal) 0-10 0 - 10 /hpf   Bacteria, UA None seen None seen/Few  Lipid Profile  Result Value Ref Range   Cholesterol, Total 250 (H) 100 - 199 mg/dL   Triglycerides 100 0 - 149 mg/dL   HDL 53 >39 mg/dL   VLDL Cholesterol Cal 18 5 - 40 mg/dL   LDL Chol Calc (NIH) 179 (H) 0 - 99 mg/dL   Chol/HDL Ratio 4.7 (H) 0.0 - 4.4 ratio  Comp Met (CMET)  Result Value Ref Range   Glucose 96 65 - 99 mg/dL   BUN 19 6 - 24 mg/dL   Creatinine, Ser 0.88 0.57 - 1.00 mg/dL   eGFR 77 >59 mL/min/1.73   BUN/Creatinine Ratio 22 9 - 23   Sodium 139 134 - 144 mmol/L   Potassium 4.5 3.5 - 5.2 mmol/L   Chloride 105 96 - 106 mmol/L   CO2 18 (L) 20 - 29 mmol/L   Calcium 9.0 8.7 - 10.2 mg/dL   Total Protein 6.7 6.0 - 8.5 g/dL   Albumin 4.7 3.8 - 4.9 g/dL   Globulin, Total 2.0 1.5 - 4.5 g/dL   Albumin/Globulin Ratio 2.4 (H) 1.2 - 2.2   Bilirubin Total 0.6 0.0 - 1.2 mg/dL   Alkaline Phosphatase 73 44 - 121 IU/L   AST 15 0 - 40 IU/L   ALT 11 0 - 32 IU/L  CBC w/Diff  Result Value Ref Range   WBC 5.7 3.4 - 10.8 x10E3/uL   RBC 4.63 3.77 - 5.28 x10E6/uL   Hemoglobin 12.8 11.1 - 15.9 g/dL   Hematocrit 39.7 34.0 - 46.6 %   MCV 86 79 - 97 fL   MCH 27.6 26.6 - 33.0 pg   MCHC 32.2 31.5 - 35.7 g/dL   RDW 14.1 11.7 - 15.4 %   Platelets 217 150 - 450 x10E3/uL   Neutrophils 63 Not Estab. %   Lymphs 27 Not Estab. %   Monocytes 6 Not Estab. %   Eos 3 Not Estab. %   Basos 1 Not Estab. %   Neutrophils Absolute 3.6 1.4 - 7.0 x10E3/uL   Lymphocytes Absolute 1.5 0.7 - 3.1 x10E3/uL   Monocytes Absolute 0.3 0.1 - 0.9 x10E3/uL   EOS (ABSOLUTE) 0.2 0.0 - 0.4 x10E3/uL   Basophils Absolute 0.0 0.0 - 0.2 x10E3/uL   Immature Granulocytes 0 Not Estab. %   Immature Grans (Abs) 0.0 0.0 - 0.1  x10E3/uL  Urinalysis, Routine w reflex microscopic  Result Value Ref Range   Specific Gravity, UA 1.020 1.005 - 1.030   pH, UA 6.0 5.0 - 7.5   Color, UA Yellow Yellow   Appearance Ur Clear Clear    Leukocytes,UA Negative Negative   Protein,UA Negative Negative/Trace   Glucose, UA Negative Negative   Ketones, UA Negative Negative   RBC, UA Trace (A) Negative   Bilirubin, UA Negative Negative   Urobilinogen, Ur 0.2 0.2 - 1.0 mg/dL   Nitrite, UA Negative Negative   Microscopic Examination See below:       Assessment & Plan:   Problem List Items Addressed This Visit      Genitourinary   Benign hypertensive renal disease    Under good control. Will check her labs today back on medicine. Refills up to date. Continue to monitor.       Relevant Orders   Comprehensive metabolic panel     Other   Severe episode of recurrent major depressive disorder, without psychotic features ([place])    Encouraged her to start cymbalta. Will recheck in 1 month. Call with any concerns.        Other Visit Diagnoses    Acute left ankle pain    -  Primary   Will check labs for gout. Check x-ray. Start prednisone, then naproxen. Referral to podiatry placed.   Relevant Orders   Comprehensive metabolic panel   Uric acid   DG Ankle Complete Left   Ambulatory referral to Podiatry       Follow up plan: Return in about 4 weeks (around 11/28/2020).

## 2020-11-01 ENCOUNTER — Other Ambulatory Visit: Payer: Self-pay

## 2020-11-01 LAB — COMPREHENSIVE METABOLIC PANEL
ALT: 10 IU/L (ref 0–32)
AST: 13 IU/L (ref 0–40)
Albumin/Globulin Ratio: 2.9 — ABNORMAL HIGH (ref 1.2–2.2)
Albumin: 4.7 g/dL (ref 3.8–4.9)
Alkaline Phosphatase: 69 IU/L (ref 44–121)
BUN/Creatinine Ratio: 14 (ref 9–23)
BUN: 11 mg/dL (ref 6–24)
Bilirubin Total: 0.6 mg/dL (ref 0.0–1.2)
CO2: 19 mmol/L — ABNORMAL LOW (ref 20–29)
Calcium: 9.2 mg/dL (ref 8.7–10.2)
Chloride: 105 mmol/L (ref 96–106)
Creatinine, Ser: 0.8 mg/dL (ref 0.57–1.00)
Globulin, Total: 1.6 g/dL (ref 1.5–4.5)
Glucose: 93 mg/dL (ref 65–99)
Potassium: 4.2 mmol/L (ref 3.5–5.2)
Sodium: 140 mmol/L (ref 134–144)
Total Protein: 6.3 g/dL (ref 6.0–8.5)
eGFR: 85 mL/min/{1.73_m2} (ref >59)

## 2020-11-01 LAB — URIC ACID: Uric Acid: 3.9 mg/dL (ref 3.0–7.2)

## 2020-12-12 ENCOUNTER — Ambulatory Visit: Payer: Self-pay | Admitting: Podiatry

## 2020-12-17 ENCOUNTER — Ambulatory Visit: Payer: Self-pay | Admitting: Podiatry

## 2021-01-13 ENCOUNTER — Other Ambulatory Visit: Payer: Self-pay

## 2021-01-13 ENCOUNTER — Telehealth (INDEPENDENT_AMBULATORY_CARE_PROVIDER_SITE_OTHER): Payer: 59 | Admitting: Family Medicine

## 2021-01-13 ENCOUNTER — Other Ambulatory Visit: Payer: Self-pay | Admitting: Nurse Practitioner

## 2021-01-13 ENCOUNTER — Encounter: Payer: Self-pay | Admitting: Family Medicine

## 2021-01-13 DIAGNOSIS — U071 COVID-19: Secondary | ICD-10-CM

## 2021-01-13 MED ORDER — MOLNUPIRAVIR EUA 200MG CAPSULE
4.0000 | ORAL_CAPSULE | Freq: Two times a day (BID) | ORAL | 0 refills | Status: AC
  Filled 2021-01-13: qty 40, 5d supply, fill #0

## 2021-01-13 MED ORDER — PREDNISONE 10 MG PO TABS
ORAL_TABLET | ORAL | 0 refills | Status: DC
  Filled 2021-01-13: qty 42, 12d supply, fill #0

## 2021-01-13 MED ORDER — FUROSEMIDE 20 MG PO TABS
ORAL_TABLET | ORAL | 0 refills | Status: DC
  Filled 2021-01-13: qty 60, 60d supply, fill #0

## 2021-01-13 MED ORDER — ROSUVASTATIN CALCIUM 5 MG PO TABS
ORAL_TABLET | ORAL | 1 refills | Status: DC
  Filled 2021-01-13: qty 45, 90d supply, fill #0

## 2021-01-13 MED ORDER — AMLODIPINE BESYLATE 10 MG PO TABS
10.0000 mg | ORAL_TABLET | Freq: Every day | ORAL | 1 refills | Status: DC
  Filled 2021-01-13: qty 90, 90d supply, fill #0

## 2021-01-13 MED ORDER — BENZONATATE 100 MG PO CAPS
200.0000 mg | ORAL_CAPSULE | Freq: Two times a day (BID) | ORAL | 0 refills | Status: DC | PRN
  Filled 2021-01-13: qty 40, 10d supply, fill #0

## 2021-01-13 MED ORDER — ATENOLOL 50 MG PO TABS
ORAL_TABLET | Freq: Every day | ORAL | 1 refills | Status: DC
  Filled 2021-01-13: qty 90, 90d supply, fill #0

## 2021-01-13 MED ORDER — HYDROCOD POLST-CPM POLST ER 10-8 MG/5ML PO SUER
5.0000 mL | Freq: Two times a day (BID) | ORAL | 0 refills | Status: DC | PRN
  Filled 2021-01-13: qty 50, 5d supply, fill #0

## 2021-01-13 NOTE — Progress Notes (Signed)
There were no vitals taken for this visit.   Subjective:    Patient ID: Hannah Kaiser, female    DOB: 1961/03/12, 60 y.o.   MRN: [id number]  HPI: Hannah Kaiser is a 60 y.o. female  Chief Complaint  Patient presents with   Covid Positive    Pt states she tested positive for Covid this morning. States she has a headache, cough, body aches, SOB with exertion. States symptoms started Saturday   UPPER RESPIRATORY TRACT INFECTION Duration: 2 days Worst symptom: cough Fever: no Cough: yes Shortness of breath: yes Wheezing: yes Chest pain: yes Chest tightness: yes Chest congestion: yes Nasal congestion: yes Runny nose: yes Post nasal drip: no Sneezing: no Sore throat: no Swollen glands: no Sinus pressure: yes Headache: no Face pain: no Toothache: yes Ear pain: no  Ear pressure: yes "right Eyes red/itching:no Eye drainage/crusting: no  Vomiting: no Rash: no Fatigue: yes Sick contacts: yes Strep contacts: no  Context: worse Recurrent sinusitis: no Relief with OTC cold/cough medications: no  Treatments attempted: cold/sinus and mucinex    Relevant past medical, surgical, family and social history reviewed and updated as indicated. Interim medical history since our last visit reviewed. Allergies and medications reviewed and updated.  Review of Systems  Constitutional:  Positive for chills, diaphoresis and fatigue. Negative for activity change, appetite change, fever and unexpected weight change.  HENT:  Positive for congestion, ear pain, sinus pressure and sore throat. Negative for dental problem, drooling, ear discharge, facial swelling, hearing loss, mouth sores, nosebleeds, postnasal drip, rhinorrhea, sinus pain, sneezing, tinnitus, trouble swallowing and voice change.   Eyes: Negative.   Respiratory:  Positive for cough, chest tightness, shortness of breath and wheezing. Negative for apnea, choking and stridor.   Cardiovascular: Negative.   Gastrointestinal: Negative.    Musculoskeletal: Negative.   Neurological: Negative.   Psychiatric/Behavioral: Negative.     Per HPI unless specifically indicated above     Objective:    There were no vitals taken for this visit.  Wt Readings from Last 3 Encounters:  10/31/20 237 lb (107.5 kg)  09/26/20 237 lb 9.6 oz (107.8 kg)  08/30/20 240 lb 8 oz (109.1 kg)    Physical Exam Vitals and nursing note reviewed.  Constitutional:      General: She is not in acute distress.    Appearance: Normal appearance. She is not ill-appearing, toxic-appearing or diaphoretic.  HENT:     Head: Normocephalic and atraumatic.     Right Ear: External ear normal.     Left Ear: External ear normal.     Nose: Nose normal.     Mouth/Throat:     Mouth: Mucous membranes are moist.     Pharynx: Oropharynx is clear.  Eyes:     General: No scleral icterus.       Right eye: No discharge.        Left eye: No discharge.     Conjunctiva/sclera: Conjunctivae normal.     Pupils: Pupils are equal, round, and reactive to light.  Pulmonary:     Effort: Pulmonary effort is normal. No respiratory distress.     Comments: Speaking in full sentences Musculoskeletal:        General: Normal range of motion.     Cervical back: Normal range of motion.  Skin:    Coloration: Skin is not jaundiced or pale.     Findings: No bruising, erythema, lesion or rash.  Neurological:     Mental Status: She is  alert and oriented to person, place, and time. Mental status is at baseline.  Psychiatric:        Mood and Affect: Mood normal.        Behavior: Behavior normal.        Thought Content: Thought content normal.        Judgment: Judgment normal.    Results for orders placed or performed in visit on 10/31/20  Comprehensive metabolic panel  Result Value Ref Range   Glucose 93 65 - 99 mg/dL   BUN 11 6 - 24 mg/dL   Creatinine, Ser 0.80 0.57 - 1.00 mg/dL   eGFR 85 >59 mL/min/1.73   BUN/Creatinine Ratio 14 9 - 23   Sodium 140 134 - 144 mmol/L    Potassium 4.2 3.5 - 5.2 mmol/L   Chloride 105 96 - 106 mmol/L   CO2 19 (L) 20 - 29 mmol/L   Calcium 9.2 8.7 - 10.2 mg/dL   Total Protein 6.3 6.0 - 8.5 g/dL   Albumin 4.7 3.8 - 4.9 g/dL   Globulin, Total 1.6 1.5 - 4.5 g/dL   Albumin/Globulin Ratio 2.9 (H) 1.2 - 2.2   Bilirubin Total 0.6 0.0 - 1.2 mg/dL   Alkaline Phosphatase 69 44 - 121 IU/L   AST 13 0 - 40 IU/L   ALT 10 0 - 32 IU/L  Uric acid  Result Value Ref Range   Uric Acid 3.9 3.0 - 7.2 mg/dL      Assessment & Plan:   Problem List Items Addressed This Visit   None Visit Diagnoses     COVID-19    -  Primary   Will treat with mulnopirovir, prednisone, tessalon and tussionex. Call if not getting better or getting wose. Continue to monitor.    Relevant Medications   molnupiravir EUA 200 mg CAPS        Follow up plan: Return if symptoms worsen or fail to improve.   This visit was completed via video visit through MyChart due to the restrictions of the COVID-19 pandemic. All issues as above were discussed and addressed. Physical exam was done as above through visual confirmation on video through MyChart. If it was felt that the patient should be evaluated in the office, they were directed there. The patient verbally consented to this visit. Location of the patient: home Location of the provider: work Those involved with this call:  Provider: Park Liter, DO CMA: Yvonna Alanis, Sandy Desk/Registration: Roe Rutherford  Time spent on call:  15 minutes with patient face to face via video conference. More than 50% of this time was spent in counseling and coordination of care. 30 minutes total spent in review of patient's record and preparation of their chart.

## 2021-01-15 ENCOUNTER — Telehealth: Payer: Self-pay | Admitting: Nurse Practitioner

## 2021-01-15 ENCOUNTER — Other Ambulatory Visit: Payer: Self-pay

## 2021-01-15 MED ORDER — LIDOCAINE VISCOUS HCL 2 % MT SOLN
5.0000 mL | Freq: Three times a day (TID) | OROMUCOSAL | 0 refills | Status: DC
  Filled 2021-01-15: qty 360, 24d supply, fill #0

## 2021-01-15 NOTE — Telephone Encounter (Signed)
Medication taken to the pharmacy.

## 2021-01-22 ENCOUNTER — Other Ambulatory Visit: Payer: Self-pay | Admitting: Family Medicine

## 2021-01-22 DIAGNOSIS — Z1231 Encounter for screening mammogram for malignant neoplasm of breast: Secondary | ICD-10-CM

## 2021-02-05 ENCOUNTER — Other Ambulatory Visit: Payer: Self-pay

## 2021-02-06 NOTE — Telephone Encounter (Signed)
Over due for follow up

## 2021-02-11 ENCOUNTER — Ambulatory Visit
Admission: RE | Admit: 2021-02-11 | Discharge: 2021-02-11 | Disposition: A | Discharge status: Home / Self Care | Payer: 59 | Source: Ambulatory Visit | Attending: Family Medicine | Admitting: Family Medicine

## 2021-02-11 ENCOUNTER — Other Ambulatory Visit: Payer: Self-pay

## 2021-02-11 DIAGNOSIS — Z1231 Encounter for screening mammogram for malignant neoplasm of breast: Secondary | ICD-10-CM | POA: Diagnosis not present

## 2021-02-18 NOTE — Telephone Encounter (Signed)
Pt is scheduled for follow up on 03/03/2021

## 2021-03-03 ENCOUNTER — Other Ambulatory Visit: Payer: Self-pay

## 2021-03-03 ENCOUNTER — Ambulatory Visit (INDEPENDENT_AMBULATORY_CARE_PROVIDER_SITE_OTHER): Payer: 59 | Admitting: Family Medicine

## 2021-03-03 ENCOUNTER — Encounter: Payer: Self-pay | Admitting: Family Medicine

## 2021-03-03 VITALS — BP 148/86 | HR 65 | Ht 64.0 in | Wt 238.0 lb

## 2021-03-03 DIAGNOSIS — E782 Mixed hyperlipidemia: Secondary | ICD-10-CM

## 2021-03-03 DIAGNOSIS — Z9049 Acquired absence of other specified parts of digestive tract: Secondary | ICD-10-CM

## 2021-03-03 DIAGNOSIS — J449 Chronic obstructive pulmonary disease, unspecified: Secondary | ICD-10-CM

## 2021-03-03 DIAGNOSIS — Z23 Encounter for immunization: Secondary | ICD-10-CM | POA: Diagnosis not present

## 2021-03-03 DIAGNOSIS — I129 Hypertensive chronic kidney disease with stage 1 through stage 4 chronic kidney disease, or unspecified chronic kidney disease: Secondary | ICD-10-CM | POA: Diagnosis not present

## 2021-03-03 DIAGNOSIS — F332 Major depressive disorder, recurrent severe without psychotic features: Secondary | ICD-10-CM

## 2021-03-03 DIAGNOSIS — R911 Solitary pulmonary nodule: Secondary | ICD-10-CM

## 2021-03-03 DIAGNOSIS — IMO0001 Reserved for inherently not codable concepts without codable children: Secondary | ICD-10-CM

## 2021-03-03 DIAGNOSIS — M7989 Other specified soft tissue disorders: Secondary | ICD-10-CM

## 2021-03-03 DIAGNOSIS — K219 Gastro-esophageal reflux disease without esophagitis: Secondary | ICD-10-CM

## 2021-03-03 LAB — URINALYSIS, ROUTINE W REFLEX MICROSCOPIC
Bilirubin, UA: NEGATIVE
Glucose, UA: NEGATIVE
Ketones, UA: NEGATIVE
Leukocytes,UA: NEGATIVE
Nitrite, UA: NEGATIVE
Protein,UA: NEGATIVE
Specific Gravity, UA: 1.01 (ref 1.005–1.030)
Urobilinogen, Ur: 0.2 mg/dL (ref 0.2–1.0)
pH, UA: 6.5 (ref 5.0–7.5)

## 2021-03-03 LAB — MICROSCOPIC EXAMINATION: WBC, UA: NONE SEEN /hpf (ref 0–5)

## 2021-03-03 LAB — MICROALBUMIN, URINE WAIVED
Creatinine, Urine Waived: 100 mg/dL (ref 10–300)
Microalb, Ur Waived: 10 mg/L (ref 0–19)
Microalb/Creat Ratio: <30 mg/g (ref <30)

## 2021-03-03 LAB — BAYER DCA HB A1C WAIVED: HB A1C (BAYER DCA - WAIVED): 5.4 % (ref 4.8–5.6)

## 2021-03-03 MED ORDER — VALACYCLOVIR HCL 1 G PO TABS: 1000.0000 mg | ORAL_TABLET | Freq: Every day | ORAL | 0 refills | Status: DC

## 2021-03-03 MED ORDER — PANTOPRAZOLE SODIUM 40 MG PO TBEC: 40.0000 mg | DELAYED_RELEASE_TABLET | Freq: Two times a day (BID) | ORAL | 1 refills | Status: DC

## 2021-03-03 MED ORDER — HYDROXYZINE HCL 25 MG PO TABS: 25.0000 mg | ORAL_TABLET | Freq: Three times a day (TID) | ORAL | 1 refills | Status: DC | PRN

## 2021-03-03 MED ORDER — BUDESONIDE-FORMOTEROL FUMARATE 80-4.5 MCG/ACT IN AERO: 2.0000 | INHALATION_SPRAY | Freq: Two times a day (BID) | RESPIRATORY_TRACT | 3 refills | Status: DC

## 2021-03-03 MED ORDER — EZETIMIBE 10 MG PO TABS: 10.0000 mg | ORAL_TABLET | Freq: Every day | ORAL | 1 refills | Status: DC

## 2021-03-03 MED ORDER — AMLODIPINE BESYLATE 10 MG PO TABS: 10.0000 mg | ORAL_TABLET | Freq: Every day | ORAL | 1 refills | Status: DC

## 2021-03-03 MED ORDER — DULOXETINE HCL 60 MG PO CPEP: 60.0000 mg | ORAL_CAPSULE | Freq: Every day | ORAL | 3 refills | Status: DC

## 2021-03-03 MED ORDER — ALBUTEROL SULFATE HFA 108 (90 BASE) MCG/ACT IN AERS: 2.0000 | INHALATION_SPRAY | Freq: Four times a day (QID) | RESPIRATORY_TRACT | 6 refills | Status: DC | PRN

## 2021-03-03 MED ORDER — CLONAZEPAM 1 MG PO TABS: 1.0000 mg | ORAL_TABLET | Freq: Two times a day (BID) | ORAL | 0 refills | Status: DC | PRN

## 2021-03-03 MED ORDER — LOSARTAN POTASSIUM 100 MG PO TABS: 100.0000 mg | ORAL_TABLET | Freq: Every day | ORAL | 1 refills | Status: DC

## 2021-03-03 MED ORDER — ATENOLOL 50 MG PO TABS: 50.0000 mg | ORAL_TABLET | Freq: Every day | ORAL | 1 refills | Status: DC

## 2021-03-03 MED ORDER — BUSPIRONE HCL 15 MG PO TABS: 15.0000 mg | ORAL_TABLET | Freq: Three times a day (TID) | ORAL | 1 refills | Status: DC

## 2021-03-03 MED ORDER — ROSUVASTATIN CALCIUM 5 MG PO TABS: 5.0000 mg | ORAL_TABLET | ORAL | 1 refills | Status: DC

## 2021-03-03 MED ORDER — QUETIAPINE FUMARATE 25 MG PO TABS: 25.0000 mg | ORAL_TABLET | Freq: Every day | ORAL | 1 refills | Status: DC

## 2021-03-03 MED ORDER — FUROSEMIDE 20 MG PO TABS: ORAL_TABLET | ORAL | 0 refills | Status: DC

## 2021-03-03 MED ORDER — ISOSORBIDE MONONITRATE ER 30 MG PO TB24: 30.0000 mg | ORAL_TABLET | Freq: Every day | ORAL | 1 refills | Status: DC

## 2021-03-03 NOTE — Progress Notes (Signed)
BP (!) 148/86   Pulse 65   Ht 5' 4" (1.626 m)   Wt 238 lb (108 kg)   BMI 40.85 kg/m    Subjective:    Patient ID: Hannah Kaiser, female    DOB: 03-19-1961, 60 y.o.   MRN: [id number]  HPI: Hannah Kaiser is a 60 y.o. female  Chief Complaint  Patient presents with   Neck Pain   Leg Swelling    Right leg   Depression   Hyperlipidemia   Hypertension   Had her saliva glands taken out 15-20 years ago. Has been having pain ad swelling in her L sid oe her face into her chin. Feels like it did before. Can push on it and have stuff come into her mouth.   Has been having some mid back pain   HYPERTENSION / [place] Satisfied with current treatment? yes Duration of hypertension: chronic BP monitoring frequency: not checking BP medication side effects: no Duration of hyperlipidemia: chronic Cholesterol medication side effects: no Cholesterol supplements: none Past cholesterol medications: crestor and zetia Medication compliance: excellent compliance Aspirin: yes Recent stressors: yes Recurrent headaches: no Visual changes: no Palpitations: no Dyspnea: no Chest pain: no Lower extremity edema: no Dizzy/lightheaded: no  DEPRESSION Mood status: stable Satisfied with current treatment?: yes Symptom severity: moderate  Duration of current treatment : chronic Side effects: no Medication compliance: excellent compliance Psychotherapy/counseling: no  Previous psychiatric medications: seroquel, cymbalta Depressed mood: yes Anxious mood: yes Anhedonia: no Significant weight loss or gain: no Insomnia: no  Fatigue: yes Feelings of worthlessness or guilt: yes Impaired concentration/indecisiveness: no Suicidal ideations: no Hopelessness: no Crying spells: no Depression screen [facility] 2/9 10/31/2020 08/30/2020 06/15/2019 03/01/2019 02/01/2019  Decreased Interest 0 0 _0 Down, Depressed, Hopeless 0 0 3 0 1  PHQ - 2 Score 0 0 _1 Altered sleeping _2 Tired, decreased  energy _3 Change in appetite _4 Feeling bad or failure about yourself  1 1 0 0 0  Trouble concentrating _5 0 0  Moving slowly or fidgety/restless 0 3 0 0 2  Suicidal thoughts 0 0 0 0 0  PHQ-9 Score _6 Difficult doing work/chores Somewhat difficult Somewhat difficult Extremely dIfficult Very difficult Somewhat difficult  Some recent data might be hidden     Relevant past medical, surgical, family and social history reviewed and updated as indicated. Interim medical history since our last visit reviewed. Allergies and medications reviewed and updated.  Review of Systems  Constitutional: Negative.   HENT: Negative.    Eyes: Negative.   Respiratory: Negative.    Cardiovascular:  Positive for leg swelling. Negative for chest pain and palpitations.  Gastrointestinal: Negative.   Musculoskeletal:  Positive for back pain and myalgias. Negative for arthralgias, gait problem, joint swelling, neck pain and neck stiffness.  Skin: Negative.   Neurological: Negative.   Psychiatric/Behavioral:  Negative for agitation, behavioral problems, confusion, decreased concentration, dysphoric mood, hallucinations, self-injury, sleep disturbance and suicidal ideas. The patient is nervous/anxious. The patient is not hyperactive.    Per HPI unless specifically indicated above     Objective:    BP (!) 148/86   Pulse 65   Ht 5' 4" (1.626 m)   Wt 238 lb (108 kg)   BMI 40.85 kg/m   Wt Readings from Last 3 Encounters:  03/03/21 238 lb (108 kg)  10/31/20 237 lb (107.5 kg)  09/26/20 237 lb 9.6 oz (107.8 kg)    Physical Exam Vitals and nursing note reviewed.  Constitutional:      General: She is not in acute distress.    Appearance: Normal appearance. She is obese. She is not ill-appearing, toxic-appearing or diaphoretic.  HENT:     Head: Normocephalic and atraumatic.     Right Ear: External ear normal.     Left Ear: External ear normal.     Nose: Nose normal.      Mouth/Throat:     Mouth: Mucous membranes are moist.     Pharynx: Oropharynx is clear.  Eyes:     General: No scleral icterus.       Right eye: No discharge.        Left eye: No discharge.     Extraocular Movements: Extraocular movements intact.     Conjunctiva/sclera: Conjunctivae normal.     Pupils: Pupils are equal, round, and reactive to light.  Neck:     Comments: Tightness on L side of submandibular area Cardiovascular:     Rate and Rhythm: Normal rate and regular rhythm.     Pulses: Normal pulses.     Heart sounds: Normal heart sounds. No murmur heard.   No friction rub. No gallop.  Pulmonary:     Effort: Pulmonary effort is normal. No respiratory distress.     Breath sounds: Normal breath sounds. No stridor. No wheezing, rhonchi or rales.  Chest:     Chest wall: No tenderness.  Musculoskeletal:        General: Normal range of motion.     Cervical back: Normal range of motion and neck supple.  Skin:    General: Skin is warm and dry.     Capillary Refill: Capillary refill takes less than 2 seconds.     Coloration: Skin is not jaundiced or pale.     Findings: No bruising, erythema, lesion or rash.  Neurological:     General: No focal deficit present.     Mental Status: She is alert and oriented to person, place, and time. Mental status is at baseline.  Psychiatric:        Mood and Affect: Mood normal.        Behavior: Behavior normal.        Thought Content: Thought content normal.        Judgment: Judgment normal.    Results for orders placed or performed in visit on 10/31/20  Comprehensive metabolic panel  Result Value Ref Range   Glucose 93 65 - 99 mg/dL   BUN 11 6 - 24 mg/dL   Creatinine, Ser 0.80 0.57 - 1.00 mg/dL   eGFR 85 >59 mL/min/1.73   BUN/Creatinine Ratio 14 9 - 23   Sodium 140 134 - 144 mmol/L   Potassium 4.2 3.5 - 5.2 mmol/L   Chloride 105 96 - 106 mmol/L   CO2 19 (L) 20 - 29 mmol/L   Calcium 9.2 8.7 - 10.2 mg/dL   Total Protein 6.3 6.0 - 8.5  g/dL   Albumin 4.7 3.8 - 4.9 g/dL   Globulin, Total 1.6 1.5 - 4.5 g/dL   Albumin/Globulin Ratio 2.9 (H) 1.2 - 2.2   Bilirubin Total 0.6 0.0 - 1.2 mg/dL   Alkaline Phosphatase 69 44 - 121 IU/L   AST 13 0 - 40 IU/L   ALT 10 0 - 32 IU/L  Uric acid  Result Value Ref Range   Uric Acid 3.9  3.0 - 7.2 mg/dL      Assessment & Plan:   Problem List Items Addressed This Visit       Digestive   GERD (gastroesophageal reflux disease)    Under good control on current regimen. Continue current regimen. Continue to monitor. Call with any concerns. Refills given. Labs drawn today.       Relevant Medications   pantoprazole (PROTONIX) 40 MG tablet   Other Relevant Orders   CBC with Differential/Platelet     Genitourinary   Benign hypertensive renal disease - Primary    Under good control on current regimen. Continue current regimen. Continue to monitor. Call with any concerns. Refills given. Labs drawn today.       Relevant Orders   Comprehensive metabolic panel   TSH   Urinalysis, Routine w reflex microscopic   Microalbumin, Urine Waived     Other   Hyperlipidemia    Under good control on current regimen. Continue current regimen. Continue to monitor. Call with any concerns. Refills given. Labs drawn today.       Relevant Medications   rosuvastatin (CRESTOR) 5 MG tablet   losartan (COZAAR) 100 MG tablet   isosorbide mononitrate (IMDUR) 30 MG 24 hr tablet   furosemide (LASIX) 20 MG tablet   ezetimibe (ZETIA) 10 MG tablet   atenolol (TENORMIN) 50 MG tablet   amLODipine (NORVASC) 10 MG tablet   Other Relevant Orders   Comprehensive metabolic panel   Lipid Panel w/o Chol/HDL Ratio   Severe episode of recurrent major depressive disorder, without psychotic features ([place])    Under good control on current regimen. Continue current regimen. Continue to monitor. Call with any concerns. Refills given. Labs drawn today.       Relevant Medications   busPIRone (BUSPAR) 15 MG tablet    hydrOXYzine (ATARAX/VISTARIL) 25 MG tablet   DULoxetine (CYMBALTA) 60 MG capsule   Other Visit Diagnoses     Chronic obstructive pulmonary disease, unspecified COPD type ([place])       Stable. Continue to monitor. Call with any concerns.    Relevant Medications   budesonide-formoterol (SYMBICORT) 80-4.5 MCG/ACT inhaler   albuterol (VENTOLIN HFA) 108 (90 Base) MCG/ACT inhaler   Lung nodule < 6cm on CT       Due for repeat from [facility] at [facility] in May 2021- ordered today.   Relevant Orders   CT Chest Wo Contrast   History of parotidectomy       Seems to be reaccumulating. Will get her into ENT. Referral generated today.   Relevant Orders   Ambulatory referral to ENT   Right leg swelling       Likely venous insuffiency. Will get her into vascular. Referral generated today. Call with any concerns.    Relevant Orders   Ambulatory referral to Vascular Surgery   Morbid obesity ([place])       Encouraged diet and exercise with goal of losing 1-2 lbs per week.    Relevant Orders   Bayer DCA Hb A1c Waived        Follow up plan: Return in about 4 weeks (around 03/31/2021), or physical..

## 2021-03-03 NOTE — Assessment & Plan Note (Signed)
Under good control on current regimen. Continue current regimen. Continue to monitor. Call with any concerns. Refills given. Labs drawn today.   

## 2021-03-04 LAB — CBC WITH DIFFERENTIAL/PLATELET
Basophils Absolute: 0 10*3/uL (ref 0.0–0.2)
Basos: 1 %
EOS (ABSOLUTE): 0.1 10*3/uL (ref 0.0–0.4)
Eos: 2 %
Hematocrit: 38.6 % (ref 34.0–46.6)
Hemoglobin: 13 g/dL (ref 11.1–15.9)
Immature Grans (Abs): 0 10*3/uL (ref 0.0–0.1)
Immature Granulocytes: 0 %
Lymphocytes Absolute: 1.8 10*3/uL (ref 0.7–3.1)
Lymphs: 29 %
MCH: 29.1 pg (ref 26.6–33.0)
MCHC: 33.7 g/dL (ref 31.5–35.7)
MCV: 87 fL (ref 79–97)
Monocytes Absolute: 0.4 10*3/uL (ref 0.1–0.9)
Monocytes: 6 %
Neutrophils Absolute: 3.9 10*3/uL (ref 1.4–7.0)
Neutrophils: 62 %
Platelets: 222 10*3/uL (ref 150–450)
RBC: 4.46 x10E6/uL (ref 3.77–5.28)
RDW: 13.1 % (ref 11.7–15.4)
WBC: 6.2 10*3/uL (ref 3.4–10.8)

## 2021-03-04 LAB — LIPID PANEL W/O CHOL/HDL RATIO
Cholesterol, Total: 174 mg/dL (ref 100–199)
HDL: 47 mg/dL (ref >39)
LDL Chol Calc (NIH): 102 mg/dL — ABNORMAL HIGH (ref 0–99)
Triglycerides: 140 mg/dL (ref 0–149)
VLDL Cholesterol Cal: 25 mg/dL (ref 5–40)

## 2021-03-04 LAB — COMPREHENSIVE METABOLIC PANEL
ALT: 12 IU/L (ref 0–32)
AST: 17 IU/L (ref 0–40)
Albumin/Globulin Ratio: 2.5 — ABNORMAL HIGH (ref 1.2–2.2)
Albumin: 4.8 g/dL (ref 3.8–4.9)
Alkaline Phosphatase: 77 IU/L (ref 44–121)
BUN/Creatinine Ratio: 15 (ref 9–23)
BUN: 12 mg/dL (ref 6–24)
Bilirubin Total: 0.5 mg/dL (ref 0.0–1.2)
CO2: 20 mmol/L (ref 20–29)
Calcium: 9.2 mg/dL (ref 8.7–10.2)
Chloride: 105 mmol/L (ref 96–106)
Creatinine, Ser: 0.78 mg/dL (ref 0.57–1.00)
Globulin, Total: 1.9 g/dL (ref 1.5–4.5)
Glucose: 100 mg/dL — ABNORMAL HIGH (ref 70–99)
Potassium: 4.6 mmol/L (ref 3.5–5.2)
Sodium: 139 mmol/L (ref 134–144)
Total Protein: 6.7 g/dL (ref 6.0–8.5)
eGFR: 88 mL/min/{1.73_m2} (ref >59)

## 2021-03-04 LAB — TSH: TSH: 0.878 u[IU]/mL (ref 0.450–4.500)

## 2021-03-11 ENCOUNTER — Encounter (INDEPENDENT_AMBULATORY_CARE_PROVIDER_SITE_OTHER): Payer: Self-pay | Admitting: Vascular Surgery

## 2021-03-11 ENCOUNTER — Ambulatory Visit (INDEPENDENT_AMBULATORY_CARE_PROVIDER_SITE_OTHER): Payer: 59 | Admitting: Vascular Surgery

## 2021-03-11 ENCOUNTER — Other Ambulatory Visit: Payer: Self-pay

## 2021-03-11 VITALS — BP 159/77 | HR 66 | Ht 65.0 in | Wt 238.0 lb

## 2021-03-11 DIAGNOSIS — M79604 Pain in right leg: Secondary | ICD-10-CM | POA: Diagnosis not present

## 2021-03-11 DIAGNOSIS — M7989 Other specified soft tissue disorders: Secondary | ICD-10-CM | POA: Insufficient documentation

## 2021-03-11 DIAGNOSIS — M79605 Pain in left leg: Secondary | ICD-10-CM

## 2021-03-11 DIAGNOSIS — E782 Mixed hyperlipidemia: Secondary | ICD-10-CM | POA: Diagnosis not present

## 2021-03-11 DIAGNOSIS — F17210 Nicotine dependence, cigarettes, uncomplicated: Secondary | ICD-10-CM | POA: Insufficient documentation

## 2021-03-11 DIAGNOSIS — M79609 Pain in unspecified limb: Secondary | ICD-10-CM | POA: Insufficient documentation

## 2021-03-11 DIAGNOSIS — F17200 Nicotine dependence, unspecified, uncomplicated: Secondary | ICD-10-CM

## 2021-03-11 HISTORY — DX: Other specified soft tissue disorders: M79.89

## 2021-03-11 NOTE — Progress Notes (Signed)
Patient ID: Hannah Kaiser, female   DOB: 09-12-1960, 60 y.o.   MRN: [id number]  Chief Complaint  Patient presents with   New Patient (Initial Visit)    NP consult right leg swelling referred  by [facility]     HPI Hannah Kaiser is a 60 y.o. female.  I am asked to see the patient by Dr. Wynetta Emery for evaluation of leg swelling and pain.  The patient has had prominent leg swelling for years although it has gradually worsened over time.  There is no clear cause or inciting event that started the symptoms.  The right leg is more severely affected than the left leg.  She has severe restless leg syndrome and that also seems to be getting worse.  She also complains of pain and tiredness in her legs with only short distances of walking.  Any incline or stairs are very difficult for her to walk up.  Her mother has been a patient of ours and has had multiple treatments for PAD in the past, and she is very concerned of this per tickly with multiple atherosclerotic risk factors.  No open wounds or infection.  No fevers or chills.  No previous history of DVT or superficial thrombophlebitis to her knowledge   Past Medical History:  Diagnosis Date   Anxiety    Arthritis    knees, thoracic spurs and arthritis   Asthma    Breast pain    CHF (congestive heart failure) (HCC)    Colon polyps    COPD (chronic obstructive pulmonary disease) (HCC)    Depression    Dysrhythmia    GERD (gastroesophageal reflux disease)    Glaucoma    Heart murmur    History of blood transfusion    Hypertension    Motion sickness    cars   Multilevel degenerative disc disease    Neuropathy    Bilateral arms and legs   Nipple discharge    Numbness of both lower extremities    bulging lumbar discs - per pt   Numbness of upper extremity    bilateral - degenerative cervical discs - per pt   Pneumonia    PONV (postoperative nausea and vomiting)    Sleep apnea    has CPAP   Smokers' cough ([place])    Wears dentures     full upper and lower    Past Surgical History:  Procedure Laterality Date   ABDOMINAL HYSTERECTOMY  2014   BACK SURGERY     BELPHAROPTOSIS REPAIR     BROW LIFT Bilateral 03/24/2016   Procedure: BLEPHAROPLASTY;  Surgeon: Karle Starch, MD;  Location: [place];  Service: Ophthalmology;  Laterality: Bilateral;  sleep apnea   CARDIAC CATHETERIZATION     COLONOSCOPY WITH PROPOFOL N/A 10/19/2016   Procedure: COLONOSCOPY WITH PROPOFOL;  Surgeon: Lucilla Lame, MD;  Location: [place];  Service: Endoscopy;  Laterality: N/A;   DILATION AND CURETTAGE OF UTERUS     ECTOPIC PREGNANCY SURGERY     x2   ESOPHAGOGASTRODUODENOSCOPY (EGD) WITH PROPOFOL N/A 10/19/2016   Procedure: ESOPHAGOGASTRODUODENOSCOPY (EGD) WITH PROPOFOL;  Surgeon: Lucilla Lame, MD;  Location: [place];  Service: Endoscopy;  Laterality: N/A;   LEFT HEART CATH AND CORONARY ANGIOGRAPHY Left 12/29/2016   Procedure: Left Heart Cath and Coronary Angiography;  Surgeon: Dionisio David, MD;  Location: [facility];  Service: Cardiovascular;  Laterality: Left;   NECK SURGERY  2003   POLYPECTOMY  10/19/2016  Procedure: POLYPECTOMY;  Surgeon: Lucilla Lame, MD;  Location: [place];  Service: Endoscopy;;   [place], 1998   TUBAL LIGATION       Family History  Problem Relation Age of Onset   Cancer Brother        melanoma   Cancer Maternal Grandfather        prostate   Cancer Maternal Uncle        lung  Mother has had peripheral arterial disease with multiple previous interventions    Social History   Tobacco Use   Smoking status: Every Day    Packs/day: 1.50    Years: 41.00    Pack years: 61.50    Types: Cigarettes   Smokeless tobacco: Never   Tobacco comments:    since age 60  Vaping Use   Vaping Use: Never used  Substance Use Topics   Alcohol use: No    Alcohol/week: 0.0 standard drinks   Drug use: No     Allergies  Allergen Reactions   Abilify  [Aripiprazole] Swelling    Whole  body swelling, retains fluid   Ace Inhibitors Cough    So bad she vomited Other reaction(s): Cough So bad she vomited Other reaction(s): Cough So bad she vomited    Wellbutrin [Bupropion] Other (See Comments)    Worsened depression   Paxlovid [Nirmatrelvir-Ritonavir] Other (See Comments)    Ulcers in mouth   Azithromycin Rash   Erythromycin Rash    Current Outpatient Medications  Medication Sig Dispense Refill   albuterol (VENTOLIN HFA) 108 (90 Base) MCG/ACT inhaler Inhale 2 puffs into the lungs every 6 (six) hours as needed for wheezing or shortness of breath. 18 g 6   amLODipine (NORVASC) 10 MG tablet Take 1 tablet (10 mg total) by mouth daily. 90 tablet 1   atenolol (TENORMIN) 50 MG tablet Take 1 tablet (50 mg total) by mouth daily. 90 tablet 1   budesonide-formoterol (SYMBICORT) 80-4.5 MCG/ACT inhaler Inhale 2 puffs into the lungs 2 (two) times daily. 1 each 3   busPIRone (BUSPAR) 15 MG tablet Take 1 tablet (15 mg total) by mouth 3 (three) times daily. 270 tablet 1   clonazePAM (KLONOPIN) 1 MG tablet Take 1 tablet (1 mg total) by mouth 2 (two) times daily as needed. for anxiety 30 tablet 0   DULoxetine (CYMBALTA) 60 MG capsule Take 1 capsule (60 mg total) by mouth daily. 30 capsule 3   ezetimibe (ZETIA) 10 MG tablet Take 1 tablet (10 mg total) by mouth daily. 90 tablet 1   furosemide (LASIX) 20 MG tablet TAKE 1 TABLET BY MOUTH ONCE DAILY AS NEEDED FOR SWELLING 60 tablet 0   hydrOXYzine (ATARAX/VISTARIL) 25 MG tablet Take 1 tablet (25 mg total) by mouth 3 (three) times daily as needed. 90 tablet 1   ibuprofen (ADVIL) 800 MG tablet Take 1 tablet (800 mg total) by mouth every 8 (eight) hours as needed. 270 tablet 1   isosorbide mononitrate (IMDUR) 30 MG 24 hr tablet Take 1 tablet (30 mg total) by mouth daily. 90 tablet 1   latanoprost (XALATAN) 0.005 % ophthalmic solution Place 1 drop into both eyes at bedtime.     losartan (COZAAR) 100 MG tablet  Take 1 tablet (100 mg total) by mouth daily. 90 tablet 1   naproxen (NAPROSYN) 500 MG tablet Take 1 tablet (500 mg total) by mouth 2 (two) times daily with a meal. 30 tablet 0   nystatin-triamcinolone ointment (MYCOLOG) Apply 1  application topically 2 (two) times daily. 30 g 1   pantoprazole (PROTONIX) 40 MG tablet Take 1 tablet (40 mg total) by mouth 2 (two) times daily. 180 tablet 1   QUEtiapine (SEROQUEL) 25 MG tablet Take 1 tablet (25 mg total) by mouth at bedtime. 90 tablet 1   rosuvastatin (CRESTOR) 5 MG tablet Take 1 tablet (5 mg total) by mouth every other day. 90 tablet 1   triamcinolone ointment (KENALOG) 0.5 % Apply 1 application topically 2 (two) times daily. 30 g 0   valACYclovir (VALTREX) 1000 MG tablet Take 1 tablet (1,000 mg total) by mouth daily. 60 tablet 0   No current facility-administered medications for this visit.      REVIEW OF SYSTEMS (Negative unless checked)  Constitutional: _0 Weight loss  _1 Fever  _2 Chills Cardiac: _3 Chest pain   _4 Chest pressure   _5 Palpitations   _6 Shortness of breath when laying flat   _7 Shortness of breath at rest   _8 Shortness of breath with exertion. Vascular:  _9 Pain in legs with walking   _10 Pain in legs at rest   _11 Pain in legs when laying flat   _12 Claudication   _13 Pain in feet when walking  _14 Pain in feet at rest  _15 Pain in feet when laying flat   _16 History of DVT   _17 Phlebitis   _18 Swelling in legs   _19 Varicose veins   _20 Non-healing ulcers Pulmonary:   _21 Uses home oxygen   _22 Productive cough   _23 Hemoptysis   _24 Wheeze  _25 COPD   _26 Asthma Neurologic:  _27 Dizziness  _28 Blackouts   _29 Seizures   _30 History of stroke   _31 History of TIA  _32 Aphasia   _33 Temporary blindness   _34 Dysphagia   _35 Weakness or numbness in arms   _36 Weakness or numbness in legs Musculoskeletal:  _37 Arthritis   _38 Joint swelling   _39 Joint pain   _40 Low back pain Hematologic:  _41 Easy bruising  _42 Easy bleeding   _43 Hypercoagulable state   _44 Anemic  _45 Hepatitis Gastrointestinal:   _46 Blood in stool   _47 Vomiting blood  _48 Gastroesophageal reflux/heartburn   _49 Abdominal pain Genitourinary:  _50 Chronic kidney disease   _51 Difficult urination  _52 Frequent urination  _53 Burning with urination   _54 Hematuria Skin:  _55 Rashes   _56 Ulcers   _57 Wounds Psychological:  _58 History of anxiety   _59  History of major depression.    Physical Exam BP (!) 159/77   Pulse 66   Ht _60  (1.651 m)   Wt 238 lb (108 kg)   BMI 39.61 kg/m  Gen:  WD/WN, NAD Head: [place]/AT, No temporalis wasting. Ear/Nose/Throat: Hearing grossly intact, nares w/o erythema or drainage, oropharynx w/o Erythema/Exudate Eyes: Conjunctiva clear, sclera non-icteric  Neck: trachea midline.  No JVD.  Pulmonary:  Good air movement, respirations not labored, no use of accessory muscles  Cardiac: RRR, no JVD Vascular:  Vessel Right Left  Radial Palpable Palpable                          DP 1+ 1+  PT NP 1+   Gastrointestinal:. No masses, surgical incisions, or scars. Musculoskeletal: M/S 5/5 throughout.  Extremities without ischemic changes.  No deformity or atrophy.  2+ right lower extremity edema, 1+ left lower extremity edema. Neurologic: Sensation grossly intact in extremities.  Symmetrical.  Speech is fluent. Motor exam as listed above. Psychiatric: Judgment intact, Mood & affect appropriate for pt's clinical situation. Dermatologic: No rashes or ulcers noted.  No cellulitis or open wounds.    Radiology MM 3D SCREEN BREAST BILATERAL  Result Date: 02/12/2021 CLINICAL DATA:  Screening. EXAM: DIGITAL SCREENING BILATERAL MAMMOGRAM WITH TOMOSYNTHESIS AND CAD TECHNIQUE: Bilateral screening digital craniocaudal and mediolateral oblique mammograms were obtained. Bilateral screening digital breast tomosynthesis was performed. The images were evaluated with computer-aided detection. COMPARISON:  Previous exam(s). ACR Breast Density Category b: There are scattered areas of fibroglandular density. FINDINGS: There are no  findings suspicious for malignancy. IMPRESSION: No mammographic evidence of malignancy. A result letter of this screening mammogram will be mailed directly to the patient. RECOMMENDATION: Screening mammogram in one year. (Code:SM-B-01Y) BI-RADS CATEGORY  1: Negative. Electronically Signed   By: Franki Cabot M.D.   On: 02/12/2021 09:17   Labs Recent Results (from the past 2160 hour(s))  Bayer DCA Hb A1c Waived     Status: None   Collection Time: 03/03/21 11:25 AM  Result Value Ref Range   HB A1C (BAYER DCA - WAIVED) 5.4 4.8 - 5.6 %    Comment:          Prediabetes: 5.7 - 6.4          Diabetes: >6.4          Glycemic control for adults with diabetes: <7.0               **Please note reference interval change**   Urinalysis, Routine w reflex microscopic     Status: Abnormal   Collection Time: 03/03/21 11:25 AM  Result Value Ref Range   Specific Gravity, UA 1.010 1.005 - 1.030   pH, UA 6.5 5.0 - 7.5   Color, UA Yellow Yellow   Appearance Ur Clear Clear   Leukocytes,UA Negative Negative   Protein,UA Negative Negative/Trace   Glucose, UA Negative Negative   Ketones, UA Negative Negative   RBC, UA Trace (A) Negative   Bilirubin, UA Negative Negative   Urobilinogen, Ur 0.2 0.2 - 1.0 mg/dL   Nitrite, UA Negative Negative   Microscopic Examination See below:   Microalbumin, Urine Waived     Status: None   Collection Time: 03/03/21 11:25 AM  Result Value Ref Range   Microalb, Ur Waived 10 0 - 19 mg/L   Creatinine, Urine Waived 100 10 - 300 mg/dL   Microalb/Creat Ratio <30 <30 mg/g    Comment:                              Abnormal:       30 - 300                         High Abnormal:           >300   Microscopic Examination     Status: Abnormal   Collection Time: 03/03/21 11:25 AM   Urine  Result Value Ref Range   WBC, UA None seen 0 - 5 /hpf   RBC 0-2 0 - 2 /hpf   Epithelial Cells (non renal) 0-10 0 - 10 /hpf   Bacteria, UA Few (A) None seen/Few   Yeast, UA Present (A) None seen   CBC with Differential/Platelet     Status: None   Collection Time: 03/03/21 11:37 AM  Result Value Ref Range   WBC 6.2 3.4 - 10.8 x10E3/uL   RBC 4.46 3.77 - 5.28 x10E6/uL   Hemoglobin 13.0 11.1 - 15.9 g/dL   Hematocrit 38.6 34.0 - 46.6 %   MCV 87 79 - 97 fL   MCH 29.1 26.6 - 33.0 pg   MCHC  33.7 31.5 - 35.7 g/dL   RDW 13.1 11.7 - 15.4 %   Platelets 222 150 - 450 x10E3/uL   Neutrophils 62 Not Estab. %   Lymphs 29 Not Estab. %   Monocytes 6 Not Estab. %   Eos 2 Not Estab. %   Basos 1 Not Estab. %   Neutrophils Absolute 3.9 1.4 - 7.0 x10E3/uL   Lymphocytes Absolute 1.8 0.7 - 3.1 x10E3/uL   Monocytes Absolute 0.4 0.1 - 0.9 x10E3/uL   EOS (ABSOLUTE) 0.1 0.0 - 0.4 x10E3/uL   Basophils Absolute 0.0 0.0 - 0.2 x10E3/uL   Immature Granulocytes 0 Not Estab. %   Immature Grans (Abs) 0.0 0.0 - 0.1 x10E3/uL  Comprehensive metabolic panel     Status: Abnormal   Collection Time: 03/03/21 11:37 AM  Result Value Ref Range   Glucose 100 (H) 70 - 99 mg/dL    Comment:               **Please note reference interval change**   BUN 12 6 - 24 mg/dL   Creatinine, Ser 0.78 0.57 - 1.00 mg/dL   eGFR 88 >59 mL/min/1.73   BUN/Creatinine Ratio 15 9 - 23   Sodium 139 134 - 144 mmol/L   Potassium 4.6 3.5 - 5.2 mmol/L   Chloride 105 96 - 106 mmol/L   CO2 20 20 - 29 mmol/L   Calcium 9.2 8.7 - 10.2 mg/dL   Total Protein 6.7 6.0 - 8.5 g/dL   Albumin 4.8 3.8 - 4.9 g/dL   Globulin, Total 1.9 1.5 - 4.5 g/dL   Albumin/Globulin Ratio 2.5 (H) 1.2 - 2.2   Bilirubin Total 0.5 0.0 - 1.2 mg/dL   Alkaline Phosphatase 77 44 - 121 IU/L   AST 17 0 - 40 IU/L   ALT 12 0 - 32 IU/L  TSH     Status: None   Collection Time: 03/03/21 11:37 AM  Result Value Ref Range   TSH 0.878 0.450 - 4.500 uIU/mL  Lipid Panel w/o Chol/HDL Ratio     Status: Abnormal   Collection Time: 03/03/21 11:37 AM  Result Value Ref Range   Cholesterol, Total 174 100 - 199 mg/dL   Triglycerides 140 0 - 149 mg/dL   HDL 47 >39 mg/dL   VLDL  Cholesterol Cal 25 5 - 40 mg/dL   LDL Chol Calc (NIH) 102 (H) 0 - 99 mg/dL    Assessment/Plan:  Swelling of limb I have had a long discussion with the patient regarding swelling and why it  causes symptoms.  Patient will begin wearing graduated compression stockings class 1 (20-30 mmHg) on a daily basis a prescription was given. The patient will  beginning wearing the stockings first thing in the morning and removing them in the evening. The patient is instructed specifically not to sleep in the stockings.   In addition, behavioral modification will be initiated.  This will include frequent elevation, use of over the counter pain medications and exercise such as walking.  I have reviewed systemic causes for chronic edema such as liver, kidney and cardiac etiologies.  The patient denies problems with these organ systems.    Consideration for a lymph pump will also be made based upon the effectiveness of conservative therapy.  This would help to improve the edema control and prevent sequela such as ulcers and infections   Patient should undergo duplex ultrasound of the venous system to ensure that DVT or reflux is not present.  The patient will follow-up with  me after the ultrasound.    Hyperlipidemia lipid control important in reducing the progression of atherosclerotic disease. Statin intolerant   Tobacco use disorder Represents a significant atherosclerotic risk factor and given her leg symptoms, PAD will be assessed.  Pain in limb The patient has leg pain that is not entirely clear in its etiology.  It is likely multifactorial, but given her strong family history and tobacco use, PAD is certainly a possibility.  Her symptoms do at times somewhat claudication.  ABIs were checked in the near future at her convenience and we are also performing a venous work-up for part of her leg swelling.  The pathophysiology and natural history of peripheral arterial disease and venous insufficiency  were discussed with the patient.      Hannah Kaiser Pain 03/11/2021, 1:18 PM   This note was created with Dragon medical transcription system.  Any errors from dictation are unintentional.

## 2021-03-11 NOTE — Assessment & Plan Note (Signed)
lipid control important in reducing the progression of atherosclerotic disease. Statin intolerant

## 2021-03-11 NOTE — Assessment & Plan Note (Signed)
The patient has leg pain that is not entirely clear in its etiology.  It is likely multifactorial, but given her strong family history and tobacco use, PAD is certainly a possibility.  Her symptoms do at times somewhat claudication.  ABIs were checked in the near future at her convenience and we are also performing a venous work-up for part of her leg swelling.  The pathophysiology and natural history of peripheral arterial disease and venous insufficiency were discussed with the patient.

## 2021-03-11 NOTE — Assessment & Plan Note (Signed)
Represents a significant atherosclerotic risk factor and given her leg symptoms, PAD will be assessed.

## 2021-03-11 NOTE — Assessment & Plan Note (Signed)

## 2021-03-11 NOTE — Patient Instructions (Signed)
Peripheral Vascular Disease ?Peripheral vascular disease (PVD) is a disease of the blood vessels that carry blood from the heart to the rest of the body. PVD is also called peripheral artery disease (PAD) or poor circulation. PVD affects most of the body. But it affects the legs and feet the most. ?PVD can lead to acute limb ischemia. This happens when there is a sudden stop of blood flow to an arm or leg. This is a medical emergency. ?What are the causes? ?The most common cause of PVD is a buildup of a fatty substance (plaque) inside your arteries. This decreases blood flow. Plaque can break off and block blood in a smaller artery. This can lead to acute limb ischemia. ?Other common causes of PVD include: ?Blood clots inside the blood vessels. ?Injuries to blood vessels. ?Irritation and swelling of blood vessels. ?Sudden tightening of the blood vessel (spasms). ?What increases the risk? ?A family history of PVD. ?Medical conditions, including: ?High cholesterol. ?Diabetes. ?High blood pressure. ?Heart disease. ?Past problems with blood clots. ?Past injury, such as burns or a broken bone. ?Other conditions, such as: ?Buerger's disease. This is caused by swollen or irritated blood vessels in your hands and feet. ?Arthritis. ?Birth defects that affect the arteries in your legs. ?Kidney disease. ?Using tobacco or nicotine products. ?Not getting enough exercise. ?Being very overweight (obese). ?Being 50 years old or older. ?What are the signs or symptoms? ?Cramps in your butt, legs, and feet. ?Pain and weakness in your legs when you are active that goes away when you rest. ?Leg pain when at rest. ?Leg numbness, tingling, or weakness. ?Coldness in a leg or foot, especially when compared with the other leg or foot. ?Skin or hair changes. These can include: ?Hair loss. ?Shiny skin. ?Pale or bluish skin. ?Thick toenails. ?Being unable to get or keep an erection. ?Tiredness (fatigue). ?Weak pulse or no pulse in the  feet. ?Wounds and sores on the toes, feet, or legs. These take longer to heal. ?How is this treated? ?Underlying causes are treated first. Other conditions, like diabetes, high cholesterol, and blood pressure, are also treated. Treatment may include: ?Lifestyle changes, such as: ?Quitting smoking. ?Getting regular exercise. ?Having a diet low in fat and cholesterol. ?Not drinking alcohol. ?Taking medicines, such as: ?Blood thinners. ?Medicines to improve blood flow. ?Medicines to improve your blood cholesterol. ?Procedures to: ?Open the arteries and restore blood flow. ?Insert a small mesh tube (stent) to keep a blocked vessel open. ?Create a new path for blood to flow to the body (peripheral bypass). ?Remove dead tissue from a wound. ?Remove an affected leg or arm. ?Follow these instructions at home: ?Medicines ?Take over-the-counter and prescription medicines only as told by your doctor. ?If you are taking blood thinners: ?Talk with your doctor before you take any medicines that have aspirin, or NSAIDs, such as ibuprofen. ?Take medicines exactly as told. Take them at the same time each day. ?Avoid doing things that could hurt or bruise you. Take action to prevent falls. ?Wear an alert bracelet or carry a card that shows you are taking blood thinners. ?Lifestyle ?  ?Get regular exercise. Ask your doctor about how to stay active. ?Talk with your doctor about keeping a healthy weight. If needed, ask about losing weight. ?Eat a diet that is low in fat and cholesterol. If you need help, talk with your doctor. ?Do not drink alcohol. ?Do not smoke or use any products that contain nicotine or tobacco. If you need help quitting, ask your   doctor. ?General instructions ?Take good care of your feet. To do this: ?Wear shoes that fit well and feel good. ?Check your feet often for any cuts or sores. ?Get a flu shot (influenza vaccine) each year. ?Keep all follow-up visits. ?Where to find more information ?Society for Vascular  Surgery: vascular.org ?American Heart Association: heart.org ?National Heart, Lung, and Blood Institute: nhlbi.nih.gov ?Contact a doctor if: ?You have cramps in your legs when you walk. ?You have leg pain when you rest. ?Your leg or foot feels cold. ?Your skin changes. ?You cannot get or keep an erection. ?You have cuts or sores on your legs or feet that do not heal. ?Get help right away if: ?You have sudden changes in the color and feeling of your arms or legs, such as: ?Your arm or leg turns cold, numb, and blue. ?Your arm or leg becomes red, warm, swollen, painful, or numb. ?You have any signs of a stroke. "BE FAST" is an easy way to remember the main warning signs: ?B - Balance. Dizziness, sudden trouble walking, or loss of balance. ?E - Eyes. Trouble seeing or a change in how you see. ?F - Face. Sudden weakness or loss of feeling of the face. The face or eyelid may droop on one side. ?A - Arms. Weakness or loss of feeling in an arm. This happens all of a sudden and most often on one side of the body. ?S - Speech. Sudden trouble speaking, slurred speech, or trouble understanding what people say. ?T - Time. Time to call emergency services. Write down what time symptoms started. ?You have other signs of a stroke, such as: ?A sudden, very bad headache with no known cause. ?Feeling like you may vomit (nausea). ?Vomiting. ?A seizure. ?You have chest pain or trouble breathing. ?These symptoms may be an emergency. Get help right away. Call your local emergency services (911 in the [place] ?Do not wait to see if the symptoms will go away. ?Do not drive yourself to the hospital. ?Summary ?Peripheral vascular disease (PVD) is a disease of the blood vessels. ?PVD affects the legs and feet the most. ?Symptoms may include leg pain or leg numbness, tingling, and weakness. ?Treatment may include lifestyle changes, medicines, and procedures. ?This information is not intended to replace advice given to you by your health care  provider. Make sure you discuss any questions you have with your health care provider. ?Document Revised: 11/27/2019 Document Reviewed: 11/27/2019 ?Elsevier Patient Education ? 2022 [employer] ? ?

## 2021-03-24 ENCOUNTER — Other Ambulatory Visit: Payer: Self-pay

## 2021-03-24 ENCOUNTER — Ambulatory Visit
Admission: RE | Admit: 2021-03-24 | Discharge: 2021-03-24 | Disposition: A | Discharge status: Home / Self Care | Payer: 59 | Source: Ambulatory Visit | Attending: Family Medicine | Admitting: Family Medicine

## 2021-03-24 DIAGNOSIS — IMO0001 Reserved for inherently not codable concepts without codable children: Secondary | ICD-10-CM

## 2021-03-24 DIAGNOSIS — R911 Solitary pulmonary nodule: Secondary | ICD-10-CM | POA: Diagnosis present

## 2021-03-28 ENCOUNTER — Telehealth: Payer: Self-pay | Admitting: Family Medicine

## 2021-03-28 NOTE — Telephone Encounter (Signed)
Patient aware results are not back.  She has an appointment Monday to follow up.

## 2021-03-28 NOTE — Telephone Encounter (Signed)
Copied from [place] [id number]. Topic: General - Other >> Mar 27, 2021  4:13 PM Celene Kras wrote: Reason for CRM: Pt called and is requesting to have a call back from nurse to go over her CT scan. Please advise.

## 2021-03-28 NOTE — Telephone Encounter (Signed)
Results are not back. Will let her know when they are.

## 2021-03-31 ENCOUNTER — Other Ambulatory Visit: Payer: Self-pay

## 2021-03-31 ENCOUNTER — Other Ambulatory Visit: Payer: Self-pay | Admitting: Family Medicine

## 2021-03-31 ENCOUNTER — Ambulatory Visit (INDEPENDENT_AMBULATORY_CARE_PROVIDER_SITE_OTHER): Payer: 59 | Admitting: Family Medicine

## 2021-03-31 ENCOUNTER — Ambulatory Visit
Admission: RE | Admit: 2021-03-31 | Discharge: 2021-03-31 | Disposition: A | Discharge status: Home / Self Care | Payer: Self-pay | Source: Ambulatory Visit | Attending: Family Medicine | Admitting: Family Medicine

## 2021-03-31 ENCOUNTER — Encounter: Payer: Self-pay | Admitting: Family Medicine

## 2021-03-31 VITALS — BP 119/82 | HR 60 | Temp 98.4°F | Ht 64.0 in | Wt 238.2 lb

## 2021-03-31 DIAGNOSIS — H409 Unspecified glaucoma: Secondary | ICD-10-CM

## 2021-03-31 DIAGNOSIS — Z808 Family history of malignant neoplasm of other organs or systems: Secondary | ICD-10-CM

## 2021-03-31 DIAGNOSIS — I2510 Atherosclerotic heart disease of native coronary artery without angina pectoris: Secondary | ICD-10-CM | POA: Diagnosis not present

## 2021-03-31 DIAGNOSIS — R911 Solitary pulmonary nodule: Secondary | ICD-10-CM

## 2021-03-31 DIAGNOSIS — K219 Gastro-esophageal reflux disease without esophagitis: Secondary | ICD-10-CM

## 2021-03-31 DIAGNOSIS — Z1283 Encounter for screening for malignant neoplasm of skin: Secondary | ICD-10-CM

## 2021-03-31 DIAGNOSIS — Z0000 Encounter for general adult medical examination without abnormal findings: Secondary | ICD-10-CM | POA: Diagnosis not present

## 2021-03-31 NOTE — Assessment & Plan Note (Signed)
Referral back to GI placed today.

## 2021-03-31 NOTE — Assessment & Plan Note (Signed)
Referral back to cardiology placed today.

## 2021-03-31 NOTE — Progress Notes (Signed)
BP 119/82   Pulse 60   Temp 98.4 F (36.9 C) (Oral)   Ht 5' 4"  (1.626 m)   Wt 238 lb 3.2 oz (108 kg)   SpO2 98%   BMI 40.89 kg/m    Subjective:    Patient ID: Hannah Kaiser, female    DOB: 11/07/1960, 60 y.o.   MRN: [id number]  HPI: Hannah Kaiser is a 60 y.o. female presenting on 03/31/2021 for comprehensive medical examination. Current medical complaints include: having a lot of stress with her family.  She currently lives with: sister and her husband Menopausal Symptoms: no  Depression Screen done today and results listed below:  Depression screen [facility] 2/9 03/31/2021 10/31/2020 08/30/2020 06/15/2019 03/01/2019  Decreased Interest 2 0 0 3 1  Down, Depressed, Hopeless 2 0 0 3 0  PHQ - 2 Score 4 0 0 6 1  Altered sleeping 1 1 2 3 3   Tired, decreased energy 3 2 3 3 3   Change in appetite 1 3 2 3 3   Feeling bad or failure about yourself  3 1 1  0 0  Trouble concentrating 2 1 1 1  0  Moving slowly or fidgety/restless 2 0 3 0 0  Suicidal thoughts 0 0 0 0 0  PHQ-9 Score 16 8 12 16 10   Difficult doing work/chores Somewhat difficult Somewhat difficult Somewhat difficult Extremely dIfficult Very difficult  Some recent data might be hidden    Past Medical History:  Past Medical History:  Diagnosis Date   Anxiety    Arthritis    knees, thoracic spurs and arthritis   Asthma    Breast pain    CHF (congestive heart failure) (HCC)    Colon polyps    COPD (chronic obstructive pulmonary disease) (HCC)    Depression    Dysrhythmia    GERD (gastroesophageal reflux disease)    Glaucoma    Heart murmur    History of blood transfusion    Hypertension    Motion sickness    cars   Multilevel degenerative disc disease    Neuropathy    Bilateral arms and legs   Nipple discharge    Numbness of both lower extremities    bulging lumbar discs - per pt   Numbness of upper extremity    bilateral - degenerative cervical discs - per pt   Pneumonia    PONV (postoperative nausea and vomiting)     Sleep apnea    has CPAP   Smokers' cough ([place])    Wears dentures    full upper and lower    Surgical History:  Past Surgical History:  Procedure Laterality Date   BACK SURGERY     BELPHAROPTOSIS REPAIR     BROW LIFT Bilateral 03/24/2016   Procedure: BLEPHAROPLASTY;  Surgeon: Karle Starch, MD;  Location: [place];  Service: Ophthalmology;  Laterality: Bilateral;  sleep apnea   CARDIAC CATHETERIZATION     COLONOSCOPY WITH PROPOFOL N/A 10/19/2016   Procedure: COLONOSCOPY WITH PROPOFOL;  Surgeon: Lucilla Lame, MD;  Location: [place];  Service: Endoscopy;  Laterality: N/A;   DILATION AND CURETTAGE OF UTERUS     ECTOPIC PREGNANCY SURGERY     x2   ESOPHAGOGASTRODUODENOSCOPY (EGD) WITH PROPOFOL N/A 10/19/2016   Procedure: ESOPHAGOGASTRODUODENOSCOPY (EGD) WITH PROPOFOL;  Surgeon: Lucilla Lame, MD;  Location: [place];  Service: Endoscopy;  Laterality: N/A;   LEFT HEART CATH AND CORONARY ANGIOGRAPHY Left 12/29/2016   Procedure: Left Heart Cath and Coronary Angiography;  Surgeon: Dionisio David, MD;  Location: [place] [facility];  Service: Cardiovascular;  Laterality: Left;   NECK SURGERY  2003   POLYPECTOMY  10/19/2016   Procedure: POLYPECTOMY;  Surgeon: Lucilla Lame, MD;  Location: [place];  Service: Endoscopy;;   [place], 1998   TOTAL ABDOMINAL HYSTERECTOMY W/ BILATERAL SALPINGOOPHORECTOMY  2014   TUBAL LIGATION      Medications:  Current Outpatient Medications on File Prior to Visit  Medication Sig   albuterol (VENTOLIN HFA) 108 (90 Base) MCG/ACT inhaler Inhale 2 puffs into the lungs every 6 (six) hours as needed for wheezing or shortness of breath.   amLODipine (NORVASC) 10 MG tablet Take 1 tablet (10 mg total) by mouth daily.   atenolol (TENORMIN) 50 MG tablet Take 1 tablet (50 mg total) by mouth daily.   budesonide-formoterol (SYMBICORT) 80-4.5 MCG/ACT inhaler Inhale 2 puffs into the lungs 2 (two) times daily.   busPIRone  (BUSPAR) 15 MG tablet Take 1 tablet (15 mg total) by mouth 3 (three) times daily.   clonazePAM (KLONOPIN) 1 MG tablet Take 1 tablet (1 mg total) by mouth 2 (two) times daily as needed. for anxiety   DULoxetine (CYMBALTA) 60 MG capsule Take 1 capsule (60 mg total) by mouth daily.   ezetimibe (ZETIA) 10 MG tablet Take 1 tablet (10 mg total) by mouth daily.   furosemide (LASIX) 20 MG tablet TAKE 1 TABLET BY MOUTH ONCE DAILY AS NEEDED FOR SWELLING   hydrOXYzine (ATARAX/VISTARIL) 25 MG tablet Take 1 tablet (25 mg total) by mouth 3 (three) times daily as needed.   ibuprofen (ADVIL) 800 MG tablet Take 1 tablet (800 mg total) by mouth every 8 (eight) hours as needed.   isosorbide mononitrate (IMDUR) 30 MG 24 hr tablet Take 1 tablet (30 mg total) by mouth daily.   latanoprost (XALATAN) 0.005 % ophthalmic solution Place 1 drop into both eyes at bedtime.   losartan (COZAAR) 100 MG tablet Take 1 tablet (100 mg total) by mouth daily.   naproxen (NAPROSYN) 500 MG tablet Take 1 tablet (500 mg total) by mouth 2 (two) times daily with a meal.   nystatin-triamcinolone ointment (MYCOLOG) Apply 1 application topically 2 (two) times daily.   pantoprazole (PROTONIX) 40 MG tablet Take 1 tablet (40 mg total) by mouth 2 (two) times daily.   QUEtiapine (SEROQUEL) 25 MG tablet Take 1 tablet (25 mg total) by mouth at bedtime.   rosuvastatin (CRESTOR) 5 MG tablet Take 1 tablet (5 mg total) by mouth every other day.   triamcinolone ointment (KENALOG) 0.5 % Apply 1 application topically 2 (two) times daily.   valACYclovir (VALTREX) 1000 MG tablet Take 1 tablet (1,000 mg total) by mouth daily.   No current facility-administered medications on file prior to visit.    Allergies:  Allergies  Allergen Reactions   Abilify [Aripiprazole] Swelling    Whole  body swelling, retains fluid   Ace Inhibitors Cough    So bad she vomited Other reaction(s): Cough So bad she vomited Other reaction(s): Cough So bad she vomited     Wellbutrin [Bupropion] Other (See Comments)    Worsened depression   Paxlovid [Nirmatrelvir-Ritonavir] Other (See Comments)    Ulcers in mouth   Azithromycin Rash   Erythromycin Rash    Social History:  Social History   Socioeconomic History   Marital status: Legally Separated    Spouse name: Not on file   Number of children: Not on file   Years of  education: Not on file   Highest education level: Not on file  Occupational History   Not on file  Tobacco Use   Smoking status: Every Day    Packs/day: 1.50    Years: 41.00    Pack years: 61.50    Types: Cigarettes   Smokeless tobacco: Never   Tobacco comments:    since age 36  Vaping Use   Vaping Use: Never used  Substance and Sexual Activity   Alcohol use: No    Alcohol/week: 0.0 standard drinks   Drug use: No   Sexual activity: Not Currently    Birth control/protection: Surgical  Other Topics Concern   Not on file  Social History Narrative   Not on file   Social Determinants of Health   Financial Resource Strain: Not on file  Food Insecurity: Not on file  Transportation Needs: Not on file  Physical Activity: Not on file  Stress: Not on file  Social Connections: Not on file  Intimate Partner Violence: Not on file   Social History   Tobacco Use  Smoking Status Every Day   Packs/day: 1.50   Years: 41.00   Pack years: 61.50   Types: Cigarettes  Smokeless Tobacco Never  Tobacco Comments   since age 21   Social History   Substance and Sexual Activity  Alcohol Use No   Alcohol/week: 0.0 standard drinks    Family History:  Family History  Problem Relation Age of Onset   Cancer Brother        melanoma   Cancer Maternal Grandfather        prostate   Cancer Maternal Uncle        lung    Past medical history, surgical history, medications, allergies, family history and social history reviewed with patient today and changes made to appropriate areas of the chart.   Review of Systems   Constitutional: Negative.   HENT: Negative.         Clogged L ear chronically hoarseness  Eyes:  Positive for blurred vision. Negative for double vision, photophobia, pain, discharge and redness.  Respiratory:  Positive for shortness of breath. Negative for cough, hemoptysis, sputum production and wheezing.   Cardiovascular:  Positive for chest pain (chronic) and palpitations. Negative for orthopnea, claudication, leg swelling and PND.  Gastrointestinal:  Positive for heartburn. Negative for abdominal pain, blood in stool, constipation, diarrhea, melena, nausea and vomiting.  Genitourinary: Negative.   Musculoskeletal:  Positive for back pain, joint pain and myalgias. Negative for falls and neck pain.  Skin: Negative.        Spot on R upper chest  Neurological:  Positive for tingling. Negative for dizziness, tremors, sensory change, speech change, focal weakness, seizures, loss of consciousness, weakness and headaches.  Endo/Heme/Allergies:  Negative for environmental allergies and polydipsia. Bruises/bleeds easily.  Psychiatric/Behavioral:  Positive for depression. Negative for hallucinations, memory loss, substance abuse and suicidal ideas. The patient is nervous/anxious. The patient does not have insomnia.   All other ROS negative except what is listed above and in the HPI.      Objective:    BP 119/82   Pulse 60   Temp 98.4 F (36.9 C) (Oral)   Ht 5' 4"  (1.626 m)   Wt 238 lb 3.2 oz (108 kg)   SpO2 98%   BMI 40.89 kg/m   Wt Readings from Last 3 Encounters:  03/31/21 238 lb 3.2 oz (108 kg)  03/11/21 238 lb (108 kg)  03/03/21 238 lb (108  kg)    Physical Exam Vitals and nursing note reviewed.  Constitutional:      General: She is not in acute distress.    Appearance: Normal appearance. She is not ill-appearing, toxic-appearing or diaphoretic.  HENT:     Head: Normocephalic and atraumatic.     Right Ear: Tympanic membrane, ear canal and external ear normal. There is no  impacted cerumen.     Left Ear: Tympanic membrane, ear canal and external ear normal. There is no impacted cerumen.     Nose: Nose normal. No congestion or rhinorrhea.     Mouth/Throat:     Mouth: Mucous membranes are moist.     Pharynx: Oropharynx is clear. No oropharyngeal exudate or posterior oropharyngeal erythema.  Eyes:     General: No scleral icterus.       Right eye: No discharge.        Left eye: No discharge.     Extraocular Movements: Extraocular movements intact.     Conjunctiva/sclera: Conjunctivae normal.     Pupils: Pupils are equal, round, and reactive to light.  Neck:     Vascular: No carotid bruit.  Cardiovascular:     Rate and Rhythm: Normal rate and regular rhythm.     Pulses: Normal pulses.     Heart sounds: No murmur heard.   No friction rub. No gallop.  Pulmonary:     Effort: Pulmonary effort is normal. No respiratory distress.     Breath sounds: Normal breath sounds. No stridor. No wheezing, rhonchi or rales.  Chest:     Chest wall: No tenderness.  Abdominal:     General: Abdomen is flat. Bowel sounds are normal. There is no distension.     Palpations: Abdomen is soft. There is no mass.     Tenderness: There is no abdominal tenderness. There is no right CVA tenderness, left CVA tenderness, guarding or rebound.     Hernia: No hernia is present.  Genitourinary:    Comments: Breast and pelvic exams deferred with shared decision making Musculoskeletal:        General: No swelling, tenderness, deformity or signs of injury.     Cervical back: Normal range of motion and neck supple. No rigidity. No muscular tenderness.     Right lower leg: No edema.     Left lower leg: No edema.  Lymphadenopathy:     Cervical: No cervical adenopathy.  Skin:    General: Skin is warm and dry.     Capillary Refill: Capillary refill takes less than 2 seconds.     Coloration: Skin is not jaundiced or pale.     Findings: No bruising, erythema, lesion or rash.  Neurological:      General: No focal deficit present.     Mental Status: She is alert and oriented to person, place, and time. Mental status is at baseline.     Cranial Nerves: No cranial nerve deficit.     Sensory: No sensory deficit.     Motor: No weakness.     Coordination: Coordination normal.     Gait: Gait normal.     Deep Tendon Reflexes: Reflexes normal.  Psychiatric:        Mood and Affect: Mood normal.        Behavior: Behavior normal.        Thought Content: Thought content normal.        Judgment: Judgment normal.    Results for orders placed or performed in visit on 03/03/21  Microscopic Examination   Urine  Result Value Ref Range   WBC, UA None seen 0 - 5 /hpf   RBC 0-2 0 - 2 /hpf   Epithelial Cells (non renal) 0-10 0 - 10 /hpf   Bacteria, UA Few (A) None seen/Few   Yeast, UA Present (A) None seen  Bayer DCA Hb A1c Waived  Result Value Ref Range   HB A1C (BAYER DCA - WAIVED) 5.4 4.8 - 5.6 %  CBC with Differential/Platelet  Result Value Ref Range   WBC 6.2 3.4 - 10.8 x10E3/uL   RBC 4.46 3.77 - 5.28 x10E6/uL   Hemoglobin 13.0 11.1 - 15.9 g/dL   Hematocrit 38.6 34.0 - 46.6 %   MCV 87 79 - 97 fL   MCH 29.1 26.6 - 33.0 pg   MCHC 33.7 31.5 - 35.7 g/dL   RDW 13.1 11.7 - 15.4 %   Platelets 222 150 - 450 x10E3/uL   Neutrophils 62 Not Estab. %   Lymphs 29 Not Estab. %   Monocytes 6 Not Estab. %   Eos 2 Not Estab. %   Basos 1 Not Estab. %   Neutrophils Absolute 3.9 1.4 - 7.0 x10E3/uL   Lymphocytes Absolute 1.8 0.7 - 3.1 x10E3/uL   Monocytes Absolute 0.4 0.1 - 0.9 x10E3/uL   EOS (ABSOLUTE) 0.1 0.0 - 0.4 x10E3/uL   Basophils Absolute 0.0 0.0 - 0.2 x10E3/uL   Immature Granulocytes 0 Not Estab. %   Immature Grans (Abs) 0.0 0.0 - 0.1 x10E3/uL  Comprehensive metabolic panel  Result Value Ref Range   Glucose 100 (H) 70 - 99 mg/dL   BUN 12 6 - 24 mg/dL   Creatinine, Ser 0.78 0.57 - 1.00 mg/dL   eGFR 88 >59 mL/min/1.73   BUN/Creatinine Ratio 15 9 - 23   Sodium 139 134 - 144 mmol/L    Potassium 4.6 3.5 - 5.2 mmol/L   Chloride 105 96 - 106 mmol/L   CO2 20 20 - 29 mmol/L   Calcium 9.2 8.7 - 10.2 mg/dL   Total Protein 6.7 6.0 - 8.5 g/dL   Albumin 4.8 3.8 - 4.9 g/dL   Globulin, Total 1.9 1.5 - 4.5 g/dL   Albumin/Globulin Ratio 2.5 (H) 1.2 - 2.2   Bilirubin Total 0.5 0.0 - 1.2 mg/dL   Alkaline Phosphatase 77 44 - 121 IU/L   AST 17 0 - 40 IU/L   ALT 12 0 - 32 IU/L  TSH  Result Value Ref Range   TSH 0.878 0.450 - 4.500 uIU/mL  Urinalysis, Routine w reflex microscopic  Result Value Ref Range   Specific Gravity, UA 1.010 1.005 - 1.030   pH, UA 6.5 5.0 - 7.5   Color, UA Yellow Yellow   Appearance Ur Clear Clear   Leukocytes,UA Negative Negative   Protein,UA Negative Negative/Trace   Glucose, UA Negative Negative   Ketones, UA Negative Negative   RBC, UA Trace (A) Negative   Bilirubin, UA Negative Negative   Urobilinogen, Ur 0.2 0.2 - 1.0 mg/dL   Nitrite, UA Negative Negative   Microscopic Examination See below:   Microalbumin, Urine Waived  Result Value Ref Range   Microalb, Ur Waived 10 0 - 19 mg/L   Creatinine, Urine Waived 100 10 - 300 mg/dL   Microalb/Creat Ratio <30 <30 mg/g  Lipid Panel w/o Chol/HDL Ratio  Result Value Ref Range   Cholesterol, Total 174 100 - 199 mg/dL   Triglycerides 140 0 - 149 mg/dL   HDL 47 >39 mg/dL  VLDL Cholesterol Cal 25 5 - 40 mg/dL   LDL Chol Calc (NIH) 102 (H) 0 - 99 mg/dL      Assessment & Plan:   Problem List Items Addressed This Visit       Cardiovascular and Mediastinum   CAD (coronary artery disease)    Referral back to cardiology placed today.      Relevant Orders   Ambulatory referral to Cardiology     Digestive   GERD (gastroesophageal reflux disease)    Referral back to GI placed today.      Relevant Orders   Ambulatory referral to Gastroenterology   Other Visit Diagnoses     Routine general medical examination at a health care facility    -  Primary   Vaccines up to date. Screening labs checked  last visit. Pap N/A. Mammo and colonoscopy up to date. Continue diet and exercise. Call with any concerns.   Screening for skin cancer       Referral to derm placed today   Relevant Orders   Ambulatory referral to Dermatology   Family history of melanoma       Referral to derm placed today   Relevant Orders   Ambulatory referral to Dermatology   Glaucoma, unspecified glaucoma type, unspecified laterality       Referral to opthalmology placed today   Relevant Orders   Ambulatory referral to Ophthalmology        Follow up plan: Return in about 6 months (around 09/29/2021).   LABORATORY TESTING:  - Pap smear: not applicable  IMMUNIZATIONS:   - Tdap: Tetanus vaccination status reviewed: last tetanus booster within 10 years. - Influenza: Up to date - Pneumovax: Up to date - Prevnar: Not applicable - COVID: Up to date - Shingrix vaccine: Refused  SCREENING: -Mammogram: Up to date  - Colonoscopy: Up to date   PATIENT COUNSELING:   Advised to take 1 mg of folate supplement per day if capable of pregnancy.   Sexuality: Discussed sexually transmitted diseases, partner selection, use of condoms, avoidance of unintended pregnancy  and contraceptive alternatives.   Advised to avoid cigarette smoking.  I discussed with the patient that most people either abstain from alcohol or drink within safe limits (<=14/week and <=4 drinks/occasion for males, <=7/weeks and <= 3 drinks/occasion for females) and that the risk for alcohol disorders and other health effects rises proportionally with the number of drinks per week and how often a drinker exceeds daily limits.  Discussed cessation/primary prevention of drug use and availability of treatment for abuse.   Diet: Encouraged to adjust caloric intake to maintain  or achieve ideal body weight, to reduce intake of dietary saturated fat and total fat, to limit sodium intake by avoiding high sodium foods and not adding table salt, and to maintain  adequate dietary potassium and calcium preferably from fresh fruits, vegetables, and low-fat dairy products.    stressed the importance of regular exercise  Injury prevention: Discussed safety belts, safety helmets, smoke detector, smoking near bedding or upholstery.   Dental health: Discussed importance of regular tooth brushing, flossing, and dental visits.    NEXT PREVENTATIVE PHYSICAL DUE IN 1 YEAR. Return in about 6 months (around 09/29/2021).

## 2021-04-01 ENCOUNTER — Ambulatory Visit: Payer: 59 | Admitting: Gastroenterology

## 2021-04-01 ENCOUNTER — Ambulatory Visit (INDEPENDENT_AMBULATORY_CARE_PROVIDER_SITE_OTHER): Payer: 59 | Admitting: Gastroenterology

## 2021-04-01 ENCOUNTER — Encounter: Payer: Self-pay | Admitting: Gastroenterology

## 2021-04-01 VITALS — BP 137/85 | HR 55 | Temp 97.8°F | Ht 64.0 in | Wt 238.0 lb

## 2021-04-01 DIAGNOSIS — R1319 Other dysphagia: Secondary | ICD-10-CM

## 2021-04-01 DIAGNOSIS — K219 Gastro-esophageal reflux disease without esophagitis: Secondary | ICD-10-CM | POA: Diagnosis not present

## 2021-04-01 DIAGNOSIS — K7689 Other specified diseases of liver: Secondary | ICD-10-CM | POA: Diagnosis not present

## 2021-04-01 NOTE — Progress Notes (Signed)
Hannah Kaiser [address]  [place]  [place], [place] [address]  Main: [phone]  Fax: [phone]   Gastroenterology Consultation  Referring Provider:     Valerie Roys, DO Primary Care Physician:  Valerie Roys, DO Reason for Consultation:     Abdominal pain        HPI:    Chief complaint: Abdominal pain  Hannah Kaiser is a 59 y.o. y/o female referred for consultation & management  by Dr. Wynetta Emery, Megan P, DO.  Patient reports left upper quadrant abdominal pain for the last 2 to 3 months.  Intermittent.  Occurs with or without meals.  No nausea or vomiting.  Dull, nonradiating.  5/10.  No weight loss.  No altered bowel habits or blood in stool.  Reports taking Protonix 40 mg twice daily for 1 year.  Prior to this was on once daily dosing, however had significant indigestion symptoms and therefore medication was increased to twice daily.  With this dosing she reports breakthrough symptoms only when she eats certain foods such as spaghetti and this occurs about once or twice a week.  Has also started noticing dysphagia to solid foods over the last 2 to 3 months.  No episodes of food impaction.  Patient has had previous EGD and colonoscopy with Dr. Allen Norris in 2018.  Gastric erythema seen on EGD and biopsies showed H. pylori.  Patient was treated with antibiotics at the time.  Does not recall eradication testing being done after that.  Colonoscopy showed hyperplastic polyps and was done for history of polyps.  Repeat was recommended in 5 years.  Past Medical History:  Diagnosis Date   Anxiety    Arthritis    knees, thoracic spurs and arthritis   Asthma    Breast pain    CHF (congestive heart failure) (HCC)    Colon polyps    COPD (chronic obstructive pulmonary disease) (HCC)    Depression    Dysrhythmia    GERD (gastroesophageal reflux disease)    Glaucoma    Heart murmur    History of blood transfusion    Hypertension    Motion sickness    cars    Multilevel degenerative disc disease    Neuropathy    Bilateral arms and legs   Nipple discharge    Numbness of both lower extremities    bulging lumbar discs - per pt   Numbness of upper extremity    bilateral - degenerative cervical discs - per pt   Pneumonia    PONV (postoperative nausea and vomiting)    Sleep apnea    has CPAP   Smokers' cough ([place])    Wears dentures    full upper and lower    Past Surgical History:  Procedure Laterality Date   BACK SURGERY     BELPHAROPTOSIS REPAIR     BROW LIFT Bilateral 03/24/2016   Procedure: BLEPHAROPLASTY;  Surgeon: Karle Starch, MD;  Location: [facility];  Service: Ophthalmology;  Laterality: Bilateral;  sleep apnea   CARDIAC CATHETERIZATION     COLONOSCOPY WITH PROPOFOL N/A 10/19/2016   Procedure: COLONOSCOPY WITH PROPOFOL;  Surgeon: Lucilla Lame, MD;  Location: [facility];  Service: Endoscopy;  Laterality: N/A;   DILATION AND CURETTAGE OF UTERUS     ECTOPIC PREGNANCY SURGERY     x2   ESOPHAGOGASTRODUODENOSCOPY (EGD) WITH PROPOFOL N/A 10/19/2016   Procedure: ESOPHAGOGASTRODUODENOSCOPY (EGD) WITH PROPOFOL;  Surgeon: Lucilla Lame, MD;  Location: [facility];  Service: Endoscopy;  Laterality: N/A;   LEFT HEART CATH AND CORONARY ANGIOGRAPHY Left 12/29/2016   Procedure: Left Heart Cath and Coronary Angiography;  Surgeon: Dionisio David, MD;  Location: [facility];  Service: Cardiovascular;  Laterality: Left;   NECK SURGERY  2003   POLYPECTOMY  10/19/2016   Procedure: POLYPECTOMY;  Surgeon: Lucilla Lame, MD;  Location: [facility];  Service: Endoscopy;;   [place], 1998   TOTAL ABDOMINAL HYSTERECTOMY W/ BILATERAL SALPINGOOPHORECTOMY  2014   TUBAL LIGATION      Prior to Admission medications   Medication Sig Start Date End Date Taking? Authorizing Provider  amLODipine (NORVASC) 10 MG tablet Take 1 tablet (10 mg total) by mouth daily. 03/03/21  Yes Johnson, Megan P, DO  atenolol  (TENORMIN) 50 MG tablet Take 1 tablet (50 mg total) by mouth daily. 03/03/21  Yes Johnson, Megan P, DO  busPIRone (BUSPAR) 15 MG tablet Take 1 tablet (15 mg total) by mouth 3 (three) times daily. 03/03/21  Yes Johnson, Megan P, DO  clonazePAM (KLONOPIN) 1 MG tablet Take 1 tablet (1 mg total) by mouth 2 (two) times daily as needed. for anxiety 03/03/21  Yes Johnson, Megan P, DO  DULoxetine (CYMBALTA) 60 MG capsule Take 1 capsule (60 mg total) by mouth daily. 03/03/21  Yes Johnson, Megan P, DO  ezetimibe (ZETIA) 10 MG tablet Take 1 tablet (10 mg total) by mouth daily. 03/03/21  Yes Johnson, Megan P, DO  furosemide (LASIX) 20 MG tablet TAKE 1 TABLET BY MOUTH ONCE DAILY AS NEEDED FOR SWELLING 03/03/21 03/03/22 Yes Johnson, Megan P, DO  hydrOXYzine (ATARAX/VISTARIL) 25 MG tablet Take 1 tablet (25 mg total) by mouth 3 (three) times daily as needed. 03/03/21  Yes Johnson, Megan P, DO  ibuprofen (ADVIL) 800 MG tablet Take 1 tablet (800 mg total) by mouth every 8 (eight) hours as needed. 08/30/20  Yes Jon Billings, NP  isosorbide mononitrate (IMDUR) 30 MG 24 hr tablet Take 1 tablet (30 mg total) by mouth daily. 03/03/21  Yes Johnson, Megan P, DO  latanoprost (XALATAN) 0.005 % ophthalmic solution Place 1 drop into both eyes at bedtime.   Yes [provider]  losartan (COZAAR) 100 MG tablet Take 1 tablet (100 mg total) by mouth daily. 03/03/21  Yes Johnson, Megan P, DO  naproxen (NAPROSYN) 500 MG tablet Take 1 tablet (500 mg total) by mouth 2 (two) times daily with a meal. 10/31/20  Yes Johnson, Megan P, DO  nystatin-triamcinolone ointment (MYCOLOG) Apply 1 application topically 2 (two) times daily. 08/30/20  Yes Jon Billings, NP  pantoprazole (PROTONIX) 40 MG tablet Take 1 tablet (40 mg total) by mouth 2 (two) times daily. 03/03/21  Yes Johnson, Megan P, DO  QUEtiapine (SEROQUEL) 25 MG tablet Take 1 tablet (25 mg total) by mouth at bedtime. 03/03/21  Yes Johnson, Megan P, DO  rosuvastatin (CRESTOR) 5 MG  tablet Take 1 tablet (5 mg total) by mouth every other day. 03/03/21  Yes Johnson, Megan P, DO  triamcinolone ointment (KENALOG) 0.5 % Apply 1 application topically 2 (two) times daily. 08/30/20  Yes Jon Billings, NP  valACYclovir (VALTREX) 1000 MG tablet Take 1 tablet (1,000 mg total) by mouth daily. 03/03/21  Yes Johnson, Megan P, DO  albuterol (VENTOLIN HFA) 108 (90 Base) MCG/ACT inhaler Inhale 2 puffs into the lungs every 6 (six) hours as needed for wheezing or shortness of breath. Patient not taking: Reported on 04/01/2021 03/03/21   Valerie Roys, DO  budesonide-formoterol (SYMBICORT) 80-4.5 MCG/ACT inhaler Inhale 2 puffs into the lungs 2 (two) times daily. Patient not taking: Reported on 04/01/2021 03/03/21   Valerie Roys, DO    Family History  Problem Relation Age of Onset   Cancer Brother        melanoma   Cancer Maternal Grandfather        prostate   Cancer Maternal Uncle        lung     Social History   Tobacco Use   Smoking status: Every Day    Packs/day: 1.50    Years: 41.00    Pack years: 61.50    Types: Cigarettes   Smokeless tobacco: Never   Tobacco comments:    since age 46  Vaping Use   Vaping Use: Never used  Substance Use Topics   Alcohol use: No    Alcohol/week: 0.0 standard drinks   Drug use: No    Allergies as of 04/01/2021 - Review Complete 04/01/2021  Allergen Reaction Noted   Abilify [aripiprazole] Swelling 03/27/2016   Ace inhibitors Cough 03/13/2015   Wellbutrin [bupropion] Other (See Comments) 12/03/2015   Paxlovid [nirmatrelvir-ritonavir] Other (See Comments) 03/03/2021   Azithromycin Rash 03/13/2015   Erythromycin Rash 08/21/2013    Review of Systems:    All systems reviewed and negative except where noted in HPI.   Physical Exam:  Constitutional: General:   Alert,  Well-developed, well-nourished, pleasant and cooperative in NAD BP 137/85   Pulse (!) 55   Temp 97.8 F (36.6 C) (Oral)   Ht 5\' 4"  (1.626 m)   Wt 238 lb (108  kg)   BMI 40.85 kg/m   Eyes:  Sclera clear, no icterus.   Conjunctiva pink. PERRLA  Ears:  No scars, lesions or masses, Normal auditory acuity. Nose:  No deformity, discharge, or lesions. Mouth:  No deformity or lesions, oropharynx pink & moist.  Neck:  Supple; no masses or thyromegaly.  Respiratory: Normal respiratory effort, Normal percussion  Gastrointestinal: Soft, mildly tender to palpation epigastric region and left upper quadrant, and non-distended without masses, hepatosplenomegaly or hernias noted.  No guarding or rebound tenderness.     Cardiac: No clubbing or edema.  No cyanosis. Normal posterior tibial pedal pulses noted.  Lymphatic:  No significant cervical or axillary adenopathy.  Psych:  Alert and cooperative. Normal mood and affect.  Musculoskeletal:  Normal gait. Head normocephalic, atraumatic. Symmetrical without gross deformities. 5/5 Upper and Lower extremity strength bilaterally.  Skin: Warm. Intact without significant lesions or rashes. No jaundice.  Neurologic:  Face symmetrical, tongue midline, Normal sensation to touch;  grossly normal neurologically.  Psych:  Alert and oriented x3, Alert and cooperative. Normal mood and affect.   Labs: CBC    Component Value Date/Time   WBC 6.2 03/03/2021 1137   WBC 6.2 01/14/2017 1058   RBC 4.46 03/03/2021 1137   RBC 4.23 01/14/2017 1058   HGB 13.0 03/03/2021 1137   HCT 38.6 03/03/2021 1137   PLT 222 03/03/2021 1137   MCV 87 03/03/2021 1137   MCV 90 08/30/2013 1915   MCH 29.1 03/03/2021 1137   MCH 31.0 01/14/2017 1058   MCHC 33.7 03/03/2021 1137   MCHC 34.0 01/14/2017 1058   RDW 13.1 03/03/2021 1137   RDW 12.7 08/30/2013 1915   LYMPHSABS 1.8 03/03/2021 1137   MONOABS 0.5 12/16/2016 1603   EOSABS 0.1 03/03/2021 1137   BASOSABS 0.0 03/03/2021 1137   CMP     Component Value Date/Time   NA  139 03/03/2021 1137   NA 136 08/30/2013 1915   K 4.6 03/03/2021 1137   K 3.7 08/30/2013 1915   CL 105  03/03/2021 1137   CL 105 08/30/2013 1915   CO2 20 03/03/2021 1137   CO2 27 08/30/2013 1915   GLUCOSE 100 (H) 03/03/2021 1137   GLUCOSE 95 01/14/2017 1058   GLUCOSE 89 08/30/2013 1915   BUN 12 03/03/2021 1137   BUN 14 08/30/2013 1915   CREATININE 0.78 03/03/2021 1137   CREATININE 0.98 08/30/2013 1915   CALCIUM 9.2 03/03/2021 1137   CALCIUM 9.1 08/30/2013 1915   PROT 6.7 03/03/2021 1137   PROT 7.5 08/30/2013 1915   ALBUMIN 4.8 03/03/2021 1137   ALBUMIN 4.2 08/30/2013 1915   AST 17 03/03/2021 1137   AST 14 (L) 08/30/2013 1915   ALT 12 03/03/2021 1137   ALT 21 08/30/2013 1915   ALKPHOS 77 03/03/2021 1137   ALKPHOS 53 08/30/2013 1915   BILITOT 0.5 03/03/2021 1137   BILITOT 0.5 08/30/2013 1915   GFRNONAA 53 (L) 03/18/2018 1602   GFRNONAA >60 08/30/2013 1915   GFRAA 61 03/18/2018 1602   GFRAA >60 08/30/2013 1915    Imaging Studies: CT OUTSIDE FILMS CHEST  Result Date: 03/31/2021 This examination belongs to an outside facility and is stored here for comparison purposes only.  Contact the originating outside institution for any associated report or interpretation. Upper Abdomen: Stable hepatic cysts.  Assessment and Plan:   Hannah Kaiser is a 60 y.o. y/o female has been referred for abdominal pain  Would recommend EGD for evaluation of new onset dysphagia, evaluation of chronic GERD, and left upper quadrant abdominal pain  Liver enzymes are otherwise reassuring  Given hepatic cysts seen on CT chest, will obtain right upper quadrant ultrasound.  These were previously seen on a CT abdomen in 2018 and reported as "scattered hepatic cysts".  I have discussed alternative options, risks & benefits,  which include, but are not limited to, bleeding, infection, perforation,respiratory complication & drug reaction.  The patient agrees with this plan & written consent will be obtained.    Patient educated extensively on acid reflux lifestyle modification, including using a bed wedge, not  eating 3 hrs before bedtime, diet modifications, and handout given for the same.   We discussed potentially decreasing her PPI after her EGD given that she has been on high-dose therapy for a year, if no indication to continue high-dose therapy after EGD.  Patient reports significant symptoms on once daily therapy, but is willing to try a lower dose after her EGD  She was encouraged to institute acid reflux lifestyle modifications as were discussed with her today, as this may help lower her PPI dose as well   Dr Hannah Kaiser  Speech recognition software was used to dictate the above note.

## 2021-04-01 NOTE — Addendum Note (Signed)
Addended by: Lurlean Nanny on: 04/01/2021 12:03 PM   Modules accepted: Orders, SmartSet

## 2021-04-07 ENCOUNTER — Encounter (INDEPENDENT_AMBULATORY_CARE_PROVIDER_SITE_OTHER): Payer: Self-pay | Admitting: Nurse Practitioner

## 2021-04-07 ENCOUNTER — Other Ambulatory Visit: Payer: Self-pay

## 2021-04-07 ENCOUNTER — Ambulatory Visit (INDEPENDENT_AMBULATORY_CARE_PROVIDER_SITE_OTHER): Payer: 59 | Admitting: Nurse Practitioner

## 2021-04-07 ENCOUNTER — Ambulatory Visit (INDEPENDENT_AMBULATORY_CARE_PROVIDER_SITE_OTHER): Payer: 59

## 2021-04-07 VITALS — BP 114/67 | HR 58 | Resp 16 | Wt 235.4 lb

## 2021-04-07 DIAGNOSIS — M79605 Pain in left leg: Secondary | ICD-10-CM

## 2021-04-07 DIAGNOSIS — M79604 Pain in right leg: Secondary | ICD-10-CM

## 2021-04-07 DIAGNOSIS — F17200 Nicotine dependence, unspecified, uncomplicated: Secondary | ICD-10-CM | POA: Diagnosis not present

## 2021-04-07 DIAGNOSIS — M7989 Other specified soft tissue disorders: Secondary | ICD-10-CM

## 2021-04-07 DIAGNOSIS — E782 Mixed hyperlipidemia: Secondary | ICD-10-CM

## 2021-04-07 NOTE — Progress Notes (Signed)
Subjective:    Patient ID: Hannah Kaiser, female    DOB: [birth year], 60 y.o.   MRN: [id number] Chief Complaint  Patient presents with  . Follow-up    Ultrasound follow up    Hannah Kaiser is a 8 year that presents today for follow-up evaluation regarding lower extremity edema as well as pain.  The patient has any restless leg syndrome that appears to be getting worse.  The patient has swelling in her right leg which is worse than the left.  She also has complaints of pain and tiredness in her legs that happen after short distances.  It is worse with incline.  The patient also has known lower back history with 3 previous back surgeries.  The patient's mother was a patient of ours due to her peripheral arterial disease.  She is also a smoker.  There are no open wounds or ulcerations with no previous history of DVT or superficial thrombophlebitis  Today noninvasive studies show no evidence of DVT or superficial thrombophlebitis bilaterally.  No evidence of superficial venous reflux bilaterally.  No evidence of deep venous insufficiency bilaterally.  Today noninvasive studies show an ABI of 1.11 on the right and 1.09 on the left.  There is a TBI 0.94 on the right and 0.2 on the left.  Patient has triphasic tibial artery waveforms with good toe waveforms bilaterally.    Review of Systems  Cardiovascular:  Positive for leg swelling.  Musculoskeletal:        Leg cramps  All other systems reviewed and are negative.     Objective:   Physical Exam Vitals reviewed.  HENT:     Head: Normocephalic.  Cardiovascular:     Rate and Rhythm: Normal rate.     Pulses: Normal pulses.  Pulmonary:     Effort: Pulmonary effort is normal.  Musculoskeletal:     Right lower leg: Edema present.     Left lower leg: Edema present.  Skin:    General: Skin is warm and dry.  Neurological:     Mental Status: She is alert and oriented to person, place, and time.  Psychiatric:        Mood and Affect: Mood normal.         Behavior: Behavior normal.        Thought Content: Thought content normal.        Judgment: Judgment normal.    BP 114/67 (BP Location: Right Arm)   Pulse (!) 58   Resp 16   Wt 235 lb 6.4 oz (106.8 kg)   BMI 40.41 kg/m   Past Medical History:  Diagnosis Date  . Anxiety   . Arthritis    knees, thoracic spurs and arthritis  . Asthma   . Breast pain   . CHF (congestive heart failure) ([place])   . Colon polyps   . COPD (chronic obstructive pulmonary disease) ([place])   . Depression   . Dysrhythmia   . GERD (gastroesophageal reflux disease)   . Glaucoma   . Heart murmur   . History of blood transfusion   . Hypertension   . Motion sickness    cars  . Multilevel degenerative disc disease   . Neuropathy    Bilateral arms and legs  . Nipple discharge   . Numbness of both lower extremities    bulging lumbar discs - per pt  . Numbness of upper extremity    bilateral - degenerative cervical discs - per pt  . Pneumonia   .  PONV (postoperative nausea and vomiting)   . Sleep apnea    has CPAP  . Smokers' cough ([place])   . Wears dentures    full upper and lower    Social History   Socioeconomic History  . Marital status: Legally Separated    Spouse name: Not on file  . Number of children: Not on file  . Years of education: Not on file  . Highest education level: Not on file  Occupational History  . Not on file  Tobacco Use  . Smoking status: Every Day    Packs/day: 1.50    Years: 41.00    Pack years: 61.50    Types: Cigarettes  . Smokeless tobacco: Never  . Tobacco comments:    since age 88  Vaping Use  . Vaping Use: Never used  Substance and Sexual Activity  . Alcohol use: No    Alcohol/week: 0.0 standard drinks  . Drug use: No  . Sexual activity: Not Currently    Birth control/protection: Surgical  Other Topics Concern  . Not on file  Social History Narrative  . Not on file   Social Determinants of Health   Financial Resource Strain: Not on file   Food Insecurity: Not on file  Transportation Needs: Not on file  Physical Activity: Not on file  Stress: Not on file  Social Connections: Not on file  Intimate Partner Violence: Not on file    Past Surgical History:  Procedure Laterality Date  . BACK SURGERY    . BELPHAROPTOSIS REPAIR    . BROW LIFT Bilateral 03/24/2016   Procedure: BLEPHAROPLASTY;  Surgeon: Karle Starch, MD;  Location: [facility];  Service: Ophthalmology;  Laterality: Bilateral;  sleep apnea  . CARDIAC CATHETERIZATION    . COLONOSCOPY WITH PROPOFOL N/A 10/19/2016   Procedure: COLONOSCOPY WITH PROPOFOL;  Surgeon: Lucilla Lame, MD;  Location: [facility];  Service: Endoscopy;  Laterality: N/A;  . DILATION AND CURETTAGE OF UTERUS    . ECTOPIC PREGNANCY SURGERY     x2  . ESOPHAGOGASTRODUODENOSCOPY (EGD) WITH PROPOFOL N/A 10/19/2016   Procedure: ESOPHAGOGASTRODUODENOSCOPY (EGD) WITH PROPOFOL;  Surgeon: Lucilla Lame, MD;  Location: [facility];  Service: Endoscopy;  Laterality: N/A;  . LEFT HEART CATH AND CORONARY ANGIOGRAPHY Left 12/29/2016   Procedure: Left Heart Cath and Coronary Angiography;  Surgeon: Dionisio David, MD;  Location: [facility];  Service: Cardiovascular;  Laterality: Left;  . NECK SURGERY  2003  . POLYPECTOMY  10/19/2016   Procedure: POLYPECTOMY;  Surgeon: Lucilla Lame, MD;  Location: [facility];  Service: Endoscopy;;  . [place]  . TOTAL ABDOMINAL HYSTERECTOMY W/ BILATERAL SALPINGOOPHORECTOMY  2014  . TUBAL LIGATION      Family History  Problem Relation Age of Onset  . Cancer Brother        melanoma  . Cancer Maternal Grandfather        prostate  . Cancer Maternal Uncle        lung    Allergies  Allergen Reactions  . Abilify [Aripiprazole] Swelling    Whole  body swelling, retains fluid  . Ace Inhibitors Cough    So bad she vomited Other reaction(s): Cough So bad she vomited Other reaction(s): Cough So bad she vomited    . Wellbutrin [Bupropion] Other (See Comments)    Worsened depression  . Paxlovid [Nirmatrelvir-Ritonavir] Other (See Comments)    Ulcers in mouth  . Azithromycin Rash  . Erythromycin Rash  CBC Latest Ref Rng & Units 03/03/2021 08/30/2020 03/18/2018  WBC 3.4 - 10.8 x10E3/uL 6.2 5.7 5.3  Hemoglobin 11.1 - 15.9 g/dL 13.0 12.8 11.1  Hematocrit 34.0 - 46.6 % 38.6 39.7 34.5  Platelets 150 - 450 x10E3/uL 222 217 273      CMP     Component Value Date/Time   NA 139 03/03/2021 1137   NA 136 08/30/2013 1915   K 4.6 03/03/2021 1137   K 3.7 08/30/2013 1915   CL 105 03/03/2021 1137   CL 105 08/30/2013 1915   CO2 20 03/03/2021 1137   CO2 27 08/30/2013 1915   GLUCOSE 100 (H) 03/03/2021 1137   GLUCOSE 95 01/14/2017 1058   GLUCOSE 89 08/30/2013 1915   BUN 12 03/03/2021 1137   BUN 14 08/30/2013 1915   CREATININE 0.78 03/03/2021 1137   CREATININE 0.98 08/30/2013 1915   CALCIUM 9.2 03/03/2021 1137   CALCIUM 9.1 08/30/2013 1915   PROT 6.7 03/03/2021 1137   PROT 7.5 08/30/2013 1915   ALBUMIN 4.8 03/03/2021 1137   ALBUMIN 4.2 08/30/2013 1915   AST 17 03/03/2021 1137   AST 14 (L) 08/30/2013 1915   ALT 12 03/03/2021 1137   ALT 21 08/30/2013 1915   ALKPHOS 77 03/03/2021 1137   ALKPHOS 53 08/30/2013 1915   BILITOT 0.5 03/03/2021 1137   BILITOT 0.5 08/30/2013 1915   GFRNONAA 53 (L) 03/18/2018 1602   GFRNONAA >60 08/30/2013 1915   GFRAA 61 03/18/2018 1602   GFRAA >60 08/30/2013 1915     No results found.     Assessment & Plan:   1. Swelling of limb No surgery or intervention at this point in time.   Patient will contnue wearing graduated compression stockings on a daily basis. The patient will put the stockings on first thing in the morning and removing them in the evening. The patient is reminded not to sleep in the stockings.  In addition, behavioral modification including elevation during the day will be initiated. Exercise is strongly encouraged.  The patient will follow  up with me PRN should anything change.  The patient voices agreement with this plan.   2. Pain in both lower extremities Recommend:  I do not find evidence of Vascular pathology that would explain the patient's symptoms  The patient has atypical pain symptoms for vascular disease  I do not find evidence of Vascular pathology that would explain the patient's symptoms and I suspect the patient is c/o pseudoclaudication.  Patient should have an evaluation of his LS spine which I defer to the primary service.  Noninvasive studies including venous ultrasound of the legs do not identify vascular problems  The patient should continue walking and begin a more formal exercise program. The patient should continue his antiplatelet therapy and aggressive treatment of the lipid abnormalities. The patient should begin wearing graduated compression socks 20-30 mmHg strength to control her mild edema.  Patient will follow-up with me on a PRN basis  Further work-up of her lower extremity pain is deferred to the primary service      3. Mixed hyperlipidemia Continue statin as ordered and reviewed, no changes at this time   4. Tobacco use disorder Coupled with the patient's family history of peripheral arterial disease this places the patient at greater risk for PAD.  Currently her noninvasive studies are normal but if she develops new or worsening symptoms she should be reevaluated.   Current Outpatient Medications on File Prior to Visit  Medication Sig Dispense Refill  .  amLODipine (NORVASC) 10 MG tablet Take 1 tablet (10 mg total) by mouth daily. 90 tablet 1  . atenolol (TENORMIN) 50 MG tablet Take 1 tablet (50 mg total) by mouth daily. 90 tablet 1  . busPIRone (BUSPAR) 15 MG tablet Take 1 tablet (15 mg total) by mouth 3 (three) times daily. 270 tablet 1  . clonazePAM (KLONOPIN) 1 MG tablet Take 1 tablet (1 mg total) by mouth 2 (two) times daily as needed. for anxiety 30 tablet 0  . DULoxetine  (CYMBALTA) 60 MG capsule Take 1 capsule (60 mg total) by mouth daily. 30 capsule 3  . ezetimibe (ZETIA) 10 MG tablet Take 1 tablet (10 mg total) by mouth daily. 90 tablet 1  . furosemide (LASIX) 20 MG tablet TAKE 1 TABLET BY MOUTH ONCE DAILY AS NEEDED FOR SWELLING 60 tablet 0  . hydrOXYzine (ATARAX/VISTARIL) 25 MG tablet Take 1 tablet (25 mg total) by mouth 3 (three) times daily as needed. 90 tablet 1  . ibuprofen (ADVIL) 800 MG tablet Take 1 tablet (800 mg total) by mouth every 8 (eight) hours as needed. 270 tablet 1  . isosorbide mononitrate (IMDUR) 30 MG 24 hr tablet Take 1 tablet (30 mg total) by mouth daily. 90 tablet 1  . latanoprost (XALATAN) 0.005 % ophthalmic solution Place 1 drop into both eyes at bedtime.    Marland Kitchen losartan (COZAAR) 100 MG tablet Take 1 tablet (100 mg total) by mouth daily. 90 tablet 1  . naproxen (NAPROSYN) 500 MG tablet Take 1 tablet (500 mg total) by mouth 2 (two) times daily with a meal. 30 tablet 0  . nystatin-triamcinolone ointment (MYCOLOG) Apply 1 application topically 2 (two) times daily. 30 g 1  . pantoprazole (PROTONIX) 40 MG tablet Take 1 tablet (40 mg total) by mouth 2 (two) times daily. 180 tablet 1  . QUEtiapine (SEROQUEL) 25 MG tablet Take 1 tablet (25 mg total) by mouth at bedtime. 90 tablet 1  . rosuvastatin (CRESTOR) 5 MG tablet Take 1 tablet (5 mg total) by mouth every other day. 90 tablet 1  . triamcinolone ointment (KENALOG) 0.5 % Apply 1 application topically 2 (two) times daily. 30 g 0  . valACYclovir (VALTREX) 1000 MG tablet Take 1 tablet (1,000 mg total) by mouth daily. 60 tablet 0  . albuterol (VENTOLIN HFA) 108 (90 Base) MCG/ACT inhaler Inhale 2 puffs into the lungs every 6 (six) hours as needed for wheezing or shortness of breath. (Patient not taking: No sig reported) 18 g 6  . budesonide-formoterol (SYMBICORT) 80-4.5 MCG/ACT inhaler Inhale 2 puffs into the lungs 2 (two) times daily. (Patient not taking: No sig reported) 1 each 3   No current  facility-administered medications on file prior to visit.    There are no Patient Instructions on file for this visit. No follow-ups on file.   Kris Hartmann, NP

## 2021-04-09 ENCOUNTER — Ambulatory Visit (INDEPENDENT_AMBULATORY_CARE_PROVIDER_SITE_OTHER): Payer: 59 | Admitting: Nurse Practitioner

## 2021-04-09 ENCOUNTER — Encounter: Payer: Self-pay | Admitting: Nurse Practitioner

## 2021-04-09 ENCOUNTER — Ambulatory Visit
Admission: RE | Admit: 2021-04-09 | Discharge: 2021-04-09 | Disposition: A | Discharge status: Home / Self Care | Payer: 59 | Attending: Nurse Practitioner | Admitting: Nurse Practitioner

## 2021-04-09 ENCOUNTER — Ambulatory Visit: Payer: Self-pay

## 2021-04-09 ENCOUNTER — Ambulatory Visit
Admission: RE | Admit: 2021-04-09 | Discharge: 2021-04-09 | Disposition: A | Discharge status: Home / Self Care | Payer: 59 | Source: Ambulatory Visit | Attending: Nurse Practitioner | Admitting: Nurse Practitioner

## 2021-04-09 ENCOUNTER — Other Ambulatory Visit: Payer: Self-pay

## 2021-04-09 VITALS — BP 132/84 | HR 64 | Temp 98.9°F | Wt 231.4 lb

## 2021-04-09 DIAGNOSIS — M25562 Pain in left knee: Secondary | ICD-10-CM

## 2021-04-09 MED ORDER — HYDROCODONE-ACETAMINOPHEN 5-325 MG PO TABS
1.0000 | ORAL_TABLET | Freq: Four times a day (QID) | ORAL | 0 refills | Status: AC | PRN
  Filled 2021-04-09: qty 20, 5d supply, fill #0

## 2021-04-09 MED ORDER — KETOROLAC TROMETHAMINE 60 MG/2ML IM SOLN
60.0000 mg | Freq: Once | INTRAMUSCULAR | Status: AC
  Administered 2021-04-09: 60 mg via INTRAMUSCULAR

## 2021-04-09 NOTE — Patient Instructions (Signed)
Acute Knee Pain, Adult °Many things can cause knee pain. Sometimes, knee pain is sudden (acute) and may be caused by damage, swelling, or irritation of the muscles and tissues that support your knee. °The pain often goes away on its own with time and rest. If the pain does not go away, tests may be done to find out what is causing the pain. °Follow these instructions at home: °If you have a knee sleeve or brace: ° °Wear the knee sleeve or brace as told by your doctor. Take it off only as told by your doctor. °Loosen it if your toes: °Tingle. °Become numb. °Turn cold and blue. °Keep it clean. °If the knee sleeve or brace is not waterproof: °Do not let it get wet. °Cover it with a watertight covering when you take a bath or shower. °Activity °Rest your knee. °Do not do things that cause pain or make pain worse. °Avoid activities where both feet leave the ground at the same time (high-impact activities). Examples are running, jumping rope, and doing jumping jacks. °Work with a physical therapist to make a safe exercise program, as told by your doctor. °Managing pain, stiffness, and swelling ° °If told, put ice on the knee. To do this: °If you have a removable knee sleeve or brace, take it off as told by your doctor. °Put ice in a plastic bag. °Place a towel between your skin and the bag. °Leave the ice on for 20 minutes, 2-3 times a day. °Take off the ice if your skin turns bright red. This is very important. If you cannot feel pain, heat, or cold, you have a greater risk of damage to the area. °If told, use an elastic bandage to put pressure (compression) on your injured knee. °Raise your knee above the level of your heart while you are sitting or lying down. °Sleep with a pillow under your knee. °General instructions °Take over-the-counter and prescription medicines only as told by your doctor. °Do not smoke or use any products that contain nicotine or tobacco. If you need help quitting, ask your doctor. °If you are  overweight, work with your doctor and a food expert (dietitian) to set goals to lose weight. Being overweight can make your knee hurt more. °Watch for any changes in your symptoms. °Keep all follow-up visits. °Contact a doctor if: °The knee pain does not stop. °The knee pain changes or gets worse. °You have a fever along with knee pain. °Your knee is red or feels warm when you touch it. °Your knee gives out or locks up. °Get help right away if: °Your knee swells, and the swelling gets worse. °You cannot move your knee. °You have very bad knee pain that does not get better with pain medicine. °Summary °Many things can cause knee pain. The pain often goes away on its own with time and rest. °Your doctor may do tests to find out the cause of the pain. °Watch for any changes in your symptoms. Relieve your pain with rest, medicines, light activity, and use of ice. °Get help right away if you cannot move your knee or your knee pain is very bad. °This information is not intended to replace advice given to you by your health care provider. Make sure you discuss any questions you have with your health care provider. °Document Revised: 11/08/2019 Document Reviewed: 11/08/2019 °Elsevier Patient Education © 2022 [employer] ° °

## 2021-04-09 NOTE — Telephone Encounter (Signed)
Pt. Slipped and fell at home yesterday. Fell with left leg underneath her. Left knee is painful, hurts to bend knee. Appointment made for today.     Reason for Disposition  [1] MODERATE weakness (i.e., interferes with work, school, normal activities) AND [2] new-onset or worsening  Answer Assessment - Initial Assessment Questions 1. MECHANISM: "How did the fall happen?"     Slipped at home 2. DOMESTIC VIOLENCE AND ELDER ABUSE SCREENING: "Did you fall because someone pushed you or tried to hurt you?" If Yes, ask: "Are you safe now?"     No 3. ONSET: "When did the fall happen?" (e.g., minutes, hours, or days ago)     Yesterday 4. LOCATION: "What part of the body hit the ground?" (e.g., back, buttocks, head, hips, knees, hands, head, stomach)     Left knee 5. INJURY: "Did you hurt (injure) yourself when you fell?" If Yes, ask: "What did you injure? Tell me more about this?" (e.g., body area; type of injury; pain severity)"     Left knee 6. PAIN: "Is there any pain?" If Yes, ask: "How bad is the pain?" (e.g., Scale 1-10; or mild,  moderate, severe)   - NONE (0): No pain   - MILD (1-3): Doesn't interfere with normal activities    - MODERATE (4-7): Interferes with normal activities or awakens from sleep    - SEVERE (8-10): Excruciating pain, unable to do any normal activities      Now - 8 7. SIZE: For cuts, bruises, or swelling, ask: "How large is it?" (e.g., inches or centimeters)      None 8. PREGNANCY: "Is there any chance you are pregnant?" "When was your last menstrual period?"     No 9. OTHER SYMPTOMS: "Do you have any other symptoms?" (e.g., dizziness, fever, weakness; new onset or worsening).      No 10. CAUSE: "What do you think caused the fall (or falling)?" (e.g., tripped, dizzy spell)       Slipped on banana peel  Protocols used: Falls and [facility]

## 2021-04-09 NOTE — Assessment & Plan Note (Signed)
Post fall 24 hours ago with twisting of knee, significant decrease in ROM noted on exam.  Will provide her a short burst (5 days) of Norco for discomfort, aware not to take this at same time she takes her Klonopin and to use of severe pain episodes only.  May take OTC Tylenol for discomfort + utilize Voltaren gel or Icy Hot.  Apply ice to knee every 2 hours for 20 minutes.  Will obtaining imaging of knee and urgent referral to ortho due to concern for tear.  Toradol in office today.  Return in 2 weeks.

## 2021-04-09 NOTE — Progress Notes (Signed)
BP 132/84   Pulse 64   Temp 98.9 F (37.2 C) (Oral)   Wt 231 lb 6.4 oz (105 kg)   SpO2 99%   BMI 39.72 kg/m    Subjective:    Patient ID: Hannah Kaiser, female    DOB: Jun 17, 1960, 60 y.o.   MRN: [id number]  HPI: Hannah Kaiser is a 60 y.o. female  Chief Complaint  Patient presents with   Fall    Patient states she fell yesterday and thinks she may have tripped on a Banana. Patient states she took Ibuprofen and old prescription of muscle relaxer and states it helped her a little bit. Patient states she cannot tell if it is swollen or not.    KNEE PAIN (LEFT) Fell yesterday -- nephew dropped banana and she slipped on it.  When she fell her left knee twisted to side a little with her left hip.  Now can barely move knee. Duration: days Involved knee: left Mechanism of injury: trauma Location:diffuse Onset: sudden Severity: 8/10  Quality:  sharp, aching, and throbbing Frequency: intermittent Radiation: no Aggravating factors: weight bearing, walking, bending, and movement  Alleviating factors:  muscle relaxer, NSAIDs, and rest  Status: stable Treatments attempted: rest and ibuprofen  Relief with NSAIDs?:  moderate Weakness with weight bearing or walking: yes Sensation of giving way: yes Locking: no Popping: no Bruising: no Swelling: no Redness: no Paresthesias/decreased sensation: no Fevers: no   Relevant past medical, surgical, family and social history reviewed and updated as indicated. Interim medical history since our last visit reviewed. Allergies and medications reviewed and updated.  Review of Systems  Constitutional:  Negative for activity change, appetite change, diaphoresis, fatigue and fever.  Respiratory:  Negative for cough, chest tightness and shortness of breath.   Cardiovascular:  Negative for chest pain, palpitations and leg swelling.  Musculoskeletal:  Positive for arthralgias.  Neurological: Negative.   Psychiatric/Behavioral: Negative.     Per  HPI unless specifically indicated above     Objective:    BP 132/84   Pulse 64   Temp 98.9 F (37.2 C) (Oral)   Wt 231 lb 6.4 oz (105 kg)   SpO2 99%   BMI 39.72 kg/m   Wt Readings from Last 3 Encounters:  04/09/21 231 lb 6.4 oz (105 kg)  04/07/21 235 lb 6.4 oz (106.8 kg)  04/01/21 238 lb (108 kg)    Physical Exam Vitals and nursing note reviewed.  Constitutional:      General: She is awake. She is not in acute distress.    Appearance: She is well-developed and well-groomed. She is obese. She is not ill-appearing or toxic-appearing.  HENT:     Head: Normocephalic.     Right Ear: Hearing normal.     Left Ear: Hearing normal.  Eyes:     General: Lids are normal.        Right eye: No discharge.        Left eye: No discharge.     Conjunctiva/sclera: Conjunctivae normal.     Pupils: Pupils are equal, round, and reactive to light.  Neck:     Thyroid: No thyromegaly.     Vascular: No carotid bruit.  Cardiovascular:     Rate and Rhythm: Normal rate and regular rhythm.     Heart sounds: Normal heart sounds. No murmur heard.   No gallop.  Pulmonary:     Effort: Pulmonary effort is normal. No accessory muscle usage or respiratory distress.  Breath sounds: Normal breath sounds.  Abdominal:     General: Bowel sounds are normal.     Palpations: Abdomen is soft. There is no hepatomegaly or splenomegaly.  Musculoskeletal:     Cervical back: Normal range of motion and neck supple.     Right knee: Normal.     Left knee: No swelling, erythema, ecchymosis, lacerations, bony tenderness or crepitus. Decreased range of motion. Tenderness present over the medial joint line and lateral joint line.     Right lower leg: No edema.     Left lower leg: No edema.     Comments: Unable to perform McMurry or drawer testing left knee as ROM moderately decreased, only able to bend slightly for flexion and extension.  Antalgic gait, needed assist to get up on table.  Skin:    General: Skin is warm  and dry.  Neurological:     Mental Status: She is alert and oriented to person, place, and time.  Psychiatric:        Attention and Perception: Attention normal.        Mood and Affect: Mood normal.        Speech: Speech normal.        Behavior: Behavior normal. Behavior is cooperative.        Thought Content: Thought content normal.    Results for orders placed or performed in visit on 03/03/21  Microscopic Examination   Urine  Result Value Ref Range   WBC, UA None seen 0 - 5 /hpf   RBC 0-2 0 - 2 /hpf   Epithelial Cells (non renal) 0-10 0 - 10 /hpf   Bacteria, UA Few (A) None seen/Few   Yeast, UA Present (A) None seen  Bayer DCA Hb A1c Waived  Result Value Ref Range   HB A1C (BAYER DCA - WAIVED) 5.4 4.8 - 5.6 %  CBC with Differential/Platelet  Result Value Ref Range   WBC 6.2 3.4 - 10.8 x10E3/uL   RBC 4.46 3.77 - 5.28 x10E6/uL   Hemoglobin 13.0 11.1 - 15.9 g/dL   Hematocrit 38.6 34.0 - 46.6 %   MCV 87 79 - 97 fL   MCH 29.1 26.6 - 33.0 pg   MCHC 33.7 31.5 - 35.7 g/dL   RDW 13.1 11.7 - 15.4 %   Platelets 222 150 - 450 x10E3/uL   Neutrophils 62 Not Estab. %   Lymphs 29 Not Estab. %   Monocytes 6 Not Estab. %   Eos 2 Not Estab. %   Basos 1 Not Estab. %   Neutrophils Absolute 3.9 1.4 - 7.0 x10E3/uL   Lymphocytes Absolute 1.8 0.7 - 3.1 x10E3/uL   Monocytes Absolute 0.4 0.1 - 0.9 x10E3/uL   EOS (ABSOLUTE) 0.1 0.0 - 0.4 x10E3/uL   Basophils Absolute 0.0 0.0 - 0.2 x10E3/uL   Immature Granulocytes 0 Not Estab. %   Immature Grans (Abs) 0.0 0.0 - 0.1 x10E3/uL  Comprehensive metabolic panel  Result Value Ref Range   Glucose 100 (H) 70 - 99 mg/dL   BUN 12 6 - 24 mg/dL   Creatinine, Ser 0.78 0.57 - 1.00 mg/dL   eGFR 88 >59 mL/min/1.73   BUN/Creatinine Ratio 15 9 - 23   Sodium 139 134 - 144 mmol/L   Potassium 4.6 3.5 - 5.2 mmol/L   Chloride 105 96 - 106 mmol/L   CO2 20 20 - 29 mmol/L   Calcium 9.2 8.7 - 10.2 mg/dL   Total Protein 6.7 6.0 - 8.5 g/dL  Albumin 4.8 3.8 - 4.9  g/dL   Globulin, Total 1.9 1.5 - 4.5 g/dL   Albumin/Globulin Ratio 2.5 (H) 1.2 - 2.2   Bilirubin Total 0.5 0.0 - 1.2 mg/dL   Alkaline Phosphatase 77 44 - 121 IU/L   AST 17 0 - 40 IU/L   ALT 12 0 - 32 IU/L  TSH  Result Value Ref Range   TSH 0.878 0.450 - 4.500 uIU/mL  Urinalysis, Routine w reflex microscopic  Result Value Ref Range   Specific Gravity, UA 1.010 1.005 - 1.030   pH, UA 6.5 5.0 - 7.5   Color, UA Yellow Yellow   Appearance Ur Clear Clear   Leukocytes,UA Negative Negative   Protein,UA Negative Negative/Trace   Glucose, UA Negative Negative   Ketones, UA Negative Negative   RBC, UA Trace (A) Negative   Bilirubin, UA Negative Negative   Urobilinogen, Ur 0.2 0.2 - 1.0 mg/dL   Nitrite, UA Negative Negative   Microscopic Examination See below:   Microalbumin, Urine Waived  Result Value Ref Range   Microalb, Ur Waived 10 0 - 19 mg/L   Creatinine, Urine Waived 100 10 - 300 mg/dL   Microalb/Creat Ratio <30 <30 mg/g  Lipid Panel w/o Chol/HDL Ratio  Result Value Ref Range   Cholesterol, Total 174 100 - 199 mg/dL   Triglycerides 140 0 - 149 mg/dL   HDL 47 >39 mg/dL   VLDL Cholesterol Cal 25 5 - 40 mg/dL   LDL Chol Calc (NIH) 102 (H) 0 - 99 mg/dL      Assessment & Plan:   Problem List Items Addressed This Visit       Other   Acute pain of left knee - Primary    Post fall 24 hours ago with twisting of knee, significant decrease in ROM noted on exam.  Will provide her a short burst (5 days) of Norco for discomfort, aware not to take this at same time she takes her Klonopin and to use of severe pain episodes only.  May take OTC Tylenol for discomfort + utilize Voltaren gel or Icy Hot.  Apply ice to knee every 2 hours for 20 minutes.  Will obtaining imaging of knee and urgent referral to ortho due to concern for tear.  Toradol in office today.  Return in 2 weeks.      Relevant Medications   ketorolac (TORADOL) injection 60 mg   Other Relevant Orders   Ambulatory referral  to Orthopedics   DG Knee Complete 4 Views Left     Follow up plan: Return in about 2 weeks (around 04/23/2021) for Left knee pain.

## 2021-04-10 NOTE — Progress Notes (Signed)
Contacted via MyChart   Good evening Hannah Kaiser, your imaging has returned.  There is some moderate degree of osteoarthritis noted and a possible effusion, some fluid, in the knee.  This is similar on review of report and images themself to previous imaging.  I do recommend you see ortho still for further evaluation, especially if ongoing pain.  Any questions? Keep being awesome!!  Thank you for allowing me to participate in your care.  I appreciate you. Kindest regards, Griffon Herberg

## 2021-04-21 ENCOUNTER — Ambulatory Visit
Admission: RE | Admit: 2021-04-21 | Discharge: 2021-04-21 | Disposition: A | Discharge status: Home / Self Care | Payer: 59 | Source: Ambulatory Visit | Attending: Gastroenterology | Admitting: Gastroenterology

## 2021-04-21 ENCOUNTER — Other Ambulatory Visit: Payer: Self-pay

## 2021-04-21 DIAGNOSIS — K7689 Other specified diseases of liver: Secondary | ICD-10-CM | POA: Insufficient documentation

## 2021-04-28 ENCOUNTER — Encounter: Payer: Self-pay | Admitting: Family Medicine

## 2021-04-28 ENCOUNTER — Ambulatory Visit (INDEPENDENT_AMBULATORY_CARE_PROVIDER_SITE_OTHER): Payer: 59 | Admitting: Family Medicine

## 2021-04-28 ENCOUNTER — Other Ambulatory Visit: Payer: Self-pay

## 2021-04-28 ENCOUNTER — Telehealth: Payer: Self-pay | Admitting: Gastroenterology

## 2021-04-28 VITALS — BP 146/87 | HR 73 | Temp 98.0°F | Wt 234.8 lb

## 2021-04-28 DIAGNOSIS — M170 Bilateral primary osteoarthritis of knee: Secondary | ICD-10-CM | POA: Diagnosis not present

## 2021-04-28 DIAGNOSIS — G4486 Cervicogenic headache: Secondary | ICD-10-CM | POA: Diagnosis not present

## 2021-04-28 NOTE — Assessment & Plan Note (Signed)
Steroid injection done to L knee. Call with any concerns.

## 2021-04-28 NOTE — Telephone Encounter (Signed)
Patient wants to cancel procedure(has been exposed to covid). Clinical staff will follow up with patient.

## 2021-04-28 NOTE — Telephone Encounter (Signed)
I spoke to pt and she stated that she is having Sx--headache and stuffy nose... Pt has been rescheduled to Dec 20, Endo called and asked Hannah Kaiser to move procedure to new date

## 2021-04-28 NOTE — Progress Notes (Signed)
BP (!) 146/87   Pulse 73   Temp 98 F (36.7 C)   Wt 234 lb 12.8 oz (106.5 kg)   SpO2 96%   BMI 40.30 kg/m    Subjective:    Patient ID: Hannah Kaiser, female    DOB: 05-11-1961, 60 y.o.   MRN: [id number]  HPI: Hannah Kaiser is a 60 y.o. female  Chief Complaint  Patient presents with   Knee Pain    Follow up left knee pain    Headache    Patient states she has been having headaches recently   KNEE PAIN Duration: chronic, but much worse in the past few of weeks Involved knee: left Mechanism of injury: trauma Location:diffuse Onset: sudden Severity: 8/10  Quality:  aching, sharp and throbbing Frequency: intermittent Radiation: no Aggravating factors: weight bearing, walking, bending, and movement  Alleviating factors:  muscle relaxer, ice, and NSAIDs  Status: stable Treatments attempted:  muscle relaxer, rest, ice, APAP, ibuprofen, and aleve  Relief with NSAIDs?:  moderate Weakness with weight bearing or walking: yes Sensation of giving way: yes Locking: no Popping: no Bruising: no Swelling: no Redness: no Paresthesias/decreased sensation: no Fevers: no  Has been having more headaches. Has been under a lot of stress  Relevant past medical, surgical, family and social history reviewed and updated as indicated. Interim medical history since our last visit reviewed. Allergies and medications reviewed and updated.  Review of Systems  Constitutional: Negative.   Respiratory: Negative.    Cardiovascular: Negative.   Musculoskeletal:  Positive for myalgias, neck pain and neck stiffness. Negative for arthralgias, back pain, gait problem and joint swelling.  Skin: Negative.   Neurological:  Positive for headaches. Negative for dizziness, tremors, seizures, syncope, facial asymmetry, speech difficulty, weakness, light-headedness and numbness.  Psychiatric/Behavioral: Negative.     Per HPI unless specifically indicated above     Objective:    BP (!) 146/87   Pulse  73   Temp 98 F (36.7 C)   Wt 234 lb 12.8 oz (106.5 kg)   SpO2 96%   BMI 40.30 kg/m   Wt Readings from Last 3 Encounters:  04/28/21 234 lb 12.8 oz (106.5 kg)  04/09/21 231 lb 6.4 oz (105 kg)  04/07/21 235 lb 6.4 oz (106.8 kg)    Physical Exam Vitals and nursing note reviewed.  Constitutional:      General: She is not in acute distress.    Appearance: Normal appearance. She is not ill-appearing, toxic-appearing or diaphoretic.  HENT:     Head: Normocephalic and atraumatic.     Right Ear: External ear normal.     Left Ear: External ear normal.     Nose: Nose normal.     Mouth/Throat:     Mouth: Mucous membranes are moist.     Pharynx: Oropharynx is clear.  Eyes:     General: No scleral icterus.       Right eye: No discharge.        Left eye: No discharge.     Extraocular Movements: Extraocular movements intact.     Conjunctiva/sclera: Conjunctivae normal.     Pupils: Pupils are equal, round, and reactive to light.  Cardiovascular:     Rate and Rhythm: Normal rate and regular rhythm.     Pulses: Normal pulses.     Heart sounds: Normal heart sounds. No murmur heard.   No friction rub. No gallop.  Pulmonary:     Effort: Pulmonary effort is normal. No respiratory distress.  Breath sounds: Normal breath sounds. No stridor. No wheezing, rhonchi or rales.  Chest:     Chest wall: No tenderness.  Musculoskeletal:        General: Normal range of motion.     Cervical back: Normal range of motion and neck supple.  Skin:    General: Skin is warm and dry.     Capillary Refill: Capillary refill takes less than 2 seconds.     Coloration: Skin is not jaundiced or pale.     Findings: No bruising, erythema, lesion or rash.  Neurological:     General: No focal deficit present.     Mental Status: She is alert and oriented to person, place, and time. Mental status is at baseline.  Psychiatric:        Mood and Affect: Mood normal.        Behavior: Behavior normal.        Thought  Content: Thought content normal.        Judgment: Judgment normal.    Results for orders placed or performed in visit on 03/03/21  Microscopic Examination   Urine  Result Value Ref Range   WBC, UA None seen 0 - 5 /hpf   RBC 0-2 0 - 2 /hpf   Epithelial Cells (non renal) 0-10 0 - 10 /hpf   Bacteria, UA Few (A) None seen/Few   Yeast, UA Present (A) None seen  Bayer DCA Hb A1c Waived  Result Value Ref Range   HB A1C (BAYER DCA - WAIVED) 5.4 4.8 - 5.6 %  CBC with Differential/Platelet  Result Value Ref Range   WBC 6.2 3.4 - 10.8 x10E3/uL   RBC 4.46 3.77 - 5.28 x10E6/uL   Hemoglobin 13.0 11.1 - 15.9 g/dL   Hematocrit 38.6 34.0 - 46.6 %   MCV 87 79 - 97 fL   MCH 29.1 26.6 - 33.0 pg   MCHC 33.7 31.5 - 35.7 g/dL   RDW 13.1 11.7 - 15.4 %   Platelets 222 150 - 450 x10E3/uL   Neutrophils 62 Not Estab. %   Lymphs 29 Not Estab. %   Monocytes 6 Not Estab. %   Eos 2 Not Estab. %   Basos 1 Not Estab. %   Neutrophils Absolute 3.9 1.4 - 7.0 x10E3/uL   Lymphocytes Absolute 1.8 0.7 - 3.1 x10E3/uL   Monocytes Absolute 0.4 0.1 - 0.9 x10E3/uL   EOS (ABSOLUTE) 0.1 0.0 - 0.4 x10E3/uL   Basophils Absolute 0.0 0.0 - 0.2 x10E3/uL   Immature Granulocytes 0 Not Estab. %   Immature Grans (Abs) 0.0 0.0 - 0.1 x10E3/uL  Comprehensive metabolic panel  Result Value Ref Range   Glucose 100 (H) 70 - 99 mg/dL   BUN 12 6 - 24 mg/dL   Creatinine, Ser 0.78 0.57 - 1.00 mg/dL   eGFR 88 >59 mL/min/1.73   BUN/Creatinine Ratio 15 9 - 23   Sodium 139 134 - 144 mmol/L   Potassium 4.6 3.5 - 5.2 mmol/L   Chloride 105 96 - 106 mmol/L   CO2 20 20 - 29 mmol/L   Calcium 9.2 8.7 - 10.2 mg/dL   Total Protein 6.7 6.0 - 8.5 g/dL   Albumin 4.8 3.8 - 4.9 g/dL   Globulin, Total 1.9 1.5 - 4.5 g/dL   Albumin/Globulin Ratio 2.5 (H) 1.2 - 2.2   Bilirubin Total 0.5 0.0 - 1.2 mg/dL   Alkaline Phosphatase 77 44 - 121 IU/L   AST 17 0 - 40 IU/L   ALT 12  0 - 32 IU/L  TSH  Result Value Ref Range   TSH 0.878 0.450 - 4.500 uIU/mL   Urinalysis, Routine w reflex microscopic  Result Value Ref Range   Specific Gravity, UA 1.010 1.005 - 1.030   pH, UA 6.5 5.0 - 7.5   Color, UA Yellow Yellow   Appearance Ur Clear Clear   Leukocytes,UA Negative Negative   Protein,UA Negative Negative/Trace   Glucose, UA Negative Negative   Ketones, UA Negative Negative   RBC, UA Trace (A) Negative   Bilirubin, UA Negative Negative   Urobilinogen, Ur 0.2 0.2 - 1.0 mg/dL   Nitrite, UA Negative Negative   Microscopic Examination See below:   Microalbumin, Urine Waived  Result Value Ref Range   Microalb, Ur Waived 10 0 - 19 mg/L   Creatinine, Urine Waived 100 10 - 300 mg/dL   Microalb/Creat Ratio <30 <30 mg/g  Lipid Panel w/o Chol/HDL Ratio  Result Value Ref Range   Cholesterol, Total 174 100 - 199 mg/dL   Triglycerides 140 0 - 149 mg/dL   HDL 47 >39 mg/dL   VLDL Cholesterol Cal 25 5 - 40 mg/dL   LDL Chol Calc (NIH) 102 (H) 0 - 99 mg/dL      Assessment & Plan:   Problem List Items Addressed This Visit       Musculoskeletal and Integument   Primary osteoarthritis of both knees - Primary    Steroid injection done to L knee. Call with any concerns.       Other Visit Diagnoses     Cervicogenic headache       Will start neck stretches. Call with any concerns. Continue to monitor.        Procedure: Left  Knee Intraarticular Steroid Injection        Diagnosis:   ICD-10-CM   1. Primary osteoarthritis of both knees  M17.0     2. Cervicogenic headache  G44.86    Will start neck stretches. Call with any concerns. Continue to monitor.       Physician: MJ Consent:  Risks, benefits, and alternative treatments discussed and all questions were answered.  Patient elected to proceed and verbal consent obtained.  Description: Area prepped and draped using  semi-sterile technique.  Using a anterior/lateral approach, a mixture of 4 cc of  1% lidocaine & 1 cc of Kenalog 40 was injected into knee joint.  A bandage was then placed  over the injection site. Complications: none Post Procedure Instructions: Wound care instructions discussed and patient was instructed to keep area clean and dry.  Signs and symptoms of infection discussed, patient agrees to contact the office ASAP should they occur.  Follow Up: Return if symptoms worsen or fail to improve.  Follow up plan: Return if symptoms worsen or fail to improve.

## 2021-04-30 ENCOUNTER — Encounter: Admission: RE | Payer: Self-pay | Source: Home / Self Care

## 2021-04-30 ENCOUNTER — Ambulatory Visit: Admission: RE | Admit: 2021-04-30 | Payer: 59 | Source: Home / Self Care | Admitting: Gastroenterology

## 2021-04-30 SURGERY — ESOPHAGOGASTRODUODENOSCOPY (EGD) WITH PROPOFOL
Anesthesia: General

## 2021-05-20 ENCOUNTER — Encounter: Payer: Self-pay | Admitting: Family Medicine

## 2021-05-20 ENCOUNTER — Telehealth (INDEPENDENT_AMBULATORY_CARE_PROVIDER_SITE_OTHER): Payer: 59 | Admitting: Family Medicine

## 2021-05-20 DIAGNOSIS — Z20828 Contact with and (suspected) exposure to other viral communicable diseases: Secondary | ICD-10-CM

## 2021-05-20 DIAGNOSIS — J069 Acute upper respiratory infection, unspecified: Secondary | ICD-10-CM | POA: Diagnosis not present

## 2021-05-20 MED ORDER — OSELTAMIVIR PHOSPHATE 75 MG PO CAPS: 75.0000 mg | ORAL_CAPSULE | Freq: Every day | ORAL | 0 refills | Status: DC

## 2021-05-20 NOTE — Progress Notes (Signed)
There were no vitals taken for this visit.   Subjective:    Patient ID: Hannah Kaiser, female    DOB: [birth year], 60 y.o.   MRN: [id number]  HPI: Mandeep Ferch Brinton is a 60 y.o. female  Chief Complaint  Patient presents with   URI    Patient states she has a runny nose, cough, and headache.    UPPER RESPIRATORY TRACT INFECTION Duration: Last night Worst symptom: runny nose, cough, headache Fever: no Cough: yes Shortness of breath: no Wheezing: yes Chest pain: no Chest tightness: no Chest congestion: no Nasal congestion: yes Runny nose: yes Post nasal drip: yes Sneezing: yes Sore throat: no Swollen glands: no Sinus pressure: yes Headache: yes Face pain: no Toothache: no Ear pain: no  Ear pressure: no  Eyes red/itching:no Eye drainage/crusting: no  Vomiting: no Rash: no Fatigue: yes Sick contacts: yes Strep contacts: no  Context: stable Recurrent sinusitis: no Relief with OTC cold/cough medications: no  Treatments attempted: cold/sinus   Relevant past medical, surgical, family and social history reviewed and updated as indicated. Interim medical history since our last visit reviewed. Allergies and medications reviewed and updated.  Review of Systems  Constitutional:  Positive for chills, diaphoresis, fatigue and fever. Negative for activity change, appetite change and unexpected weight change.  HENT:  Positive for congestion, postnasal drip and sneezing. Negative for dental problem, drooling, ear discharge, ear pain, facial swelling, hearing loss, mouth sores, nosebleeds, rhinorrhea, sinus pressure, sinus pain, sore throat, tinnitus, trouble swallowing and voice change.   Eyes: Negative.   Respiratory:  Positive for cough. Negative for apnea, choking, chest tightness, shortness of breath, wheezing and stridor.   Cardiovascular: Negative.   Gastrointestinal: Negative.   Neurological: Negative.   Psychiatric/Behavioral: Negative.     Per HPI unless specifically  indicated above     Objective:    There were no vitals taken for this visit.  Wt Readings from Last 3 Encounters:  04/28/21 234 lb 12.8 oz (106.5 kg)  04/09/21 231 lb 6.4 oz (105 kg)  04/07/21 235 lb 6.4 oz (106.8 kg)    Physical Exam Vitals and nursing note reviewed.  Pulmonary:     Effort: Pulmonary effort is normal. No respiratory distress.     Comments: Speaking in full sentences Neurological:     Mental Status: She is alert.  Psychiatric:        Mood and Affect: Mood normal.        Behavior: Behavior normal.        Thought Content: Thought content normal.        Judgment: Judgment normal.    Results for orders placed or performed in visit on 03/03/21  Microscopic Examination   Urine  Result Value Ref Range   WBC, UA None seen 0 - 5 /hpf   RBC 0-2 0 - 2 /hpf   Epithelial Cells (non renal) 0-10 0 - 10 /hpf   Bacteria, UA Few (A) None seen/Few   Yeast, UA Present (A) None seen  Bayer DCA Hb A1c Waived  Result Value Ref Range   HB A1C (BAYER DCA - WAIVED) 5.4 4.8 - 5.6 %  CBC with Differential/Platelet  Result Value Ref Range   WBC 6.2 3.4 - 10.8 x10E3/uL   RBC 4.46 3.77 - 5.28 x10E6/uL   Hemoglobin 13.0 11.1 - 15.9 g/dL   Hematocrit 38.6 34.0 - 46.6 %   MCV 87 79 - 97 fL   MCH 29.1 26.6 - 33.0 pg  MCHC 33.7 31.5 - 35.7 g/dL   RDW 13.1 11.7 - 15.4 %   Platelets 222 150 - 450 x10E3/uL   Neutrophils 62 Not Estab. %   Lymphs 29 Not Estab. %   Monocytes 6 Not Estab. %   Eos 2 Not Estab. %   Basos 1 Not Estab. %   Neutrophils Absolute 3.9 1.4 - 7.0 x10E3/uL   Lymphocytes Absolute 1.8 0.7 - 3.1 x10E3/uL   Monocytes Absolute 0.4 0.1 - 0.9 x10E3/uL   EOS (ABSOLUTE) 0.1 0.0 - 0.4 x10E3/uL   Basophils Absolute 0.0 0.0 - 0.2 x10E3/uL   Immature Granulocytes 0 Not Estab. %   Immature Grans (Abs) 0.0 0.0 - 0.1 x10E3/uL  Comprehensive metabolic panel  Result Value Ref Range   Glucose 100 (H) 70 - 99 mg/dL   BUN 12 6 - 24 mg/dL   Creatinine, Ser 0.78 0.57 - 1.00  mg/dL   eGFR 88 >59 mL/min/1.73   BUN/Creatinine Ratio 15 9 - 23   Sodium 139 134 - 144 mmol/L   Potassium 4.6 3.5 - 5.2 mmol/L   Chloride 105 96 - 106 mmol/L   CO2 20 20 - 29 mmol/L   Calcium 9.2 8.7 - 10.2 mg/dL   Total Protein 6.7 6.0 - 8.5 g/dL   Albumin 4.8 3.8 - 4.9 g/dL   Globulin, Total 1.9 1.5 - 4.5 g/dL   Albumin/Globulin Ratio 2.5 (H) 1.2 - 2.2   Bilirubin Total 0.5 0.0 - 1.2 mg/dL   Alkaline Phosphatase 77 44 - 121 IU/L   AST 17 0 - 40 IU/L   ALT 12 0 - 32 IU/L  TSH  Result Value Ref Range   TSH 0.878 0.450 - 4.500 uIU/mL  Urinalysis, Routine w reflex microscopic  Result Value Ref Range   Specific Gravity, UA 1.010 1.005 - 1.030   pH, UA 6.5 5.0 - 7.5   Color, UA Yellow Yellow   Appearance Ur Clear Clear   Leukocytes,UA Negative Negative   Protein,UA Negative Negative/Trace   Glucose, UA Negative Negative   Ketones, UA Negative Negative   RBC, UA Trace (A) Negative   Bilirubin, UA Negative Negative   Urobilinogen, Ur 0.2 0.2 - 1.0 mg/dL   Nitrite, UA Negative Negative   Microscopic Examination See below:   Microalbumin, Urine Waived  Result Value Ref Range   Microalb, Ur Waived 10 0 - 19 mg/L   Creatinine, Urine Waived 100 10 - 300 mg/dL   Microalb/Creat Ratio <30 <30 mg/g  Lipid Panel w/o Chol/HDL Ratio  Result Value Ref Range   Cholesterol, Total 174 100 - 199 mg/dL   Triglycerides 140 0 - 149 mg/dL   HDL 47 >39 mg/dL   VLDL Cholesterol Cal 25 5 - 40 mg/dL   LDL Chol Calc (NIH) 102 (H) 0 - 99 mg/dL      Assessment & Plan:   Problem List Items Addressed This Visit   None Visit Diagnoses     Exposure to the flu    -  Primary   Will start tamiflu prophylaxis. If flu+ will change to BID.    Upper respiratory tract infection, unspecified type       Await flu and COVID. Treat as needed.    Relevant Medications   oseltamivir (TAMIFLU) 75 MG capsule   Other Relevant Orders   Veritor Flu A/B Waived   Novel Coronavirus, NAA ([facility])         Follow up plan: Return if symptoms worsen or fail to improve.  This visit was completed via telephone due to the restrictions of the COVID-19 pandemic. All issues as above were discussed and addressed but no physical exam was performed. If it was felt that the patient should be evaluated in the office, they were directed there. The patient verbally consented to this visit. Patient was unable to complete an audio/visual visit due to Lack of equipment. Due to the catastrophic nature of the COVID-19 pandemic, this visit was done through audio contact only. Location of the patient: parking lot Location of the provider: work Those involved with this call:  Provider: Park Liter, DO CMA: Louanna Raw, CMA Front Desk/Registration: [employer]  Time spent on call:  15 minutes on the phone discussing health concerns. 23 minutes total spent in review of patient's record and preparation of their chart.

## 2021-05-21 LAB — NOVEL CORONAVIRUS, NAA: SARS-CoV-2, NAA: NOT DETECTED

## 2021-05-21 LAB — VERITOR FLU A/B WAIVED
Influenza A: POSITIVE — AB
Influenza B: NEGATIVE

## 2021-05-27 ENCOUNTER — Encounter: Admission: RE | Payer: Self-pay | Source: Home / Self Care

## 2021-05-27 ENCOUNTER — Ambulatory Visit: Admission: RE | Admit: 2021-05-27 | Payer: 59 | Source: Home / Self Care | Admitting: Gastroenterology

## 2021-05-27 SURGERY — EGD (ESOPHAGOGASTRODUODENOSCOPY)
Anesthesia: General

## 2021-06-05 ENCOUNTER — Encounter: Payer: Self-pay | Admitting: Family Medicine

## 2021-06-05 ENCOUNTER — Ambulatory Visit (INDEPENDENT_AMBULATORY_CARE_PROVIDER_SITE_OTHER): Payer: 59 | Admitting: Family Medicine

## 2021-06-05 VITALS — BP 190/95 | HR 73 | Temp 98.4°F | Wt 237.4 lb

## 2021-06-05 DIAGNOSIS — J441 Chronic obstructive pulmonary disease with (acute) exacerbation: Secondary | ICD-10-CM | POA: Diagnosis not present

## 2021-06-05 MED ORDER — ALBUTEROL SULFATE HFA 108 (90 BASE) MCG/ACT IN AERS: 2.0000 | INHALATION_SPRAY | Freq: Four times a day (QID) | RESPIRATORY_TRACT | 0 refills | Status: DC | PRN

## 2021-06-05 MED ORDER — PREDNISONE 10 MG PO TABS: ORAL_TABLET | ORAL | 0 refills | Status: DC

## 2021-06-05 MED ORDER — BENZONATATE 200 MG PO CAPS: 200.0000 mg | ORAL_CAPSULE | Freq: Two times a day (BID) | ORAL | 0 refills | Status: DC | PRN

## 2021-06-05 NOTE — Progress Notes (Signed)
BP (!) 190/95    Pulse 73    Temp 98.4 F (36.9 C) (Oral)    Wt 237 lb 6.4 oz (107.7 kg)    SpO2 97%    BMI 40.75 kg/m    Subjective:    Patient ID: Hannah Kaiser, female    DOB: 1961/01/03, 60 y.o.   MRN: [id number]  HPI: Hannah Kaiser is a 60 y.o. female  Chief Complaint  Patient presents with   Cough   UPPER RESPIRATORY TRACT INFECTION Duration: about 2 weeks Worst symptom: cough Fever: no Cough: yes Shortness of breath: yes Wheezing: yes Chest pain: no Chest tightness: yes Chest congestion: yes Nasal congestion: yes Runny nose: yes Post nasal drip: yes Sneezing: no Sore throat: no Swollen glands: no Sinus pressure: no Headache: no Face pain: no Toothache: no Ear pain: no  Ear pressure: no  Eyes red/itching:no Eye drainage/crusting: no  Vomiting: no Rash: no Fatigue: yes Sick contacts: yes Strep contacts: no  Context: stable Recurrent sinusitis: no Relief with OTC cold/cough medications: no  Treatments attempted: tamiflu    Relevant past medical, surgical, family and social history reviewed and updated as indicated. Interim medical history since our last visit reviewed. Allergies and medications reviewed and updated.  Review of Systems  Constitutional:  Positive for fatigue. Negative for activity change, appetite change, chills, diaphoresis, fever and unexpected weight change.  HENT:  Positive for congestion and rhinorrhea. Negative for dental problem, drooling, ear discharge, ear pain, facial swelling, hearing loss, mouth sores, nosebleeds, postnasal drip, sinus pressure, sinus pain, sneezing, sore throat, tinnitus, trouble swallowing and voice change.   Eyes: Negative.   Respiratory:  Positive for cough, chest tightness, shortness of breath and wheezing. Negative for apnea, choking and stridor.   Cardiovascular: Negative.   Gastrointestinal: Negative.   Neurological: Negative.   Psychiatric/Behavioral: Negative.     Per HPI unless specifically  indicated above     Objective:    BP (!) 190/95    Pulse 73    Temp 98.4 F (36.9 C) (Oral)    Wt 237 lb 6.4 oz (107.7 kg)    SpO2 97%    BMI 40.75 kg/m   Wt Readings from Last 3 Encounters:  06/05/21 237 lb 6.4 oz (107.7 kg)  04/28/21 234 lb 12.8 oz (106.5 kg)  04/09/21 231 lb 6.4 oz (105 kg)    Physical Exam Vitals and nursing note reviewed.  Constitutional:      General: She is not in acute distress.    Appearance: Normal appearance. She is not ill-appearing, toxic-appearing or diaphoretic.  HENT:     Head: Normocephalic and atraumatic.     Right Ear: External ear normal.     Left Ear: External ear normal.     Nose: Nose normal.     Mouth/Throat:     Mouth: Mucous membranes are moist.     Pharynx: Oropharynx is clear.  Eyes:     General: No scleral icterus.       Right eye: No discharge.        Left eye: No discharge.     Extraocular Movements: Extraocular movements intact.     Conjunctiva/sclera: Conjunctivae normal.     Pupils: Pupils are equal, round, and reactive to light.  Cardiovascular:     Rate and Rhythm: Normal rate and regular rhythm.     Pulses: Normal pulses.     Heart sounds: Normal heart sounds. No murmur heard.   No friction rub. No gallop.  Pulmonary:     Effort: Pulmonary effort is normal. No respiratory distress.     Breath sounds: No stridor. Wheezing present. No rhonchi or rales.  Chest:     Chest wall: No tenderness.  Musculoskeletal:        General: Normal range of motion.     Cervical back: Normal range of motion and neck supple.  Skin:    General: Skin is warm and dry.     Capillary Refill: Capillary refill takes less than 2 seconds.     Coloration: Skin is not jaundiced or pale.     Findings: No bruising, erythema, lesion or rash.  Neurological:     General: No focal deficit present.     Mental Status: She is alert and oriented to person, place, and time. Mental status is at baseline.  Psychiatric:        Mood and Affect: Mood  normal.        Behavior: Behavior normal.        Thought Content: Thought content normal.        Judgment: Judgment normal.    Results for orders placed or performed in visit on 05/20/21  Novel Coronavirus, NAA ([facility])   Specimen: Saline  Result Value Ref Range   SARS-CoV-2, NAA Not Detected Not Detected  Veritor Flu A/B Waived  Result Value Ref Range   Influenza A Positive (A) Negative   Influenza B Negative Negative      Assessment & Plan:   Problem List Items Addressed This Visit   None Visit Diagnoses     Chronic obstructive pulmonary disease with acute exacerbation ([place])    -  Primary   Will treat with prednisone, tessalon and albuterol. Call with any concerns or if not getting better.    Relevant Medications   benzonatate (TESSALON) 200 MG capsule   albuterol (VENTOLIN HFA) 108 (90 Base) MCG/ACT inhaler   predniSONE (DELTASONE) 10 MG tablet        Follow up plan: Return if symptoms worsen or fail to improve.

## 2021-08-19 ENCOUNTER — Ambulatory Visit (INDEPENDENT_AMBULATORY_CARE_PROVIDER_SITE_OTHER): Payer: 59 | Admitting: Internal Medicine

## 2021-08-19 ENCOUNTER — Encounter: Payer: Self-pay | Admitting: Internal Medicine

## 2021-08-19 ENCOUNTER — Other Ambulatory Visit: Payer: Self-pay

## 2021-08-19 VITALS — BP 148/85 | HR 57 | Temp 98.4°F | Ht 64.02 in | Wt 236.2 lb

## 2021-08-19 DIAGNOSIS — K111 Hypertrophy of salivary gland: Secondary | ICD-10-CM | POA: Insufficient documentation

## 2021-08-19 DIAGNOSIS — J3489 Other specified disorders of nose and nasal sinuses: Secondary | ICD-10-CM | POA: Insufficient documentation

## 2021-08-19 MED ORDER — SULFAMETHOXAZOLE-TRIMETHOPRIM 800-160 MG PO TABS: 1.0000 | ORAL_TABLET | Freq: Two times a day (BID) | ORAL | 0 refills | Status: AC

## 2021-08-19 MED ORDER — MUPIROCIN CALCIUM 2 % EX CREA: 1.0000 "application " | TOPICAL_CREAM | Freq: Three times a day (TID) | CUTANEOUS | 0 refills | Status: AC

## 2021-08-19 NOTE — Progress Notes (Signed)
? ?BP (!) 148/85   Pulse (!) 57   Temp 98.4 ?F (36.9 ?C) (Oral)   Ht 5' 4.02" (1.626 m)   Wt 236 lb 3.2 oz (107.1 kg)   SpO2 99%   BMI 40.52 kg/m?   ? ?Subjective:  ? ? Patient ID: Hannah Kaiser, female    DOB: 27-Apr-1961, 61 y.o.   MRN: [id number] ? ?Chief Complaint  ?Patient presents with  ? Jaw Pain  ?  Right jaw and ear pain past week has been swollen for the past month  ? lesion   ?  Inside both nostrils  ? ? ?HPI: ?Hannah Kaiser is a 61 y.o. female ? ?Pt is here for right salivary gland swelling has had the same in her left salivary gland  ?Is a hair dresser  ?Has sores in her bil nose x 1 month. Hasnt seen [place] since then and daughter forced her to come in here. ? ?Oral Pain  ?This is a new problem. The current episode started 1 to 4 weeks ago. The problem occurs constantly. The problem has been waxing and waning. The pain is at a severity of 8/10. Associated symptoms include facial pain. Pertinent negatives include no difficulty swallowing, fever, oral bleeding, sinus pressure or thermal sensitivity. She has tried nothing for the symptoms.  ? ?Chief Complaint  ?Patient presents with  ? Jaw Pain  ?  Right jaw and ear pain past week has been swollen for the past month  ? lesion   ?  Inside both nostrils  ? ? ?Relevant past medical, surgical, family and social history reviewed and updated as indicated. Interim medical history since our last visit reviewed. ?Allergies and medications reviewed and updated. ? ?Review of Systems  ?Constitutional:  Negative for fever.  ?HENT:  Negative for sinus pressure.   ? ?Per HPI unless specifically indicated above ? ?   ?Objective:  ?  ?BP (!) 148/85   Pulse (!) 57   Temp 98.4 ?F (36.9 ?C) (Oral)   Ht 5' 4.02" (1.626 m)   Wt 236 lb 3.2 oz (107.1 kg)   SpO2 99%   BMI 40.52 kg/m?   ?Wt Readings from Last 3 Encounters:  ?08/19/21 236 lb 3.2 oz (107.1 kg)  ?06/05/21 237 lb 6.4 oz (107.7 kg)  ?04/28/21 234 lb 12.8 oz (106.5 kg)  ?  ?Physical Exam ?Vitals and nursing note  reviewed.  ?Constitutional:   ?   General: She is not in acute distress. ?   Appearance: Normal appearance. She is not ill-appearing or diaphoretic.  ?HENT:  ?   Head:  ?   Comments: Swelling noted on the anterior part of the right cheek ?   Right Ear: External ear normal. There is no impacted cerumen.  ?   Left Ear: External ear normal. There is no impacted cerumen.  ?   Nose: No congestion or rhinorrhea.  ?   Comments: Nasal lesons noted bilaterally  ?   Mouth/Throat:  ?   Pharynx: No oropharyngeal exudate or posterior oropharyngeal erythema.  ?Musculoskeletal:     ?   General: No swelling or tenderness.  ?Skin: ?   General: Skin is warm.  ?   Coloration: Skin is not jaundiced.  ?   Findings: No erythema.  ?Neurological:  ?   Mental Status: She is alert.  ? ? ?Results for orders placed or performed in visit on 05/20/21  ?Novel Coronavirus, NAA ([facility])  ? Specimen: Saline  ?Result Value Ref Range  ?  SARS-CoV-2, NAA Not Detected Not Detected  ?Veritor Flu A/B Waived  ?Result Value Ref Range  ? Influenza A Positive (A) Negative  ? Influenza B Negative Negative  ? ?   ? ? ?Current Outpatient Medications:  ?  albuterol (VENTOLIN HFA) 108 (90 Base) MCG/ACT inhaler, Inhale 2 puffs into the lungs every 6 (six) hours as needed for wheezing or shortness of breath., Disp: 8 g, Rfl: 0 ?  amLODipine (NORVASC) 10 MG tablet, Take 1 tablet (10 mg total) by mouth daily., Disp: 90 tablet, Rfl: 1 ?  atenolol (TENORMIN) 50 MG tablet, Take 1 tablet (50 mg total) by mouth daily., Disp: 90 tablet, Rfl: 1 ?  busPIRone (BUSPAR) 15 MG tablet, Take 1 tablet (15 mg total) by mouth 3 (three) times daily., Disp: 270 tablet, Rfl: 1 ?  clonazePAM (KLONOPIN) 1 MG tablet, Take 1 tablet (1 mg total) by mouth 2 (two) times daily as needed. for anxiety, Disp: 30 tablet, Rfl: 0 ?  ezetimibe (ZETIA) 10 MG tablet, Take 1 tablet (10 mg total) by mouth daily., Disp: 90 tablet, Rfl: 1 ?  furosemide (LASIX) 20 MG tablet, TAKE 1 TABLET BY MOUTH ONCE DAILY  AS NEEDED FOR SWELLING, Disp: 60 tablet, Rfl: 0 ?  hydrOXYzine (ATARAX/VISTARIL) 25 MG tablet, Take 1 tablet (25 mg total) by mouth 3 (three) times daily as needed., Disp: 90 tablet, Rfl: 1 ?  ibuprofen (ADVIL) 800 MG tablet, Take 1 tablet (800 mg total) by mouth every 8 (eight) hours as needed., Disp: 270 tablet, Rfl: 1 ?  isosorbide mononitrate (IMDUR) 30 MG 24 hr tablet, Take 1 tablet (30 mg total) by mouth daily., Disp: 90 tablet, Rfl: 1 ?  latanoprost (XALATAN) 0.005 % ophthalmic solution, Place 1 drop into both eyes at bedtime., Disp: , Rfl:  ?  losartan (COZAAR) 100 MG tablet, Take 1 tablet (100 mg total) by mouth daily., Disp: 90 tablet, Rfl: 1 ?  mupirocin cream (BACTROBAN) 2 %, Apply 1 application. topically 3 (three) times daily for 5 days., Disp: 15 g, Rfl: 0 ?  naproxen (NAPROSYN) 500 MG tablet, Take 1 tablet (500 mg total) by mouth 2 (two) times daily with a meal., Disp: 30 tablet, Rfl: 0 ?  nystatin-triamcinolone ointment (MYCOLOG), Apply 1 application topically 2 (two) times daily., Disp: 30 g, Rfl: 1 ?  pantoprazole (PROTONIX) 40 MG tablet, Take 1 tablet (40 mg total) by mouth 2 (two) times daily., Disp: 180 tablet, Rfl: 1 ?  QUEtiapine (SEROQUEL) 25 MG tablet, Take 1 tablet (25 mg total) by mouth at bedtime., Disp: 90 tablet, Rfl: 1 ?  rosuvastatin (CRESTOR) 5 MG tablet, Take 1 tablet (5 mg total) by mouth every other day., Disp: 90 tablet, Rfl: 1 ?  sulfamethoxazole-trimethoprim (BACTRIM DS) 800-160 MG tablet, Take 1 tablet by mouth 2 (two) times daily for 5 days., Disp: 10 tablet, Rfl: 0 ?  triamcinolone ointment (KENALOG) 0.5 %, Apply 1 application topically 2 (two) times daily., Disp: 30 g, Rfl: 0 ?  valACYclovir (VALTREX) 1000 MG tablet, Take 1 tablet (1,000 mg total) by mouth daily., Disp: 60 tablet, Rfl: 0  ? ? ?Assessment & Plan:  ? ?Parotid gland swelling ?Wil refer to ENT. ?Start warm compresses and ibuprofen.  ? ?Nsasl sores  ?Will swab for ? MRSA in nasal surfaces. ? 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  ?Goes every days,  ?Stressed hand hygiene  and sanitization of instruments and hands after every hair  ? ? ?Problem List Items Addressed  This Visit   ? ?  ? Digestive  ? Parotid gland enlargement - Primary  ? Relevant Orders  ? Ambulatory referral to ENT  ?  ? Other  ? Nasal sore  ? Relevant Orders  ? Wound culture  ?  ? ?Orders Placed This Encounter  ?Procedures  ? Wound culture  ? Ambulatory referral to ENT  ?  ? ?Meds ordered this encounter  ?Medications  ? mupirocin cream (BACTROBAN) 2 %  ?  Sig: Apply 1 application. topically 3 (three) times daily for 5 days.  ?  Dispense:  15 g  ?  Refill:  0  ? sulfamethoxazole-trimethoprim (BACTRIM DS) 800-160 MG tablet  ?  Sig: Take 1 tablet by mouth 2 (two) times daily for 5 days.  ?  Dispense:  10 tablet  ?  Refill:  0  ?  ? ?Follow up plan: ?No follow-ups on file. ? ? ?

## 2021-08-21 ENCOUNTER — Ambulatory Visit: Payer: 59

## 2021-08-22 ENCOUNTER — Other Ambulatory Visit: Payer: Self-pay

## 2021-08-22 MED ORDER — MUPIROCIN 2 % EX OINT: 1.0000 "application " | TOPICAL_OINTMENT | Freq: Two times a day (BID) | CUTANEOUS | 0 refills | Status: DC

## 2021-08-22 NOTE — Progress Notes (Signed)
Patient returned to clinic to have a MRSA nasal swab that was not available at her visit on 08/19/21. Patient tolerated well.  ?

## 2021-08-24 LAB — WOUND CULTURE: Organism ID, Bacteria: NONE SEEN

## 2021-08-25 ENCOUNTER — Other Ambulatory Visit: Payer: Self-pay

## 2021-08-25 MED ORDER — FLUOXETINE HCL 40 MG PO CAPS: 80.0000 mg | ORAL_CAPSULE | Freq: Every day | ORAL | 0 refills | Status: DC

## 2021-08-25 NOTE — Progress Notes (Signed)
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

## 2021-08-25 NOTE — Telephone Encounter (Signed)
Last seen on 06/05/21 ? ?Up coming visit 09/29/21  ? ?Patient is requesting refills ?

## 2021-09-15 ENCOUNTER — Ambulatory Visit (INDEPENDENT_AMBULATORY_CARE_PROVIDER_SITE_OTHER): Payer: 59 | Admitting: Family Medicine

## 2021-09-15 ENCOUNTER — Encounter: Payer: Self-pay | Admitting: Family Medicine

## 2021-09-15 VITALS — BP 136/76 | HR 64 | Temp 98.5°F | Wt 239.8 lb

## 2021-09-15 DIAGNOSIS — F332 Major depressive disorder, recurrent severe without psychotic features: Secondary | ICD-10-CM

## 2021-09-15 DIAGNOSIS — I2510 Atherosclerotic heart disease of native coronary artery without angina pectoris: Secondary | ICD-10-CM | POA: Diagnosis not present

## 2021-09-15 DIAGNOSIS — K219 Gastro-esophageal reflux disease without esophagitis: Secondary | ICD-10-CM

## 2021-09-15 DIAGNOSIS — K111 Hypertrophy of salivary gland: Secondary | ICD-10-CM

## 2021-09-15 DIAGNOSIS — I129 Hypertensive chronic kidney disease with stage 1 through stage 4 chronic kidney disease, or unspecified chronic kidney disease: Secondary | ICD-10-CM

## 2021-09-15 DIAGNOSIS — E782 Mixed hyperlipidemia: Secondary | ICD-10-CM

## 2021-09-15 DIAGNOSIS — R519 Headache, unspecified: Secondary | ICD-10-CM

## 2021-09-15 DIAGNOSIS — F419 Anxiety disorder, unspecified: Secondary | ICD-10-CM

## 2021-09-15 MED ORDER — NAPROXEN 500 MG PO TABS: 500.0000 mg | ORAL_TABLET | Freq: Two times a day (BID) | ORAL | 1 refills | Status: DC

## 2021-09-15 MED ORDER — ISOSORBIDE MONONITRATE ER 30 MG PO TB24: 30.0000 mg | ORAL_TABLET | Freq: Every day | ORAL | 1 refills | Status: DC

## 2021-09-15 MED ORDER — FUROSEMIDE 20 MG PO TABS: ORAL_TABLET | ORAL | 0 refills | Status: DC

## 2021-09-15 MED ORDER — CLONAZEPAM 1 MG PO TABS: 1.0000 mg | ORAL_TABLET | Freq: Two times a day (BID) | ORAL | 0 refills | Status: DC | PRN

## 2021-09-15 MED ORDER — ROSUVASTATIN CALCIUM 5 MG PO TABS: 5.0000 mg | ORAL_TABLET | ORAL | 1 refills | Status: DC

## 2021-09-15 MED ORDER — ALBUTEROL SULFATE HFA 108 (90 BASE) MCG/ACT IN AERS: 2.0000 | INHALATION_SPRAY | Freq: Four times a day (QID) | RESPIRATORY_TRACT | 6 refills | Status: DC | PRN

## 2021-09-15 MED ORDER — QUETIAPINE FUMARATE 25 MG PO TABS: 25.0000 mg | ORAL_TABLET | Freq: Every day | ORAL | 1 refills | Status: DC

## 2021-09-15 MED ORDER — VALACYCLOVIR HCL 1 G PO TABS: 1000.0000 mg | ORAL_TABLET | Freq: Every day | ORAL | 1 refills | Status: DC

## 2021-09-15 MED ORDER — ATENOLOL 50 MG PO TABS: 50.0000 mg | ORAL_TABLET | Freq: Every day | ORAL | 1 refills | Status: DC

## 2021-09-15 MED ORDER — LOSARTAN POTASSIUM 100 MG PO TABS: 100.0000 mg | ORAL_TABLET | Freq: Every day | ORAL | 1 refills | Status: DC

## 2021-09-15 MED ORDER — EZETIMIBE 10 MG PO TABS: 10.0000 mg | ORAL_TABLET | Freq: Every day | ORAL | 1 refills | Status: DC

## 2021-09-15 MED ORDER — PANTOPRAZOLE SODIUM 40 MG PO TBEC: 40.0000 mg | DELAYED_RELEASE_TABLET | Freq: Two times a day (BID) | ORAL | 1 refills | Status: DC

## 2021-09-15 MED ORDER — AMLODIPINE BESYLATE 10 MG PO TABS: 10.0000 mg | ORAL_TABLET | Freq: Every day | ORAL | 1 refills | Status: DC

## 2021-09-15 MED ORDER — HYDROXYZINE HCL 25 MG PO TABS: 25.0000 mg | ORAL_TABLET | Freq: Three times a day (TID) | ORAL | 1 refills | Status: DC | PRN

## 2021-09-15 MED ORDER — BUSPIRONE HCL 15 MG PO TABS: 15.0000 mg | ORAL_TABLET | Freq: Three times a day (TID) | ORAL | 1 refills | Status: DC

## 2021-09-15 MED ORDER — FLUOXETINE HCL 40 MG PO CAPS: 80.0000 mg | ORAL_CAPSULE | Freq: Every day | ORAL | 1 refills | Status: DC

## 2021-09-15 NOTE — Assessment & Plan Note (Signed)
Under good control on current regimen. Continue current regimen. Continue to monitor. Call with any concerns. Refills given. 30 pills should last about 6 months.  ? ?

## 2021-09-15 NOTE — Assessment & Plan Note (Signed)
Under good control on current regimen. Continue current regimen. Continue to monitor. Call with any concerns. Refills given. Labs drawn today.   

## 2021-09-15 NOTE — Patient Instructions (Signed)
Please contact [facility] to schedule an appointment with them. Their phone number is [phone].  ?

## 2021-09-15 NOTE — Assessment & Plan Note (Signed)
Will keep BP and cholesterol under good control. Continue to monitor. Call with any concerns. Refills given today.  ?

## 2021-09-15 NOTE — Progress Notes (Signed)
? ?BP 136/76   Pulse 64   Temp 98.5 ?F (36.9 ?C) (Oral)   Wt 239 lb 12.8 oz (108.8 kg)   SpO2 96%   BMI 41.14 kg/m?   ? ?Subjective:  ? ? Patient ID: Hannah Kaiser, female    DOB: 1960-12-25, 61 y.o.   MRN: [id number] ? ?HPI: ?Hannah Kaiser is a 61 y.o. female ? ?Chief Complaint  ?Patient presents with  ? Facial Swelling  ?  Pt states she has had swelling and pain on the R side of her face for the last month. States the pain moved down up under her chin. States she has been taking ibuprofen for the pain.   ? Hypertension  ? Hyperlipidemia  ? Depression  ? ?Was seen a month ago for parotid gland swelling. Was referred to ENT. They tried to call her but were not able to get her on the phone.  ? ?ANXIETY/DEPRESSION ?Duration: chronic ?Status:stable ?Anxious mood: yes  ?Excessive worrying: yes ?Irritability: yes  ?Sweating: no ?Nausea: no ?Palpitations:no ?Hyperventilation: no ?Panic attacks: no ?Agoraphobia: no  ?Obscessions/compulsions: no ?Depressed mood: yes ? ?  09/15/2021  ?  3:47 PM 08/19/2021  ?  3:29 PM 04/28/2021  ? 10:21 AM 03/31/2021  ? 10:06 AM 10/31/2020  ?  9:10 AM  ?Depression screen PHQ 2/9  ?Decreased Interest 0 0 0 2 0  ?Down, Depressed, Hopeless 0 0 0 2 0  ?PHQ - 2 Score 0 0 0 4 0  ?Altered sleeping '3 1 1 1 1  '$ ?Tired, decreased energy '3 2 3 3 2  '$ ?Change in appetite 3 2 0 1 3  ?Feeling bad or failure about yourself  0 0 0 3 1  ?Trouble concentrating 0 2 0 2 1  ?Moving slowly or fidgety/restless 1 2 0 2 0  ?Suicidal thoughts 0 0 0 0 0  ?PHQ-9 Score '10 9 4 16 8  '$ ?Difficult doing work/chores Somewhat difficult Not difficult at all  Somewhat difficult Somewhat difficult  ? ?Anhedonia: no ?Weight changes: no ?Insomnia: no   ?Hypersomnia: no ?Fatigue/loss of energy: yes ?Feelings of worthlessness: no ?Feelings of guilt: no ?Impaired concentration/indecisiveness: no ?Suicidal ideations: no  ?Crying spells: no ?Recent Stressors/Life Changes: no ?  Relationship problems: no ?  Family stress: yes   ?  Financial  stress: no  ?  Job stress: no  ?  Recent death/loss: no ? ?HYPERTENSION / HYPERLIPIDEMIA ?Satisfied with current treatment? yes ?Duration of hypertension: chronic ?BP monitoring frequency: not checking ?BP medication side effects: no ?Duration of hyperlipidemia: chronic ?Cholesterol medication side effects: no ?Cholesterol supplements: none ?Past cholesterol medications: crestor and zetia ?Medication compliance: excellent compliance ?Aspirin: yes ?Recent stressors: yes ?Recurrent headaches: yes ?Visual changes: no ?Palpitations: no ?Dyspnea: no ?Chest pain: no ?Lower extremity edema: no ?Dizzy/lightheaded: no ? ? ?Relevant past medical, surgical, family and social history reviewed and updated as indicated. Interim medical history since our last visit reviewed. ?Allergies and medications reviewed and updated. ? ?Review of Systems  ?Constitutional: Negative.   ?Respiratory: Negative.    ?Cardiovascular: Negative.   ?Gastrointestinal: Negative.   ?Musculoskeletal:  Positive for myalgias. Negative for arthralgias, back pain, gait problem, joint swelling, neck pain and neck stiffness.  ?Skin: Negative.   ?Neurological: Negative.   ?Psychiatric/Behavioral: Negative.    ? ?Per HPI unless specifically indicated above ? ?   ?Objective:  ?  ?BP 136/76   Pulse 64   Temp 98.5 ?F (36.9 ?C) (Oral)   Wt 239 lb 12.8  oz (108.8 kg)   SpO2 96%   BMI 41.14 kg/m?   ?Wt Readings from Last 3 Encounters:  ?09/15/21 239 lb 12.8 oz (108.8 kg)  ?08/19/21 236 lb 3.2 oz (107.1 kg)  ?06/05/21 237 lb 6.4 oz (107.7 kg)  ?  ?Physical Exam ?Vitals and nursing note reviewed.  ?Constitutional:   ?   General: She is not in acute distress. ?   Appearance: Normal appearance. She is not ill-appearing, toxic-appearing or diaphoretic.  ?HENT:  ?   Head: Normocephalic and atraumatic.  ?   Right Ear: External ear normal.  ?   Left Ear: External ear normal.  ?   Nose: Nose normal.  ?   Mouth/Throat:  ?   Mouth: Mucous membranes are moist.  ?   Pharynx:  Oropharynx is clear.  ?Eyes:  ?   General: No scleral icterus.    ?   Right eye: No discharge.     ?   Left eye: No discharge.  ?   Extraocular Movements: Extraocular movements intact.  ?   Conjunctiva/sclera: Conjunctivae normal.  ?   Pupils: Pupils are equal, round, and reactive to light.  ?Cardiovascular:  ?   Rate and Rhythm: Normal rate and regular rhythm.  ?   Pulses: Normal pulses.  ?   Heart sounds: Normal heart sounds. No murmur heard. ?  No friction rub. No gallop.  ?Pulmonary:  ?   Effort: Pulmonary effort is normal. No respiratory distress.  ?   Breath sounds: Normal breath sounds. No stridor. No wheezing, rhonchi or rales.  ?Chest:  ?   Chest wall: No tenderness.  ?Musculoskeletal:     ?   General: Normal range of motion.  ?   Cervical back: Normal range of motion and neck supple.  ?Skin: ?   General: Skin is warm and dry.  ?   Capillary Refill: Capillary refill takes less than 2 seconds.  ?   Coloration: Skin is not jaundiced or pale.  ?   Findings: No bruising, erythema, lesion or rash.  ?Neurological:  ?   General: No focal deficit present.  ?   Mental Status: She is alert and oriented to person, place, and time. Mental status is at baseline.  ?Psychiatric:     ?   Mood and Affect: Mood normal.     ?   Behavior: Behavior normal.     ?   Thought Content: Thought content normal.     ?   Judgment: Judgment normal.  ? ? ?Results for orders placed or performed in visit on 08/19/21  ?Wound culture  ? Specimen: Wound  ? WO  ?Result Value Ref Range  ? Gram Stain Result Final report   ? Organism ID, Bacteria Comment   ? Organism ID, Bacteria No organisms seen   ? Aerobic Bacterial Culture Final report (A)   ? Organism ID, Bacteria Comment (A)   ? Organism ID, Bacteria Mixed skin flora   ? Antimicrobial Susceptibility Comment   ? ?   ?Assessment & Plan:  ? ?Problem List Items Addressed This Visit   ? ?  ? Cardiovascular and Mediastinum  ? CAD (coronary artery disease) - Primary  ?  Will keep BP and cholesterol  under good control. Continue to monitor. Call with any concerns. Refills given today.  ?  ?  ? Relevant Medications  ? rosuvastatin (CRESTOR) 5 MG tablet  ? losartan (COZAAR) 100 MG tablet  ? isosorbide mononitrate (IMDUR) 30 MG 24 hr  tablet  ? furosemide (LASIX) 20 MG tablet  ? ezetimibe (ZETIA) 10 MG tablet  ? atenolol (TENORMIN) 50 MG tablet  ? amLODipine (NORVASC) 10 MG tablet  ? Other Relevant Orders  ? Comprehensive metabolic panel  ?  ? Digestive  ? GERD (gastroesophageal reflux disease)  ?  Under good control on current regimen. Continue current regimen. Continue to monitor. Call with any concerns. Refills given. Labs drawn today.  ? ?  ?  ? Relevant Medications  ? pantoprazole (PROTONIX) 40 MG tablet  ? Other Relevant Orders  ? Comprehensive metabolic panel  ? CBC with Differential/Platelet  ? Parotid gland enlargement  ?  Will call ENT. Referral already in place.  ?  ?  ?  ? Genitourinary  ? Benign hypertensive renal disease  ?  Under good control on current regimen. Continue current regimen. Continue to monitor. Call with any concerns. Refills given. Labs drawn today.  ? ?  ?  ? Relevant Orders  ? Comprehensive metabolic panel  ?  ? Other  ? Hyperlipidemia  ?  Under good control on current regimen. Continue current regimen. Continue to monitor. Call with any concerns. Refills given. Labs drawn today.  ? ?  ?  ? Relevant Medications  ? rosuvastatin (CRESTOR) 5 MG tablet  ? losartan (COZAAR) 100 MG tablet  ? isosorbide mononitrate (IMDUR) 30 MG 24 hr tablet  ? furosemide (LASIX) 20 MG tablet  ? ezetimibe (ZETIA) 10 MG tablet  ? atenolol (TENORMIN) 50 MG tablet  ? amLODipine (NORVASC) 10 MG tablet  ? Other Relevant Orders  ? Comprehensive metabolic panel  ? Lipid Panel w/o Chol/HDL Ratio  ? Severe episode of recurrent major depressive disorder, without psychotic features ([place])  ?  Under good control on current regimen. Continue current regimen. Continue to monitor. Call with any concerns. Refills given. Labs  drawn today.  ? ?  ?  ? Relevant Medications  ? hydrOXYzine (ATARAX) 25 MG tablet  ? FLUoxetine (PROZAC) 40 MG capsule  ? busPIRone (BUSPAR) 15 MG tablet  ? Other Relevant Orders  ? Comprehensive metabolic panel

## 2021-09-15 NOTE — Assessment & Plan Note (Signed)
Will call ENT. Referral already in place.  ?

## 2021-09-16 ENCOUNTER — Other Ambulatory Visit: Payer: Self-pay | Admitting: Family Medicine

## 2021-09-16 DIAGNOSIS — D649 Anemia, unspecified: Secondary | ICD-10-CM

## 2021-09-16 LAB — LIPID PANEL W/O CHOL/HDL RATIO
Cholesterol, Total: 167 mg/dL (ref 100–199)
HDL: 56 mg/dL (ref >39)
LDL Chol Calc (NIH): 88 mg/dL (ref 0–99)
Triglycerides: 128 mg/dL (ref 0–149)
VLDL Cholesterol Cal: 23 mg/dL (ref 5–40)

## 2021-09-16 LAB — COMPREHENSIVE METABOLIC PANEL
ALT: 10 IU/L (ref 0–32)
AST: 19 IU/L (ref 0–40)
Albumin/Globulin Ratio: 2.4 — ABNORMAL HIGH (ref 1.2–2.2)
Albumin: 4.5 g/dL (ref 3.8–4.9)
Alkaline Phosphatase: 68 IU/L (ref 44–121)
BUN/Creatinine Ratio: 17 (ref 9–23)
BUN: 15 mg/dL (ref 6–24)
Bilirubin Total: 0.5 mg/dL (ref 0.0–1.2)
CO2: 21 mmol/L (ref 20–29)
Calcium: 9.3 mg/dL (ref 8.7–10.2)
Chloride: 105 mmol/L (ref 96–106)
Creatinine, Ser: 0.86 mg/dL (ref 0.57–1.00)
Globulin, Total: 1.9 g/dL (ref 1.5–4.5)
Glucose: 87 mg/dL (ref 70–99)
Potassium: 4.3 mmol/L (ref 3.5–5.2)
Sodium: 140 mmol/L (ref 134–144)
Total Protein: 6.4 g/dL (ref 6.0–8.5)
eGFR: 78 mL/min/{1.73_m2} (ref >59)

## 2021-09-16 LAB — CBC WITH DIFFERENTIAL/PLATELET
Basophils Absolute: 0 10*3/uL (ref 0.0–0.2)
Basos: 0 %
EOS (ABSOLUTE): 0.2 10*3/uL (ref 0.0–0.4)
Eos: 3 %
Hematocrit: 32.5 % — ABNORMAL LOW (ref 34.0–46.6)
Hemoglobin: 10.9 g/dL — ABNORMAL LOW (ref 11.1–15.9)
Immature Grans (Abs): 0 10*3/uL (ref 0.0–0.1)
Immature Granulocytes: 0 %
Lymphocytes Absolute: 2.4 10*3/uL (ref 0.7–3.1)
Lymphs: 32 %
MCH: 26.4 pg — ABNORMAL LOW (ref 26.6–33.0)
MCHC: 33.5 g/dL (ref 31.5–35.7)
MCV: 79 fL (ref 79–97)
Monocytes Absolute: 0.5 10*3/uL (ref 0.1–0.9)
Monocytes: 6 %
Neutrophils Absolute: 4.3 10*3/uL (ref 1.4–7.0)
Neutrophils: 59 %
Platelets: 255 10*3/uL (ref 150–450)
RBC: 4.13 x10E6/uL (ref 3.77–5.28)
RDW: 15.6 % — ABNORMAL HIGH (ref 11.7–15.4)
WBC: 7.5 10*3/uL (ref 3.4–10.8)

## 2021-09-16 LAB — SEDIMENTATION RATE: Sed Rate: 3 mm/hr (ref 0–40)

## 2021-09-18 ENCOUNTER — Ambulatory Visit: Payer: Self-pay | Admitting: *Deleted

## 2021-09-18 ENCOUNTER — Ambulatory Visit (INDEPENDENT_AMBULATORY_CARE_PROVIDER_SITE_OTHER): Payer: 59 | Admitting: Physician Assistant

## 2021-09-18 ENCOUNTER — Encounter: Payer: Self-pay | Admitting: Physician Assistant

## 2021-09-18 VITALS — BP 120/71 | HR 67 | Temp 97.8°F | Wt 239.0 lb

## 2021-09-18 DIAGNOSIS — D649 Anemia, unspecified: Secondary | ICD-10-CM | POA: Diagnosis not present

## 2021-09-18 DIAGNOSIS — K111 Hypertrophy of salivary gland: Secondary | ICD-10-CM

## 2021-09-18 NOTE — Progress Notes (Signed)
? ? ?  ?    Acute Office Visit ? ? ?Patient: Hannah Kaiser   DOB: 27-Aug-1960   61 y.o. Female  MRN: [id number] ?Visit Date: 09/18/2021 ? ?Today's healthcare provider: Dani Gobble Auriel Kist, PA-C  ?Introduced myself to the patient as a Journalist, newspaper and provided education on APPs in clinical practice.  ? ? ?CC: facial swelling and facial pain  ? ?Subjective  ?  ?HPI ?HPI   ? ? Facial Swelling   ? Additional comments: R sided facial swelling not improving since last office visit with Dr. Wynetta Emery per patient. Denies numbness/tingling. States facial pain, along with throat tenderness  ? ?  ?  ?Last edited by Olene Craven, Carolynn Serve, CMA on 09/18/2021  3:18 PM.  ?  ?  ?Noted on 09/15/2021 at visit with PCP - plan was to call ENT to facilitate referral and apt scheduling for patient ? ?States the right side of her face hurts along with preauricular space, sore throat ?States it is hurting more than it did on Monday when she came to meet with PCP ?Aggravating: States yawning causes a different kind of pain  ?Alleviating: Nothing  ?Pain level 7/10  ?Reports metal taste her mouth ?States her lips feel like they have a metallic taste and character.  ?Reports she has tried warm compresses and rubbing the area but nothing has come out nor pain been relieved ? She states her right side is bigger and more swollen than it was on Monday.  ? ? ?ENT has gotten in touch with her but she owes a balance and they will not see her until it is paid ? ?States she had a salivary stone on the left side that resulted in surgery and removal of salivary gland  ? ?States she is having trouble swallowing and in the mornings she is unable to talk normally  ? ? ? ?Medications: ?Outpatient Medications Prior to Visit  ?Medication Sig  ? albuterol (VENTOLIN HFA) 108 (90 Base) MCG/ACT inhaler Inhale 2 puffs into the lungs every 6 (six) hours as needed for wheezing or shortness of breath.  ? amLODipine (NORVASC) 10 MG tablet Take 1 tablet (10 mg total) by mouth daily.  ?  atenolol (TENORMIN) 50 MG tablet Take 1 tablet (50 mg total) by mouth daily.  ? busPIRone (BUSPAR) 15 MG tablet Take 1 tablet (15 mg total) by mouth 3 (three) times daily.  ? clonazePAM (KLONOPIN) 1 MG tablet Take 1 tablet (1 mg total) by mouth 2 (two) times daily as needed. for anxiety  ? ezetimibe (ZETIA) 10 MG tablet Take 1 tablet (10 mg total) by mouth daily.  ? FLUoxetine (PROZAC) 40 MG capsule Take 2 capsules (80 mg total) by mouth daily.  ? furosemide (LASIX) 20 MG tablet TAKE 1 TABLET BY MOUTH ONCE DAILY AS NEEDED FOR SWELLING  ? hydrOXYzine (ATARAX) 25 MG tablet Take 1 tablet (25 mg total) by mouth 3 (three) times daily as needed.  ? ibuprofen (ADVIL) 800 MG tablet Take 1 tablet (800 mg total) by mouth every 8 (eight) hours as needed.  ? isosorbide mononitrate (IMDUR) 30 MG 24 hr tablet Take 1 tablet (30 mg total) by mouth daily.  ? latanoprost (XALATAN) 0.005 % ophthalmic solution Place 1 drop into both eyes at bedtime.  ? losartan (COZAAR) 100 MG tablet Take 1 tablet (100 mg total) by mouth daily.  ? mupirocin ointment (BACTROBAN) 2 % Apply 1 application. topically 2 (two) times daily.  ? naproxen (NAPROSYN) 500 MG tablet  Take 1 tablet (500 mg total) by mouth 2 (two) times daily with a meal.  ? nystatin-triamcinolone ointment (MYCOLOG) Apply 1 application topically 2 (two) times daily.  ? pantoprazole (PROTONIX) 40 MG tablet Take 1 tablet (40 mg total) by mouth 2 (two) times daily.  ? QUEtiapine (SEROQUEL) 25 MG tablet Take 1 tablet (25 mg total) by mouth at bedtime.  ? rosuvastatin (CRESTOR) 5 MG tablet Take 1 tablet (5 mg total) by mouth every other day.  ? triamcinolone ointment (KENALOG) 0.5 % Apply 1 application topically 2 (two) times daily.  ? valACYclovir (VALTREX) 1000 MG tablet Take 1 tablet (1,000 mg total) by mouth daily.  ? ?No facility-administered medications prior to visit.  ? ?Past Surgical History:  ?Procedure Laterality Date  ? BACK SURGERY    ? [place]    ? BROW LIFT  Bilateral 03/24/2016  ? Procedure: BLEPHAROPLASTY;  Surgeon: Karle Starch, MD;  Location: [facility];  Service: Ophthalmology;  Laterality: Bilateral;  sleep apnea  ? CARDIAC CATHETERIZATION    ? COLONOSCOPY WITH PROPOFOL N/A 10/19/2016  ? Procedure: COLONOSCOPY WITH PROPOFOL;  Surgeon: Lucilla Lame, MD;  Location: [facility];  Service: Endoscopy;  Laterality: N/A;  ? DILATION AND CURETTAGE OF UTERUS    ? ECTOPIC PREGNANCY SURGERY    ? x2  ? ESOPHAGOGASTRODUODENOSCOPY (EGD) WITH PROPOFOL N/A 10/19/2016  ? Procedure: ESOPHAGOGASTRODUODENOSCOPY (EGD) WITH PROPOFOL;  Surgeon: Lucilla Lame, MD;  Location: [facility];  Service: Endoscopy;  Laterality: N/A;  ? LEFT HEART CATH AND CORONARY ANGIOGRAPHY Left 12/29/2016  ? Procedure: Left Heart Cath and Coronary Angiography;  Surgeon: Dionisio David, MD;  Location: [facility];  Service: Cardiovascular;  Laterality: Left;  ? NECK SURGERY  2003  ? POLYPECTOMY  10/19/2016  ? Procedure: POLYPECTOMY;  Surgeon: Lucilla Lame, MD;  Location: [facility];  Service: Endoscopy;;  ? [place]  ? TOTAL ABDOMINAL HYSTERECTOMY W/ BILATERAL SALPINGOOPHORECTOMY  2014  ? TUBAL LIGATION    ? ? ? ? ?Review of Systems  ?Constitutional:  Positive for chills. Negative for fever.  ?HENT:  Positive for ear pain, facial swelling, sore throat and trouble swallowing. Negative for drooling.   ?Respiratory:  Negative for shortness of breath.   ?Musculoskeletal:  Positive for neck pain.  ? ? ?  Objective  ?  ?BP 120/71   Pulse 67   Temp 97.8 ?F (36.6 ?C) (Oral)   Wt 239 lb (108.4 kg)   SpO2 97%   BMI 41.00 kg/m?  ? ? ?Physical Exam ?Vitals reviewed.  ?Constitutional:   ?   Appearance: Normal appearance. She is obese.  ?HENT:  ?   Head: Normocephalic and atraumatic.  ?   Mouth/Throat:  ?   Mouth: Mucous membranes are moist.  ?   Dentition: Has dentures.  ?   Tongue: No lesions. Tongue does not deviate from midline.  ?   Palate: No mass and  lesions.  ?   Pharynx: No pharyngeal swelling, oropharyngeal exudate, posterior oropharyngeal erythema or uvula swelling.  ?   Tonsils: No tonsillar exudate or tonsillar abscesses.  ?   Comments: Swelling noted along the right cheek and mandible  ?Wharton's duct orifice appears normal  ?Saliva in mouth appears clear and runny - no signs of purulence on exam ?Right parotid gland is enlarged and Right stenson's duct appears to have opaque white stone at orifice  ?Not purulent drainage was expressed on exam- tender to palpation  ?  Neurological:  ?   Mental Status: She is alert.  ?Psychiatric:     ?   Attention and Perception: Attention normal.     ?   Mood and Affect: Mood and affect normal.     ?   Speech: Speech normal.     ?   Behavior: Behavior normal. Behavior is cooperative.  ?  ? ? ?No results found for any visits on 09/18/21. ? Assessment & Plan  ?  ? ?Problem List Items Addressed This Visit   ? ?  ? Digestive  ? Parotid gland enlargement - Primary ? Acute, ongoing concern ?Per chart review patient has been seen for this issue twice beginning in March ?She reports the swelling has gotten worse but denies signs or symptoms of active infection ?Referrals were placed to ENT but patient is unable to make apt - would prefer alternative to [facility]  ?Will place referral today to Otolaryngology for evaluation and further management as conservative measures are not yielding resolution ?While waiting to see specialty recommend the following: stay well hydrated, apply warm compress to the are, gentle massage of parotid and submandibular glands and ducts to encourage drainage, use of sour candies, food/ drink to aid in saliva production ?Encouraged patient to reduce use of cigarettes as these are a contributing factor to developing salivary stones  ?Reviewed ED precautions with patient  ?Follow up as needed for progressing or persistent symptoms.  ?  ? Relevant Orders  ? Ambulatory referral to ENT  ? ? ? ? ? ? ?No  follow-ups on file. ? ? ?I, Cartier Mapel E Jasmia Angst, PA-C, have reviewed all documentation for this visit. The documentation on 09/18/21 for the exam, diagnosis, procedures, and orders are all accurate and complete. ? ?Zaide Kardell Me

## 2021-09-18 NOTE — Telephone Encounter (Signed)
?  Chief Complaint: right facial swelling requesting pain medication ?Symptoms: right facial swelling from front of ear to neck size of baseball. Sore throat last night not now. Was referred to ENT but unable to get in due paying a bill and can not get appt.  ?Frequency: last night  ?Pertinent Negatives: Patient denies difficulty breathing no fever, can swallow ?Disposition: '[]'$ ED /'[]'$ Urgent Care (no appt availability in office) / '[x]'$ Appointment(In office/virtual)/ '[]'$  [facility]/ '[]'$ Home Care/ '[]'$ Refused Recommended Disposition /'[]'$ [facility]/ '[]'$  Follow-up with PCP ?Additional Notes:  ? ?Contacted [facility] and able to schedule appt today with provider other then PCP. ? ? ? Reason for Disposition ? Face swelling is painful to touch ? ?Answer Assessment - Initial Assessment Questions ?1. ONSET: "When did the swelling start?" (e.g., minutes, hours, days) ?    A few weeks ago  ?2. LOCATION: "What part of the face is swollen?" ?    Right side from front of ear to neck  ?3. SEVERITY: "How swollen is it?" ?    Size of baseball ?4. ITCHING: "Is there any itching?" If Yes, ask: "How much?"   (Scale 1-10; mild, moderate or severe) ?    na ?5. PAIN: "Is the swelling painful to touch?" If Yes, ask: "How painful is it?"   (Scale 1-10; mild, moderate or severe) ?  - NONE (0): no pain ?  - MILD (1-3): doesn't interfere with normal activities  ?  - MODERATE (4-7): interferes with normal activities or awakens from sleep  ?  - SEVERE (8-10): excruciating pain, unable to do any normal activities  ?    Yes reports sore throat last night.  ?6. FEVER: "Do you have a fever?" If Yes, ask: "What is it, how was it measured, and when did it start?"  ?    na ?7. CAUSE: "What do you think is causing the face swelling?" ?    Not sure has been referred to ENT ?8. RECURRENT SYMPTOM: "Have you had face swelling before?" If Yes, ask: "When was the last time?" "What happened that time?" ?    Yes  ?9. OTHER SYMPTOMS: "Do you have  any other symptoms?" (e.g., toothache, leg swelling) ?    Sore throat last night not now  ?10. PREGNANCY: "Is there any chance you are pregnant?" "When was your last menstrual period?" ?      na ? ?Protocols used: Face Swelling-A-AH ? ?

## 2021-09-18 NOTE — Patient Instructions (Addendum)
I believe that you may have a blocked Salivary Gland on the right side of your face ?I am sending in a referral to an Otolaryngology dept - similar to ENT with the goal of getting you evaluated ?In the meantime I recommend the following ?Stay well hydrated ?Apply warm compresses to the area and gently massage from the ear towards the mouth ?Try eating sour candies or food/ drink to encourage saliva production and hopefully assist with stone expulsion ?You can use Tylenol and Ibuprofen to assist with pain as needed  ?Try to reduce your use of cigarettes as these can increase the chances of developing salivary stones and dry mouth.  ? ?If you notice the following please go to the ED for evaluation: swelling of your neck and sides of your throat, fever, white or opaque colored saliva, trouble breathing,  ?

## 2021-09-18 NOTE — Assessment & Plan Note (Signed)
Acute, ongoing concern ?Per chart review patient has been seen for this issue twice beginning in March ?She reports the swelling has gotten worse but denies signs or symptoms of active infection ?Referrals were placed to ENT but patient is unable to make apt - would prefer alternative to [facility]  ?Will place referral today to Otolaryngology for evaluation and further management as conservative measures are not yielding resolution ?While waiting to see specialty recommend the following: stay well hydrated, apply warm compress to the are, gentle massage of parotid and submandibular glands and ducts to encourage drainage, use of sour candies, food/ drink to aid in saliva production ?Encouraged patient to reduce use of cigarettes as these are a contributing factor to developing salivary stones  ?Reviewed ED precautions with patient  ?Follow up as needed for progressing or persistent symptoms.  ?

## 2021-09-19 LAB — FERRITIN: Ferritin: 13 ng/mL — ABNORMAL LOW (ref 15–150)

## 2021-09-19 LAB — IRON AND TIBC
Iron Saturation: 15 % (ref 15–55)
Iron: 59 ug/dL (ref 27–159)
Total Iron Binding Capacity: 399 ug/dL (ref 250–450)
UIBC: 340 ug/dL (ref 131–425)

## 2021-09-19 LAB — VITAMIN B12: Vitamin B-12: 295 pg/mL (ref 232–1245)

## 2021-09-22 ENCOUNTER — Telehealth: Payer: Self-pay

## 2021-09-22 ENCOUNTER — Other Ambulatory Visit: Payer: 59

## 2021-09-22 DIAGNOSIS — K111 Hypertrophy of salivary gland: Secondary | ICD-10-CM

## 2021-09-22 NOTE — Telephone Encounter (Signed)
Called and spoke to patient and her daughter. Patient would like to go ahead with a STAT CT just to be sure as she is worried.  ? ?Hannah Kaiser, patient states that she has not heard about the ENT referral. States she cannot go to [facility] due to a bill. Can we see if the patient can go somewhere else sooner rather than later if at all possible? ?

## 2021-09-22 NOTE — Telephone Encounter (Signed)
Patient's daughter called in stating that the patient is still having issues with her face. States that the patient is in a lot of pain, has not been herself and has been sleeping a lot. Daughter asking if some kind of imaging can be down to look at the patients face/head. Please advise.  ?

## 2021-09-22 NOTE — Telephone Encounter (Signed)
Patient notified that imaging has been ordered.  ?

## 2021-09-22 NOTE — Telephone Encounter (Signed)
Hannah Kaiser, have we started on this ne referral yet? ?

## 2021-09-23 ENCOUNTER — Other Ambulatory Visit: Payer: Self-pay | Admitting: Physician Assistant

## 2021-09-23 ENCOUNTER — Ambulatory Visit
Admission: RE | Admit: 2021-09-23 | Discharge: 2021-09-23 | Disposition: A | Discharge status: Home / Self Care | Payer: 59 | Source: Ambulatory Visit | Attending: Physician Assistant | Admitting: Physician Assistant

## 2021-09-23 ENCOUNTER — Other Ambulatory Visit: Payer: Self-pay

## 2021-09-23 ENCOUNTER — Ambulatory Visit: Admission: RE | Admit: 2021-09-23 | Payer: 59 | Source: Ambulatory Visit

## 2021-09-23 DIAGNOSIS — K111 Hypertrophy of salivary gland: Secondary | ICD-10-CM | POA: Insufficient documentation

## 2021-09-23 MED ORDER — IOHEXOL 300 MG/ML  SOLN: 75.0000 mL | Freq: Once | INTRAMUSCULAR | Status: DC | PRN

## 2021-09-23 MED ORDER — IOHEXOL 300 MG/ML  SOLN
75.0000 mL | Freq: Once | INTRAMUSCULAR | Status: AC | PRN
  Administered 2021-09-23: 75 mL via INTRAVENOUS

## 2021-09-25 ENCOUNTER — Other Ambulatory Visit: Payer: Self-pay | Admitting: Family Medicine

## 2021-09-25 MED ORDER — IRON (FERROUS SULFATE) 325 (65 FE) MG PO TABS: 325.0000 mg | ORAL_TABLET | Freq: Every day | ORAL | 3 refills | Status: DC

## 2021-09-29 ENCOUNTER — Ambulatory Visit: Payer: 59 | Admitting: Family Medicine

## 2022-03-23 ENCOUNTER — Other Ambulatory Visit: Payer: Self-pay | Admitting: Family Medicine

## 2022-03-24 NOTE — Telephone Encounter (Signed)
Requested medication (s) are due for refill today:yes  Requested medication (s) are on the active medication list: yes    Last refill: Both meds   09/15/21  #90  1 refill  Future visit scheduled yes 03/25/22  Notes to clinic:Pt has appt tomorrow 03/25/22, please review.  Requested Prescriptions  Pending Prescriptions Disp Refills   rosuvastatin (CRESTOR) 5 MG tablet [Pharmacy Med Name: Rosuvastatin Calcium 5 MG Oral Tablet] 45 tablet 0    Sig: TAKE 1 TABLET BY MOUTH EVERY OTHER DAY     Cardiovascular:  Antilipid - Statins 2 Failed - 03/23/2022 10:47 AM      Failed - Lipid Panel in normal range within the last 12 months    Cholesterol, Total  Date Value Ref Range Status  09/15/2021 167 100 - 199 mg/dL Final   LDL Chol Calc (NIH)  Date Value Ref Range Status  09/15/2021 88 0 - 99 mg/dL Final   HDL  Date Value Ref Range Status  09/15/2021 56 >39 mg/dL Final   Triglycerides  Date Value Ref Range Status  09/15/2021 128 0 - 149 mg/dL Final         Passed - Cr in normal range and within 360 days    Creatinine  Date Value Ref Range Status  08/30/2013 0.98 0.60 - 1.30 mg/dL Final   Creatinine, Ser  Date Value Ref Range Status  09/15/2021 0.86 0.57 - 1.00 mg/dL Final         Passed - Patient is not pregnant      Passed - Valid encounter within last 12 months    Recent Outpatient Visits           6 months ago Parotid gland enlargement   [facility] Mecum, Erin E, PA-C   6 months ago Coronary artery disease involving native coronary artery of native heart, unspecified whether angina present   [place], Megan P, DO   7 months ago Parotid gland enlargement   [facility] Vigg, Avanti, MD   9 months ago Chronic obstructive pulmonary disease with acute exacerbation ([place])   [place], Megan P, DO   10 months ago Exposure to the flu   [employer], [place], DO       Future  Appointments             Tomorrow Wynetta Emery, Barb Merino, DO [place], [facility]   In 1 week Johnson, Megan P, DO [place], [facility]             hydrOXYzine (ATARAX) 25 MG tablet [employer] Med Name: hydrOXYzine HCl 25 MG Oral Tablet] 90 tablet 0    Sig: Take 1 tablet by mouth three times daily as needed     Ear, Nose, and Throat:  Antihistamines 2 Passed - 03/23/2022 10:47 AM      Passed - Cr in normal range and within 360 days    Creatinine  Date Value Ref Range Status  08/30/2013 0.98 0.60 - 1.30 mg/dL Final   Creatinine, Ser  Date Value Ref Range Status  09/15/2021 0.86 0.57 - 1.00 mg/dL Final         Passed - Valid encounter within last 12 months    Recent Outpatient Visits           6 months ago Parotid gland enlargement   [place], Erin E, PA-C   6 months ago Coronary artery disease involving native coronary artery  of native heart, unspecified whether angina present   [facility], Megan P, DO   7 months ago Parotid gland enlargement   [facility] Vigg, Avanti, MD   9 months ago Chronic obstructive pulmonary disease with acute exacerbation [facility])   [place], Megan P, DO   10 months ago Exposure to the flu   [place], Barb Merino, DO       Future Appointments             Tomorrow Valerie Roys, DO [place], [place]   In 1 week Wynetta Emery, Barb Merino, DO [employer], [facility]

## 2022-03-25 ENCOUNTER — Encounter: Payer: Self-pay | Admitting: Family Medicine

## 2022-03-25 ENCOUNTER — Ambulatory Visit (INDEPENDENT_AMBULATORY_CARE_PROVIDER_SITE_OTHER): Payer: 59 | Admitting: Family Medicine

## 2022-03-25 VITALS — BP 170/104 | HR 72 | Temp 98.1°F | Wt 233.9 lb

## 2022-03-25 DIAGNOSIS — I129 Hypertensive chronic kidney disease with stage 1 through stage 4 chronic kidney disease, or unspecified chronic kidney disease: Secondary | ICD-10-CM

## 2022-03-25 DIAGNOSIS — M47896 Other spondylosis, lumbar region: Secondary | ICD-10-CM | POA: Diagnosis not present

## 2022-03-25 DIAGNOSIS — R69 Illness, unspecified: Secondary | ICD-10-CM | POA: Diagnosis not present

## 2022-03-25 DIAGNOSIS — K219 Gastro-esophageal reflux disease without esophagitis: Secondary | ICD-10-CM | POA: Diagnosis not present

## 2022-03-25 DIAGNOSIS — F332 Major depressive disorder, recurrent severe without psychotic features: Secondary | ICD-10-CM

## 2022-03-25 DIAGNOSIS — I2510 Atherosclerotic heart disease of native coronary artery without angina pectoris: Secondary | ICD-10-CM

## 2022-03-25 DIAGNOSIS — Z833 Family history of diabetes mellitus: Secondary | ICD-10-CM | POA: Diagnosis not present

## 2022-03-25 DIAGNOSIS — Z23 Encounter for immunization: Secondary | ICD-10-CM

## 2022-03-25 DIAGNOSIS — E782 Mixed hyperlipidemia: Secondary | ICD-10-CM | POA: Diagnosis not present

## 2022-03-25 DIAGNOSIS — D649 Anemia, unspecified: Secondary | ICD-10-CM

## 2022-03-25 DIAGNOSIS — K111 Hypertrophy of salivary gland: Secondary | ICD-10-CM

## 2022-03-25 DIAGNOSIS — R300 Dysuria: Secondary | ICD-10-CM

## 2022-03-25 DIAGNOSIS — E559 Vitamin D deficiency, unspecified: Secondary | ICD-10-CM

## 2022-03-25 LAB — URINALYSIS, ROUTINE W REFLEX MICROSCOPIC
Bilirubin, UA: NEGATIVE
Glucose, UA: NEGATIVE
Ketones, UA: NEGATIVE
Leukocytes,UA: NEGATIVE
Nitrite, UA: NEGATIVE
Protein,UA: NEGATIVE
RBC, UA: NEGATIVE
Specific Gravity, UA: 1.02 (ref 1.005–1.030)
Urobilinogen, Ur: 0.2 mg/dL (ref 0.2–1.0)
pH, UA: 7.5 (ref 5.0–7.5)

## 2022-03-25 LAB — BAYER DCA HB A1C WAIVED: HB A1C (BAYER DCA - WAIVED): 5.5 % (ref 4.8–5.6)

## 2022-03-25 LAB — MICROALBUMIN, URINE WAIVED
Creatinine, Urine Waived: 50 mg/dL (ref 10–300)
Microalb, Ur Waived: 10 mg/L (ref 0–19)
Microalb/Creat Ratio: <30 mg/g (ref <30)

## 2022-03-25 MED ORDER — ROSUVASTATIN CALCIUM 5 MG PO TABS: 5.0000 mg | ORAL_TABLET | ORAL | 1 refills | Status: DC

## 2022-03-25 MED ORDER — FLUOXETINE HCL 40 MG PO CAPS: 80.0000 mg | ORAL_CAPSULE | Freq: Every day | ORAL | 1 refills | Status: DC

## 2022-03-25 MED ORDER — HYDROXYZINE HCL 25 MG PO TABS: 25.0000 mg | ORAL_TABLET | Freq: Three times a day (TID) | ORAL | 1 refills | Status: DC | PRN

## 2022-03-25 MED ORDER — BUSPIRONE HCL 15 MG PO TABS: 15.0000 mg | ORAL_TABLET | Freq: Three times a day (TID) | ORAL | 1 refills | Status: DC

## 2022-03-25 MED ORDER — NAPROXEN 500 MG PO TABS: 500.0000 mg | ORAL_TABLET | Freq: Two times a day (BID) | ORAL | 1 refills | Status: DC

## 2022-03-25 MED ORDER — PANTOPRAZOLE SODIUM 40 MG PO TBEC: 40.0000 mg | DELAYED_RELEASE_TABLET | Freq: Two times a day (BID) | ORAL | 1 refills | Status: DC

## 2022-03-25 MED ORDER — ATENOLOL 50 MG PO TABS: 50.0000 mg | ORAL_TABLET | Freq: Every day | ORAL | 1 refills | Status: DC

## 2022-03-25 MED ORDER — QUETIAPINE FUMARATE 25 MG PO TABS: 25.0000 mg | ORAL_TABLET | Freq: Every day | ORAL | 1 refills | Status: DC

## 2022-03-25 MED ORDER — EZETIMIBE 10 MG PO TABS
10.0000 mg | ORAL_TABLET | Freq: Every day | ORAL | 1 refills | Status: DC
Start: 2022-03-25 — End: 2022-10-06

## 2022-03-25 MED ORDER — ISOSORBIDE MONONITRATE ER 30 MG PO TB24
30.0000 mg | ORAL_TABLET | Freq: Every day | ORAL | 1 refills | Status: DC
Start: 2022-03-25 — End: 2022-10-06

## 2022-03-25 MED ORDER — VALACYCLOVIR HCL 1 G PO TABS: 1000.0000 mg | ORAL_TABLET | Freq: Every day | ORAL | 1 refills | Status: DC

## 2022-03-25 MED ORDER — LOSARTAN POTASSIUM 100 MG PO TABS
100.0000 mg | ORAL_TABLET | Freq: Every day | ORAL | 1 refills | Status: DC
Start: 2022-03-25 — End: 2022-10-06

## 2022-03-25 MED ORDER — FUROSEMIDE 20 MG PO TABS: ORAL_TABLET | ORAL | 0 refills | Status: DC

## 2022-03-25 MED ORDER — AMLODIPINE BESYLATE 10 MG PO TABS
10.0000 mg | ORAL_TABLET | Freq: Every day | ORAL | 1 refills | Status: DC
Start: 2022-03-25 — End: 2022-10-06

## 2022-03-25 MED ORDER — ALBUTEROL SULFATE HFA 108 (90 BASE) MCG/ACT IN AERS: 2.0000 | INHALATION_SPRAY | Freq: Four times a day (QID) | RESPIRATORY_TRACT | 6 refills | Status: DC | PRN

## 2022-03-25 MED ORDER — BACLOFEN 10 MG PO TABS: 10.0000 mg | ORAL_TABLET | Freq: Every evening | ORAL | 0 refills | Status: DC | PRN

## 2022-03-25 NOTE — Assessment & Plan Note (Signed)
Did not take her medicine today. Will take her medicine. Labs drawn today. Await results. Treat as needed.

## 2022-03-25 NOTE — Assessment & Plan Note (Signed)
Will keep BP and cholesterol under good control. Continue to monitor. Call with any concerns.  

## 2022-03-25 NOTE — Assessment & Plan Note (Signed)
Has not seen ENT. Referral placed again today. Call with any concerns.

## 2022-03-25 NOTE — Progress Notes (Signed)
BP (!) 170/104   Pulse 72   Temp 98.1 F (36.7 C)   Wt 233 lb 14.4 oz (106.1 kg)   SpO2 97%   BMI 40.13 kg/m    Subjective:    Patient ID: Hannah Kaiser, female    DOB: 1960/06/21, 61 y.o.   MRN: [id number]  HPI: Hannah Kaiser is a 61 y.o. female  Chief Complaint  Patient presents with   Back Pain    Patient states her back has been hurting, worse in the lower back but hurts all over.    Muscle Pain    Patient states the back of her legs hurt daily, several times a day. Has been taking aleeve with some relief but not completely.    Numbness    Patient states she gets numbness from one arm to the other through the center of the chest, patient states this has been going on for years.    Urinary Frequency    Patient states for the past month she has been waking up 2-3 times during the night to urinate, patient states that is not usual for her.    BACK PAIN Duration: 3-4 months Mechanism of injury: unknown Location: bilateral and low back Onset: sudden Severity: 8/10 Quality: spasm, someone pushing Frequency: constant Radiation: R leg above the knee and L leg above the knee Aggravating factors: nothing Alleviating factors: aleve Status: worse Treatments attempted: aleve  Relief with NSAIDs?: mild Nighttime pain:  yes Paresthesias / decreased sensation:  yes Bowel / bladder incontinence:  no Fevers:  no Dysuria / urinary frequency:  no  HYPERTENSION / HYPERLIPIDEMIA- did not take her medicine today Satisfied with current treatment? yes Duration of hypertension: chronic BP monitoring frequency: not checking BP medication side effects: no Duration of hyperlipidemia: chronic Cholesterol medication side effects: no Cholesterol supplements: none Past cholesterol medications: zetia Medication compliance: fair compliance Aspirin: no Recent stressors: no Recurrent headaches: no Visual changes: no Palpitations: no Dyspnea: no Chest pain: no Lower extremity edema:  no Dizzy/lightheaded: no  DEPRESSION Mood status: stable Satisfied with current treatment?:  unsure Symptom severity: moderate  Duration of current treatment : chronic Side effects: no Medication compliance: fair compliance Psychotherapy/counseling: no  Previous psychiatric medications: prozac, seroquel (has not been taking) Depressed mood: yes Anxious mood: yes Anhedonia: no Significant weight loss or gain: no Insomnia: yes hard to fall asleep Fatigue: yes Feelings of worthlessness or guilt: yes Impaired concentration/indecisiveness: yes Suicidal ideations: no Hopelessness: no Crying spells: yes    03/25/2022    8:50 AM 09/18/2021    3:20 PM 09/15/2021    3:47 PM 08/19/2021    3:29 PM 04/28/2021   10:21 AM  Depression screen PHQ 2/9  Decreased Interest 2 0 0 0 0  Down, Depressed, Hopeless 1 0 0 0 0  PHQ - 2 Score 3 0 0 0 0  Altered sleeping '3 3 3 1 1  '$ Tired, decreased energy '3 1 3 2 3  '$ Change in appetite '3 3 3 2 '$ 0  Feeling bad or failure about yourself  2 1 0 0 0  Trouble concentrating 2 1 0 2 0  Moving slowly or fidgety/restless '1 1 1 2 '$ 0  Suicidal thoughts 0 0 0 0 0  PHQ-9 Score '17 10 10 9 4  '$ Difficult doing work/chores Somewhat difficult Not difficult at all Somewhat difficult Not difficult at all     Relevant past medical, surgical, family and social history reviewed and updated as indicated. Interim medical  history since our last visit reviewed. Allergies and medications reviewed and updated.  Review of Systems  Constitutional: Negative.   Respiratory: Negative.    Cardiovascular: Negative.   Gastrointestinal: Negative.   Musculoskeletal:  Positive for back pain and myalgias. Negative for arthralgias, gait problem, joint swelling, neck pain and neck stiffness.  Skin: Negative.   Neurological:  Positive for weakness and numbness. Negative for dizziness, tremors, seizures, syncope, facial asymmetry, speech difficulty, light-headedness and headaches.   Psychiatric/Behavioral: Negative.      Per HPI unless specifically indicated above     Objective:    BP (!) 170/104   Pulse 72   Temp 98.1 F (36.7 C)   Wt 233 lb 14.4 oz (106.1 kg)   SpO2 97%   BMI 40.13 kg/m   Wt Readings from Last 3 Encounters:  03/25/22 233 lb 14.4 oz (106.1 kg)  09/18/21 239 lb (108.4 kg)  09/15/21 239 lb 12.8 oz (108.8 kg)    Physical Exam Vitals and nursing note reviewed.  Constitutional:      General: She is not in acute distress.    Appearance: Normal appearance. She is obese. She is not ill-appearing, toxic-appearing or diaphoretic.  HENT:     Head: Normocephalic and atraumatic.     Right Ear: External ear normal.     Left Ear: External ear normal.     Nose: Nose normal.     Mouth/Throat:     Mouth: Mucous membranes are moist.     Pharynx: Oropharynx is clear.  Eyes:     General: No scleral icterus.       Right eye: No discharge.        Left eye: No discharge.     Extraocular Movements: Extraocular movements intact.     Conjunctiva/sclera: Conjunctivae normal.     Pupils: Pupils are equal, round, and reactive to light.  Cardiovascular:     Rate and Rhythm: Normal rate and regular rhythm.     Pulses: Normal pulses.     Heart sounds: Normal heart sounds. No murmur heard.    No friction rub. No gallop.  Pulmonary:     Effort: Pulmonary effort is normal. No respiratory distress.     Breath sounds: Normal breath sounds. No stridor. No wheezing, rhonchi or rales.  Chest:     Chest wall: No tenderness.  Musculoskeletal:        General: Normal range of motion.     Cervical back: Normal range of motion and neck supple.  Skin:    General: Skin is warm and dry.     Capillary Refill: Capillary refill takes less than 2 seconds.     Coloration: Skin is not jaundiced or pale.     Findings: No bruising, erythema, lesion or rash.  Neurological:     General: No focal deficit present.     Mental Status: She is alert and oriented to person, place,  and time. Mental status is at baseline.  Psychiatric:        Mood and Affect: Mood normal.        Behavior: Behavior normal.        Thought Content: Thought content normal.        Judgment: Judgment normal.     Results for orders placed or performed in visit on 03/25/22  Urinalysis, Routine w reflex microscopic  Result Value Ref Range   Specific Gravity, UA 1.020 1.005 - 1.030   pH, UA 7.5 5.0 - 7.5   Color, UA  Yellow Yellow   Appearance Ur Cloudy (A) Clear   Leukocytes,UA Negative Negative   Protein,UA Negative Negative/Trace   Glucose, UA Negative Negative   Ketones, UA Negative Negative   RBC, UA Negative Negative   Bilirubin, UA Negative Negative   Urobilinogen, Ur 0.2 0.2 - 1.0 mg/dL   Nitrite, UA Negative Negative  Microalbumin, Urine Waived  Result Value Ref Range   Microalb, Ur Waived 10 0 - 19 mg/L   Creatinine, Urine Waived 50 10 - 300 mg/dL   Microalb/Creat Ratio <30 <30 mg/g  Bayer DCA Hb A1c Waived  Result Value Ref Range   HB A1C (BAYER DCA - WAIVED) 5.5 4.8 - 5.6 %      Assessment & Plan:   Problem List Items Addressed This Visit       Cardiovascular and Mediastinum   CAD (coronary artery disease)    Will keep BP and cholesterol under good control. Continue to monitor. Call with any concerns.       Relevant Medications   rosuvastatin (CRESTOR) 5 MG tablet   losartan (COZAAR) 100 MG tablet   isosorbide mononitrate (IMDUR) 30 MG 24 hr tablet   furosemide (LASIX) 20 MG tablet   ezetimibe (ZETIA) 10 MG tablet   atenolol (TENORMIN) 50 MG tablet   amLODipine (NORVASC) 10 MG tablet   Other Relevant Orders   Comprehensive metabolic panel   Lipid Panel w/o Chol/HDL Ratio     Digestive   GERD (gastroesophageal reflux disease)    Under good control on current regimen. Continue current regimen. Continue to monitor. Call with any concerns. Refills given. Labs drawn today.        Relevant Medications   pantoprazole (PROTONIX) 40 MG tablet   Parotid  gland enlargement    Has not seen ENT. Referral placed again today. Call with any concerns.       Relevant Orders   Ambulatory referral to ENT     Musculoskeletal and Integument   Spinal osteoarthritis    Will get x-ray. Await results. Treat as needed. Baclofen sent in today.      Relevant Medications   naproxen (NAPROSYN) 500 MG tablet   baclofen (LIORESAL) 10 MG tablet   Other Relevant Orders   DG Lumbar Spine Complete     Genitourinary   Benign hypertensive renal disease    Did not take her medicine today. Will take her medicine. Labs drawn today. Await results. Treat as needed.       Relevant Orders   TSH   Microalbumin, Urine Waived (Completed)     Other   Hyperlipidemia    Under good control on current regimen. Continue current regimen. Continue to monitor. Call with any concerns. Refills given. Labs drawn today.        Relevant Medications   rosuvastatin (CRESTOR) 5 MG tablet   losartan (COZAAR) 100 MG tablet   isosorbide mononitrate (IMDUR) 30 MG 24 hr tablet   furosemide (LASIX) 20 MG tablet   ezetimibe (ZETIA) 10 MG tablet   atenolol (TENORMIN) 50 MG tablet   amLODipine (NORVASC) 10 MG tablet   Other Relevant Orders   Comprehensive metabolic panel   Lipid Panel w/o Chol/HDL Ratio   Severe episode of recurrent major depressive disorder, without psychotic features ([place]) - Primary    Not doing great. Has not been taking her seroquel. Will restart and recheck 1 month. Call with any concerns.       Relevant Medications   hydrOXYzine (ATARAX) 25 MG tablet  FLUoxetine (PROZAC) 40 MG capsule   busPIRone (BUSPAR) 15 MG tablet   Other Visit Diagnoses     Dysuria       Checking UA today. Await results.    Relevant Orders   Urinalysis, Routine w reflex microscopic (Completed)   Anemia, unspecified type       Labs drawn today. Await results. Treat as needed.    Relevant Orders   CBC with Differential/Platelet   Iron Binding Cap (TIBC)([facility])    Ferritin   Vitamin D deficiency       Labs drawn today. Await results. Treat as needed.    Relevant Orders   VITAMIN D 25 Hydroxy (Vit-D Deficiency, Fractures)   Family history of diabetes mellitus       Labs drawn today. Await results. Treat as needed.    Relevant Orders   Bayer DCA Hb A1c Waived (Completed)   Need for influenza vaccination       Flu shot given today.   Relevant Orders   Flu Vaccine QUAD 6+ mos PF IM (Fluarix Quad PF)        Follow up plan: Return 3-4 weeks physical.

## 2022-03-25 NOTE — Assessment & Plan Note (Signed)
Will get x-ray. Await results. Treat as needed. Baclofen sent in today.

## 2022-03-25 NOTE — Assessment & Plan Note (Signed)
Not doing great. Has not been taking her seroquel. Will restart and recheck 1 month. Call with any concerns.

## 2022-03-25 NOTE — Assessment & Plan Note (Signed)
Under good control on current regimen. Continue current regimen. Continue to monitor. Call with any concerns. Refills given. Labs drawn today.   

## 2022-03-26 ENCOUNTER — Other Ambulatory Visit: Payer: Self-pay | Admitting: Family Medicine

## 2022-03-26 LAB — CBC WITH DIFFERENTIAL/PLATELET
Basophils Absolute: 0 10*3/uL (ref 0.0–0.2)
Basos: 1 %
EOS (ABSOLUTE): 0.1 10*3/uL (ref 0.0–0.4)
Eos: 3 %
Hematocrit: 33.7 % — ABNORMAL LOW (ref 34.0–46.6)
Hemoglobin: 10.7 g/dL — ABNORMAL LOW (ref 11.1–15.9)
Immature Grans (Abs): 0 10*3/uL (ref 0.0–0.1)
Immature Granulocytes: 0 %
Lymphocytes Absolute: 1.2 10*3/uL (ref 0.7–3.1)
Lymphs: 26 %
MCH: 25.4 pg — ABNORMAL LOW (ref 26.6–33.0)
MCHC: 31.8 g/dL (ref 31.5–35.7)
MCV: 80 fL (ref 79–97)
Monocytes Absolute: 0.4 10*3/uL (ref 0.1–0.9)
Monocytes: 8 %
Neutrophils Absolute: 3 10*3/uL (ref 1.4–7.0)
Neutrophils: 62 %
Platelets: 254 10*3/uL (ref 150–450)
RBC: 4.22 x10E6/uL (ref 3.77–5.28)
RDW: 15.8 % — ABNORMAL HIGH (ref 11.7–15.4)
WBC: 4.8 10*3/uL (ref 3.4–10.8)

## 2022-03-26 LAB — COMPREHENSIVE METABOLIC PANEL
ALT: 12 IU/L (ref 0–32)
AST: 16 IU/L (ref 0–40)
Albumin/Globulin Ratio: 1.9 (ref 1.2–2.2)
Albumin: 4.5 g/dL (ref 3.8–4.9)
Alkaline Phosphatase: 76 IU/L (ref 44–121)
BUN/Creatinine Ratio: 19 (ref 9–23)
BUN: 16 mg/dL (ref 6–24)
Bilirubin Total: 0.4 mg/dL (ref 0.0–1.2)
CO2: 22 mmol/L (ref 20–29)
Calcium: 9.3 mg/dL (ref 8.7–10.2)
Chloride: 106 mmol/L (ref 96–106)
Creatinine, Ser: 0.83 mg/dL (ref 0.57–1.00)
Globulin, Total: 2.4 g/dL (ref 1.5–4.5)
Glucose: 93 mg/dL (ref 70–99)
Potassium: 4.3 mmol/L (ref 3.5–5.2)
Sodium: 141 mmol/L (ref 134–144)
Total Protein: 6.9 g/dL (ref 6.0–8.5)
eGFR: 81 mL/min/{1.73_m2} (ref >59)

## 2022-03-26 LAB — LIPID PANEL W/O CHOL/HDL RATIO
Cholesterol, Total: 224 mg/dL — ABNORMAL HIGH (ref 100–199)
HDL: 49 mg/dL (ref >39)
LDL Chol Calc (NIH): 155 mg/dL — ABNORMAL HIGH (ref 0–99)
Triglycerides: 112 mg/dL (ref 0–149)
VLDL Cholesterol Cal: 20 mg/dL (ref 5–40)

## 2022-03-26 LAB — IRON AND TIBC
Iron Saturation: 10 % — ABNORMAL LOW (ref 15–55)
Iron: 38 ug/dL (ref 27–159)
Total Iron Binding Capacity: 371 ug/dL (ref 250–450)
UIBC: 333 ug/dL (ref 131–425)

## 2022-03-26 LAB — FERRITIN: Ferritin: 12 ng/mL — ABNORMAL LOW (ref 15–150)

## 2022-03-26 LAB — TSH: TSH: 0.741 u[IU]/mL (ref 0.450–4.500)

## 2022-03-26 LAB — VITAMIN D 25 HYDROXY (VIT D DEFICIENCY, FRACTURES): Vit D, 25-Hydroxy: 21.8 ng/mL — ABNORMAL LOW (ref 30.0–100.0)

## 2022-03-26 MED ORDER — IRON (FERROUS SULFATE) 325 (65 FE) MG PO TABS: 325.0000 mg | ORAL_TABLET | Freq: Two times a day (BID) | ORAL | 3 refills | Status: DC

## 2022-04-06 ENCOUNTER — Encounter: Payer: Self-pay | Admitting: Family Medicine

## 2022-04-06 ENCOUNTER — Ambulatory Visit
Admission: RE | Admit: 2022-04-06 | Discharge: 2022-04-06 | Disposition: A | Discharge status: Home / Self Care | Payer: 59 | Source: Ambulatory Visit | Attending: Family Medicine | Admitting: Family Medicine

## 2022-04-06 ENCOUNTER — Ambulatory Visit
Admission: RE | Admit: 2022-04-06 | Discharge: 2022-04-06 | Disposition: A | Discharge status: Home / Self Care | Payer: 59 | Source: Home / Self Care | Attending: Family Medicine | Admitting: Family Medicine

## 2022-04-06 ENCOUNTER — Ambulatory Visit (INDEPENDENT_AMBULATORY_CARE_PROVIDER_SITE_OTHER): Payer: 59 | Admitting: Family Medicine

## 2022-04-06 ENCOUNTER — Encounter (INDEPENDENT_AMBULATORY_CARE_PROVIDER_SITE_OTHER): Payer: Self-pay

## 2022-04-06 VITALS — BP 120/71 | HR 54 | Temp 98.5°F | Ht 64.02 in | Wt 234.6 lb

## 2022-04-06 DIAGNOSIS — Z23 Encounter for immunization: Secondary | ICD-10-CM

## 2022-04-06 DIAGNOSIS — Z1283 Encounter for screening for malignant neoplasm of skin: Secondary | ICD-10-CM | POA: Diagnosis not present

## 2022-04-06 DIAGNOSIS — Z0000 Encounter for general adult medical examination without abnormal findings: Secondary | ICD-10-CM | POA: Diagnosis not present

## 2022-04-06 DIAGNOSIS — Z122 Encounter for screening for malignant neoplasm of respiratory organs: Secondary | ICD-10-CM | POA: Diagnosis not present

## 2022-04-06 DIAGNOSIS — Z720 Tobacco use: Secondary | ICD-10-CM

## 2022-04-06 DIAGNOSIS — Z1211 Encounter for screening for malignant neoplasm of colon: Secondary | ICD-10-CM

## 2022-04-06 DIAGNOSIS — F419 Anxiety disorder, unspecified: Secondary | ICD-10-CM | POA: Diagnosis not present

## 2022-04-06 DIAGNOSIS — R69 Illness, unspecified: Secondary | ICD-10-CM | POA: Diagnosis not present

## 2022-04-06 DIAGNOSIS — M47896 Other spondylosis, lumbar region: Secondary | ICD-10-CM | POA: Diagnosis not present

## 2022-04-06 DIAGNOSIS — Z1231 Encounter for screening mammogram for malignant neoplasm of breast: Secondary | ICD-10-CM | POA: Diagnosis not present

## 2022-04-06 MED ORDER — CLONAZEPAM 1 MG PO TABS: 1.0000 mg | ORAL_TABLET | Freq: Two times a day (BID) | ORAL | 0 refills | Status: DC | PRN

## 2022-04-06 NOTE — Assessment & Plan Note (Signed)
Under good control on current regimen. Continue current regimen. Continue to monitor. Call with any concerns. Refills given. Klonopin should last about 3-6 months.

## 2022-04-06 NOTE — Progress Notes (Signed)
BP 120/71   Pulse (!) 54   Temp 98.5 F (36.9 C) (Oral)   Ht 5' 4.02" (1.626 m)   Wt 234 lb 9.6 oz (106.4 kg)   SpO2 98%   BMI 40.25 kg/m    Subjective:    Patient ID: Hannah Kaiser, female    DOB: 1961/01/22, 61 y.o.   MRN: [id number]  HPI: Hannah Kaiser is a 61 y.o. female presenting on 04/06/2022 for comprehensive medical examination. Current medical complaints include:  ANXIETY/DEPRESSION- very occasionally Duration:chronic Status:stable Anxious mood: yes  Excessive worrying: yes Irritability: no  Sweating: no Nausea: no Palpitations:no Hyperventilation: no Panic attacks: no Agoraphobia: no  Obscessions/compulsions: no Depressed mood: no    04/06/2022    1:05 PM 03/25/2022    8:50 AM 09/18/2021    3:20 PM 09/15/2021    3:47 PM 08/19/2021    3:29 PM  Depression screen PHQ 2/9  Decreased Interest 0 2 0 0 0  Down, Depressed, Hopeless 0 1 0 0 0  PHQ - 2 Score 0 3 0 0 0  Altered sleeping _0 Tired, decreased energy _1 Change in appetite _2 Feeling bad or failure about yourself  0 2 1 0 0  Trouble concentrating 0 2 1 0 2  Moving slowly or fidgety/restless 0 _3 Suicidal thoughts 0 0 0 0 0  PHQ-9 Score _4 Difficult doing work/chores Somewhat difficult Somewhat difficult Not difficult at all Somewhat difficult Not difficult at all   Anhedonia: no Weight changes: no Insomnia: no   Hypersomnia: no Fatigue/loss of energy: no Feelings of worthlessness: no Feelings of guilt: no Impaired concentration/indecisiveness: no Suicidal ideations: no  Crying spells: no Recent Stressors/Life Changes: no   Relationship problems: no   Family stress: no     Financial stress: no    Job stress: no    Recent death/loss: no   She currently lives with: sister and brother in law Menopausal Symptoms: no  Depression Screen done today and results listed below:     04/06/2022    1:05 PM 03/25/2022    8:50 AM 09/18/2021    3:20 PM  09/15/2021    3:47 PM 08/19/2021    3:29 PM  Depression screen PHQ 2/9  Decreased Interest 0 2 0 0 0  Down, Depressed, Hopeless 0 1 0 0 0  PHQ - 2 Score 0 3 0 0 0  Altered sleeping _5 Tired, decreased energy _6 Change in appetite _7 Feeling bad or failure about yourself  0 2 1 0 0  Trouble concentrating 0 2 1 0 2  Moving slowly or fidgety/restless 0 _8 Suicidal thoughts 0 0 0 0 0  PHQ-9 Score _9 Difficult doing work/chores Somewhat difficult Somewhat difficult Not difficult at all Somewhat difficult Not difficult at all    Past Medical History:  Past Medical History:  Diagnosis Date   Anxiety    Arthritis    knees, thoracic spurs and arthritis   Asthma    Breast pain    CHF (congestive heart failure) (HCC)    Colon polyps    COPD (chronic obstructive pulmonary disease) (HCC)    Depression    Dysrhythmia    GERD (gastroesophageal reflux disease)  Glaucoma    Heart murmur    History of blood transfusion    Hypertension    Motion sickness    cars   Multilevel degenerative disc disease    Neuropathy    Bilateral arms and legs   Nipple discharge    Numbness of both lower extremities    bulging lumbar discs - per pt   Numbness of upper extremity    bilateral - degenerative cervical discs - per pt   Pneumonia    PONV (postoperative nausea and vomiting)    Sleep apnea    has CPAP   Smokers' cough ([place])    Swelling of limb 03/11/2021   Wears dentures    full upper and lower    Surgical History:  Past Surgical History:  Procedure Laterality Date   BACK SURGERY     BELPHAROPTOSIS REPAIR     BROW LIFT Bilateral 03/24/2016   Procedure: BLEPHAROPLASTY;  Surgeon: Karle Starch, MD;  Location: [facility];  Service: Ophthalmology;  Laterality: Bilateral;  sleep apnea   CARDIAC CATHETERIZATION     COLONOSCOPY WITH PROPOFOL N/A 10/19/2016   Procedure: COLONOSCOPY WITH PROPOFOL;  Surgeon: Lucilla Lame, MD;  Location: [facility];  Service: Endoscopy;  Laterality: N/A;   DILATION AND CURETTAGE OF UTERUS     ECTOPIC PREGNANCY SURGERY     x2   ESOPHAGOGASTRODUODENOSCOPY (EGD) WITH PROPOFOL N/A 10/19/2016   Procedure: ESOPHAGOGASTRODUODENOSCOPY (EGD) WITH PROPOFOL;  Surgeon: Lucilla Lame, MD;  Location: [facility];  Service: Endoscopy;  Laterality: N/A;   LEFT HEART CATH AND CORONARY ANGIOGRAPHY Left 12/29/2016   Procedure: Left Heart Cath and Coronary Angiography;  Surgeon: Dionisio David, MD;  Location: [facility];  Service: Cardiovascular;  Laterality: Left;   NECK SURGERY  2003   POLYPECTOMY  10/19/2016   Procedure: POLYPECTOMY;  Surgeon: Lucilla Lame, MD;  Location: [facility];  Service: Endoscopy;;   [place], 1998   TOTAL ABDOMINAL HYSTERECTOMY W/ BILATERAL SALPINGOOPHORECTOMY  2014   TUBAL LIGATION      Medications:  Current Outpatient Medications on File Prior to Visit  Medication Sig   albuterol (VENTOLIN HFA) 108 (90 Base) MCG/ACT inhaler Inhale 2 puffs into the lungs every 6 (six) hours as needed for wheezing or shortness of breath.   amLODipine (NORVASC) 10 MG tablet Take 1 tablet (10 mg total) by mouth daily.   atenolol (TENORMIN) 50 MG tablet Take 1 tablet (50 mg total) by mouth daily.   baclofen (LIORESAL) 10 MG tablet Take 1 tablet (10 mg total) by mouth at bedtime as needed for muscle spasms.   busPIRone (BUSPAR) 15 MG tablet Take 1 tablet (15 mg total) by mouth 3 (three) times daily.   ezetimibe (ZETIA) 10 MG tablet Take 1 tablet (10 mg total) by mouth daily.   FLUoxetine (PROZAC) 40 MG capsule Take 2 capsules (80 mg total) by mouth daily.   furosemide (LASIX) 20 MG tablet TAKE 1 TABLET BY MOUTH ONCE DAILY AS NEEDED FOR SWELLING   hydrOXYzine (ATARAX) 25 MG tablet Take 1 tablet (25 mg total) by mouth 3 (three) times daily as needed.   ibuprofen (ADVIL) 800 MG tablet Take 1 tablet (800 mg total) by mouth every 8 (eight) hours as needed.   Iron,  Ferrous Sulfate, 325 (65 Fe) MG TABS Take 325 mg by mouth 2 (two) times daily.   isosorbide mononitrate (IMDUR) 30 MG 24 hr tablet Take 1 tablet (30 mg total) by  mouth daily.   latanoprost (XALATAN) 0.005 % ophthalmic solution Place 1 drop into both eyes at bedtime.   losartan (COZAAR) 100 MG tablet Take 1 tablet (100 mg total) by mouth daily.   mupirocin ointment (BACTROBAN) 2 % Apply 1 application. topically 2 (two) times daily.   naproxen (NAPROSYN) 500 MG tablet Take 1 tablet (500 mg total) by mouth 2 (two) times daily with a meal.   nystatin-triamcinolone ointment (MYCOLOG) Apply 1 application topically 2 (two) times daily.   pantoprazole (PROTONIX) 40 MG tablet Take 1 tablet (40 mg total) by mouth 2 (two) times daily.   QUEtiapine (SEROQUEL) 25 MG tablet Take 1 tablet (25 mg total) by mouth at bedtime.   rosuvastatin (CRESTOR) 5 MG tablet Take 1 tablet (5 mg total) by mouth every other day.   triamcinolone ointment (KENALOG) 0.5 % Apply 1 application topically 2 (two) times daily.   valACYclovir (VALTREX) 1000 MG tablet Take 1 tablet (1,000 mg total) by mouth daily.   No current facility-administered medications on file prior to visit.    Allergies:  Allergies  Allergen Reactions   Abilify [Aripiprazole] Swelling    Whole  body swelling, retains fluid   Ace Inhibitors Cough    So bad she vomited Other reaction(s): Cough So bad she vomited Other reaction(s): Cough So bad she vomited    Wellbutrin [Bupropion] Other (See Comments)    Worsened depression   Paxlovid [Nirmatrelvir-Ritonavir] Other (See Comments)    Ulcers in mouth   Azithromycin Rash   Erythromycin Rash    Social History:  Social History   Socioeconomic History   Marital status: Legally Separated    Spouse name: Not on file   Number of children: Not on file   Years of education: Not on file   Highest education level: Not on file  Occupational History   Not on file  Tobacco Use   Smoking status: Every  Day    Packs/day: 1.50    Years: 41.00    Total pack years: 61.50    Types: Cigarettes   Smokeless tobacco: Never   Tobacco comments:    since age 37  Vaping Use   Vaping Use: Never used  Substance and Sexual Activity   Alcohol use: No    Alcohol/week: 0.0 standard drinks of alcohol   Drug use: No   Sexual activity: Not Currently    Birth control/protection: Surgical  Other Topics Concern   Not on file  Social History Narrative   Not on file   Social Determinants of Health   Financial Resource Strain: Not on file  Food Insecurity: Not on file  Transportation Needs: Not on file  Physical Activity: Not on file  Stress: Not on file  Social Connections: Not on file  Intimate Partner Violence: Not on file   Social History   Tobacco Use  Smoking Status Every Day   Packs/day: 1.50   Years: 41.00   Total pack years: 61.50   Types: Cigarettes  Smokeless Tobacco Never  Tobacco Comments   since age 27   Social History   Substance and Sexual Activity  Alcohol Use No   Alcohol/week: 0.0 standard drinks of alcohol    Family History:  Family History  Problem Relation Age of Onset   Cancer Brother        melanoma   Cancer Maternal Grandfather        prostate   Cancer Maternal Uncle        lung  Past medical history, surgical history, medications, allergies, family history and social history reviewed with patient today and changes made to appropriate areas of the chart.   Review of Systems  Constitutional: Negative.   HENT: Negative.    Eyes: Negative.        Trouble with vision being dark  Respiratory:  Positive for cough and shortness of breath (with exertion). Negative for hemoptysis, sputum production and wheezing.   Cardiovascular:  Positive for chest pain (with exertion) and leg swelling (R side- chronic). Negative for palpitations, orthopnea, claudication and PND.  Gastrointestinal:  Positive for diarrhea and heartburn. Negative for abdominal pain, blood  in stool, constipation, melena, nausea and vomiting.  Genitourinary: Negative.   Musculoskeletal:  Positive for back pain and myalgias (with exertion). Negative for falls, joint pain and neck pain.  Skin: Negative.   Neurological:  Positive for tingling and sensory change. Negative for dizziness, tremors, speech change, focal weakness, seizures, loss of consciousness, weakness and headaches.  Endo/Heme/Allergies:  Positive for environmental allergies and polydipsia. Bruises/bleeds easily.  Psychiatric/Behavioral:  Negative for depression, hallucinations, memory loss, substance abuse and suicidal ideas. The patient is nervous/anxious. The patient does not have insomnia.    All other ROS negative except what is listed above and in the HPI.      Objective:    BP 120/71   Pulse (!) 54   Temp 98.5 F (36.9 C) (Oral)   Ht 5' 4.02" (1.626 m)   Wt 234 lb 9.6 oz (106.4 kg)   SpO2 98%   BMI 40.25 kg/m   Wt Readings from Last 3 Encounters:  04/06/22 234 lb 9.6 oz (106.4 kg)  03/25/22 233 lb 14.4 oz (106.1 kg)  09/18/21 239 lb (108.4 kg)    Physical Exam Vitals and nursing note reviewed.  Constitutional:      General: She is not in acute distress.    Appearance: Normal appearance. She is obese. She is not ill-appearing, toxic-appearing or diaphoretic.  HENT:     Head: Normocephalic and atraumatic.     Right Ear: Tympanic membrane, ear canal and external ear normal. There is no impacted cerumen.     Left Ear: Tympanic membrane, ear canal and external ear normal. There is no impacted cerumen.     Nose: Nose normal. No congestion or rhinorrhea.     Mouth/Throat:     Mouth: Mucous membranes are moist.     Pharynx: Oropharynx is clear. No oropharyngeal exudate or posterior oropharyngeal erythema.  Eyes:     General: No scleral icterus.       Right eye: No discharge.        Left eye: No discharge.     Extraocular Movements: Extraocular movements intact.     Conjunctiva/sclera: Conjunctivae  normal.     Pupils: Pupils are equal, round, and reactive to light.  Neck:     Vascular: No carotid bruit.  Cardiovascular:     Rate and Rhythm: Normal rate and regular rhythm.     Pulses: Normal pulses.     Heart sounds: No murmur heard.    No friction rub. No gallop.  Pulmonary:     Effort: Pulmonary effort is normal. No respiratory distress.     Breath sounds: Normal breath sounds. No stridor. No wheezing, rhonchi or rales.  Chest:     Chest wall: No tenderness.  Abdominal:     General: Abdomen is flat. Bowel sounds are normal. There is no distension.     Palpations: Abdomen is  soft. There is no mass.     Tenderness: There is no abdominal tenderness. There is no right CVA tenderness, left CVA tenderness, guarding or rebound.     Hernia: No hernia is present.  Genitourinary:    Comments: Breast and pelvic exams deferred with shared decision making Musculoskeletal:        General: No swelling, tenderness, deformity or signs of injury. Normal range of motion.     Cervical back: Normal range of motion and neck supple. No rigidity. No muscular tenderness.     Right lower leg: No edema.     Left lower leg: No edema.  Lymphadenopathy:     Cervical: No cervical adenopathy.  Skin:    General: Skin is warm and dry.     Capillary Refill: Capillary refill takes less than 2 seconds.     Coloration: Skin is not jaundiced or pale.     Findings: No bruising, erythema, lesion or rash.  Neurological:     General: No focal deficit present.     Mental Status: She is alert and oriented to person, place, and time. Mental status is at baseline.     Cranial Nerves: No cranial nerve deficit.     Sensory: No sensory deficit.     Motor: No weakness.     Coordination: Coordination normal.     Gait: Gait normal.     Deep Tendon Reflexes: Reflexes normal.  Psychiatric:        Mood and Affect: Mood normal.        Behavior: Behavior normal.        Thought Content: Thought content normal.         Judgment: Judgment normal.     Results for orders placed or performed in visit on 03/25/22  CBC with Differential/Platelet  Result Value Ref Range   WBC 4.8 3.4 - 10.8 x10E3/uL   RBC 4.22 3.77 - 5.28 x10E6/uL   Hemoglobin 10.7 (L) 11.1 - 15.9 g/dL   Hematocrit 33.7 (L) 34.0 - 46.6 %   MCV 80 79 - 97 fL   MCH 25.4 (L) 26.6 - 33.0 pg   MCHC 31.8 31.5 - 35.7 g/dL   RDW 15.8 (H) 11.7 - 15.4 %   Platelets 254 150 - 450 x10E3/uL   Neutrophils 62 Not Estab. %   Lymphs 26 Not Estab. %   Monocytes 8 Not Estab. %   Eos 3 Not Estab. %   Basos 1 Not Estab. %   Neutrophils Absolute 3.0 1.4 - 7.0 x10E3/uL   Lymphocytes Absolute 1.2 0.7 - 3.1 x10E3/uL   Monocytes Absolute 0.4 0.1 - 0.9 x10E3/uL   EOS (ABSOLUTE) 0.1 0.0 - 0.4 x10E3/uL   Basophils Absolute 0.0 0.0 - 0.2 x10E3/uL   Immature Granulocytes 0 Not Estab. %   Immature Grans (Abs) 0.0 0.0 - 0.1 x10E3/uL  Comprehensive metabolic panel  Result Value Ref Range   Glucose 93 70 - 99 mg/dL   BUN 16 6 - 24 mg/dL   Creatinine, Ser 0.83 0.57 - 1.00 mg/dL   eGFR 81 >59 mL/min/1.73   BUN/Creatinine Ratio 19 9 - 23   Sodium 141 134 - 144 mmol/L   Potassium 4.3 3.5 - 5.2 mmol/L   Chloride 106 96 - 106 mmol/L   CO2 22 20 - 29 mmol/L   Calcium 9.3 8.7 - 10.2 mg/dL   Total Protein 6.9 6.0 - 8.5 g/dL   Albumin 4.5 3.8 - 4.9 g/dL   Globulin, Total 2.4 1.5 -  4.5 g/dL   Albumin/Globulin Ratio 1.9 1.2 - 2.2   Bilirubin Total 0.4 0.0 - 1.2 mg/dL   Alkaline Phosphatase 76 44 - 121 IU/L   AST 16 0 - 40 IU/L   ALT 12 0 - 32 IU/L  Lipid Panel w/o Chol/HDL Ratio  Result Value Ref Range   Cholesterol, Total 224 (H) 100 - 199 mg/dL   Triglycerides 112 0 - 149 mg/dL   HDL 49 >39 mg/dL   VLDL Cholesterol Cal 20 5 - 40 mg/dL   LDL Chol Calc (NIH) 155 (H) 0 - 99 mg/dL  Urinalysis, Routine w reflex microscopic  Result Value Ref Range   Specific Gravity, UA 1.020 1.005 - 1.030   pH, UA 7.5 5.0 - 7.5   Color, UA Yellow Yellow   Appearance Ur Cloudy  (A) Clear   Leukocytes,UA Negative Negative   Protein,UA Negative Negative/Trace   Glucose, UA Negative Negative   Ketones, UA Negative Negative   RBC, UA Negative Negative   Bilirubin, UA Negative Negative   Urobilinogen, Ur 0.2 0.2 - 1.0 mg/dL   Nitrite, UA Negative Negative  TSH  Result Value Ref Range   TSH 0.741 0.450 - 4.500 uIU/mL  Microalbumin, Urine Waived  Result Value Ref Range   Microalb, Ur Waived 10 0 - 19 mg/L   Creatinine, Urine Waived 50 10 - 300 mg/dL   Microalb/Creat Ratio <30 <30 mg/g  Bayer DCA Hb A1c Waived  Result Value Ref Range   HB A1C (BAYER DCA - WAIVED) 5.5 4.8 - 5.6 %  VITAMIN D 25 Hydroxy (Vit-D Deficiency, Fractures)  Result Value Ref Range   Vit D, 25-Hydroxy 21.8 (L) 30.0 - 100.0 ng/mL  Iron Binding Cap (TIBC)([facility])  Result Value Ref Range   Total Iron Binding Capacity 371 250 - 450 ug/dL   UIBC 333 131 - 425 ug/dL   Iron 38 27 - 159 ug/dL   Iron Saturation 10 (L) 15 - 55 %  Ferritin  Result Value Ref Range   Ferritin 12 (L) 15 - 150 ng/mL      Assessment & Plan:   Problem List Items Addressed This Visit       Other   Anxiety    Under good control on current regimen. Continue current regimen. Continue to monitor. Call with any concerns. Refills given. Klonopin should last about 3-6 months.        Other Visit Diagnoses     Routine general medical examination at a health care facility    -  Primary   Vaccines updated. Screening labs checked today. Pap N/A. Mammo, colonoscopy and low-dose CT ordered. Continue diet and exercise. Call with any concerns.    Tobacco abuse       Not ready to quit. Low-dose CT ordered. Await results.    Relevant Orders   CT CHEST LUNG CA SCREEN LOW DOSE W/O CM   Screening for skin cancer       Referral to dermatology made.    Relevant Orders   Ambulatory referral to Dermatology   Encounter for screening mammogram for malignant neoplasm of breast       Mammo ordered and scheduled.    Relevant Orders   MM 3D SCREEN BREAST BILATERAL   Screening for colon cancer       Referral to GI made.    Relevant Orders   Ambulatory referral to Gastroenterology   Screening for lung cancer       Low-dose CT ordered. Await results.  Relevant Orders   CT CHEST LUNG CA SCREEN LOW DOSE W/O CM        Follow up plan: Return in about 6 months (around 10/06/2022) for with me, 3 months nurse only for shingrix #2.   LABORATORY TESTING:  - Pap smear: not applicable  IMMUNIZATIONS:   - Tdap: Tetanus vaccination status reviewed: last tetanus booster within 10 years. - Influenza: Up to date - Pneumovax: Up to date - Prevnar: Not applicable - COVID: Up to date - HPV: Not applicable - Shingrix vaccine: Administered today  SCREENING: -Mammogram: Ordered today  - Colonoscopy: Ordered today   PATIENT COUNSELING:   Advised to take 1 mg of folate supplement per day if capable of pregnancy.   Sexuality: Discussed sexually transmitted diseases, partner selection, use of condoms, avoidance of unintended pregnancy  and contraceptive alternatives.   Advised to avoid cigarette smoking.  I discussed with the patient that most people either abstain from alcohol or drink within safe limits (<=14/week and <=4 drinks/occasion for males, <=7/weeks and <= 3 drinks/occasion for females) and that the risk for alcohol disorders and other health effects rises proportionally with the number of drinks per week and how often a drinker exceeds daily limits.  Discussed cessation/primary prevention of drug use and availability of treatment for abuse.   Diet: Encouraged to adjust caloric intake to maintain  or achieve ideal body weight, to reduce intake of dietary saturated fat and total fat, to limit sodium intake by avoiding high sodium foods and not adding table salt, and to maintain adequate dietary potassium and calcium preferably from fresh fruits, vegetables, and low-fat dairy products.    stressed the  importance of regular exercise  Injury prevention: Discussed safety belts, safety helmets, smoke detector, smoking near bedding or upholstery.   Dental health: Discussed importance of regular tooth brushing, flossing, and dental visits.    NEXT PREVENTATIVE PHYSICAL DUE IN 1 YEAR. Return in about 6 months (around 10/06/2022) for with me, 3 months nurse only for shingrix #2.

## 2022-04-07 ENCOUNTER — Telehealth: Payer: Self-pay

## 2022-04-07 ENCOUNTER — Other Ambulatory Visit: Payer: Self-pay

## 2022-04-07 DIAGNOSIS — Z86010 Personal history of colonic polyps: Secondary | ICD-10-CM

## 2022-04-07 MED ORDER — NA SULFATE-K SULFATE-MG SULF 17.5-3.13-1.6 GM/177ML PO SOLN: 1.0000 | Freq: Once | ORAL | 0 refills | Status: AC

## 2022-04-07 NOTE — Telephone Encounter (Signed)
Gastroenterology Pre-Procedure Review  Request Date: 05/12/22 Requesting Physician: Dr. Allen Norris  PATIENT REVIEW QUESTIONS: The patient responded to the following health history questions as indicated:    1. Are you having any GI issues? no 2. Do you have a personal history of Polyps? yes (last colonoscopy was 2018 with Dr. Allen Norris) 3. Do you have a family history of Colon Cancer or Polyps?  Unsure about cancer but there is a history of colon polyps 4. Diabetes Mellitus? no 5. Joint replacements in the past 12 months?no 6. Major health problems in the past 3 months?no 7. Any artificial heart valves, MVP, or defibrillator?no    MEDICATIONS & ALLERGIES:    Patient reports the following regarding taking any anticoagulation/antiplatelet therapy:   Plavix, Coumadin, Eliquis, Xarelto, Lovenox, Pradaxa, Brilinta, or Effient? no Aspirin? no  Patient confirms/reports the following medications:  Current Outpatient Medications  Medication Sig Dispense Refill   albuterol (VENTOLIN HFA) 108 (90 Base) MCG/ACT inhaler Inhale 2 puffs into the lungs every 6 (six) hours as needed for wheezing or shortness of breath. 8 g 6   amLODipine (NORVASC) 10 MG tablet Take 1 tablet (10 mg total) by mouth daily. 90 tablet 1   atenolol (TENORMIN) 50 MG tablet Take 1 tablet (50 mg total) by mouth daily. 90 tablet 1   baclofen (LIORESAL) 10 MG tablet Take 1 tablet (10 mg total) by mouth at bedtime as needed for muscle spasms. 30 each 0   busPIRone (BUSPAR) 15 MG tablet Take 1 tablet (15 mg total) by mouth 3 (three) times daily. 270 tablet 1   clonazePAM (KLONOPIN) 1 MG tablet Take 1 tablet (1 mg total) by mouth 2 (two) times daily as needed. for anxiety 30 tablet 0   ezetimibe (ZETIA) 10 MG tablet Take 1 tablet (10 mg total) by mouth daily. 90 tablet 1   FLUoxetine (PROZAC) 40 MG capsule Take 2 capsules (80 mg total) by mouth daily. 180 capsule 1   furosemide (LASIX) 20 MG tablet TAKE 1 TABLET BY MOUTH ONCE DAILY AS NEEDED  FOR SWELLING 60 tablet 0   hydrOXYzine (ATARAX) 25 MG tablet Take 1 tablet (25 mg total) by mouth 3 (three) times daily as needed. 90 tablet 1   ibuprofen (ADVIL) 800 MG tablet Take 1 tablet (800 mg total) by mouth every 8 (eight) hours as needed. 270 tablet 1   Iron, Ferrous Sulfate, 325 (65 Fe) MG TABS Take 325 mg by mouth 2 (two) times daily. 60 tablet 3   isosorbide mononitrate (IMDUR) 30 MG 24 hr tablet Take 1 tablet (30 mg total) by mouth daily. 90 tablet 1   latanoprost (XALATAN) 0.005 % ophthalmic solution Place 1 drop into both eyes at bedtime.     losartan (COZAAR) 100 MG tablet Take 1 tablet (100 mg total) by mouth daily. 90 tablet 1   mupirocin ointment (BACTROBAN) 2 % Apply 1 application. topically 2 (two) times daily. 22 g 0   naproxen (NAPROSYN) 500 MG tablet Take 1 tablet (500 mg total) by mouth 2 (two) times daily with a meal. 90 tablet 1   nystatin-triamcinolone ointment (MYCOLOG) Apply 1 application topically 2 (two) times daily. 30 g 1   pantoprazole (PROTONIX) 40 MG tablet Take 1 tablet (40 mg total) by mouth 2 (two) times daily. 180 tablet 1   QUEtiapine (SEROQUEL) 25 MG tablet Take 1 tablet (25 mg total) by mouth at bedtime. 90 tablet 1   rosuvastatin (CRESTOR) 5 MG tablet Take 1 tablet (5 mg total) by  mouth every other day. 90 tablet 1   triamcinolone ointment (KENALOG) 0.5 % Apply 1 application topically 2 (two) times daily. 30 g 0   valACYclovir (VALTREX) 1000 MG tablet Take 1 tablet (1,000 mg total) by mouth daily. 180 tablet 1   No current facility-administered medications for this visit.    Patient confirms/reports the following allergies:  Allergies  Allergen Reactions   Abilify [Aripiprazole] Swelling    Whole  body swelling, retains fluid   Ace Inhibitors Cough    So bad she vomited Other reaction(s): Cough So bad she vomited Other reaction(s): Cough So bad she vomited    Wellbutrin [Bupropion] Other (See Comments)    Worsened depression   Paxlovid  [Nirmatrelvir-Ritonavir] Other (See Comments)    Ulcers in mouth   Azithromycin Rash   Erythromycin Rash    No orders of the defined types were placed in this encounter.   AUTHORIZATION INFORMATION Primary Insurance: 1D#: Group #:  Secondary Insurance: 1D#: Group #:  SCHEDULE INFORMATION: Date: 05/12/22 Time: Location: [facility]

## 2022-05-06 ENCOUNTER — Ambulatory Visit (INDEPENDENT_AMBULATORY_CARE_PROVIDER_SITE_OTHER): Payer: 59 | Admitting: Physician Assistant

## 2022-05-06 ENCOUNTER — Encounter: Payer: Self-pay | Admitting: Physician Assistant

## 2022-05-06 VITALS — BP 156/91 | HR 73 | Temp 99.1°F | Wt 231.8 lb

## 2022-05-06 DIAGNOSIS — R200 Anesthesia of skin: Secondary | ICD-10-CM | POA: Diagnosis not present

## 2022-05-06 DIAGNOSIS — R202 Paresthesia of skin: Secondary | ICD-10-CM | POA: Diagnosis not present

## 2022-05-06 DIAGNOSIS — K1379 Other lesions of oral mucosa: Secondary | ICD-10-CM | POA: Diagnosis not present

## 2022-05-06 DIAGNOSIS — R252 Cramp and spasm: Secondary | ICD-10-CM | POA: Diagnosis not present

## 2022-05-06 DIAGNOSIS — D509 Iron deficiency anemia, unspecified: Secondary | ICD-10-CM | POA: Diagnosis not present

## 2022-05-06 MED ORDER — LIDOCAINE VISCOUS HCL 2 % MT SOLN: 5.0000 mL | Freq: Three times a day (TID) | OROMUCOSAL | 1 refills | Status: DC | PRN

## 2022-05-06 NOTE — Progress Notes (Signed)
Established Patient Office Visit  Name: Hannah Kaiser   MRN: [id number]    DOB: 11-27-1960   Date:05/06/2022  Today's Provider: Talitha Givens, MHS, PA-C Introduced myself to the patient as a PA-C and provided education on APPs in clinical practice.         Subjective  Chief Complaint  Chief Complaint  Patient presents with   Spasms    Patient states she is having severe muscle spasms for about 3 weeks from the waist down. Has tried ibuprofen and tizanidine that she had at home but did not help.    Anemia    Patient states she would like to discuss changing iron medicine due to it causing abdominal pain.    Pain    Patient states she gets a sharp pain behind her eyes, states pain comes and goes and last just a few seconds. Recently got new glasses but has not improved.    Oral Pain    Patient requesting refill on magic mouthwash today, states she gets recurrent rawness on her tongue and the inside of her mouth.     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  Interventions: Has been alternating tylenol and ibuprofen as well as tried leftover muscle relaxers but this has not helped  She has plans to return to Neurology office that did her surgery   Iron deficiency  Was prescribed Ferrous Sulfate 325 mg tablets on 03/26/22 Reports this is upsetting her stomach She states she was not able to take much of the iron due to stomach pain   Oral Pain Reports her tongue continues to hurt and have lesions on it despite changes to diet and  drinks Reports magic mouth wash has been beneficial for this    Patient Active Problem List   Diagnosis Date Noted   Parotid gland enlargement 08/19/2021   Nasal sore 08/19/2021   Acute pain of left knee 04/09/2021   Nicotine dependence, cigarettes, uncomplicated 95/62/1308   Pain in limb 03/11/2021   Spinal osteoarthritis 09/26/2020   OSA (obstructive sleep apnea) 08/29/2020   Anxiety 10/31/2019   Precordial pain 10/28/2019   CAD (coronary artery disease) 06/15/2019   Severe episode of recurrent major depressive disorder, without psychotic features ([place]) 12/29/2017   Caregiver stress 12/03/2017   Degenerative spondylolisthesis 01/19/2017   Personal history of colonic polyps    Polyp of sigmoid colon    Problems with swallowing and mastication    Gastritis without bleeding    Hyperlipidemia 09/28/2016   Bladder prolapse, female, acquired 02/24/2016   Arthritis of both knees 01/14/2016   Herpes simplex infection 12/03/2015   Controlled substance agreement signed 12/03/2015   GERD (gastroesophageal reflux disease) 07/29/2015   Primary osteoarthritis of both knees 04/12/2015   Benign hypertensive renal disease 03/13/2015    Past Surgical History:  Procedure Laterality Date   BACK SURGERY     BELPHAROPTOSIS REPAIR     BROW LIFT Bilateral 03/24/2016   Procedure: BLEPHAROPLASTY;  Surgeon: Karle Starch,  MD;  Location: [facility];  Service: Ophthalmology;  Laterality: Bilateral;  sleep apnea   CARDIAC CATHETERIZATION     COLONOSCOPY WITH PROPOFOL N/A 10/19/2016   Procedure: COLONOSCOPY WITH PROPOFOL;  Surgeon: Lucilla Lame, MD;  Location: [facility];  Service: Endoscopy;  Laterality: N/A;   DILATION AND CURETTAGE OF UTERUS     ECTOPIC PREGNANCY SURGERY     x2   ESOPHAGOGASTRODUODENOSCOPY (EGD) WITH PROPOFOL N/A 10/19/2016   Procedure: ESOPHAGOGASTRODUODENOSCOPY (EGD) WITH PROPOFOL;  Surgeon: Lucilla Lame, MD;  Location: [facility];  Service: Endoscopy;   Laterality: N/A;   LEFT HEART CATH AND CORONARY ANGIOGRAPHY Left 12/29/2016   Procedure: Left Heart Cath and Coronary Angiography;  Surgeon: Dionisio David, MD;  Location: [facility];  Service: Cardiovascular;  Laterality: Left;   NECK SURGERY  2003   POLYPECTOMY  10/19/2016   Procedure: POLYPECTOMY;  Surgeon: Lucilla Lame, MD;  Location: [facility];  Service: Endoscopy;;   [place], 1998   TOTAL ABDOMINAL HYSTERECTOMY W/ BILATERAL SALPINGOOPHORECTOMY  2014   TUBAL LIGATION      Family History  Problem Relation Age of Onset   Cancer Brother        melanoma   Cancer Maternal Grandfather        prostate   Cancer Maternal Uncle        lung    Social History   Tobacco Use   Smoking status: Every Day    Packs/day: 1.50    Years: 41.00    Total pack years: 61.50    Types: Cigarettes   Smokeless tobacco: Never   Tobacco comments:    since age 21  Substance Use Topics   Alcohol use: No    Alcohol/week: 0.0 standard drinks of alcohol     Current Outpatient Medications:    albuterol (VENTOLIN HFA) 108 (90 Base) MCG/ACT inhaler, Inhale 2 puffs into the lungs every 6 (six) hours as needed for wheezing or shortness of breath., Disp: 8 g, Rfl: 6   amLODipine (NORVASC) 10 MG tablet, Take 1 tablet (10 mg total) by mouth daily., Disp: 90 tablet, Rfl: 1   atenolol (TENORMIN) 50 MG tablet, Take 1 tablet (50 mg total) by mouth daily., Disp: 90 tablet, Rfl: 1   baclofen (LIORESAL) 10 MG tablet, Take 1 tablet (10 mg total) by mouth at bedtime as needed for muscle spasms., Disp: 30 each, Rfl: 0   busPIRone (BUSPAR) 15 MG tablet, Take 1 tablet (15 mg total) by mouth 3 (three) times daily., Disp: 270 tablet, Rfl: 1   clonazePAM (KLONOPIN) 1 MG tablet, Take 1 tablet (1 mg total) by mouth 2 (two) times daily as needed. for anxiety, Disp: 30 tablet, Rfl: 0   ezetimibe (ZETIA) 10 MG tablet, Take 1 tablet (10 mg total) by mouth daily., Disp: 90 tablet, Rfl: 1    FLUoxetine (PROZAC) 40 MG capsule, Take 2 capsules (80 mg total) by mouth daily., Disp: 180 capsule, Rfl: 1   furosemide (LASIX) 20 MG tablet, TAKE 1 TABLET BY MOUTH ONCE DAILY AS NEEDED FOR SWELLING, Disp: 60 tablet, Rfl: 0   hydrOXYzine (ATARAX) 25 MG tablet, Take 1 tablet (25 mg total) by mouth 3 (three) times daily as needed., Disp: 90 tablet, Rfl: 1   ibuprofen (ADVIL) 800 MG tablet, Take 1 tablet (800 mg total) by mouth every 8 (eight) hours as needed., Disp: 270 tablet, Rfl: 1   isosorbide mononitrate (IMDUR) 30 MG 24 hr tablet, Take 1  tablet (30 mg total) by mouth daily., Disp: 90 tablet, Rfl: 1   latanoprost (XALATAN) 0.005 % ophthalmic solution, Place 1 drop into both eyes at bedtime., Disp: , Rfl:    losartan (COZAAR) 100 MG tablet, Take 1 tablet (100 mg total) by mouth daily., Disp: 90 tablet, Rfl: 1   mupirocin ointment (BACTROBAN) 2 %, Apply 1 application. topically 2 (two) times daily., Disp: 22 g, Rfl: 0   naproxen (NAPROSYN) 500 MG tablet, Take 1 tablet (500 mg total) by mouth 2 (two) times daily with a meal., Disp: 90 tablet, Rfl: 1   nystatin-triamcinolone ointment (MYCOLOG), Apply 1 application topically 2 (two) times daily., Disp: 30 g, Rfl: 1   pantoprazole (PROTONIX) 40 MG tablet, Take 1 tablet (40 mg total) by mouth 2 (two) times daily., Disp: 180 tablet, Rfl: 1   QUEtiapine (SEROQUEL) 25 MG tablet, Take 1 tablet (25 mg total) by mouth at bedtime., Disp: 90 tablet, Rfl: 1   rosuvastatin (CRESTOR) 5 MG tablet, Take 1 tablet (5 mg total) by mouth every other day., Disp: 90 tablet, Rfl: 1   triamcinolone ointment (KENALOG) 0.5 %, Apply 1 application topically 2 (two) times daily., Disp: 30 g, Rfl: 0   valACYclovir (VALTREX) 1000 MG tablet, Take 1 tablet (1,000 mg total) by mouth daily., Disp: 180 tablet, Rfl: 1   Iron, Ferrous Sulfate, 325 (65 Fe) MG TABS, Take 325 mg by mouth 2 (two) times daily. (Patient not taking: Reported on 05/06/2022), Disp: 60 tablet, Rfl: 3  Allergies   Allergen Reactions   Abilify [Aripiprazole] Swelling    Whole  body swelling, retains fluid   Ace Inhibitors Cough    So bad she vomited Other reaction(s): Cough So bad she vomited Other reaction(s): Cough So bad she vomited    Wellbutrin [Bupropion] Other (See Comments)    Worsened depression   Paxlovid [Nirmatrelvir-Ritonavir] Other (See Comments)    Ulcers in mouth   Azithromycin Rash   Erythromycin Rash    I personally reviewed active problem list, medication list, allergies, notes from last encounter, lab results with the patient/caregiver today.   Review of Systems  HENT:         Oral pain   Gastrointestinal:  Positive for abdominal pain.  Musculoskeletal:  Positive for myalgias.      Objective  Vitals:   05/06/22 0826  BP: (!) 156/91  Pulse: 73  Temp: 99.1 F (37.3 C)  SpO2: 97%  Weight: 231 lb 12.8 oz (105.1 kg)    Body mass index is 39.77 kg/m.  Physical Exam Vitals reviewed.  Constitutional:      General: She is awake.     Appearance: Normal appearance. She is well-developed and well-groomed.  HENT:     Head: Normocephalic.     Mouth/Throat:     Lips: Pink.     Mouth: Mucous membranes are moist.     Tongue: Lesions present. Tongue does not deviate from midline.     Palate: No mass and lesions.     Pharynx: Oropharynx is clear. Uvula midline. No oropharyngeal exudate or posterior oropharyngeal erythema.     Comments: Mild redness and cracking on lateral aspects of tongue  Eyes:     Extraocular Movements: Extraocular movements intact.     Conjunctiva/sclera: Conjunctivae normal.     Pupils: Pupils are equal, round, and reactive to light.  Pulmonary:     Effort: Pulmonary effort is normal.  Neurological:     General: No focal deficit present.  Mental Status: She is alert and oriented to person, place, and time.     Cranial Nerves: No dysarthria or facial asymmetry.     Motor: Motor function is intact. No weakness, tremor or abnormal  muscle tone.     Gait: Gait is intact.  Psychiatric:        Mood and Affect: Mood normal.        Behavior: Behavior normal. Behavior is cooperative.        Thought Content: Thought content normal.        Judgment: Judgment normal.      Recent Results (from the past 2160 hour(s))  Urinalysis, Routine w reflex microscopic     Status: Abnormal   Collection Time: 03/25/22  9:14 AM  Result Value Ref Range   Specific Gravity, UA 1.020 1.005 - 1.030   pH, UA 7.5 5.0 - 7.5   Color, UA Yellow Yellow   Appearance Ur Cloudy (A) Clear   Leukocytes,UA Negative Negative   Protein,UA Negative Negative/Trace   Glucose, UA Negative Negative   Ketones, UA Negative Negative   RBC, UA Negative Negative   Bilirubin, UA Negative Negative   Urobilinogen, Ur 0.2 0.2 - 1.0 mg/dL   Nitrite, UA Negative Negative  Microalbumin, Urine Waived     Status: None   Collection Time: 03/25/22  9:14 AM  Result Value Ref Range   Microalb, Ur Waived 10 0 - 19 mg/L   Creatinine, Urine Waived 50 10 - 300 mg/dL   Microalb/Creat Ratio <30 <30 mg/g    Comment:                              Abnormal:       30 - 300                         High Abnormal:           >300   Bayer DCA Hb A1c Waived     Status: None   Collection Time: 03/25/22  9:14 AM  Result Value Ref Range   HB A1C (BAYER DCA - WAIVED) 5.5 4.8 - 5.6 %    Comment:          Prediabetes: 5.7 - 6.4          Diabetes: >6.4          Glycemic control for adults with diabetes: <7.0   CBC with Differential/Platelet     Status: Abnormal   Collection Time: 03/25/22  9:16 AM  Result Value Ref Range   WBC 4.8 3.4 - 10.8 x10E3/uL   RBC 4.22 3.77 - 5.28 x10E6/uL   Hemoglobin 10.7 (L) 11.1 - 15.9 g/dL   Hematocrit 33.7 (L) 34.0 - 46.6 %   MCV 80 79 - 97 fL   MCH 25.4 (L) 26.6 - 33.0 pg   MCHC 31.8 31.5 - 35.7 g/dL   RDW 15.8 (H) 11.7 - 15.4 %   Platelets 254 150 - 450 x10E3/uL   Neutrophils 62 Not Estab. %   Lymphs 26 Not Estab. %   Monocytes 8 Not Estab.  %   Eos 3 Not Estab. %   Basos 1 Not Estab. %   Neutrophils Absolute 3.0 1.4 - 7.0 x10E3/uL   Lymphocytes Absolute 1.2 0.7 - 3.1 x10E3/uL   Monocytes Absolute 0.4 0.1 - 0.9 x10E3/uL   EOS (ABSOLUTE) 0.1 0.0 - 0.4 x10E3/uL  Basophils Absolute 0.0 0.0 - 0.2 x10E3/uL   Immature Granulocytes 0 Not Estab. %   Immature Grans (Abs) 0.0 0.0 - 0.1 x10E3/uL  Comprehensive metabolic panel     Status: None   Collection Time: 03/25/22  9:16 AM  Result Value Ref Range   Glucose 93 70 - 99 mg/dL   BUN 16 6 - 24 mg/dL   Creatinine, Ser 0.83 0.57 - 1.00 mg/dL   eGFR 81 >59 mL/min/1.73   BUN/Creatinine Ratio 19 9 - 23   Sodium 141 134 - 144 mmol/L   Potassium 4.3 3.5 - 5.2 mmol/L   Chloride 106 96 - 106 mmol/L   CO2 22 20 - 29 mmol/L   Calcium 9.3 8.7 - 10.2 mg/dL   Total Protein 6.9 6.0 - 8.5 g/dL   Albumin 4.5 3.8 - 4.9 g/dL   Globulin, Total 2.4 1.5 - 4.5 g/dL   Albumin/Globulin Ratio 1.9 1.2 - 2.2   Bilirubin Total 0.4 0.0 - 1.2 mg/dL   Alkaline Phosphatase 76 44 - 121 IU/L   AST 16 0 - 40 IU/L   ALT 12 0 - 32 IU/L  Lipid Panel w/o Chol/HDL Ratio     Status: Abnormal   Collection Time: 03/25/22  9:16 AM  Result Value Ref Range   Cholesterol, Total 224 (H) 100 - 199 mg/dL   Triglycerides 112 0 - 149 mg/dL   HDL 49 >39 mg/dL   VLDL Cholesterol Cal 20 5 - 40 mg/dL   LDL Chol Calc (NIH) 155 (H) 0 - 99 mg/dL  TSH     Status: None   Collection Time: 03/25/22  9:16 AM  Result Value Ref Range   TSH 0.741 0.450 - 4.500 uIU/mL  VITAMIN D 25 Hydroxy (Vit-D Deficiency, Fractures)     Status: Abnormal   Collection Time: 03/25/22  9:16 AM  Result Value Ref Range   Vit D, 25-Hydroxy 21.8 (L) 30.0 - 100.0 ng/mL    Comment: Vitamin D deficiency has been defined by the Institute of Medicine and an Endocrine Society practice guideline as a level of serum 25-OH vitamin D less than 20 ng/mL (1,2). The Endocrine Society went on to further define vitamin D insufficiency as a level between 21 and 29  ng/mL (2). 1. IOM (Institute of Medicine). 2010. Dietary reference    intakes for calcium and D. [place]: The    [employer]. 2. Holick MF, [place], Bischoff-Ferrari HA, et al.    Evaluation, treatment, and prevention of vitamin D    deficiency: an Endocrine Society clinical practice    guideline. JCEM. 2011 Jul; 96(7):1911-30.   Iron Binding Cap (TIBC)([facility])     Status: Abnormal   Collection Time: 03/25/22  9:16 AM  Result Value Ref Range   Total Iron Binding Capacity 371 250 - 450 ug/dL   UIBC 333 131 - 425 ug/dL   Iron 38 27 - 159 ug/dL   Iron Saturation 10 (L) 15 - 55 %  Ferritin     Status: Abnormal   Collection Time: 03/25/22  9:16 AM  Result Value Ref Range   Ferritin 12 (L) 15 - 150 ng/mL     PHQ2/9:    05/06/2022    8:34 AM 04/06/2022    1:05 PM 03/25/2022    8:50 AM 09/18/2021    3:20 PM 09/15/2021    3:47 PM  Depression screen PHQ 2/9  Decreased Interest 0 0 2 0 0  Down, Depressed, Hopeless 0 0 1 0  0  PHQ - 2 Score 0 0 3 0 0  Altered sleeping _0 Tired, decreased energy _1 Change in appetite _2 Feeling bad or failure about yourself  0 0 2 1 0  Trouble concentrating 1 0 2 1 0  Moving slowly or fidgety/restless 1 0 _3 Suicidal thoughts 0 0 0 0 0  PHQ-9 Score _4 Difficult doing work/chores Somewhat difficult Somewhat difficult Somewhat difficult Not difficult at all Somewhat difficult      Fall Risk:    04/06/2022    1:00 PM 09/18/2021    3:20 PM 09/15/2021    3:47 PM 08/19/2021    3:28 PM 03/31/2021   10:06 AM  Fall Risk   Falls in the past year? 0 1 1 0 0  Number falls in past yr: 0 0 0 0 0  Injury with Fall? 0 0 0 0 0  Risk for fall due to : No Fall Risks History of fall(s) No Fall Risks No Fall Risks No Fall Risks  Follow up _5       Functional  Status Survey:      Assessment & Plan  Problem List Items Addressed This Visit   None Visit Diagnoses     Numbness and tingling of lower extremity    -  Primary Acute, ongoing concern Reports muscle cramping and numbness/ tingling in her legs  Reviewed past medical hx and she has been evaluated for PAD and has hx of several back surgeries  Reviewed most recent lumbar spine imaging from 04/06/22 - no evidence of acute injuries but there is postsurgical and degenerative changes  Recommend she return to Neurology office that was evaluating her back pain and radiculopathy previously- referral placed today If needed may discuss return to Vascular as well to repeat PVD testing  Will check B12 since she is also having oral lesions and pain to rule out B12 neuropathy- B12 levels in normal ranges  Follow up as needed for persistent or progressing symptoms     Relevant Orders   Ambulatory referral to Neurology   B12 (Completed)   Iron deficiency anemia, unspecified iron deficiency anemia type     Anemia appears to be resolving Ferritin is still low at this time and she reports she is unable to tolerate PO iron supplementation Will refer to Hematology for them to evaluate if she is appropriate for iron infusions  Follow up as needed for persistent or progressing symptoms     Relevant Orders   Ambulatory referral to Hematology / Oncology   CBC w/Diff (Completed)   B12 (Completed)   Iron, TIBC and Ferritin Panel (Completed)   Muscle cramping     Acute, ongoing concern Reports muscle cramping in lower extremities along with intermittent numbness and tingling B12 in normal range, electrolytes overall normal at this time.  She has extensive hx of back surgeries so cannot rule out radiculopathy at this time She is also on statin therapy but has been taking for years so I am skeptical of this being causative Recommend staying well hydrated and performing gentle stretches to assist with  cramping May need to refer to Vascular to reassess for PVD  Follow up as needed    Relevant Orders   Comp Met (CMET) (Completed)   Pain in oral cavity  Ongoing concern Reports recurrent oral lesions that are painful and relieved with magic mouth wash B12 level was normal today  Will provide refill of magic mouth wash to assist with symptoms Follow up as needed    Relevant Medications   magic mouthwash (lidocaine, diphenhydrAMINE, alum & mag hydroxide) suspension        Return in about 8 weeks (around 07/01/2022) for muscle spasms, oral pain, iron deficiency .   I, Leonora Gores E Tomeeka Plaugher, PA-C, have reviewed all documentation for this visit. The documentation on 05/08/22 for the exam, diagnosis, procedures, and orders are all accurate and complete.   Talitha Givens, MHS, PA-C [facility]

## 2022-05-07 LAB — COMPREHENSIVE METABOLIC PANEL
ALT: 9 IU/L (ref 0–32)
AST: 19 IU/L (ref 0–40)
Albumin/Globulin Ratio: 2.2 (ref 1.2–2.2)
Albumin: 4.7 g/dL (ref 3.8–4.9)
Alkaline Phosphatase: 78 IU/L (ref 44–121)
BUN/Creatinine Ratio: 23 (ref 9–23)
BUN: 18 mg/dL (ref 6–24)
Bilirubin Total: 0.7 mg/dL (ref 0.0–1.2)
CO2: 17 mmol/L — ABNORMAL LOW (ref 20–29)
Calcium: 9.6 mg/dL (ref 8.7–10.2)
Chloride: 109 mmol/L — ABNORMAL HIGH (ref 96–106)
Creatinine, Ser: 0.77 mg/dL (ref 0.57–1.00)
Globulin, Total: 2.1 g/dL (ref 1.5–4.5)
Glucose: 95 mg/dL (ref 70–99)
Potassium: 4.7 mmol/L (ref 3.5–5.2)
Sodium: 146 mmol/L — ABNORMAL HIGH (ref 134–144)
Total Protein: 6.8 g/dL (ref 6.0–8.5)
eGFR: 89 mL/min/{1.73_m2} (ref >59)

## 2022-05-07 LAB — CBC WITH DIFFERENTIAL/PLATELET
Basophils Absolute: 0 10*3/uL (ref 0.0–0.2)
Basos: 1 %
EOS (ABSOLUTE): 0.2 10*3/uL (ref 0.0–0.4)
Eos: 3 %
Hematocrit: 34.8 % (ref 34.0–46.6)
Hemoglobin: 11.1 g/dL (ref 11.1–15.9)
Immature Grans (Abs): 0 10*3/uL (ref 0.0–0.1)
Immature Granulocytes: 0 %
Lymphocytes Absolute: 1.3 10*3/uL (ref 0.7–3.1)
Lymphs: 31 %
MCH: 25.6 pg — ABNORMAL LOW (ref 26.6–33.0)
MCHC: 31.9 g/dL (ref 31.5–35.7)
MCV: 80 fL (ref 79–97)
Monocytes Absolute: 0.4 10*3/uL (ref 0.1–0.9)
Monocytes: 8 %
Neutrophils Absolute: 2.5 10*3/uL (ref 1.4–7.0)
Neutrophils: 57 %
Platelets: 245 10*3/uL (ref 150–450)
RBC: 4.34 x10E6/uL (ref 3.77–5.28)
RDW: 15 % (ref 11.7–15.4)
WBC: 4.4 10*3/uL (ref 3.4–10.8)

## 2022-05-07 LAB — IRON,TIBC AND FERRITIN PANEL
Ferritin: 11 ng/mL — ABNORMAL LOW (ref 15–150)
Iron Saturation: 16 % (ref 15–55)
Iron: 69 ug/dL (ref 27–159)
Total Iron Binding Capacity: 425 ug/dL (ref 250–450)
UIBC: 356 ug/dL (ref 131–425)

## 2022-05-07 LAB — VITAMIN B12: Vitamin B-12: 325 pg/mL (ref 232–1245)

## 2022-05-11 ENCOUNTER — Inpatient Hospital Stay: Payer: 59 | Attending: Oncology | Admitting: Oncology

## 2022-05-11 ENCOUNTER — Encounter: Payer: Self-pay | Admitting: Oncology

## 2022-05-11 VITALS — BP 160/72 | HR 58 | Temp 96.0°F | Resp 18 | Ht 64.7 in | Wt 233.6 lb

## 2022-05-11 DIAGNOSIS — I110 Hypertensive heart disease with heart failure: Secondary | ICD-10-CM | POA: Diagnosis not present

## 2022-05-11 DIAGNOSIS — Z801 Family history of malignant neoplasm of trachea, bronchus and lung: Secondary | ICD-10-CM | POA: Insufficient documentation

## 2022-05-11 DIAGNOSIS — Z79624 Long term (current) use of inhibitors of nucleotide synthesis: Secondary | ICD-10-CM | POA: Diagnosis not present

## 2022-05-11 DIAGNOSIS — Z8042 Family history of malignant neoplasm of prostate: Secondary | ICD-10-CM | POA: Insufficient documentation

## 2022-05-11 DIAGNOSIS — Z79899 Other long term (current) drug therapy: Secondary | ICD-10-CM | POA: Insufficient documentation

## 2022-05-11 DIAGNOSIS — Z791 Long term (current) use of non-steroidal anti-inflammatories (NSAID): Secondary | ICD-10-CM | POA: Diagnosis not present

## 2022-05-11 DIAGNOSIS — R69 Illness, unspecified: Secondary | ICD-10-CM | POA: Diagnosis not present

## 2022-05-11 DIAGNOSIS — I509 Heart failure, unspecified: Secondary | ICD-10-CM | POA: Insufficient documentation

## 2022-05-11 DIAGNOSIS — G4730 Sleep apnea, unspecified: Secondary | ICD-10-CM | POA: Insufficient documentation

## 2022-05-11 DIAGNOSIS — D509 Iron deficiency anemia, unspecified: Secondary | ICD-10-CM | POA: Diagnosis not present

## 2022-05-11 DIAGNOSIS — K219 Gastro-esophageal reflux disease without esophagitis: Secondary | ICD-10-CM | POA: Diagnosis not present

## 2022-05-11 DIAGNOSIS — F17210 Nicotine dependence, cigarettes, uncomplicated: Secondary | ICD-10-CM | POA: Diagnosis not present

## 2022-05-11 NOTE — Progress Notes (Signed)
[place]  Telephone:(336) [phone] Fax:(336) [phone]  ID: Hannah Kaiser OB: 17-Oct-1960  MR#: [id number]  YTK#:[id number]  Patient Care Team: Valerie Roys, DO as PCP - General (Family Medicine) Rico Junker, RN as Registered Nurse Christene Lye, MD (General Surgery) Vladimir Crofts, MD (Neurology) Dionisio David, MD as Consulting Physician (Cardiology)  CHIEF COMPLAINT: Iron deficiency anemia.  INTERVAL HISTORY: Patient is a 61 year old female who was noted to have a mildly decreased hemoglobin and iron stores on routine blood work.  She was initially placed on oral iron supplementation, but this caused significant abdominal pain and was discontinued.  She currently feels well and is asymptomatic.  She has no neurologic complaints.  She denies any recent fevers or illnesses.  She has a good appetite and denies weight loss.  She has no chest pain, shortness of breath, cough, or hemoptysis.  She denies any nausea, vomiting, constipation, or diarrhea.  She has no melena or hematochezia.  She has no urinary complaints.  Patient feels at her baseline offers no specific complaints today.  REVIEW OF SYSTEMS:   Review of Systems  Constitutional: Negative.  Negative for fever, malaise/fatigue and weight loss.  Respiratory: Negative.  Negative for cough, hemoptysis and shortness of breath.   Cardiovascular: Negative.  Negative for chest pain and leg swelling.  Gastrointestinal: Negative.  Negative for abdominal pain, blood in stool and melena.  Genitourinary: Negative.  Negative for hematuria.  Musculoskeletal: Negative.  Negative for back pain.  Skin: Negative.  Negative for rash.  Neurological: Negative.  Negative for dizziness, focal weakness, weakness and headaches.  Psychiatric/Behavioral: Negative.  The patient is not nervous/anxious.     As per HPI. Otherwise, a complete review of systems is negative.  PAST MEDICAL HISTORY: Past Medical History:   Diagnosis Date   Anemia    Anxiety    Arthritis    knees, thoracic spurs and arthritis   Asthma    Breast pain    CHF (congestive heart failure) (HCC)    Colon polyps    COPD (chronic obstructive pulmonary disease) (HCC)    Depression    Dysrhythmia    GERD (gastroesophageal reflux disease)    Glaucoma    Heart murmur    History of blood transfusion    Hypertension    Motion sickness    cars   Multilevel degenerative disc disease    Neuropathy    Bilateral arms and legs   Nipple discharge    Numbness of both lower extremities    bulging lumbar discs - per pt   Numbness of upper extremity    bilateral - degenerative cervical discs - per pt   Pneumonia    PONV (postoperative nausea and vomiting)    Sleep apnea    has CPAP   Smokers' cough ([place])    Swelling of limb 03/11/2021   Wears dentures    full upper and lower    PAST SURGICAL HISTORY: Past Surgical History:  Procedure Laterality Date   BACK SURGERY     BELPHAROPTOSIS REPAIR     BROW LIFT Bilateral 03/24/2016   Procedure: BLEPHAROPLASTY;  Surgeon: Karle Starch, MD;  Location: [facility];  Service: Ophthalmology;  Laterality: Bilateral;  sleep apnea   CARDIAC CATHETERIZATION     COLONOSCOPY WITH PROPOFOL N/A 10/19/2016   Procedure: COLONOSCOPY WITH PROPOFOL;  Surgeon: Lucilla Lame, MD;  Location: [facility];  Service: Endoscopy;  Laterality: N/A;   DILATION AND CURETTAGE OF  UTERUS     ECTOPIC PREGNANCY SURGERY     x2   ESOPHAGOGASTRODUODENOSCOPY (EGD) WITH PROPOFOL N/A 10/19/2016   Procedure: ESOPHAGOGASTRODUODENOSCOPY (EGD) WITH PROPOFOL;  Surgeon: Lucilla Lame, MD;  Location: [facility];  Service: Endoscopy;  Laterality: N/A;   LEFT HEART CATH AND CORONARY ANGIOGRAPHY Left 12/29/2016   Procedure: Left Heart Cath and Coronary Angiography;  Surgeon: Dionisio David, MD;  Location: [facility];  Service: Cardiovascular;  Laterality: Left;   NECK SURGERY  2003   POLYPECTOMY   10/19/2016   Procedure: POLYPECTOMY;  Surgeon: Lucilla Lame, MD;  Location: [facility];  Service: Endoscopy;;   [place], 1998   TOTAL ABDOMINAL HYSTERECTOMY W/ BILATERAL SALPINGOOPHORECTOMY  2014   TUBAL LIGATION      FAMILY HISTORY: Family History  Problem Relation Age of Onset   Cancer Brother        melanoma   Cancer Maternal Grandfather        prostate   Cancer Maternal Uncle        lung    ADVANCED DIRECTIVES (Y/N):  N  HEALTH MAINTENANCE: Social History   Tobacco Use   Smoking status: Every Day    Packs/day: 1.50    Years: 41.00    Total pack years: 61.50    Types: Cigarettes   Smokeless tobacco: Never   Tobacco comments:    since age 70  Vaping Use   Vaping Use: Never used  Substance Use Topics   Alcohol use: No    Alcohol/week: 0.0 standard drinks of alcohol   Drug use: No     Colonoscopy:  PAP:  Bone density:  Lipid panel:  Allergies  Allergen Reactions   Abilify [Aripiprazole] Swelling    Whole  body swelling, retains fluid   Ace Inhibitors Cough    So bad she vomited Other reaction(s): Cough So bad she vomited Other reaction(s): Cough So bad she vomited    Wellbutrin [Bupropion] Other (See Comments)    Worsened depression   Paxlovid [Nirmatrelvir-Ritonavir] Other (See Comments)    Ulcers in mouth   Azithromycin Rash   Erythromycin Rash    Current Outpatient Medications  Medication Sig Dispense Refill   albuterol (VENTOLIN HFA) 108 (90 Base) MCG/ACT inhaler Inhale 2 puffs into the lungs every 6 (six) hours as needed for wheezing or shortness of breath. 8 g 6   amLODipine (NORVASC) 10 MG tablet Take 1 tablet (10 mg total) by mouth daily. 90 tablet 1   atenolol (TENORMIN) 50 MG tablet Take 1 tablet (50 mg total) by mouth daily. 90 tablet 1   baclofen (LIORESAL) 10 MG tablet Take 1 tablet (10 mg total) by mouth at bedtime as needed for muscle spasms. 30 each 0   busPIRone (BUSPAR) 15 MG tablet Take 1 tablet (15 mg  total) by mouth 3 (three) times daily. 270 tablet 1   ezetimibe (ZETIA) 10 MG tablet Take 1 tablet (10 mg total) by mouth daily. 90 tablet 1   FLUoxetine (PROZAC) 40 MG capsule Take 2 capsules (80 mg total) by mouth daily. 180 capsule 1   ibuprofen (ADVIL) 800 MG tablet Take 1 tablet (800 mg total) by mouth every 8 (eight) hours as needed. 270 tablet 1   isosorbide mononitrate (IMDUR) 30 MG 24 hr tablet Take 1 tablet (30 mg total) by mouth daily. 90 tablet 1   losartan (COZAAR) 100 MG tablet Take 1 tablet (100 mg total) by mouth daily. 90 tablet 1  magic mouthwash (lidocaine, diphenhydrAMINE, alum & mag hydroxide) suspension Swish and spit 5 mLs 3 (three) times daily as needed for mouth pain. 360 mL 1   mupirocin ointment (BACTROBAN) 2 % Apply 1 application. topically 2 (two) times daily. 22 g 0   naproxen (NAPROSYN) 500 MG tablet Take 1 tablet (500 mg total) by mouth 2 (two) times daily with a meal. 90 tablet 1   nystatin-triamcinolone ointment (MYCOLOG) Apply 1 application topically 2 (two) times daily. 30 g 1   pantoprazole (PROTONIX) 40 MG tablet Take 1 tablet (40 mg total) by mouth 2 (two) times daily. 180 tablet 1   QUEtiapine (SEROQUEL) 25 MG tablet Take 1 tablet (25 mg total) by mouth at bedtime. 90 tablet 1   rosuvastatin (CRESTOR) 5 MG tablet Take 1 tablet (5 mg total) by mouth every other day. 90 tablet 1   triamcinolone ointment (KENALOG) 0.5 % Apply 1 application topically 2 (two) times daily. 30 g 0   clonazePAM (KLONOPIN) 1 MG tablet Take 1 tablet (1 mg total) by mouth 2 (two) times daily as needed. for anxiety (Patient not taking: Reported on 05/11/2022) 30 tablet 0   furosemide (LASIX) 20 MG tablet TAKE 1 TABLET BY MOUTH ONCE DAILY AS NEEDED FOR SWELLING (Patient not taking: Reported on 05/11/2022) 60 tablet 0   hydrOXYzine (ATARAX) 25 MG tablet Take 1 tablet (25 mg total) by mouth 3 (three) times daily as needed. (Patient not taking: Reported on 05/11/2022) 90 tablet 1   latanoprost  (XALATAN) 0.005 % ophthalmic solution Place 1 drop into both eyes at bedtime. (Patient not taking: Reported on 05/11/2022)     valACYclovir (VALTREX) 1000 MG tablet Take 1 tablet (1,000 mg total) by mouth daily. (Patient not taking: Reported on 05/11/2022) 180 tablet 1   No current facility-administered medications for this visit.    OBJECTIVE: Vitals:   05/11/22 1119  BP: (!) 160/72  Pulse: (!) 58  Resp: 18  Temp: (!) 96 F (35.6 C)  SpO2: 98%     Body mass index is 39.23 kg/m.    ECOG FS:0 - Asymptomatic  General: Well-developed, well-nourished, no acute distress. Eyes: Pink conjunctiva, anicteric sclera. HEENT: Normocephalic, moist mucous membranes. Lungs: No audible wheezing or coughing. Heart: Regular rate and rhythm. Abdomen: Soft, nontender, no obvious distention. Musculoskeletal: No edema, cyanosis, or clubbing. Neuro: Alert, answering all questions appropriately. Cranial nerves grossly intact. Skin: No rashes or petechiae noted. Psych: Normal affect. Lymphatics: No cervical, calvicular, axillary or inguinal LAD.   LAB RESULTS:  Lab Results  Component Value Date   NA 146 (H) 05/06/2022   K 4.7 05/06/2022   CL 109 (H) 05/06/2022   CO2 17 (L) 05/06/2022   GLUCOSE 95 05/06/2022   BUN 18 05/06/2022   CREATININE 0.77 05/06/2022   CALCIUM 9.6 05/06/2022   PROT 6.8 05/06/2022   ALBUMIN 4.7 05/06/2022   AST 19 05/06/2022   ALT 9 05/06/2022   ALKPHOS 78 05/06/2022   BILITOT 0.7 05/06/2022   GFRNONAA 53 (L) 03/18/2018   GFRAA 61 03/18/2018    Lab Results  Component Value Date   WBC 4.4 05/06/2022   NEUTROABS 2.5 05/06/2022   HGB 11.1 05/06/2022   HCT 34.8 05/06/2022   MCV 80 05/06/2022   PLT 245 05/06/2022   Lab Results  Component Value Date   IRON 69 05/06/2022   TIBC 425 05/06/2022   IRONPCTSAT 16 05/06/2022   Lab Results  Component Value Date   FERRITIN 11 (L) 05/06/2022  STUDIES: No results found.  ASSESSMENT: Iron deficiency  anemia.  PLAN:    Iron deficiency anemia: Above patient's hemoglobin is reported within normal limits, but it is still decreased at 11.1.  She also has a significantly reduced ferritin level.  The remainder of her iron panel is on the low end of the normal range.  She cannot tolerate oral iron supplementation.  She has a colonoscopy scheduled for tomorrow.  Patient will receive Venofer x3 over the next 1 to 2 weeks.  She will then return to clinic in 3 months with repeat laboratory work, further evaluation, and continuation of IV iron if necessary.  I spent a total of 45 minutes reviewing chart data, face-to-face evaluation with the patient, counseling and coordination of care as detailed above.   Patient expressed understanding and was in agreement with this plan. She also understands that She can call clinic at any time with any questions, concerns, or complaints.    Lloyd Huger, MD   05/11/2022 12:23 PM

## 2022-05-11 NOTE — Progress Notes (Signed)
IDA. New problem. Stay fatigued. No visible blood loss. Colonoscopy screening is tomorrow Pt does not see visible blood. Dyspnea with muscle cramping in both legs. MD doesn't know what is causing it. Has a referral to neurology for that. Occ lightheadedness and dizziness. Ferritin is 11.

## 2022-05-12 ENCOUNTER — Ambulatory Visit: Payer: 59 | Admitting: Certified Registered Nurse Anesthetist

## 2022-05-12 ENCOUNTER — Encounter: Payer: Self-pay | Admitting: Gastroenterology

## 2022-05-12 ENCOUNTER — Encounter
Admission: RE | Disposition: A | Discharge status: Home / Self Care | Payer: Self-pay | Source: Home / Self Care | Attending: Gastroenterology

## 2022-05-12 ENCOUNTER — Ambulatory Visit
Admission: RE | Admit: 2022-05-12 | Discharge: 2022-05-12 | Disposition: A | Discharge status: Home / Self Care | Payer: 59 | Attending: Gastroenterology | Admitting: Gastroenterology

## 2022-05-12 DIAGNOSIS — K635 Polyp of colon: Secondary | ICD-10-CM

## 2022-05-12 DIAGNOSIS — G4730 Sleep apnea, unspecified: Secondary | ICD-10-CM | POA: Diagnosis not present

## 2022-05-12 DIAGNOSIS — J449 Chronic obstructive pulmonary disease, unspecified: Secondary | ICD-10-CM | POA: Insufficient documentation

## 2022-05-12 DIAGNOSIS — I110 Hypertensive heart disease with heart failure: Secondary | ICD-10-CM | POA: Insufficient documentation

## 2022-05-12 DIAGNOSIS — M47814 Spondylosis without myelopathy or radiculopathy, thoracic region: Secondary | ICD-10-CM | POA: Insufficient documentation

## 2022-05-12 DIAGNOSIS — I2510 Atherosclerotic heart disease of native coronary artery without angina pectoris: Secondary | ICD-10-CM | POA: Diagnosis not present

## 2022-05-12 DIAGNOSIS — D500 Iron deficiency anemia secondary to blood loss (chronic): Secondary | ICD-10-CM

## 2022-05-12 DIAGNOSIS — I509 Heart failure, unspecified: Secondary | ICD-10-CM | POA: Diagnosis not present

## 2022-05-12 DIAGNOSIS — K640 First degree hemorrhoids: Secondary | ICD-10-CM | POA: Diagnosis not present

## 2022-05-12 DIAGNOSIS — F419 Anxiety disorder, unspecified: Secondary | ICD-10-CM | POA: Insufficient documentation

## 2022-05-12 DIAGNOSIS — D509 Iron deficiency anemia, unspecified: Secondary | ICD-10-CM | POA: Diagnosis not present

## 2022-05-12 DIAGNOSIS — D123 Benign neoplasm of transverse colon: Secondary | ICD-10-CM | POA: Diagnosis not present

## 2022-05-12 DIAGNOSIS — F32A Depression, unspecified: Secondary | ICD-10-CM | POA: Insufficient documentation

## 2022-05-12 DIAGNOSIS — D122 Benign neoplasm of ascending colon: Secondary | ICD-10-CM | POA: Diagnosis not present

## 2022-05-12 DIAGNOSIS — K219 Gastro-esophageal reflux disease without esophagitis: Secondary | ICD-10-CM | POA: Insufficient documentation

## 2022-05-12 DIAGNOSIS — Z86010 Personal history of colonic polyps: Secondary | ICD-10-CM

## 2022-05-12 DIAGNOSIS — I499 Cardiac arrhythmia, unspecified: Secondary | ICD-10-CM | POA: Diagnosis not present

## 2022-05-12 DIAGNOSIS — D125 Benign neoplasm of sigmoid colon: Secondary | ICD-10-CM | POA: Diagnosis not present

## 2022-05-12 DIAGNOSIS — M170 Bilateral primary osteoarthritis of knee: Secondary | ICD-10-CM | POA: Insufficient documentation

## 2022-05-12 HISTORY — PX: COLONOSCOPY WITH PROPOFOL: SHX5780

## 2022-05-12 SURGERY — COLONOSCOPY WITH PROPOFOL
Anesthesia: General

## 2022-05-12 MED ORDER — LIDOCAINE HCL (PF) 2 % IJ SOLN
INTRAMUSCULAR | Status: AC
  Filled 2022-05-12: qty 5

## 2022-05-12 MED ORDER — SODIUM CHLORIDE 0.9 % IV SOLN
INTRAVENOUS | Status: DC
  Administered 2022-05-12: 1000 mL via INTRAVENOUS

## 2022-05-12 MED ORDER — PROPOFOL 10 MG/ML IV BOLUS
INTRAVENOUS | Status: DC | PRN
  Administered 2022-05-12: 50 mg via INTRAVENOUS
  Administered 2022-05-12: 20 mg via INTRAVENOUS

## 2022-05-12 MED ORDER — GLYCOPYRROLATE 0.2 MG/ML IJ SOLN
INTRAMUSCULAR | Status: DC | PRN
  Administered 2022-05-12: .2 mg via INTRAVENOUS

## 2022-05-12 MED ORDER — DEXMEDETOMIDINE HCL IN NACL 80 MCG/20ML IV SOLN
INTRAVENOUS | Status: DC | PRN
  Administered 2022-05-12: 8 ug via BUCCAL

## 2022-05-12 MED ORDER — GLYCOPYRROLATE 0.2 MG/ML IJ SOLN
INTRAMUSCULAR | Status: AC
  Filled 2022-05-12: qty 1

## 2022-05-12 MED ORDER — DEXMEDETOMIDINE HCL IN NACL 80 MCG/20ML IV SOLN
INTRAVENOUS | Status: AC
  Filled 2022-05-12: qty 20

## 2022-05-12 MED ORDER — PROPOFOL 500 MG/50ML IV EMUL
INTRAVENOUS | Status: DC | PRN
  Administered 2022-05-12: 150 ug/kg/min via INTRAVENOUS

## 2022-05-12 MED ORDER — LIDOCAINE HCL (CARDIAC) PF 100 MG/5ML IV SOSY
PREFILLED_SYRINGE | INTRAVENOUS | Status: DC | PRN
  Administered 2022-05-12: 50 mg via INTRAVENOUS

## 2022-05-12 NOTE — Transfer of Care (Signed)
Immediate Anesthesia Transfer of Care Note  Patient: Hannah Kaiser  Procedure(s) Performed: COLONOSCOPY WITH PROPOFOL  Patient Location: Endoscopy Unit  Anesthesia Type:General  Level of Consciousness: drowsy  Airway & Oxygen Therapy: Patient Spontanous Breathing  Post-op Assessment: Report given to RN and Post -op Vital signs reviewed and stable  Post vital signs: Reviewed and stable  Last Vitals:  Vitals Value Taken Time  BP    Temp    Pulse 52 05/12/22 0849  Resp 15 05/12/22 0849  SpO2 98 % 05/12/22 0849  Vitals shown include unvalidated device data.  Last Pain:  Vitals:   05/12/22 0802  TempSrc:   PainSc: 0-No pain         Complications: No notable events documented.

## 2022-05-12 NOTE — Op Note (Signed)
[facility] Gastroenterology Patient Name: Hannah Kaiser Procedure Date: 05/12/2022 8:10 AM MRN: [id number] Account #: [id number] Date of Birth: February 20, 1961 Admit Type: Outpatient Age: 61 Room: [facility] ENDO ROOM 4 Gender: Female Note Status: Finalized Instrument Name: Jasper Riling [id number] Procedure:             Colonoscopy Indications:           Iron deficiency anemia Providers:             Lucilla Lame MD, MD Medicines:             Propofol per Anesthesia Complications:         No immediate complications. Procedure:             Pre-Anesthesia Assessment:                        - Prior to the procedure, a History and Physical was                         performed, and patient medications and allergies were                         reviewed. The patient's tolerance of previous                         anesthesia was also reviewed. The risks and benefits                         of the procedure and the sedation options and risks                         were discussed with the patient. All questions were                         answered, and informed consent was obtained. Prior                         Anticoagulants: The patient has taken no anticoagulant                         or antiplatelet agents. ASA Grade Assessment: II - A                         patient with mild systemic disease. After reviewing                         the risks and benefits, the patient was deemed in                         satisfactory condition to undergo the procedure.                        After obtaining informed consent, the colonoscope was                         passed under direct vision. Throughout the procedure,                         the patient's blood pressure, pulse,  and oxygen                         saturations were monitored continuously. The                         Colonoscope was introduced through the anus and                         advanced to the the cecum, identified by  appendiceal                         orifice and ileocecal valve. The colonoscopy was                         performed without difficulty. The patient tolerated                         the procedure well. The quality of the bowel                         preparation was excellent. Findings:      The perianal and digital rectal examinations were normal.      A 9 mm polyp was found in the ascending colon. The polyp was sessile.       The polyp was removed with a cold snare. Resection and retrieval were       complete.      A 4 mm polyp was found in the transverse colon. The polyp was sessile.       The polyp was removed with a cold snare. Resection and retrieval were       complete.      Non-bleeding internal hemorrhoids were found during retroflexion. The       hemorrhoids were Grade I (internal hemorrhoids that do not prolapse).      Non-bleeding internal hemorrhoids were found during retroflexion. The       hemorrhoids were Grade I (internal hemorrhoids that do not prolapse). Impression:            - One 9 mm polyp in the ascending colon, removed with                         a cold snare. Resected and retrieved.                        - One 4 mm polyp in the transverse colon, removed with                         a cold snare. Resected and retrieved.                        - Non-bleeding internal hemorrhoids.                        - Non-bleeding internal hemorrhoids. Recommendation:        - Discharge patient to home.                        - Resume previous diet.                        -  Continue present medications.                        - If the pathology report reveals adenomatous tissue,                         then repeat the colonoscopy for surveillance in 7                         years. Procedure Code(s):     --- Professional ---                        [phone], Colonoscopy, flexible; with removal of                         tumor(s), polyp(s), or other lesion(s) by snare                          technique Diagnosis Code(s):     --- Professional ---                        D50.9, Iron deficiency anemia, unspecified                        D12.3, Benign neoplasm of transverse colon (hepatic                         flexure or splenic flexure) CPT copyright 2022 [employer]. All rights reserved. The codes documented in this report are preliminary and upon coder review may  be revised to meet current compliance requirements. Lucilla Lame MD, MD 05/12/2022 8:47:46 AM This report has been signed electronically. Number of Addenda: 0 Note Initiated On: 05/12/2022 8:10 AM Scope Withdrawal Time: 0 hours 7 minutes 47 seconds  Total Procedure Duration: 0 hours 11 minutes 54 seconds  Estimated Blood Loss:  Estimated blood loss: none.      [facility]

## 2022-05-12 NOTE — Anesthesia Procedure Notes (Signed)
Procedure Name: MAC Date/Time: 05/12/2022 8:17 AM  Performed by: Tollie Eth, CRNAPre-anesthesia Checklist: Patient identified, Emergency Drugs available, Suction available and Patient being monitored Patient Re-evaluated:Patient Re-evaluated prior to induction Oxygen Delivery Method: Nasal cannula Induction Type: IV induction Placement Confirmation: positive ETCO2

## 2022-05-12 NOTE — H&P (Signed)
Hannah Lame, MD [address] [place], [place] [address] Phone:[phone] Fax : [phone]  Primary Care Physician:  Valerie Roys, DO Primary Gastroenterologist:  Dr. Allen Norris  Pre-Procedure History & Physical: HPI:  Hannah Kaiser is a 61 y.o. female is here for an colonoscopy.   Past Medical History:  Diagnosis Date   Anemia    Anxiety    Arthritis    knees, thoracic spurs and arthritis   Asthma    Breast pain    CHF (congestive heart failure) (HCC)    Colon polyps    COPD (chronic obstructive pulmonary disease) (HCC)    Depression    Dysrhythmia    GERD (gastroesophageal reflux disease)    Glaucoma    Heart murmur    History of blood transfusion    Hypertension    Motion sickness    cars   Multilevel degenerative disc disease    Neuropathy    Bilateral arms and legs   Nipple discharge    Numbness of both lower extremities    bulging lumbar discs - per pt   Numbness of upper extremity    bilateral - degenerative cervical discs - per pt   Pneumonia    PONV (postoperative nausea and vomiting)    Sleep apnea    has CPAP   Smokers' cough ([place])    Swelling of limb 03/11/2021   Wears dentures    full upper and lower    Past Surgical History:  Procedure Laterality Date   ABDOMINAL HYSTERECTOMY     BACK SURGERY     BELPHAROPTOSIS REPAIR     BROW LIFT Bilateral 03/24/2016   Procedure: BLEPHAROPLASTY;  Surgeon: Karle Starch, MD;  Location: [facility];  Service: Ophthalmology;  Laterality: Bilateral;  sleep apnea   CARDIAC CATHETERIZATION     COLONOSCOPY WITH PROPOFOL N/A 10/19/2016   Procedure: COLONOSCOPY WITH PROPOFOL;  Surgeon: Hannah Lame, MD;  Location: [facility];  Service: Endoscopy;  Laterality: N/A;   DILATION AND CURETTAGE OF UTERUS     ECTOPIC PREGNANCY SURGERY     x2   ESOPHAGOGASTRODUODENOSCOPY (EGD) WITH PROPOFOL N/A 10/19/2016   Procedure: ESOPHAGOGASTRODUODENOSCOPY (EGD) WITH PROPOFOL;  Surgeon: Hannah Lame, MD;  Location: [facility];  Service: Endoscopy;  Laterality: N/A;   LEFT HEART CATH AND CORONARY ANGIOGRAPHY Left 12/29/2016   Procedure: Left Heart Cath and Coronary Angiography;  Surgeon: Dionisio David, MD;  Location: [facility];  Service: Cardiovascular;  Laterality: Left;   NECK SURGERY  2003   POLYPECTOMY  10/19/2016   Procedure: POLYPECTOMY;  Surgeon: Hannah Lame, MD;  Location: [facility];  Service: Endoscopy;;   [place], 1998   TOTAL ABDOMINAL HYSTERECTOMY W/ BILATERAL SALPINGOOPHORECTOMY  2014   TUBAL LIGATION      Prior to Admission medications   Medication Sig Start Date End Date Taking? Authorizing Provider  amLODipine (NORVASC) 10 MG tablet Take 1 tablet (10 mg total) by mouth daily. 03/25/22  Yes Johnson, Megan P, DO  atenolol (TENORMIN) 50 MG tablet Take 1 tablet (50 mg total) by mouth daily. 03/25/22  Yes Johnson, Megan P, DO  busPIRone (BUSPAR) 15 MG tablet Take 1 tablet (15 mg total) by mouth 3 (three) times daily. 03/25/22  Yes Johnson, Megan P, DO  ezetimibe (ZETIA) 10 MG tablet Take 1 tablet (10 mg total) by mouth daily. 03/25/22  Yes Johnson, Megan P, DO  furosemide (LASIX) 20 MG tablet TAKE 1  TABLET BY MOUTH ONCE DAILY AS NEEDED FOR SWELLING 03/25/22 03/25/23 Yes Johnson, Megan P, DO  isosorbide mononitrate (IMDUR) 30 MG 24 hr tablet Take 1 tablet (30 mg total) by mouth daily. 03/25/22  Yes Johnson, Megan P, DO  losartan (COZAAR) 100 MG tablet Take 1 tablet (100 mg total) by mouth daily. 03/25/22  Yes Johnson, Megan P, DO  mupirocin ointment (BACTROBAN) 2 % Apply 1 application. topically 2 (two) times daily. 08/22/21  Yes Vigg, Avanti, MD  naproxen (NAPROSYN) 500 MG tablet Take 1 tablet (500 mg total) by mouth 2 (two) times daily with a meal. 03/25/22  Yes Johnson, Megan P, DO  nystatin-triamcinolone ointment (MYCOLOG) Apply 1 application topically 2 (two) times daily. 08/30/20  Yes Jon Billings, NP  QUEtiapine  (SEROQUEL) 25 MG tablet Take 1 tablet (25 mg total) by mouth at bedtime. 03/25/22  Yes Johnson, Megan P, DO  rosuvastatin (CRESTOR) 5 MG tablet Take 1 tablet (5 mg total) by mouth every other day. 03/25/22  Yes Johnson, Megan P, DO  triamcinolone ointment (KENALOG) 0.5 % Apply 1 application topically 2 (two) times daily. 08/30/20  Yes Jon Billings, NP  albuterol (VENTOLIN HFA) 108 (90 Base) MCG/ACT inhaler Inhale 2 puffs into the lungs every 6 (six) hours as needed for wheezing or shortness of breath. 03/25/22   Johnson, Megan P, DO  baclofen (LIORESAL) 10 MG tablet Take 1 tablet (10 mg total) by mouth at bedtime as needed for muscle spasms. 03/25/22   Johnson, Megan P, DO  clonazePAM (KLONOPIN) 1 MG tablet Take 1 tablet (1 mg total) by mouth 2 (two) times daily as needed. for anxiety Patient not taking: Reported on 05/11/2022 04/06/22   [place] Liter P, DO  FLUoxetine (PROZAC) 40 MG capsule Take 2 capsules (80 mg total) by mouth daily. 03/25/22   Johnson, Megan P, DO  hydrOXYzine (ATARAX) 25 MG tablet Take 1 tablet (25 mg total) by mouth 3 (three) times daily as needed. Patient not taking: Reported on 05/11/2022 03/25/22   [place] Liter P, DO  ibuprofen (ADVIL) 800 MG tablet Take 1 tablet (800 mg total) by mouth every 8 (eight) hours as needed. 08/30/20   Jon Billings, NP  latanoprost (XALATAN) 0.005 % ophthalmic solution Place 1 drop into both eyes at bedtime. Patient not taking: Reported on 05/11/2022    [provider]  magic mouthwash (lidocaine, diphenhydrAMINE, alum & mag hydroxide) suspension Swish and spit 5 mLs 3 (three) times daily as needed for mouth pain. 05/06/22   Mecum, Erin E, PA-C  pantoprazole (PROTONIX) 40 MG tablet Take 1 tablet (40 mg total) by mouth 2 (two) times daily. Patient not taking: Reported on 05/12/2022 03/25/22   [place] Liter P, DO  valACYclovir (VALTREX) 1000 MG tablet Take 1 tablet (1,000 mg total) by mouth daily. Patient not taking: Reported on  05/11/2022 03/25/22   [place] Liter P, DO    Allergies as of 04/07/2022 - Review Complete 04/06/2022  Allergen Reaction Noted   Abilify [aripiprazole] Swelling 03/27/2016   Ace inhibitors Cough 03/13/2015   Wellbutrin [bupropion] Other (See Comments) 12/03/2015   Paxlovid [nirmatrelvir-ritonavir] Other (See Comments) 03/03/2021   Azithromycin Rash 03/13/2015   Erythromycin Rash 08/21/2013    Family History  Problem Relation Age of Onset   Cancer Brother        melanoma   Cancer Maternal Grandfather        prostate   Cancer Maternal Uncle        lung    Social History  Socioeconomic History   Marital status: Legally Separated    Spouse name: Not on file   Number of children: Not on file   Years of education: Not on file   Highest education level: Not on file  Occupational History   Not on file  Tobacco Use   Smoking status: Every Day    Packs/day: 1.50    Years: 41.00    Total pack years: 61.50    Types: Cigarettes   Smokeless tobacco: Never   Tobacco comments:    since age 6  Vaping Use   Vaping Use: Never used  Substance and Sexual Activity   Alcohol use: No    Alcohol/week: 0.0 standard drinks of alcohol   Drug use: No   Sexual activity: Not Currently    Birth control/protection: Surgical  Other Topics Concern   Not on file  Social History Narrative   Not on file   Social Determinants of Health   Financial Resource Strain: Not on file  Food Insecurity: Not on file  Transportation Needs: Not on file  Physical Activity: Not on file  Stress: Not on file  Social Connections: Not on file  Intimate Partner Violence: Not on file    Review of Systems: See HPI, otherwise negative ROS  Physical Exam: BP (!) 162/81   Pulse 64   Temp (!) 96.4 F (35.8 C) (Temporal)   Resp 18   Ht '5\' 5"'$  (1.651 m)   Wt 105.9 kg   SpO2 100%   BMI 38.85 kg/m  General:   Alert,  pleasant and cooperative in NAD Head:  Normocephalic and atraumatic. Neck:  Supple; no  masses or thyromegaly. Lungs:  Clear throughout to auscultation.    Heart:  Regular rate and rhythm. Abdomen:  Soft, nontender and nondistended. Normal bowel sounds, without guarding, and without rebound.   Neurologic:  Alert and  oriented x4;  grossly normal neurologically.  Impression/Plan: Hannah Kaiser is here for an colonoscopy to be performed for IDA  Risks, benefits, limitations, and alternatives regarding  colonoscopy have been reviewed with the patient.  Questions have been answered.  All parties agreeable.   Hannah Lame, MD  05/12/2022, 8:19 AM

## 2022-05-12 NOTE — Anesthesia Preprocedure Evaluation (Signed)
Anesthesia Evaluation  Patient identified by MRN, date of birth, ID band Patient awake    Reviewed: Allergy & Precautions, H&P , NPO status , Patient's Chart, lab work & pertinent test results, reviewed documented beta blocker date and time   History of Anesthesia Complications (+) PONV and history of anesthetic complications  Airway Mallampati: III  TM Distance: >3 FB Neck ROM: full  Mouth opening: Limited Mouth Opening  Dental  (+) Dental Advidsory Given, Edentulous Upper, Edentulous Lower, Upper Dentures, Lower Dentures   Pulmonary shortness of breath and with exertion, asthma , sleep apnea , COPD,  COPD inhaler, neg recent URI, Current Smoker   Pulmonary exam normal breath sounds clear to auscultation       Cardiovascular Exercise Tolerance: Good hypertension, (-) angina + CAD and +CHF  (-) Past MI and (-) Cardiac Stents Normal cardiovascular exam+ dysrhythmias + Valvular Problems/Murmurs  Rhythm:regular Rate:Normal     Neuro/Psych  PSYCHIATRIC DISORDERS Anxiety Depression    negative neurological ROS     GI/Hepatic Neg liver ROS,GERD  ,,  Endo/Other  negative endocrine ROS    Renal/GU negative Renal ROS  negative genitourinary   Musculoskeletal   Abdominal   Peds  Hematology negative hematology ROS (+)   Anesthesia Other Findings Past Medical History: No date: Anemia No date: Anxiety No date: Arthritis     Comment:  knees, thoracic spurs and arthritis No date: Asthma No date: Breast pain No date: CHF (congestive heart failure) (HCC) No date: Colon polyps No date: COPD (chronic obstructive pulmonary disease) (HCC) No date: Depression No date: Dysrhythmia No date: GERD (gastroesophageal reflux disease) No date: Glaucoma No date: Heart murmur No date: History of blood transfusion No date: Hypertension No date: Motion sickness     Comment:  cars No date: Multilevel degenerative disc disease No date:  Neuropathy     Comment:  Bilateral arms and legs No date: Nipple discharge No date: Numbness of both lower extremities     Comment:  bulging lumbar discs - per pt No date: Numbness of upper extremity     Comment:  bilateral - degenerative cervical discs - per pt No date: Pneumonia No date: PONV (postoperative nausea and vomiting) No date: Sleep apnea     Comment:  has CPAP No date: Smokers' cough ([place]) 03/11/2021: Swelling of limb No date: Wears dentures     Comment:  full upper and lower   Reproductive/Obstetrics negative OB ROS                             Anesthesia Physical Anesthesia Plan  ASA: 3  Anesthesia Plan: General   Post-op Pain Management:    Induction: Intravenous  PONV Risk Score and Plan: 3 and Propofol infusion and TIVA  Airway Management Planned: Natural Airway and Nasal Cannula  Additional Equipment:   Intra-op Plan:   Post-operative Plan:   Informed Consent: I have reviewed the patients History and Physical, chart, labs and discussed the procedure including the risks, benefits and alternatives for the proposed anesthesia with the patient or authorized representative who has indicated his/her understanding and acceptance.     Dental Advisory Given  Plan Discussed with: Anesthesiologist, CRNA and Surgeon  Anesthesia Plan Comments:        Anesthesia Quick Evaluation

## 2022-05-13 ENCOUNTER — Encounter: Payer: Self-pay | Admitting: Gastroenterology

## 2022-05-13 LAB — SURGICAL PATHOLOGY

## 2022-05-14 ENCOUNTER — Ambulatory Visit
Admission: RE | Admit: 2022-05-14 | Discharge: 2022-05-14 | Disposition: A | Discharge status: Home / Self Care | Payer: 59 | Source: Ambulatory Visit | Attending: Family Medicine | Admitting: Family Medicine

## 2022-05-14 DIAGNOSIS — Z1231 Encounter for screening mammogram for malignant neoplasm of breast: Secondary | ICD-10-CM | POA: Diagnosis not present

## 2022-05-14 NOTE — Anesthesia Postprocedure Evaluation (Signed)
Anesthesia Post Note  Patient: Hannah Kaiser  Procedure(s) Performed: COLONOSCOPY WITH PROPOFOL  Patient location during evaluation: Endoscopy Anesthesia Type: General Level of consciousness: awake and alert Pain management: pain level controlled Vital Signs Assessment: post-procedure vital signs reviewed and stable Respiratory status: spontaneous breathing, nonlabored ventilation, respiratory function stable and patient connected to nasal cannula oxygen Cardiovascular status: blood pressure returned to baseline and stable Postop Assessment: no apparent nausea or vomiting Anesthetic complications: no   No notable events documented.   Last Vitals:  Vitals:   05/12/22 0858 05/12/22 0908  BP: 113/80 (!) 143/72  Pulse: (!) 53 (!) 58  Resp: 18 18  Temp:    SpO2: 100% 100%    Last Pain:  Vitals:   05/12/22 0908  TempSrc:   PainSc: 0-No pain                 Martha Clan

## 2022-05-16 ENCOUNTER — Encounter: Payer: Self-pay | Admitting: Gastroenterology

## 2022-05-26 ENCOUNTER — Inpatient Hospital Stay: Payer: 59

## 2022-05-28 DIAGNOSIS — J381 Polyp of vocal cord and larynx: Secondary | ICD-10-CM | POA: Diagnosis not present

## 2022-05-28 DIAGNOSIS — R1314 Dysphagia, pharyngoesophageal phase: Secondary | ICD-10-CM | POA: Diagnosis not present

## 2022-05-28 DIAGNOSIS — L74519 Primary focal hyperhidrosis, unspecified: Secondary | ICD-10-CM | POA: Diagnosis not present

## 2022-05-28 DIAGNOSIS — F17200 Nicotine dependence, unspecified, uncomplicated: Secondary | ICD-10-CM | POA: Diagnosis not present

## 2022-05-28 DIAGNOSIS — K219 Gastro-esophageal reflux disease without esophagitis: Secondary | ICD-10-CM | POA: Diagnosis not present

## 2022-05-29 MED FILL — Iron Sucrose Inj 20 MG/ML (Fe Equiv): INTRAVENOUS | Qty: 10 | Status: AC

## 2022-06-02 ENCOUNTER — Inpatient Hospital Stay: Payer: 59

## 2022-06-05 ENCOUNTER — Other Ambulatory Visit: Payer: Self-pay

## 2022-06-05 DIAGNOSIS — Z122 Encounter for screening for malignant neoplasm of respiratory organs: Secondary | ICD-10-CM

## 2022-06-05 DIAGNOSIS — F17210 Nicotine dependence, cigarettes, uncomplicated: Secondary | ICD-10-CM

## 2022-06-05 DIAGNOSIS — Z87891 Personal history of nicotine dependence: Secondary | ICD-10-CM

## 2022-06-09 ENCOUNTER — Ambulatory Visit: Payer: 59

## 2022-06-11 ENCOUNTER — Inpatient Hospital Stay: Payer: BLUE CROSS/BLUE SHIELD | Attending: Oncology

## 2022-06-11 VITALS — BP 109/70 | HR 58 | Temp 97.1°F | Resp 16

## 2022-06-11 DIAGNOSIS — D509 Iron deficiency anemia, unspecified: Secondary | ICD-10-CM | POA: Diagnosis present

## 2022-06-11 DIAGNOSIS — Z79899 Other long term (current) drug therapy: Secondary | ICD-10-CM | POA: Insufficient documentation

## 2022-06-11 MED ORDER — SODIUM CHLORIDE 0.9 % IV SOLN
200.0000 mg | Freq: Once | INTRAVENOUS | Status: AC
  Administered 2022-06-11: 200 mg via INTRAVENOUS
  Filled 2022-06-11: qty 200

## 2022-06-11 MED ORDER — SODIUM CHLORIDE 0.9 % IV SOLN
Freq: Once | INTRAVENOUS | Status: AC
  Filled 2022-06-11: qty 250

## 2022-06-16 ENCOUNTER — Inpatient Hospital Stay: Payer: BLUE CROSS/BLUE SHIELD

## 2022-06-16 VITALS — BP 121/69 | HR 65 | Temp 97.9°F | Resp 15

## 2022-06-16 DIAGNOSIS — D509 Iron deficiency anemia, unspecified: Secondary | ICD-10-CM

## 2022-06-16 MED ORDER — SODIUM CHLORIDE 0.9 % IV SOLN
Freq: Once | INTRAVENOUS | Status: AC
  Filled 2022-06-16: qty 250

## 2022-06-16 MED ORDER — SODIUM CHLORIDE 0.9 % IV SOLN
200.0000 mg | Freq: Once | INTRAVENOUS | Status: AC
  Administered 2022-06-16: 200 mg via INTRAVENOUS
  Filled 2022-06-16: qty 200

## 2022-06-16 NOTE — Patient Instructions (Signed)

## 2022-06-17 ENCOUNTER — Telehealth: Payer: Self-pay | Admitting: Family Medicine

## 2022-06-17 MED ORDER — OSELTAMIVIR PHOSPHATE 75 MG PO CAPS: 75.0000 mg | ORAL_CAPSULE | Freq: Every day | ORAL | 0 refills | Status: DC

## 2022-06-17 NOTE — Telephone Encounter (Signed)
Exposure to flu I the household. Will treat with tamiflu

## 2022-06-30 ENCOUNTER — Ambulatory Visit: Payer: Self-pay | Admitting: Family Medicine

## 2022-06-30 ENCOUNTER — Encounter: Payer: Self-pay | Admitting: Oncology

## 2022-07-03 ENCOUNTER — Encounter: Payer: Self-pay | Admitting: Oncology

## 2022-07-07 ENCOUNTER — Encounter: Payer: Self-pay | Admitting: Oncology

## 2022-07-07 ENCOUNTER — Ambulatory Visit: Payer: 59

## 2022-07-14 ENCOUNTER — Encounter: Payer: Self-pay | Admitting: Acute Care

## 2022-07-14 ENCOUNTER — Ambulatory Visit (INDEPENDENT_AMBULATORY_CARE_PROVIDER_SITE_OTHER): Payer: 59 | Admitting: Acute Care

## 2022-07-14 ENCOUNTER — Encounter: Payer: Self-pay | Admitting: Oncology

## 2022-07-14 DIAGNOSIS — F17210 Nicotine dependence, cigarettes, uncomplicated: Secondary | ICD-10-CM

## 2022-07-14 DIAGNOSIS — R69 Illness, unspecified: Secondary | ICD-10-CM | POA: Diagnosis not present

## 2022-07-14 NOTE — Patient Instructions (Signed)
Thank you for participating in the [facility]. It was our pleasure to meet you today. We will call you with the results of your scan within the next few days. Your scan will be assigned a Lung RADS category score by the physicians reading the scans.  This Lung RADS score determines follow up scanning.  See below for description of categories, and follow up screening recommendations. We will be in touch to schedule your follow up screening annually or based on recommendations of our providers. We will fax a copy of your scan results to your Primary Care Physician, or the physician who referred you to the program, to ensure they have the results. Please call the office if you have any questions or concerns regarding your scanning experience or results.  Our office number is [phone]. Please speak with Denise Phelps, RN. , or  Denise [facility] RN, They are  our Lung Cancer Screening RN.'s If They are unavailable when you call, Please leave a message on the voice mail. We will return your call at our earliest convenience.This voice mail is monitored several times a day.  Remember, if your scan is normal, we will scan you annually as long as you continue to meet the criteria for the program. (Age 50-80, Current smoker or smoker who has quit within the last 15 years). If you are a smoker, remember, quitting is the single most powerful action that you can take to decrease your risk of lung cancer and other pulmonary, breathing related problems. We know quitting is hard, and we are here to help.  Please let us know if there is anything we can do to help you meet your goal of quitting. If you are a former smoker, congratulations. We are proud of you! Remain smoke free! Remember you can refer friends or family members through the number above.  We will screen them to make sure they meet criteria for the program. Thank you for helping us take better care of you by  participating in Lung Screening.  You can receive free nicotine replacement therapy ( patches, gum or mints) by calling 1-800-QUIT NOW. Please call so we can get you on the path to becoming  a non-smoker. I know it is hard, but you can do this!  Lung RADS Categories:  Lung RADS 1: no nodules or definitely non-concerning nodules.  Recommendation is for a repeat annual scan in 12 months.  Lung RADS 2:  nodules that are non-concerning in appearance and behavior with a very low likelihood of becoming an active cancer. Recommendation is for a repeat annual scan in 12 months.  Lung RADS 3: nodules that are probably non-concerning , includes nodules with a low likelihood of becoming an active cancer.  Recommendation is for a 6-month repeat screening scan. Often noted after an upper respiratory illness. We will be in touch to make sure you have no questions, and to schedule your 6-month scan.  Lung RADS 4 A: nodules with concerning findings, recommendation is most often for a follow up scan in 3 months or additional testing based on our provider's assessment of the scan. We will be in touch to make sure you have no questions and to schedule the recommended 3 month follow up scan.  Lung RADS 4 B:  indicates findings that are concerning. We will be in touch with you to schedule additional diagnostic testing based on our provider's  assessment of the scan.  Other options for assistance in smoking cessation (   As covered by your insurance benefits)  Hypnosis for smoking cessation  [employer] [phone]  Acupuncture for smoking cessation  [facility] [phone]   

## 2022-07-14 NOTE — Progress Notes (Signed)
Virtual Visit via Telephone Note  I connected with Hannah Kaiser on 07/14/22 at  3:30 PM EST by telephone and verified that I am speaking with the correct person using two identifiers.  Location: Patient:  At home Provider:  [address], [place], [place], [address]    I discussed the limitations, risks, security and privacy concerns of performing an evaluation and management service by telephone and the availability of in person appointments. I also discussed with the patient that there may be a patient responsible charge related to this service. The patient expressed understanding and agreed to proceed.   Shared Decision Making Visit Lung Cancer Screening Program [phone])   Eligibility: Age 62 y.o. Pack Years Smoking History Calculation 62 pack year smoking history (# packs/per year x # years smoked) Recent History of coughing up blood  no Unexplained weight loss? no ( >Than 15 pounds within the last 6 months ) Prior History Lung / other cancer no (Diagnosis within the last 5 years already requiring surveillance chest CT Scans). Smoking Status Current Smoker Former Smokers: Years since quit:  NA  Quit Date:  NA  Visit Components: Discussion included one or more decision making aids. yes Discussion included risk/benefits of screening. yes Discussion included potential follow up diagnostic testing for abnormal scans. yes Discussion included meaning and risk of over diagnosis. yes Discussion included meaning and risk of False Positives. yes Discussion included meaning of total radiation exposure. yes  Counseling Included: Importance of adherence to annual lung cancer LDCT screening. yes Impact of comorbidities on ability to participate in the program. yes Ability and willingness to under diagnostic treatment. yes  Smoking Cessation Counseling: Current Smokers:  Discussed importance of smoking cessation. yes Information about tobacco cessation classes and interventions  provided to patient. yes Patient provided with "ticket" for LDCT Scan. yes Symptomatic Patient. no  Counseling NA Diagnosis Code: Tobacco Use Z72.0 Asymptomatic Patient yes  Counseling (Intermediate counseling: > three minutes counseling) Z0092 Former Smokers:  Discussed the importance of maintaining cigarette abstinence. yes Diagnosis Code: Personal History of Nicotine Dependence. Z30.076 Information about tobacco cessation classes and interventions provided to patient. Yes Patient provided with "ticket" for LDCT Scan. yes Written Order for Lung Cancer Screening with LDCT placed in Epic. Yes (CT Chest Lung Cancer Screening Low Dose W/O CM) [id number] Z12.2-Screening of respiratory organs Z87.891-Personal history of nicotine dependence  I have spent 25 minutes of face to face/ virtual visit   time with  Hannah Kaiser discussing the risks and benefits of lung cancer screening. We viewed / discussed a power point together that explained in detail the above noted topics. We paused at intervals to allow for questions to be asked and answered to ensure understanding.We discussed that the single most powerful action that she can take to decrease her risk of developing lung cancer is to quit smoking. We discussed whether or not she is ready to commit to setting a quit date. We discussed options for tools to aid in quitting smoking including nicotine replacement therapy, non-nicotine medications, support groups, Quit Smart classes, and behavior modification. We discussed that often times setting smaller, more achievable goals, such as eliminating 1 cigarette a day for a week and then 2 cigarettes a day for a week can be helpful in slowly decreasing the number of cigarettes smoked. This allows for a sense of accomplishment as well as providing a clinical benefit. I provided  her  with smoking cessation  information  with contact information for community resources,  classes, free nicotine replacement therapy, and  access to mobile apps, text messaging, and on-line smoking cessation help. I have also provided  her with  the office contact information in the event she needs to contact me, or the screening staff. We discussed the time and location of the scan, and that either Doroteo Glassman RN, Joella Prince, RN  or I will call / send a letter with the results within 24-72 hours of receiving them. The patient verbalized understanding of all of  the above and had no further questions upon leaving the office. They have my contact information in the event they have any further questions.  I spent 3 minutes counseling on smoking cessation and the health risks of continued tobacco abuse.  I explained to the patient that there has been a high incidence of coronary artery disease noted on these exams. I explained that this is a non-gated exam therefore degree or severity cannot be determined. This patient is on statin therapy. I have asked the patient to follow-up with their PCP regarding any incidental finding of coronary artery disease and management with diet or medication as their PCP  feels is clinically indicated. The patient verbalized understanding of the above and had no further questions upon completion of the visit.      Magdalen Spatz, NP 07/14/2022

## 2022-07-16 ENCOUNTER — Telehealth: Payer: Self-pay

## 2022-07-16 ENCOUNTER — Ambulatory Visit (INDEPENDENT_AMBULATORY_CARE_PROVIDER_SITE_OTHER): Payer: 59 | Admitting: Family Medicine

## 2022-07-16 ENCOUNTER — Encounter: Payer: Self-pay | Admitting: Oncology

## 2022-07-16 ENCOUNTER — Encounter: Payer: Self-pay | Admitting: Family Medicine

## 2022-07-16 VITALS — BP 172/97 | HR 71 | Temp 98.4°F | Ht 65.0 in | Wt 229.8 lb

## 2022-07-16 DIAGNOSIS — M47896 Other spondylosis, lumbar region: Secondary | ICD-10-CM | POA: Diagnosis not present

## 2022-07-16 DIAGNOSIS — Z23 Encounter for immunization: Secondary | ICD-10-CM

## 2022-07-16 DIAGNOSIS — I129 Hypertensive chronic kidney disease with stage 1 through stage 4 chronic kidney disease, or unspecified chronic kidney disease: Secondary | ICD-10-CM | POA: Diagnosis not present

## 2022-07-16 NOTE — Progress Notes (Signed)
BP (!) 172/97 (BP Location: Left Arm, Cuff Size: Normal)   Pulse 71   Temp 98.4 F (36.9 C) (Oral)   Ht '5\' 5"'$  (1.651 m)   Wt 229 lb 12.8 oz (104.2 kg)   SpO2 98%   BMI 38.24 kg/m    Subjective:    Patient ID: Hannah Kaiser, female    DOB: [birth year], 62 y.o.   MRN: [id number]  HPI: Hannah Kaiser is a 62 y.o. female  Chief Complaint  Patient presents with   Spasms   BACK PAIN Duration:  3-4 months Mechanism of injury: no trauma Location: back of both legs Onset: gradual Severity: moderate Quality: kneading Frequency: intermittent Radiation: down the back of both legs L>R Aggravating factors: colonoscopy, moving, standing Alleviating factors: rest Status: better Treatments attempted: muscle relaxors, stretching  Relief with NSAIDs?: moderate Nighttime pain:  yes Paresthesias / decreased sensation:  yes Bowel / bladder incontinence:  no Fevers:  no Dysuria / urinary frequency:  no  HYPERTENSION- did not take her medicine today  Hypertension status: uncontrolled  Satisfied with current treatment? yes Duration of hypertension: chronic BP monitoring frequency:  rarely BP medication side effects:  no Medication compliance: excellent compliance Aspirin: no Recurrent headaches: no Visual changes: no Palpitations: no Dyspnea: no Chest pain: no Lower extremity edema: no Dizzy/lightheaded: no   Relevant past medical, surgical, family and social history reviewed and updated as indicated. Interim medical history since our last visit reviewed. Allergies and medications reviewed and updated.  Review of Systems  Constitutional: Negative.   Respiratory: Negative.    Cardiovascular: Negative.   Musculoskeletal:  Positive for back pain and myalgias. Negative for arthralgias, gait problem, joint swelling, neck pain and neck stiffness.  Skin: Negative.   Neurological:  Positive for numbness. Negative for dizziness, tremors, seizures, syncope, facial asymmetry, speech  difficulty, weakness, light-headedness and headaches.  Psychiatric/Behavioral: Negative.      Per HPI unless specifically indicated above     Objective:    BP (!) 172/97 (BP Location: Left Arm, Cuff Size: Normal)   Pulse 71   Temp 98.4 F (36.9 C) (Oral)   Ht '5\' 5"'$  (1.651 m)   Wt 229 lb 12.8 oz (104.2 kg)   SpO2 98%   BMI 38.24 kg/m   Wt Readings from Last 3 Encounters:  07/16/22 229 lb 12.8 oz (104.2 kg)  05/12/22 233 lb 7.5 oz (105.9 kg)  05/11/22 233 lb 9.6 oz (106 kg)    Physical Exam Vitals and nursing note reviewed.  Constitutional:      General: She is not in acute distress.    Appearance: Normal appearance. She is not ill-appearing, toxic-appearing or diaphoretic.  HENT:     Head: Normocephalic and atraumatic.     Right Ear: External ear normal.     Left Ear: External ear normal.     Nose: Nose normal.     Mouth/Throat:     Mouth: Mucous membranes are moist.     Pharynx: Oropharynx is clear.  Eyes:     General: No scleral icterus.       Right eye: No discharge.        Left eye: No discharge.     Extraocular Movements: Extraocular movements intact.     Conjunctiva/sclera: Conjunctivae normal.     Pupils: Pupils are equal, round, and reactive to light.  Cardiovascular:     Rate and Rhythm: Normal rate and regular rhythm.     Pulses: Normal pulses.  Heart sounds: Normal heart sounds. No murmur heard.    No friction rub. No gallop.  Pulmonary:     Effort: Pulmonary effort is normal. No respiratory distress.     Breath sounds: Normal breath sounds. No stridor. No wheezing, rhonchi or rales.  Chest:     Chest wall: No tenderness.  Musculoskeletal:        General: Normal range of motion.     Cervical back: Normal range of motion and neck supple.  Skin:    General: Skin is warm and dry.     Capillary Refill: Capillary refill takes less than 2 seconds.     Coloration: Skin is not jaundiced or pale.     Findings: No bruising, erythema, lesion or rash.   Neurological:     General: No focal deficit present.     Mental Status: She is alert and oriented to person, place, and time. Mental status is at baseline.  Psychiatric:        Mood and Affect: Mood normal.        Behavior: Behavior normal.        Thought Content: Thought content normal.        Judgment: Judgment normal.     Results for orders placed or performed during the hospital encounter of 05/12/22  Surgical pathology  Result Value Ref Range   SURGICAL PATHOLOGY      SURGICAL PATHOLOGY CASE: [id number] PATIENT: [name] Surgical Pathology Report     Specimen Submitted: A. Colon polyp, ascending; cold snare B. Colon polyp, transverse; cold snare C. Colon polyp, sigmoid; cold snare  Clinical History: History of colon polyps.  Colon polyps    DIAGNOSIS: A. COLON POLYP, ASCENDING; COLD SNARE: - SESSILE SERRATED POLYP. - NEGATIVE FOR DYSPLASIA AND MALIGNANCY.  B. COLON POLYP, TRANSVERSE; COLD SNARE: - TUBULAR ADENOMA. - NEGATIVE FOR HIGH-GRADE DYSPLASIA AND MALIGNANCY.  C. COLON POLYP, SIGMOID; COLD SNARE: - HYPERPLASTIC POLYP. - NEGATIVE FOR DYSPLASIA AND MALIGNANCY.  GROSS DESCRIPTION: A. Labeled: Ascending colon polyp cold snare Received: Formalin Collection time: 8:35 AM on 05/12/2022 Placed into formalin time: 8:35 AM on 05/12/2022 Tissue fragment(s): Multiple Size: Aggregate, 2.2 x 0.5 x 0.1 cm Description: Tan soft tissue fragments Entirely submitted in 1 cassette.  B. Labeled: Transverse colon polyp col d snare Received: Formalin Collection time: 8:40 AM on 05/12/2022 Placed into formalin time: 8:40 AM on 05/12/2022 Tissue fragment(s): 1 Size: 0.8 x 0.3 x 0.1 cm Description: Tan elongated soft tissue fragment Entirely submitted in 1 cassette.  C. Labeled: Sigmoid colon polyp cold snare Received: Formalin Collection time: 8:42 AM on 05/12/2022 Placed into formalin time: 8:42 AM on 05/12/2022 Tissue fragment(s): 1 Size: 0.5 x 0.3 x 0.2  cm Description: Tan soft tissue fragment Entirely submitted in 1 cassette.  RB 05/12/2022  Final Diagnosis performed by Allena Napoleon, MD.   Electronically signed 05/13/2022 8:30:29AM The electronic signature indicates that the named Attending Pathologist has evaluated the specimen Technical component performed at [facility], [address], [place], [place] [address] Lab: [phone] Dir: Rush Farmer, MD, MMM  Professional component performed at [facility], [facility], [place], [place], [place] [address] Lab: [phone] Dir: Kathi Simpers, MD       Assessment & Plan:   Problem List Items Addressed This Visit       Musculoskeletal and Integument   Spinal osteoarthritis    Likely the cause of her pain. Will get her into PT. Recheck 1 month. If not better, will  obtain MRI. Call with any concerns.       Relevant Orders   Ambulatory referral to Physical Therapy     Genitourinary   Benign hypertensive renal disease - Primary    Did not take her medicine today. Encouraged her to take her medicine daily. Call with any concerns. Continue to monitor.       Other Visit Diagnoses     Need for shingles vaccine       Shingles shot given today.        Follow up plan: Return in about 4 weeks (around 08/13/2022) for follow up BP and back.

## 2022-07-16 NOTE — Telephone Encounter (Signed)
Handicap Placard is completed and placed in provider's folder for signature.  

## 2022-07-16 NOTE — Assessment & Plan Note (Signed)
Likely the cause of her pain. Will get her into PT. Recheck 1 month. If not better, will obtain MRI. Call with any concerns.

## 2022-07-16 NOTE — Telephone Encounter (Signed)
-----   Message from Valerie Roys, DO sent at 07/16/2022 11:28 AM EST ----- Handicap placquard for 6 months please

## 2022-07-16 NOTE — Assessment & Plan Note (Signed)
Did not take her medicine today. Encouraged her to take her medicine daily. Call with any concerns. Continue to monitor.

## 2022-07-20 ENCOUNTER — Telehealth: Payer: Self-pay

## 2022-07-20 NOTE — Telephone Encounter (Signed)
Handicap Placard signed and placed up front with front desk staff. Patient notified Handicap Placard is ready for pick up.

## 2022-07-21 ENCOUNTER — Ambulatory Visit
Admission: RE | Admit: 2022-07-21 | Discharge: 2022-07-21 | Disposition: A | Discharge status: Home / Self Care | Payer: 59 | Source: Ambulatory Visit | Attending: Acute Care | Admitting: Acute Care

## 2022-07-21 DIAGNOSIS — Z87891 Personal history of nicotine dependence: Secondary | ICD-10-CM

## 2022-07-21 DIAGNOSIS — F17210 Nicotine dependence, cigarettes, uncomplicated: Secondary | ICD-10-CM | POA: Diagnosis not present

## 2022-07-21 DIAGNOSIS — Z122 Encounter for screening for malignant neoplasm of respiratory organs: Secondary | ICD-10-CM | POA: Diagnosis not present

## 2022-07-21 DIAGNOSIS — I700 Atherosclerosis of aorta: Secondary | ICD-10-CM | POA: Diagnosis not present

## 2022-07-21 DIAGNOSIS — R69 Illness, unspecified: Secondary | ICD-10-CM | POA: Diagnosis not present

## 2022-07-21 DIAGNOSIS — J439 Emphysema, unspecified: Secondary | ICD-10-CM | POA: Insufficient documentation

## 2022-07-31 ENCOUNTER — Telehealth: Payer: Self-pay | Admitting: *Deleted

## 2022-07-31 DIAGNOSIS — Z87891 Personal history of nicotine dependence: Secondary | ICD-10-CM

## 2022-07-31 DIAGNOSIS — R911 Solitary pulmonary nodule: Secondary | ICD-10-CM

## 2022-07-31 NOTE — Telephone Encounter (Signed)
Left message for pt to call back regarding lung screening CT results.  Per Dr Patsey Berthold, need to repeat Chest CT in 6 months to look at nodule again.

## 2022-08-04 ENCOUNTER — Telehealth: Payer: Self-pay | Admitting: Acute Care

## 2022-08-04 NOTE — Telephone Encounter (Signed)
I have attempted to call the patient with the results of their  Low Dose CT Chest Lung cancer screening scan. There was no answer. I have left a HIPPA compliant VM requesting the patient call the office for the scan results. I included the office contact information in the message. We will await his return call. If no return call we will continue to call until patient is contacted.   Pt needs a 6 month follow up LDCT once we get in touch with her.Thanks so much We may need to mail her a letter if she does not call back soon!!

## 2022-08-05 NOTE — Telephone Encounter (Signed)
Letter mailed to home requesting patient to call for LDCT results

## 2022-08-07 ENCOUNTER — Other Ambulatory Visit: Payer: Self-pay | Admitting: *Deleted

## 2022-08-07 DIAGNOSIS — D509 Iron deficiency anemia, unspecified: Secondary | ICD-10-CM

## 2022-08-10 ENCOUNTER — Telehealth: Payer: Self-pay

## 2022-08-10 ENCOUNTER — Inpatient Hospital Stay: Payer: 59

## 2022-08-10 ENCOUNTER — Inpatient Hospital Stay: Payer: 59 | Attending: Oncology

## 2022-08-10 DIAGNOSIS — Z79899 Other long term (current) drug therapy: Secondary | ICD-10-CM | POA: Insufficient documentation

## 2022-08-10 DIAGNOSIS — Z803 Family history of malignant neoplasm of breast: Secondary | ICD-10-CM | POA: Insufficient documentation

## 2022-08-10 DIAGNOSIS — F17210 Nicotine dependence, cigarettes, uncomplicated: Secondary | ICD-10-CM | POA: Insufficient documentation

## 2022-08-10 DIAGNOSIS — G4730 Sleep apnea, unspecified: Secondary | ICD-10-CM | POA: Insufficient documentation

## 2022-08-10 DIAGNOSIS — D509 Iron deficiency anemia, unspecified: Secondary | ICD-10-CM

## 2022-08-10 DIAGNOSIS — K449 Diaphragmatic hernia without obstruction or gangrene: Secondary | ICD-10-CM | POA: Insufficient documentation

## 2022-08-10 DIAGNOSIS — J432 Centrilobular emphysema: Secondary | ICD-10-CM | POA: Insufficient documentation

## 2022-08-10 DIAGNOSIS — Z86010 Personal history of colonic polyps: Secondary | ICD-10-CM | POA: Insufficient documentation

## 2022-08-10 DIAGNOSIS — Z8042 Family history of malignant neoplasm of prostate: Secondary | ICD-10-CM | POA: Insufficient documentation

## 2022-08-10 DIAGNOSIS — Z801 Family history of malignant neoplasm of trachea, bronchus and lung: Secondary | ICD-10-CM | POA: Insufficient documentation

## 2022-08-10 DIAGNOSIS — K219 Gastro-esophageal reflux disease without esophagitis: Secondary | ICD-10-CM | POA: Insufficient documentation

## 2022-08-10 DIAGNOSIS — I509 Heart failure, unspecified: Secondary | ICD-10-CM | POA: Insufficient documentation

## 2022-08-10 DIAGNOSIS — I110 Hypertensive heart disease with heart failure: Secondary | ICD-10-CM | POA: Insufficient documentation

## 2022-08-10 LAB — CBC WITH DIFFERENTIAL/PLATELET
Abs Immature Granulocytes: 0.03 10*3/uL (ref 0.00–0.07)
Basophils Absolute: 0 10*3/uL (ref 0.0–0.1)
Basophils Relative: 1 %
Eosinophils Absolute: 0.1 10*3/uL (ref 0.0–0.5)
Eosinophils Relative: 2 %
HCT: 36.1 % (ref 36.0–46.0)
Hemoglobin: 11.9 g/dL — ABNORMAL LOW (ref 12.0–15.0)
Immature Granulocytes: 1 %
Lymphocytes Relative: 34 %
Lymphs Abs: 1.9 10*3/uL (ref 0.7–4.0)
MCH: 27.7 pg (ref 26.0–34.0)
MCHC: 33 g/dL (ref 30.0–36.0)
MCV: 84.1 fL (ref 80.0–100.0)
Monocytes Absolute: 0.3 10*3/uL (ref 0.1–1.0)
Monocytes Relative: 6 %
Neutro Abs: 3.2 10*3/uL (ref 1.7–7.7)
Neutrophils Relative %: 56 %
Platelets: 217 10*3/uL (ref 150–400)
RBC: 4.29 MIL/uL (ref 3.87–5.11)
RDW: 17.7 % — ABNORMAL HIGH (ref 11.5–15.5)
WBC: 5.6 10*3/uL (ref 4.0–10.5)
nRBC: 0 % (ref 0.0–0.2)

## 2022-08-10 LAB — IRON AND TIBC
Iron: 75 ug/dL (ref 28–170)
Saturation Ratios: 17 % (ref 10.4–31.8)
TIBC: 430 ug/dL (ref 250–450)
UIBC: 355 ug/dL

## 2022-08-10 LAB — FERRITIN: Ferritin: 8 ng/mL — ABNORMAL LOW (ref 11–307)

## 2022-08-10 NOTE — Telephone Encounter (Signed)
Spoke with pt and reviewed CT results. PT advised that we will repeat CT in 6 months to make sure no further growth of lung nodule seen. Pt verbalized understanding and is aware we will call her closer to 6 months to schedule next scan. Results/ plan faxed to PCP. Order placed for 6 mth nodule f/u LCS CT.

## 2022-08-10 NOTE — Telephone Encounter (Signed)
Patient called stating she has to pick her granddaughter up and would like to move lab appointment to 345 or 4p today. Thanks

## 2022-08-11 ENCOUNTER — Encounter: Payer: Self-pay | Admitting: Oncology

## 2022-08-11 ENCOUNTER — Inpatient Hospital Stay (HOSPITAL_BASED_OUTPATIENT_CLINIC_OR_DEPARTMENT_OTHER): Payer: Self-pay | Admitting: Oncology

## 2022-08-11 ENCOUNTER — Inpatient Hospital Stay: Payer: Self-pay

## 2022-08-11 VITALS — BP 146/73 | HR 60 | Temp 97.9°F | Resp 18

## 2022-08-11 VITALS — BP 154/82 | HR 71 | Resp 18 | Wt 232.0 lb

## 2022-08-11 DIAGNOSIS — D509 Iron deficiency anemia, unspecified: Secondary | ICD-10-CM

## 2022-08-11 MED ORDER — SODIUM CHLORIDE 0.9 % IV SOLN
Freq: Once | INTRAVENOUS | Status: AC
  Filled 2022-08-11: qty 250

## 2022-08-11 MED ORDER — SODIUM CHLORIDE 0.9 % IV SOLN
200.0000 mg | Freq: Once | INTRAVENOUS | Status: AC
  Administered 2022-08-11: 200 mg via INTRAVENOUS
  Filled 2022-08-11: qty 200

## 2022-08-11 NOTE — Progress Notes (Signed)
[facility]  Telephone:([phone]) [phone] Fax:(336) [phone]  ID: Kinly Daube Widmayer OB: [birth year]  MR#: [id number]  [id number]  Patient Care Team: Valerie Roys, DO as PCP - General (Family Medicine) Rico Junker, RN as Registered Nurse Christene Lye, MD (General Surgery) Vladimir Crofts, MD (Neurology) Dionisio David, MD as Consulting Physician (Cardiology)  CHIEF COMPLAINT: Iron deficiency anemia.  INTERVAL HISTORY: Patient returns to clinic today for further evaluation and consideration of additional IV Venofer.  She feels improved since receiving her last treatment.  She does not complain of weakness or fatigue today.  She currently feels well and is asymptomatic. She has no neurologic complaints.  She denies any recent fevers or illnesses.  She has a good appetite and denies weight loss.  She has no chest pain, shortness of breath, cough, or hemoptysis.  She denies any nausea, vomiting, constipation, or diarrhea.  She has no melena or hematochezia.  She has no urinary complaints.  Patient offers no specific complaints today.  REVIEW OF SYSTEMS:   Review of Systems  Constitutional: Negative.  Negative for fever, malaise/fatigue and weight loss.  Respiratory: Negative.  Negative for cough, hemoptysis and shortness of breath.   Cardiovascular: Negative.  Negative for chest pain and leg swelling.  Gastrointestinal: Negative.  Negative for abdominal pain, blood in stool and melena.  Genitourinary: Negative.  Negative for hematuria.  Musculoskeletal: Negative.  Negative for back pain.  Skin: Negative.  Negative for rash.  Neurological: Negative.  Negative for dizziness, focal weakness, weakness and headaches.  Psychiatric/Behavioral: Negative.  The patient is not nervous/anxious.     As per HPI. Otherwise, a complete review of systems is negative.  PAST MEDICAL HISTORY: Past Medical History:  Diagnosis Date   Anemia    Anxiety    Arthritis    knees,  thoracic spurs and arthritis   Asthma    Breast pain    CHF (congestive heart failure) (HCC)    Colon polyps    COPD (chronic obstructive pulmonary disease) (HCC)    Depression    Dysrhythmia    GERD (gastroesophageal reflux disease)    Glaucoma    Heart murmur    History of blood transfusion    Hypertension    Motion sickness    cars   Multilevel degenerative disc disease    Neuropathy    Bilateral arms and legs   Nipple discharge    Numbness of both lower extremities    bulging lumbar discs - per pt   Numbness of upper extremity    bilateral - degenerative cervical discs - per pt   Pneumonia    PONV (postoperative nausea and vomiting)    Sleep apnea    has CPAP   Smokers' cough ([place])    Swelling of limb 03/11/2021   Wears dentures    full upper and lower    PAST SURGICAL HISTORY: Past Surgical History:  Procedure Laterality Date   ABDOMINAL HYSTERECTOMY     BACK SURGERY     BELPHAROPTOSIS REPAIR     BROW LIFT Bilateral 03/24/2016   Procedure: BLEPHAROPLASTY;  Surgeon: Karle Starch, MD;  Location: [facility];  Service: Ophthalmology;  Laterality: Bilateral;  sleep apnea   CARDIAC CATHETERIZATION     COLONOSCOPY WITH PROPOFOL N/A 10/19/2016   Procedure: COLONOSCOPY WITH PROPOFOL;  Surgeon: Lucilla Lame, MD;  Location: [facility];  Service: Endoscopy;  Laterality: N/A;   COLONOSCOPY WITH PROPOFOL N/A 05/12/2022   Procedure: COLONOSCOPY  WITH PROPOFOL;  Surgeon: Lucilla Lame, MD;  Location: [facility] ENDOSCOPY;  Service: Endoscopy;  Laterality: N/A;   DILATION AND CURETTAGE OF UTERUS     ECTOPIC PREGNANCY SURGERY     x2   ESOPHAGOGASTRODUODENOSCOPY (EGD) WITH PROPOFOL N/A 10/19/2016   Procedure: ESOPHAGOGASTRODUODENOSCOPY (EGD) WITH PROPOFOL;  Surgeon: Lucilla Lame, MD;  Location: [facility];  Service: Endoscopy;  Laterality: N/A;   LEFT HEART CATH AND CORONARY ANGIOGRAPHY Left 12/29/2016   Procedure: Left Heart Cath and Coronary Angiography;   Surgeon: Dionisio David, MD;  Location: [facility];  Service: Cardiovascular;  Laterality: Left;   NECK SURGERY  2003   POLYPECTOMY  10/19/2016   Procedure: POLYPECTOMY;  Surgeon: Lucilla Lame, MD;  Location: [facility];  Service: Endoscopy;;   [place], 1998   TOTAL ABDOMINAL HYSTERECTOMY W/ BILATERAL SALPINGOOPHORECTOMY  2014   TUBAL LIGATION      FAMILY HISTORY: Family History  Problem Relation Age of Onset   Cancer Maternal Uncle        lung   Cancer Maternal Grandfather        prostate   Cancer Brother        melanoma   Breast cancer Neg Hx     ADVANCED DIRECTIVES (Y/N):  N  HEALTH MAINTENANCE: Social History   Tobacco Use   Smoking status: Every Day    Packs/day: 1.50    Years: 41.00    Total pack years: 61.50    Types: Cigarettes   Smokeless tobacco: Never   Tobacco comments:    since age 73  Vaping Use   Vaping Use: Never used  Substance Use Topics   Alcohol use: No    Alcohol/week: 0.0 standard drinks of alcohol   Drug use: No     Colonoscopy:  PAP:  Bone density:  Lipid panel:  Allergies  Allergen Reactions   Abilify [Aripiprazole] Swelling    Whole  body swelling, retains fluid   Ace Inhibitors Cough    So bad she vomited Other reaction(s): Cough So bad she vomited Other reaction(s): Cough So bad she vomited    Wellbutrin [Bupropion] Other (See Comments)    Worsened depression   Paxlovid [Nirmatrelvir-Ritonavir] Other (See Comments)    Ulcers in mouth   Azithromycin Rash   Erythromycin Rash    Current Outpatient Medications  Medication Sig Dispense Refill   albuterol (VENTOLIN HFA) 108 (90 Base) MCG/ACT inhaler Inhale 2 puffs into the lungs every 6 (six) hours as needed for wheezing or shortness of breath. 8 g 6   amLODipine (NORVASC) 10 MG tablet Take 1 tablet (10 mg total) by mouth daily. 90 tablet 1   atenolol (TENORMIN) 50 MG tablet Take 1 tablet (50 mg total) by mouth daily. 90 tablet 1   baclofen  (LIORESAL) 10 MG tablet Take 1 tablet (10 mg total) by mouth at bedtime as needed for muscle spasms. 30 each 0   busPIRone (BUSPAR) 15 MG tablet Take 1 tablet (15 mg total) by mouth 3 (three) times daily. 270 tablet 1   clonazePAM (KLONOPIN) 1 MG tablet Take 1 tablet (1 mg total) by mouth 2 (two) times daily as needed. for anxiety 30 tablet 0   ezetimibe (ZETIA) 10 MG tablet Take 1 tablet (10 mg total) by mouth daily. 90 tablet 1   FLUoxetine (PROZAC) 40 MG capsule Take 2 capsules (80 mg total) by mouth daily. 180 capsule 1   furosemide (LASIX) 20 MG tablet TAKE 1 TABLET  BY MOUTH ONCE DAILY AS NEEDED FOR SWELLING 60 tablet 0   hydrOXYzine (ATARAX) 25 MG tablet Take 1 tablet (25 mg total) by mouth 3 (three) times daily as needed. 90 tablet 1   ibuprofen (ADVIL) 800 MG tablet Take 1 tablet (800 mg total) by mouth every 8 (eight) hours as needed. 270 tablet 1   isosorbide mononitrate (IMDUR) 30 MG 24 hr tablet Take 1 tablet (30 mg total) by mouth daily. 90 tablet 1   latanoprost (XALATAN) 0.005 % ophthalmic solution Place 1 drop into both eyes at bedtime.     losartan (COZAAR) 100 MG tablet Take 1 tablet (100 mg total) by mouth daily. 90 tablet 1   magic mouthwash (lidocaine, diphenhydrAMINE, alum & mag hydroxide) suspension Swish and spit 5 mLs 3 (three) times daily as needed for mouth pain. 360 mL 1   mupirocin ointment (BACTROBAN) 2 % Apply 1 application. topically 2 (two) times daily. 22 g 0   naproxen (NAPROSYN) 500 MG tablet Take 1 tablet (500 mg total) by mouth 2 (two) times daily with a meal. 90 tablet 1   nystatin-triamcinolone ointment (MYCOLOG) Apply 1 application topically 2 (two) times daily. 30 g 1   pantoprazole (PROTONIX) 40 MG tablet Take 1 tablet (40 mg total) by mouth 2 (two) times daily. 180 tablet 1   QUEtiapine (SEROQUEL) 25 MG tablet Take 1 tablet (25 mg total) by mouth at bedtime. 90 tablet 1   rosuvastatin (CRESTOR) 5 MG tablet Take 1 tablet (5 mg total) by mouth every other  day. 90 tablet 1   triamcinolone ointment (KENALOG) 0.5 % Apply 1 application topically 2 (two) times daily. 30 g 0   valACYclovir (VALTREX) 1000 MG tablet Take 1 tablet (1,000 mg total) by mouth daily. 180 tablet 1   No current facility-administered medications for this visit.   Facility-Administered Medications Ordered in Other Visits  Medication Dose Route Frequency Provider Last Rate Last Admin   iron sucrose (VENOFER) 200 mg in sodium chloride 0.9 % 100 mL IVPB  200 mg Intravenous Once Lloyd Huger, MD        OBJECTIVE: Vitals:   08/11/22 1400  BP: (!) 154/82  Pulse: 71  Resp: 18  SpO2: 99%     Body mass index is 38.61 kg/m.    ECOG FS:0 - Asymptomatic  General: Well-developed, well-nourished, no acute distress. Eyes: Pink conjunctiva, anicteric sclera. HEENT: Normocephalic, moist mucous membranes. Lungs: No audible wheezing or coughing. Heart: Regular rate and rhythm. Abdomen: Soft, nontender, no obvious distention. Musculoskeletal: No edema, cyanosis, or clubbing. Neuro: Alert, answering all questions appropriately. Cranial nerves grossly intact. Skin: No rashes or petechiae noted. Psych: Normal affect.  LAB RESULTS:  Lab Results  Component Value Date   NA 146 (H) 05/06/2022   K 4.7 05/06/2022   CL 109 (H) 05/06/2022   CO2 17 (L) 05/06/2022   GLUCOSE 95 05/06/2022   BUN 18 05/06/2022   CREATININE 0.77 05/06/2022   CALCIUM 9.6 05/06/2022   PROT 6.8 05/06/2022   ALBUMIN 4.7 05/06/2022   AST 19 05/06/2022   ALT 9 05/06/2022   ALKPHOS 78 05/06/2022   BILITOT 0.7 05/06/2022   GFRNONAA 53 (L) 03/18/2018   GFRAA 61 03/18/2018    Lab Results  Component Value Date   WBC 5.6 08/10/2022   NEUTROABS 3.2 08/10/2022   HGB 11.9 (L) 08/10/2022   HCT 36.1 08/10/2022   MCV 84.1 08/10/2022   PLT 217 08/10/2022   Lab Results  Component Value  Date   IRON 75 08/10/2022   TIBC 430 08/10/2022   IRONPCTSAT 17 08/10/2022   Lab Results  Component Value Date    FERRITIN 8 (L) 08/10/2022     STUDIES: CT CHEST LUNG CA SCREEN LOW DOSE W/O CM  Result Date: 07/21/2022 CLINICAL DATA:  Current smoker with 62 pack-year history EXAM: CT CHEST WITHOUT CONTRAST LOW-DOSE FOR LUNG CANCER SCREENING TECHNIQUE: Multidetector CT imaging of the chest was performed following the standard protocol without IV contrast. RADIATION DOSE REDUCTION: This exam was performed according to the departmental dose-optimization program which includes automated exposure control, adjustment of the mA and/or kV according to patient size and/or use of iterative reconstruction technique. COMPARISON:  Chest CT dated March 24, 2021 FINDINGS: Cardiovascular: Normal heart size. No pericardial effusion. Severe coronary artery calcifications. Normal caliber thoracic aorta with moderate calcified plaque. Mediastinum/Nodes: Small hiatal hernia. No pathologically enlarged lymph nodes seen in the chest. Lungs/Pleura: Central airways are patent. Mild centrilobular emphysema. No consolidation, pleural effusion or pneumothorax. Ground-glass pulmonary nodule of the left upper lobe measuring 9 mm on series 3, image 52, previously 7 mm, lung rads 2 due to ground-glass morphology and slow growth. Upper Abdomen: Scattered small low-attenuation liver lesions unchanged when compared with prior and likely simple cysts. Musculoskeletal: No chest wall mass or suspicious bone lesions identified. IMPRESSION: 1. Lung-RADS 2, benign appearance or behavior. Continue annual screening with low-dose chest CT without contrast in 12 months. 2. Aortic Atherosclerosis (ICD10-I70.0) and Emphysema (ICD10-J43.9). Electronically Signed   By: Yetta Glassman M.D.   On: 07/21/2022 19:55   ASSESSMENT: Iron deficiency anemia.  PLAN:    Iron deficiency anemia: Patient's hemoglobin has improved and is now nearly within normal limits of 11.9.  She has a mildly decreased ferritin, but the remainder of her iron stores are within normal  limits.  She cannot tolerate oral iron supplementation.  Colonoscopy on May 12, 2022 removed several polyps, but otherwise no significant pathology.  Proceed with Venofer today.  Patient does not require any additional doses at this time.  Return to clinic in 4 months with repeat laboratory work, further evaluation, and consideration of additional treatment if needed.    I spent a total of 30 minutes reviewing chart data, face-to-face evaluation with the patient, counseling and coordination of care as detailed above.   Patient expressed understanding and was in agreement with this plan. She also understands that She can call clinic at any time with any questions, concerns, or complaints.    Lloyd Huger, MD   08/11/2022 2:48 PM

## 2022-08-13 ENCOUNTER — Other Ambulatory Visit: Payer: Self-pay | Admitting: Family Medicine

## 2022-08-13 NOTE — Telephone Encounter (Signed)
Requested Prescriptions  Pending Prescriptions Disp Refills   baclofen (LIORESAL) 10 MG tablet [Pharmacy Med Name: BACLOFEN 10 MG TABLET] 30 tablet 0    Sig: TAKE 1 TABLET BY MOUTH AT BEDTIME FOR MUSCLE SPASM     Analgesics:  Muscle Relaxants - baclofen Passed - 08/13/2022  1:40 AM      Passed - Cr in normal range and within 180 days    Creatinine  Date Value Ref Range Status  08/30/2013 0.98 0.60 - 1.30 mg/dL Final   Creatinine, Ser  Date Value Ref Range Status  05/06/2022 0.77 0.57 - 1.00 mg/dL Final         Passed - eGFR is 30 or above and within 180 days    EGFR (African American)  Date Value Ref Range Status  08/30/2013 >60  Final   GFR calc Af Amer  Date Value Ref Range Status  03/18/2018 61 >59 mL/min/1.73 Final   EGFR (Non-African Amer.)  Date Value Ref Range Status  08/30/2013 >60  Final    Comment:    eGFR values <58m/min/1.73 m2 may be an indication of chronic kidney disease (CKD). Calculated eGFR is useful in patients with stable renal function. The eGFR calculation will not be reliable in acutely ill patients when serum creatinine is changing rapidly. It is not useful in  patients on dialysis. The eGFR calculation may not be applicable to patients at the low and high extremes of body sizes, pregnant women, and vegetarians.    GFR calc non Af Amer  Date Value Ref Range Status  03/18/2018 53 (L) >59 mL/min/1.73 Final   eGFR  Date Value Ref Range Status  05/06/2022 89 >59 mL/min/1.73 Final         Passed - Valid encounter within last 6 months    Recent Outpatient Visits           4 weeks ago Benign hypertensive renal disease   [facility]   3 months ago Numbness and tingling of lower extremity   [facility] Mec[facility], EDani Gobble PA-C   4 months ago Routine general medical examination at a health care facility   [place]   4 months ago  Severe episode of recurrent major depressive disorder, without psychotic features ([facility]   [place], [place]   10 months ago Parotid gland enlargement   [place] Mecum, EDani Gobble PA-C       Future Appointments             In 5 days [facility]erie Roys DO [facility]   In 1 month J[place]ynetta Emery [place]b Merino DO [facility]  In 6 months KRalene Bathe MD [facility]

## 2022-08-18 ENCOUNTER — Encounter: Payer: Self-pay | Admitting: Family Medicine

## 2022-08-18 ENCOUNTER — Ambulatory Visit (INDEPENDENT_AMBULATORY_CARE_PROVIDER_SITE_OTHER): Payer: Self-pay | Admitting: Family Medicine

## 2022-08-18 VITALS — BP 134/77 | HR 60 | Temp 98.6°F | Ht 65.0 in | Wt 230.4 lb

## 2022-08-18 DIAGNOSIS — I129 Hypertensive chronic kidney disease with stage 1 through stage 4 chronic kidney disease, or unspecified chronic kidney disease: Secondary | ICD-10-CM

## 2022-08-18 DIAGNOSIS — F419 Anxiety disorder, unspecified: Secondary | ICD-10-CM

## 2022-08-18 DIAGNOSIS — R110 Nausea: Secondary | ICD-10-CM

## 2022-08-18 DIAGNOSIS — M47896 Other spondylosis, lumbar region: Secondary | ICD-10-CM

## 2022-08-18 DIAGNOSIS — R350 Frequency of micturition: Secondary | ICD-10-CM

## 2022-08-18 LAB — URINALYSIS, ROUTINE W REFLEX MICROSCOPIC
Bilirubin, UA: NEGATIVE
Glucose, UA: NEGATIVE
Ketones, UA: NEGATIVE
Leukocytes,UA: NEGATIVE
Nitrite, UA: NEGATIVE
Protein,UA: NEGATIVE
RBC, UA: NEGATIVE
Specific Gravity, UA: 1.015 (ref 1.005–1.030)
Urobilinogen, Ur: 0.2 mg/dL (ref 0.2–1.0)
pH, UA: 6.5 (ref 5.0–7.5)

## 2022-08-18 MED ORDER — ONDANSETRON HCL 4 MG PO TABS: 4.0000 mg | ORAL_TABLET | Freq: Three times a day (TID) | ORAL | 3 refills | Status: DC | PRN

## 2022-08-18 MED ORDER — QUETIAPINE FUMARATE 25 MG PO TABS: 25.0000 mg | ORAL_TABLET | Freq: Every day | ORAL | 1 refills | Status: DC

## 2022-08-18 NOTE — Assessment & Plan Note (Signed)
Has not been able to get into PT yet- checking on referral today. Will do PT and if not better may need MRI. Call with any concerns.

## 2022-08-18 NOTE — Progress Notes (Signed)
BP 134/77 (BP Location: Left Arm, Cuff Size: Normal)   Pulse 60   Temp 98.6 F (37 C) (Oral)   Ht '5\' 5"'$  (1.651 m)   Wt 230 lb 6.4 oz (104.5 kg)   SpO2 98%   BMI 38.34 kg/m    Subjective:    Patient ID: Hannah Kaiser, female    DOB: 1961-04-21, 62 y.o.   MRN: [id number]  HPI: Hannah Kaiser is a 62 y.o. female  Chief Complaint  Patient presents with   Hypertension   Back Pain   BACK PAIN Duration: 4-5 months Mechanism of injury: no trauma Location: back and back of both legs Onset: gradual Severity: moderate Quality: kneeding Frequency: intermittent Radiation: down the back of both legs L>R Aggravating factors: colonoscopy, moving standing Alleviating factors: rest Status: stable Treatments attempted: muscle relaxers, stretching  Relief with NSAIDs?: moderate Nighttime pain:  yes Paresthesias / decreased sensation:  yes Bowel / bladder incontinence:  no Fevers:  no Dysuria / urinary frequency:  yes  HYPERTENSION  Hypertension status: controlled  Satisfied with current treatment? yes Duration of hypertension: chronic BP monitoring frequency:  not checking BP medication side effects:  no Medication compliance: excellent compliance Aspirin: no Recurrent headaches: no Visual changes: no Palpitations: no Dyspnea: no Chest pain: no Lower extremity edema: no Dizzy/lightheaded: no   Relevant past medical, surgical, family and social history reviewed and updated as indicated. Interim medical history since our last visit reviewed. Allergies and medications reviewed and updated.  Review of Systems  Constitutional: Negative.   Respiratory: Negative.    Cardiovascular: Negative.   Musculoskeletal:  Positive for back pain and myalgias. Negative for arthralgias, gait problem, joint swelling, neck pain and neck stiffness.  Skin: Negative.   Neurological: Negative.   Psychiatric/Behavioral:  Positive for dysphoric mood. Negative for agitation, behavioral problems,  confusion, decreased concentration, hallucinations, self-injury, sleep disturbance and suicidal ideas. The patient is nervous/anxious. The patient is not hyperactive.     Per HPI unless specifically indicated above     Objective:    BP 134/77 (BP Location: Left Arm, Cuff Size: Normal)   Pulse 60   Temp 98.6 F (37 C) (Oral)   Ht '5\' 5"'$  (1.651 m)   Wt 230 lb 6.4 oz (104.5 kg)   SpO2 98%   BMI 38.34 kg/m   Wt Readings from Last 3 Encounters:  08/18/22 230 lb 6.4 oz (104.5 kg)  08/11/22 232 lb (105.2 kg)  07/16/22 229 lb 12.8 oz (104.2 kg)    Physical Exam Vitals and nursing note reviewed.  Constitutional:      General: She is not in acute distress.    Appearance: Normal appearance. She is obese. She is not ill-appearing, toxic-appearing or diaphoretic.  HENT:     Head: Normocephalic and atraumatic.     Right Ear: External ear normal.     Left Ear: External ear normal.     Nose: Nose normal.     Mouth/Throat:     Mouth: Mucous membranes are moist.     Pharynx: Oropharynx is clear.  Eyes:     General: No scleral icterus.       Right eye: No discharge.        Left eye: No discharge.     Extraocular Movements: Extraocular movements intact.     Conjunctiva/sclera: Conjunctivae normal.     Pupils: Pupils are equal, round, and reactive to light.  Cardiovascular:     Rate and Rhythm: Normal rate and regular rhythm.  Pulses: Normal pulses.     Heart sounds: Normal heart sounds. No murmur heard.    No friction rub. No gallop.  Pulmonary:     Effort: Pulmonary effort is normal. No respiratory distress.     Breath sounds: Normal breath sounds. No stridor. No wheezing, rhonchi or rales.  Chest:     Chest wall: No tenderness.  Musculoskeletal:        General: Normal range of motion.     Cervical back: Normal range of motion and neck supple.  Skin:    General: Skin is warm and dry.     Capillary Refill: Capillary refill takes less than 2 seconds.     Coloration: Skin is not  jaundiced or pale.     Findings: No bruising, erythema, lesion or rash.  Neurological:     General: No focal deficit present.     Mental Status: She is alert and oriented to person, place, and time. Mental status is at baseline.  Psychiatric:        Mood and Affect: Mood normal.        Behavior: Behavior normal.        Thought Content: Thought content normal.        Judgment: Judgment normal.     Results for orders placed or performed in visit on 08/10/22  Iron and TIBC([facility])  Result Value Ref Range   Iron 75 28 - 170 ug/dL   TIBC 430 250 - 450 ug/dL   Saturation Ratios 17 10.4 - 31.8 %   UIBC 355 ug/dL  Ferritin  Result Value Ref Range   Ferritin 8 (L) 11 - 307 ng/mL  CBC with Differential/Platelet  Result Value Ref Range   WBC 5.6 4.0 - 10.5 K/uL   RBC 4.29 3.87 - 5.11 MIL/uL   Hemoglobin 11.9 (L) 12.0 - 15.0 g/dL   HCT 36.1 36.0 - 46.0 %   MCV 84.1 80.0 - 100.0 fL   MCH 27.7 26.0 - 34.0 pg   MCHC 33.0 30.0 - 36.0 g/dL   RDW 17.7 (H) 11.5 - 15.5 %   Platelets 217 150 - 400 K/uL   nRBC 0.0 0.0 - 0.2 %   Neutrophils Relative % 56 %   Neutro Abs 3.2 1.7 - 7.7 K/uL   Lymphocytes Relative 34 %   Lymphs Abs 1.9 0.7 - 4.0 K/uL   Monocytes Relative 6 %   Monocytes Absolute 0.3 0.1 - 1.0 K/uL   Eosinophils Relative 2 %   Eosinophils Absolute 0.1 0.0 - 0.5 K/uL   Basophils Relative 1 %   Basophils Absolute 0.0 0.0 - 0.1 K/uL   Immature Granulocytes 1 %   Abs Immature Granulocytes 0.03 0.00 - 0.07 K/uL      Assessment & Plan:   Problem List Items Addressed This Visit       Musculoskeletal and Integument   Spinal osteoarthritis - Primary    Has not been able to get into PT yet- checking on referral today. Will do PT and if not better may need MRI. Call with any concerns.         Genitourinary   Benign hypertensive renal disease    Under good control on current regimen. Continue current regimen. Continue to monitor. Call with any concerns. Refills up to  date.          Other   Anxiety    Anxiety acting up. Will increase her seroquel to 1-2 tabs QHS PRN. Call with any concerns. Recheck  1 month.       Other Visit Diagnoses     Nausea       Will treat with zofran and check labs. Await results. Treat as needed.   Relevant Orders   Comprehensive metabolic panel   Frequency of urination       UA clear. Increase fluids. Call with any concerns.   Relevant Orders   Urinalysis, Routine w reflex microscopic   Comprehensive metabolic panel        Follow up plan: Return in about 4 weeks (around 09/15/2022).

## 2022-08-18 NOTE — Assessment & Plan Note (Signed)
Anxiety acting up. Will increase her seroquel to 1-2 tabs QHS PRN. Call with any concerns. Recheck 1 month.

## 2022-08-18 NOTE — Assessment & Plan Note (Signed)
Under good control on current regimen. Continue current regimen. Continue to monitor. Call with any concerns. Refills up to date.   

## 2022-08-24 ENCOUNTER — Encounter: Payer: Self-pay | Admitting: Oncology

## 2022-08-25 ENCOUNTER — Encounter: Payer: Self-pay | Admitting: Oncology

## 2022-08-25 ENCOUNTER — Telehealth: Payer: Self-pay | Admitting: Physical Therapy

## 2022-08-25 NOTE — Telephone Encounter (Signed)
Called pt to inquire if she wanted to move up in schedule. Pt states that she would be unable to come in earlier because of work. Physical therapist reminded patient to bring insurance in because nothing was on file.

## 2022-08-27 ENCOUNTER — Ambulatory Visit: Payer: Self-pay | Admitting: Physical Therapy

## 2022-09-01 ENCOUNTER — Telehealth: Payer: Self-pay | Admitting: Physical Therapy

## 2022-09-01 ENCOUNTER — Ambulatory Visit: Payer: Self-pay | Admitting: Physical Therapy

## 2022-09-01 NOTE — Telephone Encounter (Signed)
Called pt to inform her about insurance ending in March and that she would not be covered by insurance for upcoming apt. Pt decided to cancel 09/01/22 to check about insurance and she would call back once she knew more about insurance. She plans on keeping next appointment on 09/03/22 and making this her eval.

## 2022-09-03 ENCOUNTER — Ambulatory Visit: Payer: Self-pay | Admitting: Physical Therapy

## 2022-09-08 ENCOUNTER — Ambulatory Visit: Payer: Self-pay | Admitting: Physical Therapy

## 2022-09-09 ENCOUNTER — Other Ambulatory Visit: Payer: Self-pay | Admitting: Family Medicine

## 2022-09-09 NOTE — Telephone Encounter (Signed)
Requested Prescriptions  Pending Prescriptions Disp Refills   baclofen (LIORESAL) 10 MG tablet [Pharmacy Med Name: BACLOFEN 10 MG TABLET] 30 tablet 0    Sig: TAKE 1 TABLET BY MOUTH EVERY DAY AT BEDTIME FOR MUSCLE SPASMS     Analgesics:  Muscle Relaxants - baclofen Passed - 09/09/2022  8:32 AM      Passed - Cr in normal range and within 180 days    Creatinine  Date Value Ref Range Status  08/30/2013 0.98 0.60 - 1.30 mg/dL Final   Creatinine, Ser  Date Value Ref Range Status  05/06/2022 0.77 0.57 - 1.00 mg/dL Final         Passed - eGFR is 30 or above and within 180 days    EGFR (African American)  Date Value Ref Range Status  08/30/2013 >60  Final   GFR calc Af Amer  Date Value Ref Range Status  03/18/2018 61 >59 mL/min/1.73 Final   EGFR (Non-African Amer.)  Date Value Ref Range Status  08/30/2013 >60  Final    Comment:    eGFR values <109mL/min/1.73 m2 may be an indication of chronic kidney disease (CKD). Calculated eGFR is useful in patients with stable renal function. The eGFR calculation will not be reliable in acutely ill patients when serum creatinine is changing rapidly. It is not useful in  patients on dialysis. The eGFR calculation may not be applicable to patients at the low and high extremes of body sizes, pregnant women, and vegetarians.    GFR calc non Af Amer  Date Value Ref Range Status  03/18/2018 53 (L) >59 mL/min/1.73 Final   eGFR  Date Value Ref Range Status  05/06/2022 89 >59 mL/min/1.73 Final         Passed - Valid encounter within last 6 months    Recent Outpatient Visits           3 weeks ago Other osteoarthritis of spine, lumbar region   [place], [place], DO   1 month ago Benign hypertensive renal disease   [place], Megan P, DO   4 months ago Numbness and tingling of lower extremity   [place] [facility] Mecum, Dani [place], PA-C   5 months ago Routine  general medical examination at a health care facility   [place] P, DO   5 months ago Severe episode of recurrent major depressive disorder, without psychotic features [facility])   [place], [place], DO       Future Appointments             In 3 weeks Wynetta Emery, Barb Merino, DO [place], [place]   In 5 months Ralene Bathe, MD [place]

## 2022-09-11 ENCOUNTER — Encounter: Payer: Self-pay | Admitting: Oncology

## 2022-09-13 ENCOUNTER — Other Ambulatory Visit: Payer: Self-pay | Admitting: Family Medicine

## 2022-09-14 ENCOUNTER — Encounter: Payer: Self-pay | Admitting: Oncology

## 2022-09-15 ENCOUNTER — Encounter: Payer: Self-pay | Admitting: Oncology

## 2022-09-15 ENCOUNTER — Encounter: Payer: 59 | Admitting: Physical Therapy

## 2022-09-15 NOTE — Telephone Encounter (Signed)
Requested Prescriptions  Pending Prescriptions Disp Refills   hydrOXYzine (ATARAX) 25 MG tablet [Pharmacy Med Name: HYDROXYZINE HCL 25 MG TABLET] 270 tablet 0    Sig: TAKE 1 TABLET BY MOUTH 3 TIMES A DAY AS NEEDED     Ear, Nose, and Throat:  Antihistamines 2 Passed - 09/13/2022  9:39 AM      Passed - Cr in normal range and within 360 days    Creatinine  Date Value Ref Range Status  08/30/2013 0.98 0.60 - 1.30 mg/dL Final   Creatinine, Ser  Date Value Ref Range Status  05/06/2022 0.77 0.57 - 1.00 mg/dL Final         Passed - Valid encounter within last 12 months    Recent Outpatient Visits           4 weeks ago Other osteoarthritis of spine, lumbar region   [facility] [place], Megan P, DO   2 months ago Benign hypertensive renal disease   [place] [facility] [place], Megan P, DO   4 months ago Numbness and tingling of lower extremity   [place] [facility] Mecum, Oswaldo Conroy, PA-C   5 months ago Routine general medical examination at a health care facility   [facility], [place] P, DO   5 months ago Severe episode of recurrent major depressive disorder, without psychotic features [facility])   [place] [facility] Dorcas Carrow, DO       Future Appointments             In 3 weeks Laural Benes, Oralia Rud, DO [place] [facility]   In 5 months Deirdre Evener, MD [facility]

## 2022-09-17 ENCOUNTER — Encounter: Payer: 59 | Admitting: Physical Therapy

## 2022-09-22 ENCOUNTER — Encounter: Payer: 59 | Admitting: Physical Therapy

## 2022-09-23 ENCOUNTER — Encounter: Payer: Self-pay | Admitting: Oncology

## 2022-09-24 ENCOUNTER — Encounter: Payer: 59 | Admitting: Physical Therapy

## 2022-09-29 ENCOUNTER — Encounter: Payer: 59 | Admitting: Physical Therapy

## 2022-10-01 ENCOUNTER — Encounter: Payer: 59 | Admitting: Physical Therapy

## 2022-10-03 ENCOUNTER — Other Ambulatory Visit: Payer: Self-pay | Admitting: Family Medicine

## 2022-10-05 NOTE — Telephone Encounter (Signed)
Requested Prescriptions  Pending Prescriptions Disp Refills   baclofen (LIORESAL) 10 MG tablet [Pharmacy Med Name: BACLOFEN 10 MG TABLET] 90 tablet 0    Sig: TAKE 1 TABLET BY MOUTH EVERY DAY AT BEDTIME FOR MUSCLE SPASMS     Analgesics:  Muscle Relaxants - baclofen Passed - 10/03/2022 11:31 AM      Passed - Cr in normal range and within 180 days    Creatinine  Date Value Ref Range Status  08/30/2013 0.98 0.60 - 1.30 mg/dL Final   Creatinine, Ser  Date Value Ref Range Status  05/06/2022 0.77 0.57 - 1.00 mg/dL Final         Passed - eGFR is 30 or above and within 180 days    EGFR (African American)  Date Value Ref Range Status  08/30/2013 >60  Final   GFR calc Af Amer  Date Value Ref Range Status  03/18/2018 61 >59 mL/min/1.73 Final   EGFR (Non-African Amer.)  Date Value Ref Range Status  08/30/2013 >60  Final    Comment:    eGFR values <38mL/min/1.73 m2 may be an indication of chronic kidney disease (CKD). Calculated eGFR is useful in patients with stable renal function. The eGFR calculation will not be reliable in acutely ill patients when serum creatinine is changing rapidly. It is not useful in  patients on dialysis. The eGFR calculation may not be applicable to patients at the low and high extremes of body sizes, pregnant women, and vegetarians.    GFR calc non Af Amer  Date Value Ref Range Status  03/18/2018 53 (L) >59 mL/min/1.73 Final   eGFR  Date Value Ref Range Status  05/06/2022 89 >59 mL/min/1.73 Final         Passed - Valid encounter within last 6 months    Recent Outpatient Visits           1 month ago Other osteoarthritis of spine, lumbar region   [facility] [place], Megan P, DO   2 months ago Benign hypertensive renal disease   [place] [facility] [place], Megan P, DO   5 months ago Numbness and tingling of lower extremity   [place] [facility] Mecum, Oswaldo Conroy, PA-C   6 months ago Routine  general medical examination at a health care facility   [facility], [place] P, DO   6 months ago Severe episode of recurrent major depressive disorder, without psychotic features [facility])   [facility] [place], Oralia Rud, DO       Future Appointments             Tomorrow Dorcas Carrow, DO [place] [facility]   In 4 months Deirdre Evener, MD [facility]

## 2022-10-06 ENCOUNTER — Encounter: Payer: Self-pay | Admitting: Family Medicine

## 2022-10-06 ENCOUNTER — Ambulatory Visit (INDEPENDENT_AMBULATORY_CARE_PROVIDER_SITE_OTHER): Payer: Medicaid Other | Admitting: Family Medicine

## 2022-10-06 VITALS — BP 132/82 | HR 61 | Temp 98.5°F | Ht 65.0 in | Wt 231.9 lb

## 2022-10-06 DIAGNOSIS — E782 Mixed hyperlipidemia: Secondary | ICD-10-CM

## 2022-10-06 DIAGNOSIS — F332 Major depressive disorder, recurrent severe without psychotic features: Secondary | ICD-10-CM | POA: Diagnosis not present

## 2022-10-06 DIAGNOSIS — Z23 Encounter for immunization: Secondary | ICD-10-CM

## 2022-10-06 DIAGNOSIS — I2510 Atherosclerotic heart disease of native coronary artery without angina pectoris: Secondary | ICD-10-CM

## 2022-10-06 DIAGNOSIS — I129 Hypertensive chronic kidney disease with stage 1 through stage 4 chronic kidney disease, or unspecified chronic kidney disease: Secondary | ICD-10-CM | POA: Diagnosis not present

## 2022-10-06 DIAGNOSIS — F419 Anxiety disorder, unspecified: Secondary | ICD-10-CM | POA: Diagnosis not present

## 2022-10-06 MED ORDER — BUSPIRONE HCL 15 MG PO TABS: 15.0000 mg | ORAL_TABLET | Freq: Three times a day (TID) | ORAL | 1 refills | Status: DC

## 2022-10-06 MED ORDER — PANTOPRAZOLE SODIUM 40 MG PO TBEC: 40.0000 mg | DELAYED_RELEASE_TABLET | Freq: Two times a day (BID) | ORAL | 1 refills | Status: DC

## 2022-10-06 MED ORDER — HYDROXYZINE HCL 25 MG PO TABS: 25.0000 mg | ORAL_TABLET | Freq: Three times a day (TID) | ORAL | 1 refills | Status: DC | PRN

## 2022-10-06 MED ORDER — LOSARTAN POTASSIUM 100 MG PO TABS: 100.0000 mg | ORAL_TABLET | Freq: Every day | ORAL | 1 refills | Status: DC

## 2022-10-06 MED ORDER — FLUOXETINE HCL 40 MG PO CAPS: 80.0000 mg | ORAL_CAPSULE | Freq: Every day | ORAL | 1 refills | Status: DC

## 2022-10-06 MED ORDER — AMLODIPINE BESYLATE 10 MG PO TABS: 10.0000 mg | ORAL_TABLET | Freq: Every day | ORAL | 1 refills | Status: DC

## 2022-10-06 MED ORDER — VALACYCLOVIR HCL 1 G PO TABS: 1000.0000 mg | ORAL_TABLET | Freq: Every day | ORAL | 1 refills | Status: DC

## 2022-10-06 MED ORDER — QUETIAPINE FUMARATE 25 MG PO TABS: 25.0000 mg | ORAL_TABLET | Freq: Every day | ORAL | 1 refills | Status: DC

## 2022-10-06 MED ORDER — CLONAZEPAM 1 MG PO TABS: 1.0000 mg | ORAL_TABLET | Freq: Two times a day (BID) | ORAL | 0 refills | Status: DC | PRN

## 2022-10-06 MED ORDER — ROSUVASTATIN CALCIUM 5 MG PO TABS: 5.0000 mg | ORAL_TABLET | ORAL | 1 refills | Status: DC

## 2022-10-06 MED ORDER — ATENOLOL 50 MG PO TABS: 50.0000 mg | ORAL_TABLET | Freq: Every day | ORAL | 1 refills | Status: DC

## 2022-10-06 MED ORDER — TIZANIDINE HCL 4 MG PO TABS: 4.0000 mg | ORAL_TABLET | Freq: Three times a day (TID) | ORAL | 1 refills | Status: DC | PRN

## 2022-10-06 MED ORDER — ISOSORBIDE MONONITRATE ER 30 MG PO TB24: 30.0000 mg | ORAL_TABLET | Freq: Every day | ORAL | 1 refills | Status: DC

## 2022-10-06 MED ORDER — ALBUTEROL SULFATE HFA 108 (90 BASE) MCG/ACT IN AERS: 2.0000 | INHALATION_SPRAY | Freq: Four times a day (QID) | RESPIRATORY_TRACT | 6 refills | Status: DC | PRN

## 2022-10-06 MED ORDER — EZETIMIBE 10 MG PO TABS: 10.0000 mg | ORAL_TABLET | Freq: Every day | ORAL | 1 refills | Status: DC

## 2022-10-06 MED ORDER — IBUPROFEN 800 MG PO TABS: 800.0000 mg | ORAL_TABLET | Freq: Three times a day (TID) | ORAL | 1 refills | Status: DC | PRN

## 2022-10-06 NOTE — Progress Notes (Unsigned)
BP 132/82   Pulse 61   Temp 98.5 F (36.9 C) (Oral)   Ht 5\' 5"  (1.651 m)   Wt 231 lb 14.4 oz (105.2 kg)   SpO2 98%   BMI 38.59 kg/m    Subjective:    Patient ID: Hannah Kaiser, female    DOB: Feb 07, 1961, 62 y.o.   MRN: [id number]  HPI: Hannah Kaiser is a 62 y.o. female  Chief Complaint  Patient presents with   Anxiety   Nausea   Leg Problem    Patient says she is still having issues with her leg spasms and says her insurance has been a messed up and she has not been able to start her Physical Therapy. Patient says her current prescription is helping her.    HYPERTENSION / HYPERLIPIDEMIA- didn't take her blood pressure medicine today.  Satisfied with current treatment? yes Duration of hypertension: chronic BP monitoring frequency: not checking BP medication side effects: no Past BP meds: atenolol, lasix, imdur, amlodipine, losartan Duration of hyperlipidemia: chronic Cholesterol medication side effects: no Cholesterol supplements: none Past cholesterol medications: crestor and zetia Medication compliance: good compliance Aspirin: no Recent stressors: yes Recurrent headaches: no Visual changes: no Palpitations: no Dyspnea: no Chest pain: no Lower extremity edema: no Dizzy/lightheaded: no  ANEMIA Anemia status: stable Etiology of anemia: iron def Duration of anemia treatment: months  Compliance with treatment: good compliance Iron supplementation side effects: no Severity of anemia: moderate Fatigue: yes Decreased exercise tolerance: yes  Dyspnea on exertion: yes Palpitations: yes Bleeding: no Pica: no  ANXIETY/STRESS Duration: chronic Status:exacerbated Anxious mood: yes  Excessive worrying: yes Irritability: yes  Sweating: no Nausea: no Palpitations:no Hyperventilation: no Panic attacks: no Agoraphobia: no  Obscessions/compulsions: no Depressed mood: yes    10/06/2022    1:20 PM 08/18/2022    9:26 AM 07/16/2022   11:04 AM 05/06/2022    8:34 AM  04/06/2022    1:05 PM  Depression screen PHQ 2/9  Decreased Interest 0 1 0 0 0  Down, Depressed, Hopeless 0 0 0 0 0  PHQ - 2 Score 0 1 0 0 0  Altered sleeping 1 2 2 3 2   Tired, decreased energy 1 2 2 3 2   Change in appetite 2 2 2 2 2   Feeling bad or failure about yourself  0 0 0 0 0  Trouble concentrating 2 0 1 1 0  Moving slowly or fidgety/restless 1 0 0 1 0  Suicidal thoughts 0 0 0 0 0  PHQ-9 Score 7 7 7 10 6   Difficult doing work/chores Somewhat difficult Somewhat difficult Not difficult at all Somewhat difficult Somewhat difficult      10/06/2022    1:20 PM 08/18/2022    9:26 AM 07/16/2022   11:04 AM 05/06/2022    8:34 AM  GAD 7 : Generalized Anxiety Score  Nervous, Anxious, on Edge 1 1 0 1  Control/stop worrying 0 1 0 0  Worry too much - different things 0 0 0 0  Trouble relaxing 1 1 1 1   Restless 1 1 1 1   Easily annoyed or irritable 1 1 2 1   Afraid - awful might happen 2 0 0 1  Total GAD 7 Score 6 5 4 5   Anxiety Difficulty  Somewhat difficult Somewhat difficult Somewhat difficult     Anhedonia: no Weight changes: no Insomnia: no   Hypersomnia: no Fatigue/loss of energy: yes Feelings of worthlessness: yes Feelings of guilt: no Impaired concentration/indecisiveness: no Suicidal ideations: no  Crying spells: no Recent Stressors/Life Changes: yes   Relationship problems: no   Family stress: yes     Financial stress: no    Job stress: no    Recent death/loss: yes  Relevant past medical, surgical, family and social history reviewed and updated as indicated. Interim medical history since our last visit reviewed. Allergies and medications reviewed and updated.  Review of Systems  Constitutional: Negative.   HENT: Negative.    Respiratory:  Positive for shortness of breath. Negative for cough, choking, chest tightness, wheezing and stridor.   Cardiovascular: Negative.   Gastrointestinal: Negative.   Musculoskeletal:  Positive for arthralgias, back pain and  myalgias. Negative for gait problem, joint swelling, neck pain and neck stiffness.  Skin: Negative.   Neurological: Negative.   Psychiatric/Behavioral:  Positive for dysphoric mood. Negative for agitation, behavioral problems, confusion, decreased concentration, hallucinations, self-injury, sleep disturbance and suicidal ideas. The patient is nervous/anxious. The patient is not hyperactive.     Per HPI unless specifically indicated above     Objective:    BP 132/82   Pulse 61   Temp 98.5 F (36.9 C) (Oral)   Ht 5\' 5"  (1.651 m)   Wt 231 lb 14.4 oz (105.2 kg)   SpO2 98%   BMI 38.59 kg/m   Wt Readings from Last 3 Encounters:  10/06/22 231 lb 14.4 oz (105.2 kg)  08/18/22 230 lb 6.4 oz (104.5 kg)  08/11/22 232 lb (105.2 kg)    Physical Exam Vitals and nursing note reviewed.  Constitutional:      General: She is not in acute distress.    Appearance: Normal appearance. She is normal weight. She is not ill-appearing, toxic-appearing or diaphoretic.  HENT:     Head: Normocephalic and atraumatic.     Right Ear: External ear normal.     Left Ear: External ear normal.     Nose: Nose normal.     Mouth/Throat:     Mouth: Mucous membranes are moist.     Pharynx: Oropharynx is clear.  Eyes:     General: No scleral icterus.       Right eye: No discharge.        Left eye: No discharge.     Extraocular Movements: Extraocular movements intact.     Conjunctiva/sclera: Conjunctivae normal.     Pupils: Pupils are equal, round, and reactive to light.  Cardiovascular:     Rate and Rhythm: Normal rate and regular rhythm.     Pulses: Normal pulses.     Heart sounds: Normal heart sounds. No murmur heard.    No friction rub. No gallop.  Pulmonary:     Effort: Pulmonary effort is normal. No respiratory distress.     Breath sounds: Normal breath sounds. No stridor. No wheezing, rhonchi or rales.  Chest:     Chest wall: No tenderness.  Musculoskeletal:        General: Normal range of motion.      Cervical back: Normal range of motion and neck supple.  Skin:    General: Skin is warm and dry.     Capillary Refill: Capillary refill takes less than 2 seconds.     Coloration: Skin is not jaundiced or pale.     Findings: No bruising, erythema, lesion or rash.  Neurological:     General: No focal deficit present.     Mental Status: She is alert and oriented to person, place, and time. Mental status is at baseline.  Psychiatric:  Mood and Affect: Mood normal.        Behavior: Behavior normal.        Thought Content: Thought content normal.        Judgment: Judgment normal.     Results for orders placed or performed in visit on 10/06/22  CBC with Differential/Platelet  Result Value Ref Range   WBC 6.6 3.4 - 10.8 x10E3/uL   RBC 4.52 3.77 - 5.28 x10E6/uL   Hemoglobin 13.1 11.1 - 15.9 g/dL   Hematocrit 16.1 09.6 - 46.6 %   MCV 88 79 - 97 fL   MCH 29.0 26.6 - 33.0 pg   MCHC 33.1 31.5 - 35.7 g/dL   RDW 04.5 40.9 - 81.1 %   Platelets 229 150 - 450 x10E3/uL   Neutrophils 57 Not Estab. %   Lymphs 34 Not Estab. %   Monocytes 6 Not Estab. %   Eos 2 Not Estab. %   Basos 1 Not Estab. %   Neutrophils Absolute 3.8 1.4 - 7.0 x10E3/uL   Lymphocytes Absolute 2.2 0.7 - 3.1 x10E3/uL   Monocytes Absolute 0.4 0.1 - 0.9 x10E3/uL   EOS (ABSOLUTE) 0.2 0.0 - 0.4 x10E3/uL   Basophils Absolute 0.0 0.0 - 0.2 x10E3/uL   Immature Granulocytes 0 Not Estab. %   Immature Grans (Abs) 0.0 0.0 - 0.1 x10E3/uL  Comprehensive metabolic panel  Result Value Ref Range   Glucose 87 70 - 99 mg/dL   BUN 13 6 - 24 mg/dL   Creatinine, Ser 9.14 0.57 - 1.00 mg/dL   eGFR 77 >78 GN/FAO/1.30   BUN/Creatinine Ratio 15 9 - 23   Sodium 142 134 - 144 mmol/L   Potassium 4.1 3.5 - 5.2 mmol/L   Chloride 105 96 - 106 mmol/L   CO2 21 20 - 29 mmol/L   Calcium 9.4 8.7 - 10.2 mg/dL   Total Protein 6.1 6.0 - 8.5 g/dL   Albumin 4.6 3.8 - 4.9 g/dL   Globulin, Total 1.5 1.5 - 4.5 g/dL   Albumin/Globulin Ratio 3.1 (H)  1.2 - 2.2   Bilirubin Total 0.5 0.0 - 1.2 mg/dL   Alkaline Phosphatase 67 44 - 121 IU/L   AST 17 0 - 40 IU/L   ALT 11 0 - 32 IU/L  Lipid Panel w/o Chol/HDL Ratio  Result Value Ref Range   Cholesterol, Total 163 100 - 199 mg/dL   Triglycerides 865 0 - 149 mg/dL   HDL 53 >78 mg/dL   VLDL Cholesterol Cal 22 5 - 40 mg/dL   LDL Chol Calc (NIH) 88 0 - 99 mg/dL      Assessment & Plan:   Problem List Items Addressed This Visit       Cardiovascular and Mediastinum   CAD (coronary artery disease)    Continue to follow with cardiology. Call with any concerns. Will keep BP and cholesterol under good control.  Continue to monitor.       Relevant Medications   amLODipine (NORVASC) 10 MG tablet   atenolol (TENORMIN) 50 MG tablet   ezetimibe (ZETIA) 10 MG tablet   isosorbide mononitrate (IMDUR) 30 MG 24 hr tablet   losartan (COZAAR) 100 MG tablet   rosuvastatin (CRESTOR) 5 MG tablet     Genitourinary   Benign hypertensive renal disease - Primary    Did not take her medicine this morning. Continue current regimen. Continue to monitor. Call with any concerns.        Relevant Orders   CBC with Differential/Platelet (Completed)  Comprehensive metabolic panel (Completed)     Other   Hyperlipidemia    Under good control on current regimen. Continue current regimen. Continue to monitor. Call with any concerns. Refills given. Labs drawn today.       Relevant Medications   amLODipine (NORVASC) 10 MG tablet   atenolol (TENORMIN) 50 MG tablet   ezetimibe (ZETIA) 10 MG tablet   isosorbide mononitrate (IMDUR) 30 MG 24 hr tablet   losartan (COZAAR) 100 MG tablet   rosuvastatin (CRESTOR) 5 MG tablet   Other Relevant Orders   CBC with Differential/Platelet (Completed)   Comprehensive metabolic panel (Completed)   Lipid Panel w/o Chol/HDL Ratio (Completed)   Severe episode of recurrent major depressive disorder, without psychotic features (HCC)    In exacerbation with the loss of her  brother-in-law. Will continue her medicine. Refill of her klonopin given today. Follow up 3 months. Call with any concerns.       Relevant Medications   busPIRone (BUSPAR) 15 MG tablet   FLUoxetine (PROZAC) 40 MG capsule   hydrOXYzine (ATARAX) 25 MG tablet   Other Relevant Orders   CBC with Differential/Platelet (Completed)   Comprehensive metabolic panel (Completed)   Anxiety    In exacerbation with the loss of her brother-in-law. Will continue her medicine. Refill of her klonopin given today. Follow up 3 months. Call with any concerns.       Relevant Medications   busPIRone (BUSPAR) 15 MG tablet   FLUoxetine (PROZAC) 40 MG capsule   hydrOXYzine (ATARAX) 25 MG tablet   Other Relevant Orders   CBC with Differential/Platelet (Completed)   Comprehensive metabolic panel (Completed)   Other Visit Diagnoses     Need for diphtheria-tetanus-pertussis (Tdap) vaccine       Tdap given today.   Relevant Orders   Tdap vaccine greater than or equal to 7yo IM        Follow up plan: No follow-ups on file.

## 2022-10-07 LAB — CBC WITH DIFFERENTIAL/PLATELET
Basophils Absolute: 0 10*3/uL (ref 0.0–0.2)
Basos: 1 %
EOS (ABSOLUTE): 0.2 10*3/uL (ref 0.0–0.4)
Eos: 2 %
Hematocrit: 39.6 % (ref 34.0–46.6)
Hemoglobin: 13.1 g/dL (ref 11.1–15.9)
Immature Grans (Abs): 0 10*3/uL (ref 0.0–0.1)
Immature Granulocytes: 0 %
Lymphocytes Absolute: 2.2 10*3/uL (ref 0.7–3.1)
Lymphs: 34 %
MCH: 29 pg (ref 26.6–33.0)
MCHC: 33.1 g/dL (ref 31.5–35.7)
MCV: 88 fL (ref 79–97)
Monocytes Absolute: 0.4 10*3/uL (ref 0.1–0.9)
Monocytes: 6 %
Neutrophils Absolute: 3.8 10*3/uL (ref 1.4–7.0)
Neutrophils: 57 %
Platelets: 229 10*3/uL (ref 150–450)
RBC: 4.52 x10E6/uL (ref 3.77–5.28)
RDW: 14.3 % (ref 11.7–15.4)
WBC: 6.6 10*3/uL (ref 3.4–10.8)

## 2022-10-07 LAB — COMPREHENSIVE METABOLIC PANEL
ALT: 11 IU/L (ref 0–32)
AST: 17 IU/L (ref 0–40)
Albumin/Globulin Ratio: 3.1 — ABNORMAL HIGH (ref 1.2–2.2)
Albumin: 4.6 g/dL (ref 3.8–4.9)
Alkaline Phosphatase: 67 IU/L (ref 44–121)
BUN/Creatinine Ratio: 15 (ref 9–23)
BUN: 13 mg/dL (ref 6–24)
Bilirubin Total: 0.5 mg/dL (ref 0.0–1.2)
CO2: 21 mmol/L (ref 20–29)
Calcium: 9.4 mg/dL (ref 8.7–10.2)
Chloride: 105 mmol/L (ref 96–106)
Creatinine, Ser: 0.87 mg/dL (ref 0.57–1.00)
Globulin, Total: 1.5 g/dL (ref 1.5–4.5)
Glucose: 87 mg/dL (ref 70–99)
Potassium: 4.1 mmol/L (ref 3.5–5.2)
Sodium: 142 mmol/L (ref 134–144)
Total Protein: 6.1 g/dL (ref 6.0–8.5)
eGFR: 77 mL/min/{1.73_m2} (ref >59)

## 2022-10-07 LAB — LIPID PANEL W/O CHOL/HDL RATIO
Cholesterol, Total: 163 mg/dL (ref 100–199)
HDL: 53 mg/dL (ref >39)
LDL Chol Calc (NIH): 88 mg/dL (ref 0–99)
Triglycerides: 126 mg/dL (ref 0–149)
VLDL Cholesterol Cal: 22 mg/dL (ref 5–40)

## 2022-10-07 NOTE — Assessment & Plan Note (Signed)
Continue to follow with cardiology. Call with any concerns. Will keep BP and cholesterol under good control. Continue to monitor.  

## 2022-10-08 ENCOUNTER — Encounter: Payer: Self-pay | Admitting: Family Medicine

## 2022-10-08 NOTE — Assessment & Plan Note (Addendum)
Did not take her medicine this morning. Continue current regimen. Continue to monitor. Call with any concerns.

## 2022-10-08 NOTE — Assessment & Plan Note (Signed)
Under good control on current regimen. Continue current regimen. Continue to monitor. Call with any concerns. Refills given. Labs drawn today.   

## 2022-10-08 NOTE — Assessment & Plan Note (Signed)
In exacerbation with the loss of her brother-in-law. Will continue her medicine. Refill of her klonopin given today. Follow up 3 months. Call with any concerns.  

## 2022-10-08 NOTE — Assessment & Plan Note (Signed)
In exacerbation with the loss of her brother-in-law. Will continue her medicine. Refill of her klonopin given today. Follow up 3 months. Call with any concerns.

## 2022-10-12 ENCOUNTER — Ambulatory Visit: Payer: Medicaid Other | Attending: Family Medicine

## 2022-10-19 ENCOUNTER — Ambulatory Visit: Payer: Medicaid Other

## 2022-10-27 DIAGNOSIS — M1612 Unilateral primary osteoarthritis, left hip: Secondary | ICD-10-CM | POA: Insufficient documentation

## 2022-10-29 ENCOUNTER — Encounter: Payer: Self-pay | Admitting: Oncology

## 2022-11-06 ENCOUNTER — Other Ambulatory Visit: Payer: Self-pay | Admitting: Family Medicine

## 2022-11-06 NOTE — Telephone Encounter (Signed)
Requested medication (s) are due for refill today: routing for review  Requested medication (s) are on the active medication list: yes  Last refill:  10/06/22  Future visit scheduled: no  Notes to clinic:  Unable to refill per protocol, cannot delegate. Request for 90 days, routing for approval.      Requested Prescriptions  Pending Prescriptions Disp Refills   tiZANidine (ZANAFLEX) 4 MG tablet [Pharmacy Med Name: TIZANIDINE HCL 4 MG TABLET] 270 tablet 1    Sig: Take 1 tablet (4 mg total) by mouth every 8 (eight) hours as needed for muscle spasms.     Not Delegated - Cardiovascular:  Alpha-2 Agonists - tizanidine Failed - 11/06/2022  8:33 AM      Failed - This refill cannot be delegated      Passed - Valid encounter within last 6 months    Recent Outpatient Visits           1 month ago Benign hypertensive renal disease   [facility] [place], Megan P, DO   2 months ago Other osteoarthritis of spine, lumbar region   [facility] [place], Megan P, DO   3 months ago Benign hypertensive renal disease   [facility] [place], Megan P, DO   6 months ago Numbness and tingling of lower extremity   [facility] [place], Oswaldo Conroy, PA-C   7 months ago Routine general medical examination at a health care facility   [facility] Dorcas Carrow, DO       Future Appointments             In 3 months Deirdre Evener, MD [facility]

## 2022-11-16 ENCOUNTER — Telehealth: Payer: Self-pay | Admitting: Family Medicine

## 2022-11-16 NOTE — Telephone Encounter (Signed)
Appointment has been scheduled.

## 2022-11-16 NOTE — Telephone Encounter (Signed)
Copied from CRM [id number]. Topic: Appointment Scheduling - Scheduling Inquiry for Clinic >> Nov 16, 2022  8:28 AM Franchot Heidelberg wrote: Reason for CRM: Pt wants to schedule a knee injection, a steroid injection she says.   Best contact:  [phone]  Pt wants to be seen tomorrow

## 2022-11-16 NOTE — Telephone Encounter (Signed)
Needs appt to talk about her knee and we can see if we can give her a shot, please don't schedule her specifically for a shot

## 2022-11-17 ENCOUNTER — Encounter: Payer: Self-pay | Admitting: Oncology

## 2022-11-19 ENCOUNTER — Ambulatory Visit (INDEPENDENT_AMBULATORY_CARE_PROVIDER_SITE_OTHER): Payer: Medicaid Other | Admitting: Family Medicine

## 2022-11-19 ENCOUNTER — Ambulatory Visit
Admission: RE | Admit: 2022-11-19 | Discharge: 2022-11-19 | Disposition: A | Discharge status: Home / Self Care | Payer: 59 | Source: Ambulatory Visit | Attending: Family Medicine | Admitting: Family Medicine

## 2022-11-19 ENCOUNTER — Encounter: Payer: Self-pay | Admitting: Family Medicine

## 2022-11-19 ENCOUNTER — Ambulatory Visit
Admission: RE | Admit: 2022-11-19 | Discharge: 2022-11-19 | Disposition: A | Discharge status: Home / Self Care | Payer: 59 | Attending: Family Medicine | Admitting: Family Medicine

## 2022-11-19 VITALS — BP 110/71 | HR 51 | Temp 98.5°F | Ht 65.0 in | Wt 235.2 lb

## 2022-11-19 DIAGNOSIS — M25562 Pain in left knee: Secondary | ICD-10-CM | POA: Diagnosis present

## 2022-11-19 DIAGNOSIS — N649 Disorder of breast, unspecified: Secondary | ICD-10-CM | POA: Diagnosis not present

## 2022-11-19 DIAGNOSIS — Z23 Encounter for immunization: Secondary | ICD-10-CM | POA: Diagnosis not present

## 2022-11-19 MED ORDER — KETOROLAC TROMETHAMINE 60 MG/2ML IM SOLN
60.0000 mg | Freq: Once | INTRAMUSCULAR | Status: AC
Start: 2022-11-19 — End: 2022-11-19
  Administered 2022-11-19: 60 mg via INTRAMUSCULAR

## 2022-11-19 NOTE — Patient Instructions (Addendum)
Your Mammogram is scheduled for: 12/09/22 2:40PM [facility]  Address: [address], [place], [place] [address] Phone: [phone]
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
## 2022-11-19 NOTE — Assessment & Plan Note (Signed)
Will check x-ray. Await results. May need steroid shot.

## 2022-11-23 ENCOUNTER — Encounter: Payer: Self-pay | Admitting: Oncology

## 2022-12-09 ENCOUNTER — Ambulatory Visit
Admission: RE | Admit: 2022-12-09 | Discharge: 2022-12-09 | Disposition: A | Discharge status: Home / Self Care | Payer: 59 | Source: Ambulatory Visit | Attending: Family Medicine

## 2022-12-09 ENCOUNTER — Ambulatory Visit
Admission: RE | Admit: 2022-12-09 | Discharge: 2022-12-09 | Disposition: A | Discharge status: Home / Self Care | Payer: 59 | Source: Ambulatory Visit | Attending: Family Medicine | Admitting: Family Medicine

## 2022-12-09 DIAGNOSIS — N649 Disorder of breast, unspecified: Secondary | ICD-10-CM | POA: Diagnosis present

## 2022-12-11 ENCOUNTER — Inpatient Hospital Stay: Payer: 59

## 2022-12-11 ENCOUNTER — Other Ambulatory Visit: Payer: Self-pay

## 2022-12-11 DIAGNOSIS — D509 Iron deficiency anemia, unspecified: Secondary | ICD-10-CM

## 2022-12-14 ENCOUNTER — Inpatient Hospital Stay: Payer: 59 | Attending: Oncology

## 2022-12-14 DIAGNOSIS — D509 Iron deficiency anemia, unspecified: Secondary | ICD-10-CM | POA: Insufficient documentation

## 2022-12-14 LAB — CBC WITH DIFFERENTIAL (CANCER CENTER ONLY)
Abs Immature Granulocytes: 0.08 10*3/uL — ABNORMAL HIGH (ref 0.00–0.07)
Basophils Absolute: 0 10*3/uL (ref 0.0–0.1)
Basophils Relative: 1 %
Eosinophils Absolute: 0.2 10*3/uL (ref 0.0–0.5)
Eosinophils Relative: 3 %
HCT: 38.6 % (ref 36.0–46.0)
Hemoglobin: 13 g/dL (ref 12.0–15.0)
Immature Granulocytes: 1 %
Lymphocytes Relative: 31 %
Lymphs Abs: 1.8 10*3/uL (ref 0.7–4.0)
MCH: 30 pg (ref 26.0–34.0)
MCHC: 33.7 g/dL (ref 30.0–36.0)
MCV: 88.9 fL (ref 80.0–100.0)
Monocytes Absolute: 0.5 10*3/uL (ref 0.1–1.0)
Monocytes Relative: 8 %
Neutro Abs: 3.1 10*3/uL (ref 1.7–7.7)
Neutrophils Relative %: 56 %
Platelet Count: 197 10*3/uL (ref 150–400)
RBC: 4.34 MIL/uL (ref 3.87–5.11)
RDW: 13.5 % (ref 11.5–15.5)
WBC Count: 5.6 10*3/uL (ref 4.0–10.5)
nRBC: 0 % (ref 0.0–0.2)

## 2022-12-14 LAB — IRON AND TIBC
Iron: 85 ug/dL (ref 28–170)
Saturation Ratios: 21 % (ref 10.4–31.8)
TIBC: 405 ug/dL (ref 250–450)
UIBC: 320 ug/dL

## 2022-12-14 LAB — FERRITIN: Ferritin: 10 ng/mL — ABNORMAL LOW (ref 11–307)

## 2022-12-15 ENCOUNTER — Encounter: Payer: Self-pay | Admitting: Oncology

## 2022-12-15 ENCOUNTER — Inpatient Hospital Stay (HOSPITAL_BASED_OUTPATIENT_CLINIC_OR_DEPARTMENT_OTHER): Payer: 59 | Admitting: Oncology

## 2022-12-15 ENCOUNTER — Inpatient Hospital Stay: Payer: 59

## 2022-12-15 VITALS — BP 129/75 | HR 56 | Temp 96.8°F | Resp 20 | Wt 233.2 lb

## 2022-12-15 DIAGNOSIS — D509 Iron deficiency anemia, unspecified: Secondary | ICD-10-CM | POA: Diagnosis not present

## 2022-12-15 MED ORDER — SODIUM CHLORIDE 0.9 % IV SOLN
Freq: Once | INTRAVENOUS | Status: AC
  Filled 2022-12-15: qty 250

## 2022-12-15 MED ORDER — SODIUM CHLORIDE 0.9 % IV SOLN
200.0000 mg | Freq: Once | INTRAVENOUS | Status: AC
  Administered 2022-12-15: 200 mg via INTRAVENOUS
  Filled 2022-12-15: qty 200
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
## 2022-12-15 NOTE — Patient Instructions (Signed)

## 2023-01-19 ENCOUNTER — Ambulatory Visit
Admission: RE | Admit: 2023-01-19 | Discharge: 2023-01-19 | Disposition: A | Discharge status: Home / Self Care | Payer: 59 | Source: Ambulatory Visit | Attending: Acute Care | Admitting: Acute Care

## 2023-01-19 ENCOUNTER — Ambulatory Visit: Payer: Medicaid Other

## 2023-01-19 DIAGNOSIS — Z87891 Personal history of nicotine dependence: Secondary | ICD-10-CM | POA: Diagnosis not present

## 2023-01-19 DIAGNOSIS — R911 Solitary pulmonary nodule: Secondary | ICD-10-CM | POA: Insufficient documentation

## 2023-01-26 ENCOUNTER — Other Ambulatory Visit: Payer: Self-pay | Admitting: Acute Care

## 2023-01-26 DIAGNOSIS — Z122 Encounter for screening for malignant neoplasm of respiratory organs: Secondary | ICD-10-CM

## 2023-01-26 DIAGNOSIS — Z87891 Personal history of nicotine dependence: Secondary | ICD-10-CM

## 2023-02-18 ENCOUNTER — Ambulatory Visit: Payer: Medicaid Other | Admitting: Dermatology

## 2023-02-20 ENCOUNTER — Other Ambulatory Visit: Payer: Self-pay | Admitting: Family Medicine

## 2023-02-22 NOTE — Telephone Encounter (Signed)
Requested Prescriptions  Pending Prescriptions Disp Refills   pantoprazole (PROTONIX) 40 MG tablet [Pharmacy Med Name: PANTOPRAZOLE SOD DR 40 MG TAB] 180 tablet 0    Sig: TAKE 1 TABLET BY MOUTH TWICE A DAY     Gastroenterology: Proton Pump Inhibitors Passed - 02/20/2023  8:39 AM      Passed - Valid encounter within last 12 months    Recent Outpatient Visits           3 months ago Acute pain of left knee   [place] [facility] [place], Megan P, DO   4 months ago Benign hypertensive renal disease   [place] [facility] [place], Megan P, DO   6 months ago Other osteoarthritis of spine, lumbar region   [facility] [place], Megan P, DO   7 months ago Benign hypertensive renal disease   [place] [facility] [place], Megan P, DO   9 months ago Numbness and tingling of lower extremity   [place] [facility] [place], Oswaldo Conroy, PA-C       Future Appointments             In 4 weeks Deirdre Evener, MD [facility]

## 2023-02-26 ENCOUNTER — Other Ambulatory Visit: Payer: Self-pay | Admitting: Family Medicine

## 2023-03-01 NOTE — Telephone Encounter (Signed)
Requested medications are due for refill today.  yes  Requested medications are on the active medications list.  yes  Last refill. 10/06/2022 #90 1 rf  Future visit scheduled.   no  Notes to clinic.  Refill not delegated.    Requested Prescriptions  Pending Prescriptions Disp Refills   tiZANidine (ZANAFLEX) 4 MG tablet [Pharmacy Med Name: TIZANIDINE HCL 4 MG TABLET] 270 tablet 1    Sig: Take 1 tablet (4 mg total) by mouth every 8 (eight) hours as needed for muscle spasms.     Not Delegated - Cardiovascular:  Alpha-2 Agonists - tizanidine Failed - 02/26/2023  8:32 AM      Failed - This refill cannot be delegated      Passed - Valid encounter within last 6 months    Recent Outpatient Visits           3 months ago Acute pain of left knee   [place] [facility] [place], Megan P, DO   4 months ago Benign hypertensive renal disease   [place] [facility] [place], Megan P, DO   6 months ago Other osteoarthritis of spine, lumbar region   [facility], Megan P, DO   7 months ago Benign hypertensive renal disease   [place] [facility] [place], Megan P, DO   9 months ago Numbness and tingling of lower extremity   [place] [facility] [place], Oswaldo Conroy, PA-C       Future Appointments             In 3 weeks Deirdre Evener, MD [facility]

## 2023-03-16 ENCOUNTER — Encounter: Payer: Self-pay | Admitting: Family Medicine

## 2023-03-16 ENCOUNTER — Ambulatory Visit (INDEPENDENT_AMBULATORY_CARE_PROVIDER_SITE_OTHER): Payer: 59 | Admitting: Family Medicine

## 2023-03-16 VITALS — BP 147/84 | HR 62 | Ht 65.0 in | Wt 239.4 lb

## 2023-03-16 DIAGNOSIS — E782 Mixed hyperlipidemia: Secondary | ICD-10-CM

## 2023-03-16 DIAGNOSIS — J441 Chronic obstructive pulmonary disease with (acute) exacerbation: Secondary | ICD-10-CM | POA: Diagnosis not present

## 2023-03-16 DIAGNOSIS — R1312 Dysphagia, oropharyngeal phase: Secondary | ICD-10-CM

## 2023-03-16 DIAGNOSIS — I2510 Atherosclerotic heart disease of native coronary artery without angina pectoris: Secondary | ICD-10-CM

## 2023-03-16 DIAGNOSIS — Z23 Encounter for immunization: Secondary | ICD-10-CM

## 2023-03-16 DIAGNOSIS — I270 Primary pulmonary hypertension: Secondary | ICD-10-CM

## 2023-03-16 DIAGNOSIS — I700 Atherosclerosis of aorta: Secondary | ICD-10-CM | POA: Diagnosis not present

## 2023-03-16 DIAGNOSIS — Z1231 Encounter for screening mammogram for malignant neoplasm of breast: Secondary | ICD-10-CM

## 2023-03-16 DIAGNOSIS — I129 Hypertensive chronic kidney disease with stage 1 through stage 4 chronic kidney disease, or unspecified chronic kidney disease: Secondary | ICD-10-CM

## 2023-03-16 MED ORDER — FUROSEMIDE 20 MG PO TABS: ORAL_TABLET | ORAL | 0 refills | Status: DC

## 2023-03-16 MED ORDER — QUETIAPINE FUMARATE 25 MG PO TABS: 25.0000 mg | ORAL_TABLET | Freq: Every day | ORAL | 0 refills | Status: DC

## 2023-03-16 MED ORDER — VALACYCLOVIR HCL 1 G PO TABS: 1000.0000 mg | ORAL_TABLET | Freq: Every day | ORAL | 0 refills | Status: DC

## 2023-03-16 MED ORDER — BUSPIRONE HCL 15 MG PO TABS: 15.0000 mg | ORAL_TABLET | Freq: Three times a day (TID) | ORAL | 0 refills | Status: DC

## 2023-03-16 MED ORDER — AMLODIPINE BESYLATE 10 MG PO TABS: 10.0000 mg | ORAL_TABLET | Freq: Every day | ORAL | 0 refills | Status: DC

## 2023-03-16 MED ORDER — ISOSORBIDE MONONITRATE ER 30 MG PO TB24: 30.0000 mg | ORAL_TABLET | Freq: Every day | ORAL | 0 refills | Status: DC

## 2023-03-16 MED ORDER — ASPIRIN 81 MG PO TBEC: 81.0000 mg | DELAYED_RELEASE_TABLET | Freq: Every day | ORAL | 1 refills | Status: DC

## 2023-03-16 MED ORDER — HYDROXYZINE HCL 25 MG PO TABS: 25.0000 mg | ORAL_TABLET | Freq: Three times a day (TID) | ORAL | 0 refills | Status: DC | PRN

## 2023-03-16 MED ORDER — ATENOLOL 50 MG PO TABS: 50.0000 mg | ORAL_TABLET | Freq: Every day | ORAL | 0 refills | Status: DC

## 2023-03-16 MED ORDER — CLONAZEPAM 1 MG PO TABS: 1.0000 mg | ORAL_TABLET | Freq: Two times a day (BID) | ORAL | 0 refills | Status: DC | PRN

## 2023-03-16 MED ORDER — EZETIMIBE 10 MG PO TABS: 10.0000 mg | ORAL_TABLET | Freq: Every day | ORAL | 0 refills | Status: DC

## 2023-03-16 MED ORDER — TIZANIDINE HCL 4 MG PO TABS: 4.0000 mg | ORAL_TABLET | Freq: Three times a day (TID) | ORAL | 0 refills | Status: DC | PRN

## 2023-03-16 MED ORDER — LOSARTAN POTASSIUM 100 MG PO TABS: 100.0000 mg | ORAL_TABLET | Freq: Every day | ORAL | 0 refills | Status: DC

## 2023-03-16 MED ORDER — ALBUTEROL SULFATE HFA 108 (90 BASE) MCG/ACT IN AERS: 2.0000 | INHALATION_SPRAY | Freq: Four times a day (QID) | RESPIRATORY_TRACT | 0 refills | Status: DC | PRN

## 2023-03-16 MED ORDER — ROSUVASTATIN CALCIUM 5 MG PO TABS: 5.0000 mg | ORAL_TABLET | ORAL | 0 refills | Status: DC

## 2023-03-16 MED ORDER — PANTOPRAZOLE SODIUM 40 MG PO TBEC: 40.0000 mg | DELAYED_RELEASE_TABLET | Freq: Two times a day (BID) | ORAL | 0 refills | Status: DC

## 2023-03-16 MED ORDER — FLUOXETINE HCL 40 MG PO CAPS: 80.0000 mg | ORAL_CAPSULE | Freq: Every day | ORAL | 0 refills | Status: DC

## 2023-03-16 NOTE — Progress Notes (Signed)
BP (!) 147/84   Pulse 62   Ht 5\' 5"  (1.651 m)   Wt 239 lb 6.4 oz (108.6 kg)   SpO2 98%   BMI 39.84 kg/m    Subjective:    Patient ID: Hannah Kaiser, female    DOB: 02-27-1961, 62 y.o.   MRN: [id number]  HPI: Hannah Kaiser is a 62 y.o. female  Chief Complaint  Patient presents with   Dysphagia    Patient says she has been experiencing numbness that started at her shoulders and radiating down into her arms and underneath her breast area. Patient says she has some discomfort in her lower neck area. Patient says the concerns comes and goes, but here lately she has noticed it more and it is starting to effecting her voice.    DYSPHAGIA Duration:  about a year Description of symptom: stuck in the back of her throat Onset: Immediately upon swallowing Location of dysphagia: throat Dysphagia to solids only: yes Dysphagia to solids & liquids: yes  Frequency: at least 1x a day  Progressively getting worse: yes Alleviatiating factors: nothing Provoking factors: pills Status: worse EGD: yes 2018 Weight loss: no Sensation of lump in throat: no Heartburn: yes Odynophagia: no Nausea: no Vomiting: no Drooling/nasal regurgitation/food spillage: no Coughing/choking/dysphonia: no Dysarthria: no Hematemesis: no Regurgitation of undigested food/halitosis: no Chest pain: no  HYPERTENSION / HYPERLIPIDEMIA Satisfied with current treatment? yes Duration of hypertension: chronic BP monitoring frequency: not checking BP medication side effects: no Past BP meds: amlodipine, atenolol, lasix, imdur, losartan,  Duration of hyperlipidemia: chronic Cholesterol medication side effects: no Cholesterol supplements: none Past cholesterol medications: crestor, zetia Medication compliance: excellent compliance Aspirin: yes Recent stressors: no Recurrent headaches: no Visual changes: no Palpitations: no Dyspnea: no Chest pain: no Lower extremity edema: no Dizzy/lightheaded: no  COPD COPD  status: controlled Satisfied with current treatment?: yes Oxygen use: no Limitation of activity: no Productive cough: no Pneumovax: Up to Date Influenza: Up to Date    Relevant past medical, surgical, family and social history reviewed and updated as indicated. Interim medical history since our last visit reviewed. Allergies and medications reviewed and updated.  Review of Systems  Constitutional: Negative.   Respiratory: Negative.    Cardiovascular: Negative.   Gastrointestinal: Negative.   Musculoskeletal: Negative.   Neurological:  Positive for numbness. Negative for dizziness, tremors, seizures, syncope, facial asymmetry, speech difficulty, weakness, light-headedness and headaches.  Psychiatric/Behavioral: Negative.      Per HPI unless specifically indicated above     Objective:    BP (!) 147/84   Pulse 62   Ht 5\' 5"  (1.651 m)   Wt 239 lb 6.4 oz (108.6 kg)   SpO2 98%   BMI 39.84 kg/m   Wt Readings from Last 3 Encounters:  03/16/23 239 lb 6.4 oz (108.6 kg)  12/15/22 233 lb 3.2 oz (105.8 kg)  11/19/22 235 lb 3.2 oz (106.7 kg)    Physical Exam Vitals and nursing note reviewed.  Constitutional:      General: She is not in acute distress.    Appearance: Normal appearance. She is obese. She is not ill-appearing, toxic-appearing or diaphoretic.  HENT:     Head: Normocephalic and atraumatic.     Right Ear: External ear normal.     Left Ear: External ear normal.     Nose: Nose normal.     Mouth/Throat:     Mouth: Mucous membranes are moist.     Pharynx: Oropharynx is clear.  Eyes:  General: No scleral icterus.       Right eye: No discharge.        Left eye: No discharge.     Extraocular Movements: Extraocular movements intact.     Conjunctiva/sclera: Conjunctivae normal.     Pupils: Pupils are equal, round, and reactive to light.  Cardiovascular:     Rate and Rhythm: Normal rate and regular rhythm.     Pulses: Normal pulses.     Heart sounds: Normal heart  sounds. No murmur heard.    No friction rub. No gallop.  Pulmonary:     Effort: Pulmonary effort is normal. No respiratory distress.     Breath sounds: Normal breath sounds. No stridor. No wheezing, rhonchi or rales.  Chest:     Chest wall: No tenderness.  Musculoskeletal:        General: Normal range of motion.     Cervical back: Normal range of motion and neck supple.  Skin:    General: Skin is warm and dry.     Capillary Refill: Capillary refill takes less than 2 seconds.     Coloration: Skin is not jaundiced or pale.     Findings: No bruising, erythema, lesion or rash.  Neurological:     General: No focal deficit present.     Mental Status: She is alert and oriented to person, place, and time. Mental status is at baseline.  Psychiatric:        Mood and Affect: Mood normal.        Behavior: Behavior normal.        Thought Content: Thought content normal.        Judgment: Judgment normal.     Results for orders placed or performed in visit on 12/14/22  CBC with Differential (Cancer Center Only)  Result Value Ref Range   WBC Count 5.6 4.0 - 10.5 K/uL   RBC 4.34 3.87 - 5.11 MIL/uL   Hemoglobin 13.0 12.0 - 15.0 g/dL   HCT 16.1 09.6 - 04.5 %   MCV 88.9 80.0 - 100.0 fL   MCH 30.0 26.0 - 34.0 pg   MCHC 33.7 30.0 - 36.0 g/dL   RDW 40.9 81.1 - 91.4 %   Platelet Count 197 150 - 400 K/uL   nRBC 0.0 0.0 - 0.2 %   Neutrophils Relative % 56 %   Neutro Abs 3.1 1.7 - 7.7 K/uL   Lymphocytes Relative 31 %   Lymphs Abs 1.8 0.7 - 4.0 K/uL   Monocytes Relative 8 %   Monocytes Absolute 0.5 0.1 - 1.0 K/uL   Eosinophils Relative 3 %   Eosinophils Absolute 0.2 0.0 - 0.5 K/uL   Basophils Relative 1 %   Basophils Absolute 0.0 0.0 - 0.1 K/uL   Immature Granulocytes 1 %   Abs Immature Granulocytes 0.08 (H) 0.00 - 0.07 K/uL  Ferritin  Result Value Ref Range   Ferritin 10 (L) 11 - 307 ng/mL  Iron and TIBC([facility])  Result Value Ref Range   Iron 85 28 - 170 ug/dL   TIBC 782 956 -  213 ug/dL   Saturation Ratios 21 10.4 - 31.8 %   UIBC 320 ug/dL      Assessment & Plan:   Problem List Items Addressed This Visit       Cardiovascular and Mediastinum   CAD (coronary artery disease)    Found incidentally on imaging. Will refer to cardiology. Will keep BP and cholesterol under good control. Continue to monitor. Call with any  concerns.       Relevant Medications   aspirin EC 81 MG tablet   amLODipine (NORVASC) 10 MG tablet   atenolol (TENORMIN) 50 MG tablet   ezetimibe (ZETIA) 10 MG tablet   furosemide (LASIX) 20 MG tablet   isosorbide mononitrate (IMDUR) 30 MG 24 hr tablet   losartan (COZAAR) 100 MG tablet   rosuvastatin (CRESTOR) 5 MG tablet   Other Relevant Orders   Ambulatory referral to Cardiology   Aortic atherosclerosis (HCC)    Will keep BP and cholesterol under good control. Continue to monitor. Call with any concerns.       Relevant Medications   aspirin EC 81 MG tablet   amLODipine (NORVASC) 10 MG tablet   atenolol (TENORMIN) 50 MG tablet   ezetimibe (ZETIA) 10 MG tablet   furosemide (LASIX) 20 MG tablet   isosorbide mononitrate (IMDUR) 30 MG 24 hr tablet   losartan (COZAAR) 100 MG tablet   rosuvastatin (CRESTOR) 5 MG tablet   Other Relevant Orders   Ambulatory referral to Cardiology   Pulmonary hypertension, primary (HCC)    Found incidentally on imaging. Continue to follow with cardiology. Call with any concerns.       Relevant Medications   aspirin EC 81 MG tablet   amLODipine (NORVASC) 10 MG tablet   atenolol (TENORMIN) 50 MG tablet   ezetimibe (ZETIA) 10 MG tablet   furosemide (LASIX) 20 MG tablet   isosorbide mononitrate (IMDUR) 30 MG 24 hr tablet   losartan (COZAAR) 100 MG tablet   rosuvastatin (CRESTOR) 5 MG tablet     Respiratory   Chronic obstructive pulmonary disease with acute exacerbation (HCC)    Under good control on current regimen. Continue current regimen. Continue to monitor. Call with any concerns. Refills given.         Relevant Medications   albuterol (VENTOLIN HFA) 108 (90 Base) MCG/ACT inhaler     Genitourinary   Benign hypertensive renal disease    Under good control on current regimen. Continue current regimen. Continue to monitor. Call with any concerns. Refills given. Labs drawn today.          Other   Hyperlipidemia    Rechecking labs today. Await results. Treat as needed.       Relevant Medications   aspirin EC 81 MG tablet   amLODipine (NORVASC) 10 MG tablet   atenolol (TENORMIN) 50 MG tablet   ezetimibe (ZETIA) 10 MG tablet   furosemide (LASIX) 20 MG tablet   isosorbide mononitrate (IMDUR) 30 MG 24 hr tablet   losartan (COZAAR) 100 MG tablet   rosuvastatin (CRESTOR) 5 MG tablet   Morbid obesity (HCC)    Encouraged diet and exercise with goal of losing 1-2 lbs per week. Call with any concerns.       Other Visit Diagnoses     Oropharyngeal dysphagia    -  Primary   Will refer to GI. Call with any concerns. Continue to monitor.   Relevant Orders   Ambulatory referral to Speech Therapy   Ambulatory referral to Gastroenterology   Needs flu shot       Flu shot given today. Call with any concerns.   Relevant Orders   Flu vaccine trivalent PF, 6mos and older(Flulaval,Afluria,Fluarix,Fluzone) (Completed)   Encounter for screening mammogram for malignant neoplasm of breast       Mammo ordered today.   Relevant Orders   MM 3D SCREENING MAMMOGRAM BILATERAL BREAST        Follow up  plan: Return for after 10/30 for physcial .

## 2023-03-20 ENCOUNTER — Encounter: Payer: Self-pay | Admitting: Family Medicine

## 2023-03-20 DIAGNOSIS — J449 Chronic obstructive pulmonary disease, unspecified: Secondary | ICD-10-CM | POA: Insufficient documentation

## 2023-03-20 DIAGNOSIS — I270 Primary pulmonary hypertension: Secondary | ICD-10-CM | POA: Insufficient documentation

## 2023-03-20 DIAGNOSIS — J441 Chronic obstructive pulmonary disease with (acute) exacerbation: Secondary | ICD-10-CM | POA: Insufficient documentation

## 2023-03-20 NOTE — Assessment & Plan Note (Signed)
Found incidentally on imaging. Continue to follow with cardiology. Call with any concerns.

## 2023-03-20 NOTE — Assessment & Plan Note (Signed)
Rechecking labs today. Await results. Treat as needed.  °

## 2023-03-20 NOTE — Assessment & Plan Note (Signed)
Will keep BP and cholesterol under good control. Continue to monitor. Call with any concerns.  

## 2023-03-20 NOTE — Assessment & Plan Note (Signed)
Encouraged diet and exercise with goal of losing 1-2lbs per week. Call with any concerns.

## 2023-03-20 NOTE — Assessment & Plan Note (Signed)
Under good control on current regimen. Continue current regimen. Continue to monitor. Call with any concerns. Refills given.   

## 2023-03-20 NOTE — Assessment & Plan Note (Addendum)
Found incidentally on imaging. Will refer to cardiology. Will keep BP and cholesterol under good control. Continue to monitor. Call with any concerns.

## 2023-03-20 NOTE — Assessment & Plan Note (Signed)
Under good control on current regimen. Continue current regimen. Continue to monitor. Call with any concerns. Refills given. Labs drawn today.   

## 2023-03-21 NOTE — Progress Notes (Unsigned)
Cardiology Office Note  Date:  03/23/2023   ID:  Hannah Kaiser, DOB [birth year], MRN [id number]  PCP:  Dorcas Carrow, DO   Cc: shortness of breath  HPI:  [name] is a 62 year old woman with past medical history of Essential hypertension OSA not on CPAP Morbid obesity Severe three-vessel coronary calcification, cardiac catheterization July 2018 nonobstructive disease Hyperlipidemia History of smoking/COPD, 1 ppd Enlarged pulm monic trunk concerning for pulmonary artery hypertension Who presents by referral from Dr. Olevia Perches for aortic atherosclerosis, coronary artery disease  Prior records requested and reviewed Cardiac catheterization July 2018 Mid RCA lesion, 40 %stenosed. Prox Cx to Mid Cx lesion, 40 %stenosed. Non-Obstructive CAD with severe calcification in proximal to mid LAD with mild disease  CT scan chest August 2024 Enlarged pulmonary artery concerning for pulmonary hypertension Images pulled up and reviewed showing dilated pulmonary artery, severe three-vessel coronary calcification  Lab work reviewed Total cholesterol 163 LDL 88 On Crestor 5 daily  Takes lasix 2x a week Like leg swelling right greater than left  Chronic SOB, chest tightness on heavy exertion Can he needs to smoke 1 pack/day, trying to cut back Cannot wait Chantix  EKG personally reviewed by myself on todays visit EKG Interpretation Date/Time:  Tuesday March 23 2023 10:35:52 EDT Ventricular Rate:  50 PR Interval:  150 QRS Duration:  82 QT Interval:  460 QTC Calculation: 419 R Axis:   -3  Text Interpretation: Sinus bradycardia Low voltage QRS When compared with ECG of 14-Jan-2017 10:57, Criteria for Septal infarct are no longer Present Confirmed by Julien Nordmann [phone]) on 03/23/2023 10:53:38 AM   Family hx: Mother and father with CAD  PMH:   has a past medical history of Anemia, Anxiety, Arthritis, Asthma, Breast pain, CHF (congestive heart failure) (HCC), Colon polyps,  COPD (chronic obstructive pulmonary disease) (HCC), Depression, Dysrhythmia, GERD (gastroesophageal reflux disease), Glaucoma, Heart murmur, History of blood transfusion, Hypertension, Motion sickness, Multilevel degenerative disc disease, Neuropathy, Nipple discharge, Numbness of both lower extremities, Numbness of upper extremity, Pneumonia, PONV (postoperative nausea and vomiting), Sleep apnea, Smokers' cough (HCC), Swelling of limb (03/11/2021), and Wears dentures.  PSH:    Past Surgical History:  Procedure Laterality Date   ABDOMINAL HYSTERECTOMY     BACK SURGERY     BELPHAROPTOSIS REPAIR     BROW LIFT Bilateral 03/24/2016   Procedure: BLEPHAROPLASTY;  Surgeon: Imagene Riches, MD;  Location: [facility];  Service: Ophthalmology;  Laterality: Bilateral;  sleep apnea   CARDIAC CATHETERIZATION     COLONOSCOPY WITH PROPOFOL N/A 10/19/2016   Procedure: COLONOSCOPY WITH PROPOFOL;  Surgeon: Midge Minium, MD;  Location: [facility];  Service: Endoscopy;  Laterality: N/A;   COLONOSCOPY WITH PROPOFOL N/A 05/12/2022   Procedure: COLONOSCOPY WITH PROPOFOL;  Surgeon: Midge Minium, MD;  Location: [facility] END[facility];  Service: Endoscopy;  Laterality: N/A;   DILATION AND CURETTAGE OF UTERUS     ECTOPIC PREGNANCY SURGERY     x2   ESOPHAGOGASTRODUODENOSCOPY (EGD) WITH PROPOFOL N/A 10/19/2016   Procedure: ESOPHAGOGASTRODUODENOSCOPY (EGD) WITH PROPOFOL;  Surgeon: Midge Minium, MD;  Location: [facility];  Service: Endoscopy;  Laterality: N/A;   LEFT HEART CATH AND CORONARY ANGIOGRAPHY Left 12/29/2016   Procedure: Left Heart Cath and Coronary Angiography;  Surgeon: Laurier Nancy, MD;  [facility];  Service: Cardiovascular;  Laterality: Left;   NECK SURGERY  2003   POLYPECTOMY  10/19/2016   Procedure: POLYPECTOMY;  Surgeon: Midge Minium, MD;  Location: [facility];  Service: Endoscopy;;   SPINE SURGERY  1989, 1998   TOTAL ABDOMINAL HYSTERECTOMY W/ BILATERAL  SALPINGOOPHORECTOMY  2014   TUBAL LIGATION      Current Outpatient Medications  Medication Sig Dispense Refill   albuterol (VENTOLIN HFA) 108 (90 Base) MCG/ACT inhaler Inhale 2 puffs into the lungs every 6 (six) hours as needed for wheezing or shortness of breath. 8 g 0   amLODipine (NORVASC) 10 MG tablet Take 1 tablet (10 mg total) by mouth daily. 90 tablet 0   aspirin EC 81 MG tablet Take 1 tablet (81 mg total) by mouth daily. 90 tablet 1   atenolol (TENORMIN) 50 MG tablet Take 1 tablet (50 mg total) by mouth daily. 90 tablet 0   busPIRone (BUSPAR) 15 MG tablet Take 1 tablet (15 mg total) by mouth 3 (three) times daily. 270 tablet 0   clonazePAM (KLONOPIN) 1 MG tablet Take 1 tablet (1 mg total) by mouth 2 (two) times daily as needed. for anxiety 30 tablet 0   ezetimibe (ZETIA) 10 MG tablet Take 1 tablet (10 mg total) by mouth daily. 90 tablet 0   FLUoxetine (PROZAC) 40 MG capsule Take 2 capsules (80 mg total) by mouth daily. 180 capsule 0   furosemide (LASIX) 20 MG tablet TAKE 1 TABLET BY MOUTH ONCE DAILY AS NEEDED FOR SWELLING 60 tablet 0   hydrOXYzine (ATARAX) 25 MG tablet Take 1 tablet (25 mg total) by mouth 3 (three) times daily as needed. 270 tablet 0   ibuprofen (ADVIL) 800 MG tablet Take 1 tablet (800 mg total) by mouth every 8 (eight) hours as needed. 270 tablet 1   isosorbide mononitrate (IMDUR) 30 MG 24 hr tablet Take 1 tablet (30 mg total) by mouth daily. 90 tablet 0   latanoprost (XALATAN) 0.005 % ophthalmic solution Place 1 drop into both eyes at bedtime.     losartan (COZAAR) 100 MG tablet Take 1 tablet (100 mg total) by mouth daily. 90 tablet 0   magic mouthwash (lidocaine, diphenhydrAMINE, alum & mag hydroxide) suspension Swish and spit 5 mLs 3 (three) times daily as needed for mouth pain. 360 mL 1   mupirocin ointment (BACTROBAN) 2 % Apply 1 application. topically 2 (two) times daily. 22 g 0   nystatin-triamcinolone ointment (MYCOLOG) Apply 1 application topically 2 (two)  times daily. 30 g 1   ondansetron (ZOFRAN) 4 MG tablet Take 1 tablet (4 mg total) by mouth every 8 (eight) hours as needed for nausea or vomiting. 60 tablet 3   pantoprazole (PROTONIX) 40 MG tablet Take 1 tablet (40 mg total) by mouth 2 (two) times daily. 180 tablet 0   pregabalin (LYRICA) 25 MG capsule Take 25 mg by mouth 2 (two) times daily.     QUEtiapine (SEROQUEL) 25 MG tablet Take 1-2 tablets (25-50 mg total) by mouth at bedtime. 180 tablet 0   tiZANidine (ZANAFLEX) 4 MG tablet Take 1 tablet (4 mg total) by mouth every 8 (eight) hours as needed for muscle spasms. 90 tablet 0   triamcinolone ointment (KENALOG) 0.5 % Apply 1 application topically 2 (two) times daily. 30 g 0   valACYclovir (VALTREX) 1000 MG tablet Take 1 tablet (1,000 mg total) by mouth daily. 180 tablet 0   Vitamin D, Ergocalciferol, (DRISDOL) 1.25 MG (50000 UNIT) CAPS capsule Take 50,000 Units by mouth once a week.     rosuvastatin (CRESTOR) 10 MG tablet Take 1 tablet (10 mg total) by mouth every other day. 90 tablet 3  No current facility-administered medications for this visit.     Allergies:   Abilify [aripiprazole], Ace inhibitors, Wellbutrin [bupropion], Paxlovid [nirmatrelvir-ritonavir], Azithromycin, and Erythromycin   Social History:  The patient  reports that she has been smoking cigarettes. She has a 61.5 pack-year smoking history. She has never used smokeless tobacco. She reports that she does not drink alcohol and does not use drugs.   Family History:   family history includes Cancer in her brother, maternal grandfather, and maternal uncle.    Review of Systems: Review of Systems  Constitutional: Negative.   HENT: Negative.    Respiratory:  Positive for shortness of breath.   Cardiovascular:  Positive for leg swelling.  Gastrointestinal: Negative.   Musculoskeletal: Negative.   Neurological: Negative.   Psychiatric/Behavioral: Negative.    All other systems reviewed and are negative.    PHYSICAL  EXAM: VS:  BP 130/68   Pulse (!) 50   Ht 5\' 4"  (1.626 m)   Wt 238 lb (108 kg)   SpO2 97%   BMI 40.85 kg/m  , BMI Body mass index is 40.85 kg/m. GEN: Well nourished, well developed, in no acute distress HEENT: normal Neck: no JVD, carotid bruits, or masses Cardiac: RRR; no murmurs, rubs, or gallops,no edema  Respiratory:  clear to auscultation bilaterally, normal work of breathing GI: soft, nontender, nondistended, + BS MS: no deformity or atrophy Skin: warm and dry, no rash Neuro:  Strength and sensation are intact Psych: euthymic mood, full affect   Recent Labs: 03/25/2022: TSH 0.741 10/06/2022: ALT 11; BUN 13; Creatinine, Ser 0.87; Potassium 4.1; Sodium 142 12/14/2022: Hemoglobin 13.0; Platelet Count 197    Lipid Panel Lab Results  Component Value Date   CHOL 163 10/06/2022   HDL 53 10/06/2022   LDLCALC 88 10/06/2022   TRIG 126 10/06/2022      Wt Readings from Last 3 Encounters:  03/23/23 238 lb (108 kg)  03/16/23 239 lb 6.4 oz (108.6 kg)  12/15/22 233 lb 3.2 oz (105.8 kg)       ASSESSMENT AND PLAN:  Problem List Items Addressed This Visit       Cardiology Problems   Hyperlipidemia   Relevant Medications   rosuvastatin (CRESTOR) 10 MG tablet   Other Relevant Orders   EKG 12-Lead (Completed)   CAD (coronary artery disease) - Primary   Relevant Medications   rosuvastatin (CRESTOR) 10 MG tablet   Other Relevant Orders   EKG 12-Lead (Completed)   Aortic atherosclerosis (HCC)   Relevant Medications   rosuvastatin (CRESTOR) 10 MG tablet   Other Relevant Orders   EKG 12-Lead (Completed)   Pulmonary hypertension, primary (HCC)   Relevant Medications   rosuvastatin (CRESTOR) 10 MG tablet   Other Relevant Orders   EKG 12-Lead (Completed)   ECHOCARDIOGRAM COMPLETE   NM Myocar Multi W/Spect W/Wall Motion / EF     Other   Benign hypertensive renal disease   Relevant Orders   EKG 12-Lead (Completed)   Chronic obstructive pulmonary disease with acute  exacerbation (HCC)   Relevant Orders   EKG 12-Lead (Completed)   Morbid obesity (HCC)   Relevant Orders   EKG 12-Lead (Completed)   Other Visit Diagnoses     Precordial pain       Relevant Orders   NM Myocar Multi W/Spect W/Wall Motion / EF      Shortness of breath/chest is/angina Continues to smoke 1 pack/day, known COPD Morbid obesity, deconditioning Unable to exclude underlying ischemia Will recommend noninvasive study/Myoview  to rule out ischemia Not a good candidate for cardiac CTA given severity of coronary calcification Low threshold for repeat cardiac catheterization Smoking cessation recommended Echo pending to evaluate for pulmonary hypertension  Hyperlipidemia Recommend she increase Crestor up to 10, continue Zetia, goal LDL less than 70  PAD/aortic atherosclerosis Moderate disease noted on CT scan Aggressive lipid management, smoking cessation recommended  Smoking We have encouraged her to continue to work on weaning her cigarettes and smoking cessation. She will continue to work on this and does not want any assistance with chantix.    Essential hypertension Blood pressure is well controlled on today's visit. No changes made to the medications.  Pulmonary hypertension Dilated pulmonary artery on CT scan Echocardiogram ordered to evaluate right heart pressures   Signed, Dossie Arbour, M.D., Ph.D. [facility] [place], [place] [phone]

## 2023-03-22 ENCOUNTER — Encounter: Payer: Self-pay | Admitting: Oncology

## 2023-03-22 ENCOUNTER — Ambulatory Visit: Payer: 59 | Admitting: Dermatology

## 2023-03-22 DIAGNOSIS — L820 Inflamed seborrheic keratosis: Secondary | ICD-10-CM | POA: Diagnosis not present

## 2023-03-22 DIAGNOSIS — L814 Other melanin hyperpigmentation: Secondary | ICD-10-CM | POA: Diagnosis not present

## 2023-03-22 DIAGNOSIS — Z1283 Encounter for screening for malignant neoplasm of skin: Secondary | ICD-10-CM | POA: Diagnosis not present

## 2023-03-22 DIAGNOSIS — L821 Other seborrheic keratosis: Secondary | ICD-10-CM

## 2023-03-22 DIAGNOSIS — W908XXA Exposure to other nonionizing radiation, initial encounter: Secondary | ICD-10-CM

## 2023-03-22 DIAGNOSIS — D229 Melanocytic nevi, unspecified: Secondary | ICD-10-CM

## 2023-03-22 DIAGNOSIS — L578 Other skin changes due to chronic exposure to nonionizing radiation: Secondary | ICD-10-CM

## 2023-03-22 NOTE — Patient Instructions (Addendum)

## 2023-03-22 NOTE — Progress Notes (Signed)
   New Patient Visit   Subjective  Hannah Kaiser is a 62 y.o. female who presents for the following: Skin Cancer Screening and Full Body Skin Exam  The patient presents for Total-Body Skin Exam (TBSE) for skin cancer screening and mole check. The patient has spots, moles and lesions to be evaluated, some may be new or changing and the patient may have concern these could be cancer.    The following portions of the chart were reviewed this encounter and updated as appropriate: medications, allergies, medical history  Review of Systems:  No other skin or systemic complaints except as noted in HPI or Assessment and Plan.  Objective  Well appearing patient in no apparent distress; mood and affect are within normal limits.  A full examination was performed including scalp, head, eyes, ears, nose, lips, neck, chest, axillae, abdomen, back, buttocks, bilateral upper extremities, bilateral lower extremities, hands, feet, fingers, toes, fingernails, and toenails. All findings within normal limits unless otherwise noted below.   Relevant physical exam findings are noted in the Assessment and Plan.  right top of shoulder x 1, left scalp x 1 (2) Stuck-on, waxy, tan-brown papule -- Discussed benign etiology and prognosis.     Assessment & Plan   SKIN CANCER SCREENING PERFORMED TODAY.  ACTINIC DAMAGE - Chronic condition, secondary to cumulative UV/sun exposure - diffuse scaly erythematous macules with underlying dyspigmentation - Recommend daily broad spectrum sunscreen SPF 30+ to sun-exposed areas, reapply every 2 hours as needed.  - Staying in the shade or wearing long sleeves, sun glasses (UVA+UVB protection) and wide brim hats (4-inch brim around the entire circumference of the hat) are also recommended for sun protection.  - Call for new or changing lesions.  LENTIGINES, SEBORRHEIC KERATOSES, - Benign normal skin lesions - Benign-appearing - Call for any changes  MELANOCYTIC NEVI -  Tan-brown and/or pink-flesh-colored symmetric macules and papules - Benign appearing on exam today - Observation - Call clinic for new or changing moles - Recommend daily use of broad spectrum spf 30+ sunscreen to sun-exposed areas.   Inflamed seborrheic keratosis (2) right top of shoulder x 1, left scalp x 1  Symptomatic, irritating, patient would like treated.   Destruction of lesion - right top of shoulder x 1, left scalp x 1 (2) Complexity: simple   Destruction method: cryotherapy   Informed consent: discussed and consent obtained   Timeout:  patient name, date of birth, surgical site, and procedure verified Lesion destroyed using liquid nitrogen: Yes   Region frozen until ice ball extended beyond lesion: Yes   Outcome: patient tolerated procedure well with no complications   Post-procedure details: wound care instructions given     Puffiness under eyes could be related to allergies recommend otc antihistamines daily If no better may consider consult with your optometrist      Return if symptoms worsen or fail to improve.  IAngelique Holm, CMA, am acting as scribe for Armida Sans, MD .   Documentation: I have reviewed the above documentation for accuracy and completeness, and I agree with the above.  Armida Sans, MD

## 2023-03-23 ENCOUNTER — Ambulatory Visit: Payer: 59 | Attending: Family Medicine | Admitting: Speech Pathology

## 2023-03-23 ENCOUNTER — Other Ambulatory Visit: Payer: Self-pay | Admitting: Family Medicine

## 2023-03-23 ENCOUNTER — Ambulatory Visit: Payer: Medicaid Other | Attending: Cardiovascular Disease | Admitting: Cardiovascular Disease

## 2023-03-23 ENCOUNTER — Encounter: Payer: Self-pay | Admitting: Cardiovascular Disease

## 2023-03-23 VITALS — BP 130/68 | HR 50 | Ht 64.0 in | Wt 238.0 lb

## 2023-03-23 DIAGNOSIS — J441 Chronic obstructive pulmonary disease with (acute) exacerbation: Secondary | ICD-10-CM | POA: Diagnosis not present

## 2023-03-23 DIAGNOSIS — I700 Atherosclerosis of aorta: Secondary | ICD-10-CM | POA: Diagnosis not present

## 2023-03-23 DIAGNOSIS — R072 Precordial pain: Secondary | ICD-10-CM

## 2023-03-23 DIAGNOSIS — R1312 Dysphagia, oropharyngeal phase: Secondary | ICD-10-CM

## 2023-03-23 DIAGNOSIS — R490 Dysphonia: Secondary | ICD-10-CM

## 2023-03-23 DIAGNOSIS — I270 Primary pulmonary hypertension: Secondary | ICD-10-CM | POA: Diagnosis not present

## 2023-03-23 DIAGNOSIS — I25118 Atherosclerotic heart disease of native coronary artery with other forms of angina pectoris: Secondary | ICD-10-CM | POA: Diagnosis not present

## 2023-03-23 DIAGNOSIS — E782 Mixed hyperlipidemia: Secondary | ICD-10-CM

## 2023-03-23 DIAGNOSIS — I129 Hypertensive chronic kidney disease with stage 1 through stage 4 chronic kidney disease, or unspecified chronic kidney disease: Secondary | ICD-10-CM

## 2023-03-23 DIAGNOSIS — J381 Polyp of vocal cord and larynx: Secondary | ICD-10-CM

## 2023-03-23 MED ORDER — ROSUVASTATIN CALCIUM 10 MG PO TABS: 10.0000 mg | ORAL_TABLET | ORAL | 3 refills | Status: DC

## 2023-03-23 NOTE — Therapy (Unsigned)
OUTPATIENT SPEECH LANGUAGE PATHOLOGY  SWALLOW EVALUATION   Patient Name: Hannah Kaiser MRN: [id number] DOB:07/20/1960, 62 y.o., female Today's Date: 03/23/2023  PCP: Marland Kitchen REFERRING PROVIDER: ***   End of Session - 03/23/23 1626     Visit Number 1    Number of Visits 1    Authorization Type [place] [employer] [place] [employer]    SLP Start Time 1310    SLP Stop Time  1355    SLP Time Calculation (min) 45 min    Activity Tolerance Patient tolerated treatment well             Past Medical History:  Diagnosis Date   Anemia    Anxiety    Arthritis    knees, thoracic spurs and arthritis   Asthma    Breast pain    CHF (congestive heart failure) (HCC)    Colon polyps    COPD (chronic obstructive pulmonary disease) (HCC)    Depression    Dysrhythmia    GERD (gastroesophageal reflux disease)    Glaucoma    Heart murmur    History of blood transfusion    Hypertension    Motion sickness    cars   Multilevel degenerative disc disease    Neuropathy    Bilateral arms and legs   Nipple discharge    Numbness of both lower extremities    bulging lumbar discs - per pt   Numbness of upper extremity    bilateral - degenerative cervical discs - per pt   Pneumonia    PONV (postoperative nausea and vomiting)    Sleep apnea    has CPAP   Smokers' cough (HCC)    Swelling of limb 03/11/2021   Wears dentures    full upper and lower   Past Surgical History:  Procedure Laterality Date   ABDOMINAL HYSTERECTOMY     BACK SURGERY     BELPHAROPTOSIS REPAIR     BROW LIFT Bilateral 03/24/2016   Procedure: BLEPHAROPLASTY;  Surgeon: Imagene Riches, MD;  Location: [facility];  Service: Ophthalmology;  Laterality: Bilateral;  sleep apnea   CARDIAC CATHETERIZATION     COLONOSCOPY WITH PROPOFOL N/A 10/19/2016   Procedure: COLONOSCOPY WITH PROPOFOL;  Surgeon: Midge Minium, MD;  Location: [facility];  Service: Endoscopy;  Laterality: N/A;   COLONOSCOPY WITH PROPOFOL N/A  05/12/2022   Procedure: COLONOSCOPY WITH PROPOFOL;  Surgeon: Midge Minium, MD;  Location: [facility] ENDOSCOPY;  Service: Endoscopy;  Laterality: N/A;   DILATION AND CURETTAGE OF UTERUS     ECTOPIC PREGNANCY SURGERY     x2   ESOPHAGOGASTRODUODENOSCOPY (EGD) WITH PROPOFOL N/A 10/19/2016   Procedure: ESOPHAGOGASTRODUODENOSCOPY (EGD) WITH PROPOFOL;  Surgeon: Midge Minium, MD;  Location: [facility];  Service: Endoscopy;  Laterality: N/A;   LEFT HEART CATH AND CORONARY ANGIOGRAPHY Left 12/29/2016   Procedure: Left Heart Cath and Coronary Angiography;  Surgeon: Laurier Nancy, MD;  Location: [facility] INVASIVE CV LAB;  Service: Cardiovascular;  Laterality: Left;   NECK SURGERY  2003   POLYPECTOMY  10/19/2016   Procedure: POLYPECTOMY;  Surgeon: Midge Minium, MD;  Location: [facility];  Service: Endoscopy;;   SPINE SURGERY  1989, 1998   TOTAL ABDOMINAL HYSTERECTOMY W/ BILATERAL SALPINGOOPHORECTOMY  2014   TUBAL LIGATION     Patient Active Problem List   Diagnosis Date Noted   Pulmonary hypertension, primary (HCC) 03/20/2023   Chronic obstructive pulmonary disease with acute exacerbation (HCC) 03/20/2023   Morbid obesity (HCC) 03/20/2023  Aortic atherosclerosis (HCC) 03/16/2023   Iron deficiency anemia 05/11/2022   Parotid gland enlargement 08/19/2021   Acute pain of left knee 04/09/2021   Nicotine dependence, cigarettes, uncomplicated 03/11/2021   Spinal osteoarthritis 09/26/2020   OSA (obstructive sleep apnea) 08/29/2020   Anxiety 10/31/2019   CAD (coronary artery disease) 06/15/2019   Severe episode of recurrent major depressive disorder, without psychotic features (HCC) 12/29/2017   Caregiver stress 12/03/2017   Degenerative spondylolisthesis 01/19/2017   History of colonic polyps    Polyp of ascending colon    Problems with swallowing and mastication    Gastritis without bleeding    Hyperlipidemia 09/28/2016   Bladder prolapse, female, acquired 02/24/2016   Arthritis of  both knees 01/14/2016   Herpes simplex infection 12/03/2015   Controlled substance agreement signed 12/03/2015   GERD (gastroesophageal reflux disease) 07/29/2015   Primary osteoarthritis of both knees 04/12/2015   Benign hypertensive renal disease 03/13/2015    ONSET DATE: ***   REFERRING DIAG: ***  THERAPY DIAG:  Dysphagia, oropharyngeal phase  Rationale for Evaluation and Treatment {HABREHAB:27488}  SUBJECTIVE:   SUBJECTIVE STATEMENT: *** Pt accompanied by: {accompnied:27141}  PERTINENT HISTORY: ***  DIAGNOSTIC FINDINGS: ***  PAIN:  Are you having pain? {OPRCPAIN:27236}  FALLS: Has patient fallen in last 6 months?  {XBJYNWGN:56213}  LIVING ENVIRONMENT: Lives with: {OPRC lives with:25569::"lives with their family"} Lives in: {Lives in:25570}  PLOF:  Level of assistance: {YQMVHQI:69629} Employment: {SLPemployment:25674}   PATIENT GOALS  ***  OBJECTIVE:  RECOMMENDATIONS FROM OBJECTIVE SWALLOW STUDY (MBSS/FEES):  *** Objective swallow impairments: *** Objective recommended compensations: ***  COGNITION: Overall cognitive status: {cognition:24006} Areas of impairment:  {ARMCcognitiveimpairment:28858} Functional deficits: ***  ORAL MOTOR EXAMINATION Facial : {OM Face:25663} Lingual: {OM Lingual:25664} Velum: {OM Velum:25665} Mandible: {OM mandible:25667} Cough: {OM Cough:25660} Voice: {OM Voice:25662}   CLINICAL SWALLOW ASSESSMENT:   Current diet: {slpdiet:27196} Dentition: {slpdentition:27818} Feeding: {slp feeding:27200} Consistencies tested: {slpconsistencies:27815}   Evaluation findings: {SLPswallowresults:27796}  Aspiration risk factors:{SLPaspirationfactors:27797} Overall aspiration risk:{slpaspirationrisk:27798} Diet Recommendations: {slpdiet:27196} Precautions:{slpprecautions:27799} Supervision: {slpsupervision:27802} Oral care recommendations:{slporalcarerec:27800} Follow-up recommendations: {slpfollowup:27801}     PATIENT REPORTED  OUTCOME MEASURES (PROM): {SLPPROM:27785}  TODAY'S TREATMENT:  ***   PATIENT EDUCATION: Education details: *** Person educated: {Person educated:25204} Education method: {Education Method:25205} Education comprehension: {Education Comprehension:25206}  HOME EXERCISE PROGRAM:     ***   GOALS: Goals reviewed with patient? {yes/no:20286}  SHORT TERM GOALS: Target date: 10 sessions  *** Baseline: Goal status: {GOALSTATUS:25110}  2.  *** Baseline:  Goal status: {GOALSTATUS:25110}  3.  *** Baseline:  Goal status: {GOALSTATUS:25110}  4.  *** Baseline:  Goal status: {GOALSTATUS:25110}  5.  *** Baseline:  Goal status: {GOALSTATUS:25110}  6.  *** Baseline:  Goal status: {GOALSTATUS:25110}  LONG TERM GOALS: Target date:   *** Baseline:  Goal status: {GOALSTATUS:25110}  2.  *** Baseline:  Goal status: {GOALSTATUS:25110}  3.  *** Baseline:  Goal status: {GOALSTATUS:25110}  4.  *** Baseline:  Goal status: {GOALSTATUS:25110}  5.  *** Baseline:  Goal status: {GOALSTATUS:25110}  6.  *** Baseline:  Goal status: {GOALSTATUS:25110}  ASSESSMENT:  CLINICAL IMPRESSION: Patient is a 62 y.o. female who was seen today for a clinical swallow evaluation.   OBJECTIVE IMPAIRMENTS include {SLPOBJIMP:27107}. These impairments are limiting patient from {SLPLIMIT:27108}. Factors affecting potential to achieve goals and functional outcome are {SLP factors:25450}. Patient will benefit from skilled SLP services to address above impairments and improve overall function.  REHAB POTENTIAL: {rehabpotential:25112}  PLAN: SLP FREQUENCY: {rehab frequency:25116}  SLP DURATION: {rehab duration:25117}  PLANNED INTERVENTIONS: {SLP  treatment/interventions:25449}    Lealer Marsland B. Dreama Saa, M.S., CCC-SLP, Tree surgeon Certified Brain Injury Specialist [facility]  [place] [facility] Rehabilitation Services Office [phone] Ascom  [phone] Fax [phone]

## 2023-03-23 NOTE — Patient Instructions (Addendum)
Medication Instructions:  Please increase the crestor up to 10 mg daily  If you need a refill on your cardiac medications before your next appointment, please call your pharmacy.   Lab work: No new labs needed  Testing/Procedures: Your physician has requested that you have an echocardiogram. Echocardiography is a painless test that uses sound waves to create images of your heart. It provides your doctor with information about the size and shape of your heart and how well your heart's chambers and valves are working.   You may receive an ultrasound enhancing agent through an IV if needed to better visualize your heart during the echo. This procedure takes approximately one hour.  There are no restrictions for this procedure.  This will take place at [address], [place] [address]   Your provider has ordered a Lexiscan/ Exercise Myoview Stress test. This will take place at [facility]. Please report to the [facility] medical mall entrance. The volunteers at the first desk will direct you where to go.  [facility] MYOVIEW  Your provider has ordered a Stress Test with nuclear imaging. The purpose of this test is to evaluate the blood supply to your heart muscle. This procedure is referred to as a "Non-Invasive Stress Test." This is because other than having an IV started in your vein, nothing is inserted or "invades" your body. Cardiac stress tests are done to find areas of poor blood flow to the heart by determining the extent of coronary artery disease (CAD). Some patients exercise on a treadmill, which naturally increases the blood flow to your heart, while others who are unable to walk on a treadmill due to physical limitations will have a pharmacologic/chemical stress agent called Lexiscan . This medicine will mimic walking on a treadmill by temporarily increasing your coronary blood flow.   Please note: these test may take anywhere between 2-4 hours to complete  How to  prepare for your Myoview test:  Nothing to eat for 6 hours prior to the test No caffeine for 24 hours prior to test No smoking 24 hours prior to test. Your medication may be taken with water.  If your doctor stopped a medication because of this test, do not take that medication. Ladies, please do not wear dresses.  Skirts or pants are appropriate. Please wear a short sleeve shirt. No perfume, cologne or lotion. Wear comfortable walking shoes. No heels!   PLEASE NOTIFY THE OFFICE AT LEAST 24 HOURS IN ADVANCE IF YOU ARE UNABLE TO KEEP YOUR APPOINTMENT.  [phone] AND  PLEASE NOTIFY NUCLEAR MEDICINE AT [facility] AT LEAST 24 HOURS IN ADVANCE IF YOU ARE UNABLE TO KEEP YOUR APPOINTMENT. [phone]   Follow-Up: At [facility], you and your health needs are our priority.  As part of our continuing mission to provide you with exceptional heart care, we have created designated Provider Care Teams.  These Care Teams include your primary Cardiologist (physician) and Advanced Practice Providers (APPs -  Physician Assistants and Nurse Practitioners) who all work together to provide you with the care you need, when you need it.  You will need a follow up appointment in 12 months  Providers on your designated Care Team:   Nicolasa Ducking, NP Eula Listen, PA-C Cadence Fransico Michael, [place]  COVID-19 Vaccine Information can be found at: [email or web address] For questions related to vaccine distribution or appointments, please email [email or web address] or call [phone].

## 2023-03-26 ENCOUNTER — Encounter: Payer: Self-pay | Admitting: Dermatology

## 2023-03-29 ENCOUNTER — Encounter: Payer: Self-pay | Admitting: Oncology

## 2023-03-30 ENCOUNTER — Encounter: Payer: 59 | Admitting: Speech Pathology

## 2023-03-30 ENCOUNTER — Ambulatory Visit: Payer: 59

## 2023-03-31 ENCOUNTER — Other Ambulatory Visit: Payer: Self-pay | Admitting: Cardiovascular Disease

## 2023-03-31 DIAGNOSIS — E782 Mixed hyperlipidemia: Secondary | ICD-10-CM

## 2023-03-31 DIAGNOSIS — I129 Hypertensive chronic kidney disease with stage 1 through stage 4 chronic kidney disease, or unspecified chronic kidney disease: Secondary | ICD-10-CM

## 2023-03-31 DIAGNOSIS — R072 Precordial pain: Secondary | ICD-10-CM

## 2023-03-31 DIAGNOSIS — I25118 Atherosclerotic heart disease of native coronary artery with other forms of angina pectoris: Secondary | ICD-10-CM

## 2023-03-31 DIAGNOSIS — J441 Chronic obstructive pulmonary disease with (acute) exacerbation: Secondary | ICD-10-CM

## 2023-03-31 DIAGNOSIS — I700 Atherosclerosis of aorta: Secondary | ICD-10-CM

## 2023-03-31 DIAGNOSIS — I270 Primary pulmonary hypertension: Secondary | ICD-10-CM

## 2023-04-05 ENCOUNTER — Encounter
Admission: RE | Admit: 2023-04-05 | Discharge: 2023-04-05 | Disposition: A | Discharge status: Home / Self Care | Payer: 59 | Source: Ambulatory Visit | Attending: Cardiovascular Disease | Admitting: Cardiovascular Disease

## 2023-04-05 DIAGNOSIS — I270 Primary pulmonary hypertension: Secondary | ICD-10-CM | POA: Insufficient documentation

## 2023-04-05 DIAGNOSIS — R072 Precordial pain: Secondary | ICD-10-CM | POA: Insufficient documentation

## 2023-04-06 ENCOUNTER — Encounter: Payer: 59 | Admitting: Speech Pathology

## 2023-04-08 ENCOUNTER — Encounter: Payer: 59 | Admitting: Speech Pathology

## 2023-04-11 ENCOUNTER — Other Ambulatory Visit: Payer: Self-pay | Admitting: Family Medicine

## 2023-04-12 NOTE — Telephone Encounter (Signed)
Requested Prescriptions  Pending Prescriptions Disp Refills   albuterol (VENTOLIN HFA) 108 (90 Base) MCG/ACT inhaler [Pharmacy Med Name: ALBUTEROL HFA (VENTOLIN) INH] 18 each 0    Sig: TAKE 2 PUFFS BY MOUTH EVERY 6 HOURS AS NEEDED FOR WHEEZE OR SHORTNESS OF BREATH     Pulmonology:  Beta Agonists 2 Passed - 04/11/2023  8:42 AM      Passed - Last BP in normal range    BP Readings from Last 1 Encounters:  03/23/23 130/68         Passed - Last Heart Rate in normal range    Pulse Readings from Last 1 Encounters:  03/23/23 (!) 50         Passed - Valid encounter within last 12 months    Recent Outpatient Visits           3 weeks ago Oropharyngeal dysphagia   [place] [facility] [place], Megan P, DO   4 months ago Acute pain of left knee   [place] [facility] [place], Megan P, DO   6 months ago Benign hypertensive renal disease   [place] [facility] [place], Megan P, DO   7 months ago Other osteoarthritis of spine, lumbar region   [facility] [place], Megan P, DO   9 months ago Benign hypertensive renal disease   [place] [facility] Dorcas Carrow, DO       Future Appointments             In 1 month Johnson, Oralia Rud, DO [place] [facility]

## 2023-04-13 ENCOUNTER — Ambulatory Visit: Payer: 59 | Admitting: Speech Pathology

## 2023-04-13 ENCOUNTER — Ambulatory Visit: Payer: 59 | Attending: Cardiovascular Disease

## 2023-04-13 DIAGNOSIS — I270 Primary pulmonary hypertension: Secondary | ICD-10-CM

## 2023-04-13 DIAGNOSIS — I25118 Atherosclerotic heart disease of native coronary artery with other forms of angina pectoris: Secondary | ICD-10-CM

## 2023-04-13 LAB — ECHOCARDIOGRAM COMPLETE
Area-P 1/2: 3.6 cm2
S' Lateral: 4.1 cm

## 2023-04-15 ENCOUNTER — Encounter: Payer: 59 | Admitting: Speech Pathology

## 2023-04-16 ENCOUNTER — Other Ambulatory Visit: Payer: Self-pay | Admitting: *Deleted

## 2023-04-16 DIAGNOSIS — D509 Iron deficiency anemia, unspecified: Secondary | ICD-10-CM

## 2023-04-19 ENCOUNTER — Encounter: Payer: Self-pay | Admitting: Emergency Medicine

## 2023-04-19 ENCOUNTER — Inpatient Hospital Stay: Payer: 59 | Attending: Oncology

## 2023-04-19 ENCOUNTER — Inpatient Hospital Stay: Payer: 59

## 2023-04-19 ENCOUNTER — Encounter (HOSPITAL_BASED_OUTPATIENT_CLINIC_OR_DEPARTMENT_OTHER)
Admission: RE | Admit: 2023-04-19 | Discharge: 2023-04-19 | Disposition: A | Discharge status: Home / Self Care | Payer: 59 | Source: Ambulatory Visit | Attending: Cardiovascular Disease | Admitting: Cardiovascular Disease

## 2023-04-19 DIAGNOSIS — Z79899 Other long term (current) drug therapy: Secondary | ICD-10-CM | POA: Diagnosis not present

## 2023-04-19 DIAGNOSIS — R072 Precordial pain: Secondary | ICD-10-CM

## 2023-04-19 DIAGNOSIS — Z862 Personal history of diseases of the blood and blood-forming organs and certain disorders involving the immune mechanism: Secondary | ICD-10-CM | POA: Insufficient documentation

## 2023-04-19 DIAGNOSIS — D509 Iron deficiency anemia, unspecified: Secondary | ICD-10-CM

## 2023-04-19 DIAGNOSIS — I270 Primary pulmonary hypertension: Secondary | ICD-10-CM | POA: Diagnosis present

## 2023-04-19 LAB — CBC WITH DIFFERENTIAL/PLATELET
Abs Immature Granulocytes: 0.03 10*3/uL (ref 0.00–0.07)
Basophils Absolute: 0 10*3/uL (ref 0.0–0.1)
Basophils Relative: 0 %
Eosinophils Absolute: 0.2 10*3/uL (ref 0.0–0.5)
Eosinophils Relative: 3 %
HCT: 37 % (ref 36.0–46.0)
Hemoglobin: 12.3 g/dL (ref 12.0–15.0)
Immature Granulocytes: 0 %
Lymphocytes Relative: 27 %
Lymphs Abs: 1.8 10*3/uL (ref 0.7–4.0)
MCH: 29.6 pg (ref 26.0–34.0)
MCHC: 33.2 g/dL (ref 30.0–36.0)
MCV: 89.2 fL (ref 80.0–100.0)
Monocytes Absolute: 0.4 10*3/uL (ref 0.1–1.0)
Monocytes Relative: 6 %
Neutro Abs: 4.4 10*3/uL (ref 1.7–7.7)
Neutrophils Relative %: 64 %
Platelets: 183 10*3/uL (ref 150–400)
RBC: 4.15 MIL/uL (ref 3.87–5.11)
RDW: 12.4 % (ref 11.5–15.5)
WBC: 6.8 10*3/uL (ref 4.0–10.5)
nRBC: 0 % (ref 0.0–0.2)

## 2023-04-19 LAB — FERRITIN: Ferritin: 15 ng/mL (ref 11–307)

## 2023-04-19 LAB — NM MYOCAR MULTI W/SPECT W/WALL MOTION / EF
LV dias vol: 96 mL (ref 46–106)
LV sys vol: 39 mL
Nuc Stress EF: 59 %
Peak HR: 62 {beats}/min
Percent HR: 38 %
Rest HR: 43 {beats}/min
Rest Nuclear Isotope Dose: 10.2 mCi
SDS: 2
SRS: 11
SSS: 4
ST Depression (mm): 0 mm
Stress Nuclear Isotope Dose: 28.9 mCi
TID: 1.06

## 2023-04-19 LAB — IRON AND TIBC
Iron: 96 ug/dL (ref 28–170)
Saturation Ratios: 25 % (ref 10.4–31.8)
TIBC: 384 ug/dL (ref 250–450)
UIBC: 288 ug/dL

## 2023-04-19 MED ORDER — TECHNETIUM TC 99M TETROFOSMIN IV KIT
10.0000 | PACK | Freq: Once | INTRAVENOUS | Status: AC
  Administered 2023-04-19: 10.15 via INTRAVENOUS

## 2023-04-19 MED ORDER — REGADENOSON 0.4 MG/5ML IV SOLN
0.4000 mg | Freq: Once | INTRAVENOUS | Status: AC
  Administered 2023-04-19: 0.4 mg via INTRAVENOUS

## 2023-04-19 MED ORDER — TECHNETIUM TC 99M TETROFOSMIN IV KIT
30.0000 | PACK | Freq: Once | INTRAVENOUS | Status: AC
  Administered 2023-04-19: 28.92 via INTRAVENOUS

## 2023-04-20 ENCOUNTER — Encounter: Payer: 59 | Admitting: Speech Pathology

## 2023-04-20 ENCOUNTER — Inpatient Hospital Stay: Payer: 59

## 2023-04-20 ENCOUNTER — Encounter: Payer: Self-pay | Admitting: Oncology

## 2023-04-20 ENCOUNTER — Inpatient Hospital Stay (HOSPITAL_BASED_OUTPATIENT_CLINIC_OR_DEPARTMENT_OTHER): Payer: 59 | Admitting: Oncology

## 2023-04-20 VITALS — BP 145/75 | HR 50 | Temp 97.7°F | Resp 18 | Ht 64.0 in | Wt 234.0 lb

## 2023-04-20 DIAGNOSIS — D509 Iron deficiency anemia, unspecified: Secondary | ICD-10-CM

## 2023-04-20 DIAGNOSIS — Z862 Personal history of diseases of the blood and blood-forming organs and certain disorders involving the immune mechanism: Secondary | ICD-10-CM | POA: Diagnosis not present

## 2023-04-20 NOTE — Progress Notes (Signed)
[facility]  [phone] [phone]  ID: Karinda Vigne Manago OB: Feb 06, 1961  MR#: [id number]  VHQ#:[id number]  Patient Care Team: Dorcas Carrow, DO as PCP - General (Family Medicine) Lonell Face, MD (Neurology) Laurier Nancy, MD as Consulting Physician (Cardiology) Jeralyn Ruths, MD as Consulting Physician (Oncology)  CHIEF COMPLAINT: Iron deficiency anemia.  INTERVAL HISTORY: Patient returns to clinic today for repeat laboratory work, further evaluation, and continuation of IV Venofer. 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  She otherwise feels well.  She denies any recent fevers or illnesses.  She has a good appetite and denies weight loss.  She has no chest pain, shortness of breath, cough, or hemoptysis.  She denies any nausea, vomiting, constipation, or diarrhea.  She has no melena or hematochezia.  She has no urinary complaints.  Patient offers no further specific complaints today.  REVIEW OF SYSTEMS:   Review of Systems  Constitutional:  Positive for malaise/fatigue. Negative for fever and weight loss.  Respiratory: Negative.  Negative for cough, hemoptysis and shortness of breath.   Cardiovascular: Negative.  Negative for chest pain and leg swelling.  Gastrointestinal: Negative.  Negative for abdominal pain, blood in stool and melena.  Genitourinary: Negative.  Negative for hematuria.  Musculoskeletal: Negative.  Negative for back pain.  Skin: Negative.  Negative for rash.  Neurological:  Negative for dizziness, focal weakness, weakness and headaches.  Psychiatric/Behavioral: Negative.  The patient is not nervous/anxious.     As per HPI. Otherwise, a complete review of systems is negative.  PAST MEDICAL HISTORY: Past Medical History:  Diagnosis Date   Anemia    Anxiety    Arthritis    knees, thoracic spurs and arthritis   Asthma    Breast pain     CHF (congestive heart failure) (HCC)    Colon polyps    COPD (chronic obstructive pulmonary disease) (HCC)    Depression    Dysrhythmia    GERD (gastroesophageal reflux disease)    Glaucoma    Heart murmur    History of blood transfusion    Hypertension    Motion sickness    cars   Multilevel degenerative disc disease    Neuropathy    Bilateral arms and legs   Nipple discharge    Numbness of both lower extremities    bulging lumbar discs - per pt   Numbness of upper extremity    bilateral - degenerative cervical discs - per pt   Pneumonia    PONV (postoperative nausea and vomiting)    Sleep apnea    has CPAP   Smokers' cough (HCC)    Swelling of limb 03/11/2021   Wears dentures    full upper and lower    PAST SURGICAL HISTORY: Past Surgical History:  Procedure Laterality Date   ABDOMINAL HYSTERECTOMY     BACK SURGERY     BELPHAROPTOSIS REPAIR     BROW LIFT Bilateral 03/24/2016   Procedure: BLEPHAROPLASTY;  Surgeon: Imagene Riches, MD;  Location: [facility];  Service: Ophthalmology;  Laterality: Bilateral;  sleep apnea   CARDIAC CATHETERIZATION     COLONOSCOPY WITH PROPOFOL N/A 10/19/2016   Procedure: COLONOSCOPY WITH PROPOFOL;  Surgeon: Midge Minium, MD;  Location: [facility];  Service: Endoscopy;  Laterality: N/A;   COLONOSCOPY WITH PROPOFOL N/A 05/12/2022   Procedure: COLONOSCOPY WITH PROPOFOL;  Surgeon: Midge Minium, MD;  Location:  [facility];  Service: Endoscopy;  Laterality: N/A;   DILATION AND CURETTAGE OF UTERUS     ECTOPIC PREGNANCY SURGERY     x2   ESOPHAGOGASTRODUODENOSCOPY (EGD) WITH PROPOFOL N/A 10/19/2016   Procedure: ESOPHAGOGASTRODUODENOSCOPY (EGD) WITH PROPOFOL;  Surgeon: Midge Minium, MD;  Location: [facility];  Service: Endoscopy;  Laterality: N/A;   LEFT HEART CATH AND CORONARY ANGIOGRAPHY Left 12/29/2016   Procedure: Left Heart Cath and Coronary Angiography;  Surgeon: Laurier Nancy, MD;  Location: [facility] INV[facility] CV [facility];   Service: Cardiovascular;  Laterality: Left;   NECK SURGERY  2003   POLYPECTOMY  10/19/2016   Procedure: POLYPECTOMY;  Surgeon: Midge Minium, MD;  Location: [facility];  Service: Endoscopy;;   SPINE SURGERY  1989, 1998   TOTAL ABDOMINAL HYSTERECTOMY W/ BILATERAL SALPINGOOPHORECTOMY  2014   TUBAL LIGATION      FAMILY HISTORY: Family History  Problem Relation Age of Onset   Cancer Maternal Uncle        lung   Cancer Maternal Grandfather        prostate   Cancer Brother        melanoma   Breast cancer Neg Hx     ADVANCED DIRECTIVES (Y/N):  N  HEALTH MAINTENANCE: Social History   Tobacco Use   Smoking status: Every Day    Current packs/day: 1.50    Average packs/day: 1.5 packs/day for 41.0 years (61.5 ttl pk-yrs)    Types: Cigarettes   Smokeless tobacco: Never   Tobacco comments:    since age 18  Vaping Use   Vaping status: Never Used  Substance Use Topics   Alcohol use: No    Alcohol/week: 0.0 standard drinks of alcohol   Drug use: No     Colonoscopy:  PAP:  Bone density:  Lipid panel:  Allergies  Allergen Reactions   Abilify [Aripiprazole] Swelling    Whole  body swelling, retains fluid   Ace Inhibitors Cough    So bad she vomited Other reaction(s): Cough So bad she vomited Other reaction(s): Cough So bad she vomited    Wellbutrin [Bupropion] Other (See Comments)    Worsened depression   Paxlovid [Nirmatrelvir-Ritonavir] Other (See Comments)    Ulcers in mouth   Azithromycin Rash   Erythromycin Rash    Current Outpatient Medications  Medication Sig Dispense Refill   albuterol (VENTOLIN HFA) 108 (90 Base) MCG/ACT inhaler TAKE 2 PUFFS BY MOUTH EVERY 6 HOURS AS NEEDED FOR WHEEZE OR SHORTNESS OF BREATH 18 each 1   amLODipine (NORVASC) 10 MG tablet Take 1 tablet (10 mg total) by mouth daily. 90 tablet 0   aspirin EC 81 MG tablet Take 1 tablet (81 mg total) by mouth daily. 90 tablet 1   atenolol (TENORMIN) 50 MG tablet Take 1 tablet (50 mg  total) by mouth daily. 90 tablet 0   busPIRone (BUSPAR) 15 MG tablet Take 1 tablet (15 mg total) by mouth 3 (three) times daily. 270 tablet 0   clonazePAM (KLONOPIN) 1 MG tablet Take 1 tablet (1 mg total) by mouth 2 (two) times daily as needed. for anxiety 30 tablet 0   ezetimibe (ZETIA) 10 MG tablet Take 1 tablet (10 mg total) by mouth daily. 90 tablet 0   FLUoxetine (PROZAC) 40 MG capsule Take 2 capsules (80 mg total) by mouth daily. 180 capsule 0   furosemide (LASIX) 20 MG tablet TAKE 1 TABLET BY MOUTH ONCE DAILY AS NEEDED FOR SWELLING 60 tablet 0   hydrOXYzine (ATARAX)  25 MG tablet Take 1 tablet (25 mg total) by mouth 3 (three) times daily as needed. 270 tablet 0   ibuprofen (ADVIL) 800 MG tablet Take 1 tablet (800 mg total) by mouth every 8 (eight) hours as needed. 270 tablet 1   isosorbide mononitrate (IMDUR) 30 MG 24 hr tablet Take 1 tablet (30 mg total) by mouth daily. 90 tablet 0   latanoprost (XALATAN) 0.005 % ophthalmic solution Place 1 drop into both eyes at bedtime.     losartan (COZAAR) 100 MG tablet Take 1 tablet (100 mg total) by mouth daily. 90 tablet 0   magic mouthwash (lidocaine, diphenhydrAMINE, alum & mag hydroxide) suspension Swish and spit 5 mLs 3 (three) times daily as needed for mouth pain. 360 mL 1   mupirocin ointment (BACTROBAN) 2 % Apply 1 application. topically 2 (two) times daily. 22 g 0   nystatin-triamcinolone ointment (MYCOLOG) Apply 1 application topically 2 (two) times daily. 30 g 1   ondansetron (ZOFRAN) 4 MG tablet Take 1 tablet (4 mg total) by mouth every 8 (eight) hours as needed for nausea or vomiting. 60 tablet 3   pantoprazole (PROTONIX) 40 MG tablet Take 1 tablet (40 mg total) by mouth 2 (two) times daily. 180 tablet 0   pregabalin (LYRICA) 25 MG capsule Take 25 mg by mouth 2 (two) times daily.     QUEtiapine (SEROQUEL) 25 MG tablet Take 1-2 tablets (25-50 mg total) by mouth at bedtime. 180 tablet 0   rosuvastatin (CRESTOR) 10 MG tablet Take 1 tablet (10  mg total) by mouth every other day. 90 tablet 3   tiZANidine (ZANAFLEX) 4 MG tablet Take 1 tablet (4 mg total) by mouth every 8 (eight) hours as needed for muscle spasms. 90 tablet 0   triamcinolone ointment (KENALOG) 0.5 % Apply 1 application topically 2 (two) times daily. 30 g 0   valACYclovir (VALTREX) 1000 MG tablet Take 1 tablet (1,000 mg total) by mouth daily. 180 tablet 0   Vitamin D, Ergocalciferol, (DRISDOL) 1.25 MG (50000 UNIT) CAPS capsule Take 50,000 Units by mouth once a week.     No current facility-administered medications for this visit.    OBJECTIVE: Vitals:   04/20/23 1422  BP: (!) 145/75  Pulse: (!) 50  Resp: 18  Temp: 97.7 F (36.5 C)  SpO2: 98%     Body mass index is 40.17 kg/m.    ECOG FS:0 - Asymptomatic  General: Well-developed, well-nourished, no acute distress. Eyes: Pink conjunctiva, anicteric sclera. HEENT: Normocephalic, moist mucous membranes. Lungs: No audible wheezing or coughing. Heart: Regular rate and rhythm. Abdomen: Soft, nontender, no obvious distention. Musculoskeletal: No edema, cyanosis, or clubbing. Neuro: Alert, answering all questions appropriately. Cranial nerves grossly intact. Skin: No rashes or petechiae noted. Psych: Normal affect.  LAB RESULTS:  Lab Results  Component Value Date   NA 142 10/06/2022   K 4.1 10/06/2022   CL 105 10/06/2022   CO2 21 10/06/2022   GLUCOSE 87 10/06/2022   BUN 13 10/06/2022   CREATININE 0.87 10/06/2022   CALCIUM 9.4 10/06/2022   PROT 6.1 10/06/2022   ALBUMIN 4.6 10/06/2022   AST 17 10/06/2022   ALT 11 10/06/2022   ALKPHOS 67 10/06/2022   BILITOT 0.5 10/06/2022   GFRNONAA 53 (L) 03/18/2018   GFRAA 61 03/18/2018    Lab Results  Component Value Date   WBC 6.8 04/19/2023   NEUTROABS 4.4 04/19/2023   HGB 12.3 04/19/2023   HCT 37.0 04/19/2023   MCV 89.2 04/19/2023  PLT 183 04/19/2023   Lab Results  Component Value Date   IRON 96 04/19/2023   TIBC 384 04/19/2023   IRONPCTSAT 25  04/19/2023   Lab Results  Component Value Date   FERRITIN 15 04/19/2023     STUDIES: NM Myocar Multi W/Spect W/Wall Motion / EF  Result Date: 04/19/2023   The study is normal. The study is low risk.   No ST deviation was noted.   LV perfusion is normal. There is no evidence of ischemia. There is no evidence of infarction.   Left ventricular function is normal. Nuclear stress EF: 59%. End diastolic cavity size is normal. End systolic cavity size is normal.   CT attenuation images showed evidence of aortic calcifications and at least moderate calcifications in the LAD and left circumflex distribution.   ECHOCARDIOGRAM COMPLETE  Result Date: 04/13/2023    ECHOCARDIOGRAM REPORT   Patient Name:   KAYLISE FAUSS Viana Date of Exam: 04/13/2023 Medical Rec #:  [id number]    Height:       64.0 in Accession #:    [id number]   Weight:       238.0 lb Date of Birth:  [birth year]     BSA:          2.106 m Patient Age:    62 years     BP:           130/68 mmHg Patient Gender: F            HR:           52 bpm. Exam Location:  [facility] Procedure: 2D Echo, Strain Analysis, Cardiac Doppler and Color Doppler Indications:    I27.20 Pulmonary Hypertension  History:        Patient has no prior history of Echocardiogram examinations.                 CAD, COPD and PAD, Arrythmias:Bradycardia, Signs/Symptoms:Chest                 Pain, Shortness of Breath and Dyspnea; Risk                 Factors:Dyslipidemia, Hypertension, Current Smoker and Sleep                 Apnea.  Sonographer:    Ilda Mori MHA, BS, RDCS Referring Phys: [id number] Antonieta Iba  Sonographer Comments: Technically difficult study due to poor echo windows and patient is obese. Image acquisition challenging due to patient body habitus and Image acquisition challenging due to COPD. IMPRESSIONS  1. Left ventricular ejection fraction, by estimation, is 60 to 65%. The left ventricle has normal function. The left ventricle has no regional wall motion abnormalities.  Left ventricular diastolic parameters were normal. The average left ventricular global longitudinal strain is -22.3 %.  2. Right ventricular systolic function is normal. The right ventricular size is normal. Tricuspid regurgitation signal is inadequate for assessing PA pressure.  3. Left atrial size was mildly dilated.  4. The mitral valve is normal in structure. No evidence of mitral valve regurgitation. No evidence of mitral stenosis.  5. The aortic valve is normal in structure. Aortic valve regurgitation is not visualized. No aortic stenosis is present.  6. The inferior vena cava is normal in size with greater than 50% respiratory variability, suggesting right atrial pressure of 3 mmHg. FINDINGS  Left Ventricle: Left ventricular ejection fraction, by estimation, is 60 to 65%. The left ventricle has normal function. The left ventricle  has no regional wall motion abnormalities. The average left ventricular global longitudinal strain is -22.3 %. The left ventricular internal cavity size was normal in size. There is no left ventricular hypertrophy. Left ventricular diastolic parameters were normal. Right Ventricle: The right ventricular size is normal. No increase in right ventricular wall thickness. Right ventricular systolic function is normal. Tricuspid regurgitation signal is inadequate for assessing PA pressure. Left Atrium: Left atrial size was mildly dilated. Right Atrium: Right atrial size was normal in size. Pericardium: There is no evidence of pericardial effusion. Mitral Valve: The mitral valve is normal in structure. No evidence of mitral valve regurgitation. No evidence of mitral valve stenosis. Tricuspid Valve: The tricuspid valve is normal in structure. Tricuspid valve regurgitation is not demonstrated. No evidence of tricuspid stenosis. Aortic Valve: The aortic valve is normal in structure. Aortic valve regurgitation is not visualized. No aortic stenosis is present. Pulmonic Valve: The pulmonic valve  was normal in structure. Pulmonic valve regurgitation is not visualized. No evidence of pulmonic stenosis. Aorta: The aortic root is normal in size and structure. Venous: The inferior vena cava is normal in size with greater than 50% respiratory variability, suggesting right atrial pressure of 3 mmHg. IAS/Shunts: No atrial level shunt detected by color flow Doppler.  LEFT VENTRICLE PLAX 2D LVIDd:         4.60 cm Diastology LVIDs:         4.10 cm LV e' medial:    11.20 cm/s LV PW:         0.70 cm LV E/e' medial:  7.3 LV IVS:        0.70 cm LV e' lateral:   12.50 cm/s                        LV E/e' lateral: 6.5                         2D Longitudinal Strain                        2D Strain GLS Avg:     -22.3 % IVC IVC diam: 2.00 cm LEFT ATRIUM           Index LA diam:      3.70 cm 1.76 cm/m LA Vol (A2C): 42.1 ml 19.99 ml/m LA Vol (A4C): 79.3 ml 37.65 ml/m   AORTA Ao Root diam: 3.10 cm Ao Asc diam:  3.10 cm MITRAL VALVE MV Area (PHT): 3.60 cm MV Decel Time: 211 msec MV E velocity: 81.80 cm/s MV A velocity: 74.40 cm/s MV E/A ratio:  1.10 Julien Nordmann MD Electronically signed by Julien Nordmann MD Signature Date/Time: 04/13/2023/6:07:18 PM    Final     ASSESSMENT: Iron deficiency anemia.  PLAN:    Iron deficiency anemia: Resolved.  Patient's hemoglobin and iron stores are within normal limits today.  She cannot tolerate oral iron supplementation.  Colonoscopy on May 12, 2022 removed several polyps, but otherwise no significant pathology.  Patient last received treatment on December 15, 2022.  No intervention is needed at this time.  Return to clinic in 4 months with repeat laboratory work, further evaluation, and continuation of treatment if needed.    I spent a total of 20 minutes reviewing chart data, face-to-face evaluation with the patient, counseling and coordination of care as detailed above.   Patient expressed understanding and was in agreement with this plan. She  also understands that She can call  clinic at any time with any questions, concerns, or complaints.    Jeralyn Ruths, MD   04/20/2023 2:41 PM

## 2023-04-22 ENCOUNTER — Encounter: Payer: 59 | Admitting: Speech Pathology

## 2023-04-27 ENCOUNTER — Encounter: Payer: 59 | Admitting: Speech Pathology

## 2023-04-29 ENCOUNTER — Encounter: Payer: 59 | Admitting: Speech Pathology

## 2023-04-29 ENCOUNTER — Encounter: Payer: Self-pay | Admitting: Gastroenterology

## 2023-04-29 ENCOUNTER — Other Ambulatory Visit: Payer: Self-pay | Admitting: Family Medicine

## 2023-04-29 ENCOUNTER — Ambulatory Visit: Payer: 59 | Admitting: Gastroenterology

## 2023-04-29 VITALS — BP 191/80 | HR 54 | Temp 98.2°F | Ht 65.0 in | Wt 238.0 lb

## 2023-04-29 DIAGNOSIS — R1310 Dysphagia, unspecified: Secondary | ICD-10-CM | POA: Diagnosis not present

## 2023-04-29 DIAGNOSIS — R1319 Other dysphagia: Secondary | ICD-10-CM

## 2023-04-29 NOTE — Progress Notes (Signed)
Primary Care Physician: Dorcas Carrow, DO  Primary Gastroenterologist:  Dr. Midge Minium  Chief Complaint  Patient presents with   Dysphagia    HPI: Hannah Kaiser is a 62 y.o. female here for dysphagia. The patient had the same symptoms in 2018 with a normal EGD.  The patient continues to report that the dysphagia is to pills more than it is to solids.  The patient also reports that she sometimes gets choked and has to cough after drinking or eating.  Past Medical History:  Diagnosis Date   Anemia    Anxiety    Arthritis    knees, thoracic spurs and arthritis   Asthma    Breast pain    CHF (congestive heart failure) (HCC)    Colon polyps    COPD (chronic obstructive pulmonary disease) (HCC)    Depression    Dysrhythmia    GERD (gastroesophageal reflux disease)    Glaucoma    Heart murmur    History of blood transfusion    Hypertension    Motion sickness    cars   Multilevel degenerative disc disease    Neuropathy    Bilateral arms and legs   Nipple discharge    Numbness of both lower extremities    bulging lumbar discs - per pt   Numbness of upper extremity    bilateral - degenerative cervical discs - per pt   Pneumonia    PONV (postoperative nausea and vomiting)    Sleep apnea    has CPAP   Smokers' cough (HCC)    Swelling of limb 03/11/2021   Wears dentures    full upper and lower    Current Outpatient Medications  Medication Sig Dispense Refill   albuterol (VENTOLIN HFA) 108 (90 Base) MCG/ACT inhaler TAKE 2 PUFFS BY MOUTH EVERY 6 HOURS AS NEEDED FOR WHEEZE OR SHORTNESS OF BREATH 18 each 1   amLODipine (NORVASC) 10 MG tablet Take 1 tablet (10 mg total) by mouth daily. 90 tablet 0   aspirin EC 81 MG tablet Take 1 tablet (81 mg total) by mouth daily. 90 tablet 1   atenolol (TENORMIN) 50 MG tablet Take 1 tablet (50 mg total) by mouth daily. 90 tablet 0   busPIRone (BUSPAR) 15 MG tablet Take 1 tablet (15 mg total) by mouth 3 (three) times daily. 270 tablet  0   clonazePAM (KLONOPIN) 1 MG tablet Take 1 tablet (1 mg total) by mouth 2 (two) times daily as needed. for anxiety 30 tablet 0   ezetimibe (ZETIA) 10 MG tablet Take 1 tablet (10 mg total) by mouth daily. 90 tablet 0   FLUoxetine (PROZAC) 40 MG capsule Take 2 capsules (80 mg total) by mouth daily. 180 capsule 0   furosemide (LASIX) 20 MG tablet TAKE 1 TABLET BY MOUTH ONCE DAILY AS NEEDED FOR SWELLING 60 tablet 0   hydrOXYzine (ATARAX) 25 MG tablet Take 1 tablet (25 mg total) by mouth 3 (three) times daily as needed. 270 tablet 0   ibuprofen (ADVIL) 800 MG tablet Take 1 tablet (800 mg total) by mouth every 8 (eight) hours as needed. 270 tablet 1   isosorbide mononitrate (IMDUR) 30 MG 24 hr tablet Take 1 tablet (30 mg total) by mouth daily. 90 tablet 0   latanoprost (XALATAN) 0.005 % ophthalmic solution Place 1 drop into both eyes at bedtime.     losartan (COZAAR) 100 MG tablet Take 1 tablet (100 mg total) by mouth daily. 90 tablet 0  mupirocin ointment (BACTROBAN) 2 % Apply 1 application. topically 2 (two) times daily. 22 g 0   nystatin-triamcinolone ointment (MYCOLOG) Apply 1 application topically 2 (two) times daily. 30 g 1   ondansetron (ZOFRAN) 4 MG tablet Take 1 tablet (4 mg total) by mouth every 8 (eight) hours as needed for nausea or vomiting. 60 tablet 3   pantoprazole (PROTONIX) 40 MG tablet Take 1 tablet (40 mg total) by mouth 2 (two) times daily. 180 tablet 0   pregabalin (LYRICA) 25 MG capsule Take 25 mg by mouth 2 (two) times daily.     QUEtiapine (SEROQUEL) 25 MG tablet Take 1-2 tablets (25-50 mg total) by mouth at bedtime. 180 tablet 0   rosuvastatin (CRESTOR) 10 MG tablet Take 1 tablet (10 mg total) by mouth every other day. 90 tablet 3   tiZANidine (ZANAFLEX) 4 MG tablet Take 1 tablet (4 mg total) by mouth every 8 (eight) hours as needed for muscle spasms. 90 tablet 0   triamcinolone ointment (KENALOG) 0.5 % Apply 1 application topically 2 (two) times daily. 30 g 0   valACYclovir  (VALTREX) 1000 MG tablet Take 1 tablet (1,000 mg total) by mouth daily. 180 tablet 0   Vitamin D, Ergocalciferol, (DRISDOL) 1.25 MG (50000 UNIT) CAPS capsule Take 50,000 Units by mouth once a week.     No current facility-administered medications for this visit.    Allergies as of 04/29/2023 - Review Complete 04/29/2023  Allergen Reaction Noted   Abilify [aripiprazole] Swelling 03/27/2016   Ace inhibitors Cough 03/13/2015   Wellbutrin [bupropion] Other (See Comments) 12/03/2015   Paxlovid [nirmatrelvir-ritonavir] Other (See Comments) 03/03/2021   Azithromycin Rash 03/13/2015   Erythromycin Rash 08/21/2013    ROS:  General: Negative for anorexia, weight loss, fever, chills, fatigue, weakness. ENT: Negative for hoarseness, difficulty swallowing , nasal congestion. CV: Negative for chest pain, angina, palpitations, dyspnea on exertion, peripheral edema.  Respiratory: Negative for dyspnea at rest, dyspnea on exertion, cough, sputum, wheezing.  GI: See history of present illness. GU:  Negative for dysuria, hematuria, urinary incontinence, urinary frequency, nocturnal urination.  Endo: Negative for unusual weight change.    Physical Examination:   BP (!) 191/80 (BP Location: Left Arm, Patient Position: Sitting, Cuff Size: Large)   Pulse (!) 54   Temp 98.2 F (36.8 C) (Oral)   Ht 5\' 5"  (1.651 m)   Wt 238 lb (108 kg)   BMI 39.61 kg/m   General: Well-nourished, well-developed in no acute distress.  Eyes: No icterus. Conjunctivae pink. Neuro: Alert and oriented x 3.  Grossly intact. Skin: Warm and dry, no jaundice.   Psych: Alert and cooperative, normal mood and affect.  Labs:    Imaging Studies: NM Myocar Multi W/Spect W/Wall Motion / EF  Result Date: 04/19/2023   The study is normal. The study is low risk.   No ST deviation was noted.   LV perfusion is normal. There is no evidence of ischemia. There is no evidence of infarction.   Left ventricular function is normal. Nuclear  stress EF: 59%. End diastolic cavity size is normal. End systolic cavity size is normal.   CT attenuation images showed evidence of aortic calcifications and at least moderate calcifications in the LAD and left circumflex distribution.   ECHOCARDIOGRAM COMPLETE  Result Date: 04/13/2023    ECHOCARDIOGRAM REPORT   Patient Name:   Hannah Kaiser Date of Exam: 04/13/2023 Medical Rec #:  [id number]    Height:       64.0  in Accession #:    [id number]   Weight:       238.0 lb Date of Birth:  Sep 15, 1960     BSA:          2.106 m Patient Age:    62 years     BP:           130/68 mmHg Patient Gender: F            HR:           52 bpm. Exam Location:   Procedure: 2D Echo, Strain Analysis, Cardiac Doppler and Color Doppler Indications:    I27.20 Pulmonary Hypertension  History:        Patient has no prior history of Echocardiogram examinations.                 CAD, COPD and PAD, Arrythmias:Bradycardia, Signs/Symptoms:Chest                 Pain, Shortness of Breath and Dyspnea; Risk                 Factors:Dyslipidemia, Hypertension, Current Smoker and Sleep                 Apnea.  Sonographer:    Ilda Mori MHA, BS, RDCS Referring Phys: [id number] Antonieta Iba  Sonographer Comments: Technically difficult study due to poor echo windows and patient is obese. Image acquisition challenging due to patient body habitus and Image acquisition challenging due to COPD. IMPRESSIONS  1. Left ventricular ejection fraction, by estimation, is 60 to 65%. The left ventricle has normal function. The left ventricle has no regional wall motion abnormalities. Left ventricular diastolic parameters were normal. The average left ventricular global longitudinal strain is -22.3 %.  2. Right ventricular systolic function is normal. The right ventricular size is normal. Tricuspid regurgitation signal is inadequate for assessing PA pressure.  3. Left atrial size was mildly dilated.  4. The mitral valve is normal in structure. No evidence of  mitral valve regurgitation. No evidence of mitral stenosis.  5. The aortic valve is normal in structure. Aortic valve regurgitation is not visualized. No aortic stenosis is present.  6. The inferior vena cava is normal in size with greater than 50% respiratory variability, suggesting right atrial pressure of 3 mmHg. FINDINGS  Left Ventricle: Left ventricular ejection fraction, by estimation, is 60 to 65%. The left ventricle has normal function. The left ventricle has no regional wall motion abnormalities. The average left ventricular global longitudinal strain is -22.3 %. The left ventricular internal cavity size was normal in size. There is no left ventricular hypertrophy. Left ventricular diastolic parameters were normal. Right Ventricle: The right ventricular size is normal. No increase in right ventricular wall thickness. Right ventricular systolic function is normal. Tricuspid regurgitation signal is inadequate for assessing PA pressure. Left Atrium: Left atrial size was mildly dilated. Right Atrium: Right atrial size was normal in size. Pericardium: There is no evidence of pericardial effusion. Mitral Valve: The mitral valve is normal in structure. No evidence of mitral valve regurgitation. No evidence of mitral valve stenosis. Tricuspid Valve: The tricuspid valve is normal in structure. Tricuspid valve regurgitation is not demonstrated. No evidence of tricuspid stenosis. Aortic Valve: The aortic valve is normal in structure. Aortic valve regurgitation is not visualized. No aortic stenosis is present. Pulmonic Valve: The pulmonic valve was normal in structure. Pulmonic valve regurgitation is not visualized. No evidence of pulmonic stenosis. Aorta: The aortic root is normal  in size and structure. Venous: The inferior vena cava is normal in size with greater than 50% respiratory variability, suggesting right atrial pressure of 3 mmHg. IAS/Shunts: No atrial level shunt detected by color flow Doppler.  LEFT  VENTRICLE PLAX 2D LVIDd:         4.60 cm Diastology LVIDs:         4.10 cm LV e' medial:    11.20 cm/s LV PW:         0.70 cm LV E/e' medial:  7.3 LV IVS:        0.70 cm LV e' lateral:   12.50 cm/s                        LV E/e' lateral: 6.5                         2D Longitudinal Strain                        2D Strain GLS Avg:     -22.3 % IVC IVC diam: 2.00 cm LEFT ATRIUM           Index LA diam:      3.70 cm 1.76 cm/m LA Vol (A2C): 42.1 ml 19.99 ml/m LA Vol (A4C): 79.3 ml 37.65 ml/m   AORTA Ao Root diam: 3.10 cm Ao Asc diam:  3.10 cm MITRAL VALVE MV Area (PHT): 3.60 cm MV Decel Time: 211 msec MV E velocity: 81.80 cm/s MV A velocity: 74.40 cm/s MV E/A ratio:  1.10 Julien Nordmann MD Electronically signed by Julien Nordmann MD Signature Date/Time: 04/13/2023/6:07:18 PM    Final     Assessment and Plan:   Hannah Kaiser is a 63 y.o. y/o female comes in with dysphagia with a history of dysphagia with a normal EGD the last time she had dysphagia.  The patient sounds like she is having more problems with pills which may be due to a oropharyngeal dysphagia more than a structural abnormality of the esophagus.  The patient will be set up for a modified barium swallow to look for a issue with her swallowing.  The patient has been explained the plan and agrees with it.     Midge Minium, MD. Clementeen Graham    Note: This dictation was prepared with Dragon dictation along with smaller phrase technology. Any transcriptional errors that result from this process are unintentional.

## 2023-04-30 NOTE — Telephone Encounter (Signed)
Requested Prescriptions  Pending Prescriptions Disp Refills   isosorbide mononitrate (IMDUR) 30 MG 24 hr tablet [Pharmacy Med Name: ISOSORBIDE MONONIT ER 30 MG TB] 90 tablet 1    Sig: TAKE 1 TABLET BY MOUTH EVERY DAY     Cardiovascular:  Nitrates Failed - 04/29/2023  2:34 AM      Failed - Last BP in normal range    BP Readings from Last 1 Encounters:  04/29/23 (!) 191/80         Passed - Last Heart Rate in normal range    Pulse Readings from Last 1 Encounters:  04/29/23 (!) 54         Passed - Valid encounter within last 12 months    Recent Outpatient Visits           1 month ago Oropharyngeal dysphagia   [place] [facility] [place], Megan P, DO   5 months ago Acute pain of left knee   [place] [facility] [place], Megan P, DO   6 months ago Benign hypertensive renal disease   [place] [facility] [place], Megan P, DO   8 months ago Other osteoarthritis of spine, lumbar region   [facility] [place], Megan P, DO   9 months ago Benign hypertensive renal disease   [place] [facility] Dorcas Carrow, DO       Future Appointments             In 2 weeks Laural Benes, Oralia Rud, DO [place] [facility]

## 2023-05-04 ENCOUNTER — Encounter: Payer: 59 | Admitting: Speech Pathology

## 2023-05-11 ENCOUNTER — Ambulatory Visit
Admission: RE | Admit: 2023-05-11 | Discharge: 2023-05-11 | Disposition: A | Discharge status: Home / Self Care | Payer: 59 | Source: Ambulatory Visit | Attending: Gastroenterology | Admitting: Gastroenterology

## 2023-05-11 ENCOUNTER — Encounter: Payer: 59 | Admitting: Speech Pathology

## 2023-05-11 DIAGNOSIS — R1319 Other dysphagia: Secondary | ICD-10-CM | POA: Diagnosis present

## 2023-05-11 NOTE — Progress Notes (Signed)
Modified Barium Swallow Study  Patient Details  Name: Hannah Kaiser MRN: [id number] Date of Birth: 1960-10-24  Today's Date: 05/11/2023  Modified Barium Swallow completed.  Full report located under Chart Review in the Imaging Section.  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  Ongoing history of smoking/COPD, 1 ppd.  Enlarged pulm monic trunk concerning for pulmonary artery hypertension.   Per last CT Imaging of Chest in 01/2023: Mild centrilobular emphysema. Smoking related  respiratory bronchiolitis. Calcified granulomas. 11.2 mm  ground-glass left upper lobe nodule, stable. 2.2 mm right upper lobe  nodule, also stable. No new pulmonary nodules. No pleural fluid.  Airway is unremarkable.   She is being referred by GI d/t c/o Esophageal phase Dysmotility: "reports that the dysphagia is to pills more than it is to solids".  Pt also reports she does "not drink liquids when I eat" at meals.  Wears full Dentures.  Pt reports No h/o pneumonia nor stroke/neurological dx. Noted cervical fusion; no impact.    Clinical Impression Patient presents with functional oropharyngeal swallowing for age, and in setting of Baseline h/o Esophageal phase Dysmotility. ANY Esophageal phase deficits can increase risk for Retrograde flow, slow clearing of the Esophagus, an/or  impact the oropharyngeal phase of swallowing. No aspiration nor laryngeal penetratiion noted during this study w/ consistencies.    Oral stage is characterized by adequate lip closure, bolus preparation, mastication, and containment, and anterior to posterior transit. Swallow initiation occurs primarily at the level of the valleculae w/ inconsistent spillage  from the valleculae  w/ thin liquids x2.   Pharyngeal stage is noted for adequate tongue base retraction, adequate hyolaryngeal excursion, and adequate pharyngeal constriction. No pharyngeal residue remained post swallow. Pharyngeal stripping wave is complete. Epiglottic inversion is complete. No aspiration or laryngeal penetration occurred during all trials.   Amplitude/duration of cricopharyngeus opening appeared [facility]. There was adequate/complete clearance through the cervical>mid Esophagus via A-P view. An Esophageal sweep was performed in the upright, A-P  position which revealed fairly timely bolus transit to mid Esophagus. A 13 mm barium tablet given in Puree cleared the pharyngoesophageal areas viewable. OF NOTE: post this study, pt c/o discomfort in the lower abdominal area w/ the few trials given.  Pt was educated on the results of the study immediately following; agreed. Factors that may increase risk of adverse event in presence of aspiration Rubye Oaks [employer] Clearance Coots 2021):  (none)   Swallow Evaluation Recommendations Recommendations: PO diet PO Diet Recommendation: Regular;Thin liquids (Level 0) (well-cut, moistened meats/foods) Liquid Administration via: Cup;Straw Medication Administration: Whole meds with puree (for ease of clearing) Supervision: Patient able to self-feed Swallowing strategies  : Minimize environmental distractions;Slow rate;Small bites/sips;Follow solids with liquids ((pt stated she does not drink liquids when eating meals)) Postural changes: Position pt fully upright for meals;Stay upright 30-60 min after meals;Out of bed for meals (REFLUX precautions) Oral care recommendations: Oral care BID (2x/day);Pt independent with oral care Recommended consults: Consider ENT consultation;Consider GI consultation (continue f/u w/ for Esophageal phase Dysmotility; vocal quality)       Jerilynn Som, MS, CCC-SLP Speech Language Pathologist Rehab Services; [facility] [phone] (ascom) Anslie Spadafora 05/11/2023,4:19 PM

## 2023-05-12 ENCOUNTER — Encounter: Payer: Self-pay | Admitting: Oncology

## 2023-05-13 ENCOUNTER — Encounter: Payer: 59 | Admitting: Speech Pathology

## 2023-05-14 ENCOUNTER — Other Ambulatory Visit: Payer: Self-pay | Admitting: Family Medicine

## 2023-05-17 ENCOUNTER — Encounter: Payer: Self-pay | Admitting: Family Medicine

## 2023-05-17 ENCOUNTER — Ambulatory Visit (INDEPENDENT_AMBULATORY_CARE_PROVIDER_SITE_OTHER): Payer: 59 | Admitting: Family Medicine

## 2023-05-17 VITALS — BP 147/80 | HR 54 | Ht 64.5 in | Wt 235.4 lb

## 2023-05-17 DIAGNOSIS — D509 Iron deficiency anemia, unspecified: Secondary | ICD-10-CM | POA: Diagnosis not present

## 2023-05-17 DIAGNOSIS — E782 Mixed hyperlipidemia: Secondary | ICD-10-CM | POA: Diagnosis not present

## 2023-05-17 DIAGNOSIS — J441 Chronic obstructive pulmonary disease with (acute) exacerbation: Secondary | ICD-10-CM

## 2023-05-17 DIAGNOSIS — F332 Major depressive disorder, recurrent severe without psychotic features: Secondary | ICD-10-CM

## 2023-05-17 DIAGNOSIS — I129 Hypertensive chronic kidney disease with stage 1 through stage 4 chronic kidney disease, or unspecified chronic kidney disease: Secondary | ICD-10-CM | POA: Diagnosis not present

## 2023-05-17 DIAGNOSIS — Z0000 Encounter for general adult medical examination without abnormal findings: Secondary | ICD-10-CM

## 2023-05-17 DIAGNOSIS — F419 Anxiety disorder, unspecified: Secondary | ICD-10-CM

## 2023-05-17 DIAGNOSIS — D692 Other nonthrombocytopenic purpura: Secondary | ICD-10-CM

## 2023-05-17 DIAGNOSIS — I25118 Atherosclerotic heart disease of native coronary artery with other forms of angina pectoris: Secondary | ICD-10-CM

## 2023-05-17 LAB — MICROALBUMIN, URINE WAIVED
Creatinine, Urine Waived: 200 mg/dL (ref 10–300)
Microalb, Ur Waived: 30 mg/L — ABNORMAL HIGH (ref 0–19)
Microalb/Creat Ratio: <30 mg/g (ref <30)

## 2023-05-17 LAB — BAYER DCA HB A1C WAIVED: HB A1C (BAYER DCA - WAIVED): 5.7 % — ABNORMAL HIGH (ref 4.8–5.6)

## 2023-05-17 MED ORDER — CARIPRAZINE HCL 1.5 MG PO CAPS: 1.5000 mg | ORAL_CAPSULE | ORAL | 2 refills | Status: DC

## 2023-05-17 MED ORDER — ISOSORBIDE MONONITRATE ER 60 MG PO TB24: 60.0000 mg | ORAL_TABLET | Freq: Every day | ORAL | 1 refills | Status: DC

## 2023-05-17 MED ORDER — AMLODIPINE BESYLATE 10 MG PO TABS: 10.0000 mg | ORAL_TABLET | Freq: Every day | ORAL | 1 refills | Status: DC

## 2023-05-17 MED ORDER — CLONAZEPAM 1 MG PO TABS: 1.0000 mg | ORAL_TABLET | Freq: Two times a day (BID) | ORAL | 0 refills | Status: DC | PRN

## 2023-05-17 MED ORDER — VALACYCLOVIR HCL 1 G PO TABS: 1000.0000 mg | ORAL_TABLET | Freq: Every day | ORAL | 1 refills | Status: DC

## 2023-05-17 MED ORDER — ALBUTEROL SULFATE HFA 108 (90 BASE) MCG/ACT IN AERS: 1.0000 | INHALATION_SPRAY | Freq: Four times a day (QID) | RESPIRATORY_TRACT | 1 refills | Status: DC | PRN

## 2023-05-17 MED ORDER — HYDROXYZINE HCL 25 MG PO TABS: 25.0000 mg | ORAL_TABLET | Freq: Three times a day (TID) | ORAL | 1 refills | Status: DC | PRN

## 2023-05-17 MED ORDER — TIZANIDINE HCL 4 MG PO TABS: 4.0000 mg | ORAL_TABLET | Freq: Three times a day (TID) | ORAL | 1 refills | Status: DC | PRN

## 2023-05-17 MED ORDER — BREZTRI AEROSPHERE 160-9-4.8 MCG/ACT IN AERO: 2.0000 | INHALATION_SPRAY | Freq: Two times a day (BID) | RESPIRATORY_TRACT | 11 refills | Status: DC

## 2023-05-17 MED ORDER — FUROSEMIDE 20 MG PO TABS: ORAL_TABLET | ORAL | 3 refills | Status: DC

## 2023-05-17 MED ORDER — BUSPIRONE HCL 15 MG PO TABS: 15.0000 mg | ORAL_TABLET | Freq: Three times a day (TID) | ORAL | 1 refills | Status: DC

## 2023-05-17 MED ORDER — ATENOLOL 50 MG PO TABS: 50.0000 mg | ORAL_TABLET | Freq: Every day | ORAL | 1 refills | Status: DC

## 2023-05-17 MED ORDER — FLUOXETINE HCL 40 MG PO CAPS: 80.0000 mg | ORAL_CAPSULE | Freq: Every day | ORAL | 1 refills | Status: DC

## 2023-05-17 MED ORDER — EZETIMIBE 10 MG PO TABS: 10.0000 mg | ORAL_TABLET | Freq: Every day | ORAL | 1 refills | Status: DC

## 2023-05-17 MED ORDER — LOSARTAN POTASSIUM 100 MG PO TABS: 100.0000 mg | ORAL_TABLET | Freq: Every day | ORAL | 1 refills | Status: DC

## 2023-05-17 MED ORDER — PANTOPRAZOLE SODIUM 40 MG PO TBEC: 40.0000 mg | DELAYED_RELEASE_TABLET | Freq: Two times a day (BID) | ORAL | 1 refills | Status: DC

## 2023-05-17 NOTE — Assessment & Plan Note (Signed)
Not doing well. Will change from seroquel to vraylar and recheck in about a month. Call with any concerns.

## 2023-05-17 NOTE — Assessment & Plan Note (Signed)
Will change her seroquel to vraylar and recheck in 1 month. Call with any concerns.

## 2023-05-17 NOTE — Assessment & Plan Note (Signed)
Will increase her imdur to 60mg  and recheck in 4 weeks. Call with any concerns.

## 2023-05-17 NOTE — Assessment & Plan Note (Signed)
Encouraged diet and exercise with goal of losing 1-2lbs per week. Consider wegovy due to CAD in the future.

## 2023-05-17 NOTE — Progress Notes (Signed)
BP (!) 147/80   Pulse (!) 54   Ht 5' 4.5" (1.638 m)   Wt 235 lb 6.4 oz (106.8 kg)   SpO2 97%   BMI 39.78 kg/m    Subjective:    Patient ID: Hannah Kaiser, female    DOB: June 29, 1960, 62 y.o.   MRN: [id number]  HPI: Hannah Kaiser is a 62 y.o. female presenting on 05/17/2023 for comprehensive medical examination. Current medical complaints include:  HYPERTENSION / HYPERLIPIDEMIA Satisfied with current treatment? no Duration of hypertension: chronic BP monitoring frequency: rarely BP medication side effects: no Past BP meds: lasix, imdur, losartan, atenolol Duration of hyperlipidemia: chronic Cholesterol medication side effects: no Cholesterol supplements: none Past cholesterol medications: zetia, crestor Medication compliance: excellent compliance Aspirin: yes Recent stressors: yes Recurrent headaches: no Visual changes: no Palpitations: yes Dyspnea: no Chest pain: yes Lower extremity edema: no Dizzy/lightheaded: no  COPD COPD status: uncontrolled Satisfied with current treatment?: no Oxygen use: no Dyspnea frequency:  Cough frequency:  Rescue inhaler frequency:   Limitation of activity: no Productive cough:  Last Spirometry:  Pneumovax: Up to Date Influenza: Up to Date  ANXIETY/DEPRESSION Duration: chronic Status:exacerbated Anxious mood: yes  Excessive worrying: yes Irritability: yes  Sweating: no Nausea: no Palpitations:no Hyperventilation: no Panic attacks: no Agoraphobia: no  Obscessions/compulsions: no Depressed mood: yes    05/17/2023    1:07 PM 03/16/2023    2:18 PM 11/19/2022    8:49 AM 10/06/2022    1:20 PM 08/18/2022    9:26 AM  Depression screen PHQ 2/9  Decreased Interest 1 1 1  0 1  Down, Depressed, Hopeless 0 1 1 0 0  PHQ - 2 Score 1 2 2  0 1  Altered sleeping 2 2 1 1 2   Tired, decreased energy 3 2 1 1 2   Change in appetite 3 3 1 2 2   Feeling bad or failure about yourself  0 1 0 0 0  Trouble concentrating 0 1 1 2  0  Moving slowly or  fidgety/restless 0 1 0 1 0  Suicidal thoughts 0 0 0 0 0  PHQ-9 Score 9 12 6 7 7   Difficult doing work/chores Somewhat difficult Somewhat difficult Somewhat difficult Somewhat difficult Somewhat difficult   Anhedonia: no Weight changes: no Insomnia: no   Hypersomnia: no Fatigue/loss of energy: yes Feelings of worthlessness: no Feelings of guilt: no Impaired concentration/indecisiveness: no Suicidal ideations: no  Crying spells: no Recent Stressors/Life Changes: yes   Relationship problems: no   Family stress: yes     Financial stress: yes    Job stress: no    Recent death/loss: yes  She currently lives with: her sister Menopausal Symptoms: no  Depression Screen done today and results listed below:     05/17/2023    1:07 PM 03/16/2023    2:18 PM 11/19/2022    8:49 AM 10/06/2022    1:20 PM 08/18/2022    9:26 AM  Depression screen PHQ 2/9  Decreased Interest 1 1 1  0 1  Down, Depressed, Hopeless 0 1 1 0 0  PHQ - 2 Score 1 2 2  0 1  Altered sleeping 2 2 1 1 2   Tired, decreased energy 3 2 1 1 2   Change in appetite 3 3 1 2 2   Feeling bad or failure about yourself  0 1 0 0 0  Trouble concentrating 0 1 1 2  0  Moving slowly or fidgety/restless 0 1 0 1 0  Suicidal thoughts 0 0 0 0 0  PHQ-9 Score 9 12 6 7 7   Difficult doing work/chores Somewhat difficult Somewhat difficult Somewhat difficult Somewhat difficult Somewhat difficult    Past Medical History:  Past Medical History:  Diagnosis Date   Anemia    Anxiety    Arthritis    knees, thoracic spurs and arthritis   Asthma    Breast pain    CHF (congestive heart failure) (HCC)    Colon polyps    COPD (chronic obstructive pulmonary disease) (HCC)    Depression    Dysrhythmia    GERD (gastroesophageal reflux disease)    Glaucoma    Heart murmur    History of blood transfusion    Hypertension    Motion sickness    cars   Multilevel degenerative disc disease    Neuropathy    Bilateral arms and legs   Nipple discharge     Numbness of both lower extremities    bulging lumbar discs - per pt   Numbness of upper extremity    bilateral - degenerative cervical discs - per pt   Pneumonia    PONV (postoperative nausea and vomiting)    Sleep apnea    has CPAP   Smokers' cough (HCC)    Swelling of limb 03/11/2021   Wears dentures    full upper and lower    Surgical History:  Past Surgical History:  Procedure Laterality Date   ABDOMINAL HYSTERECTOMY     BACK SURGERY     BELPHAROPTOSIS REPAIR     BROW LIFT Bilateral 03/24/2016   Procedure: BLEPHAROPLASTY;  Surgeon: Imagene Riches, MD;  Location: [facility];  Service: Ophthalmology;  Laterality: Bilateral;  sleep apnea   CARDIAC CATHETERIZATION     COLONOSCOPY WITH PROPOFOL N/A 10/19/2016   Procedure: COLONOSCOPY WITH PROPOFOL;  Surgeon: Midge Minium, MD;  Location: [facility];  Service: Endoscopy;  Laterality: N/A;   COLONOSCOPY WITH PROPOFOL N/A 05/12/2022   Procedure: COLONOSCOPY WITH PROPOFOL;  Surgeon: Midge Minium, MD;  Location: [facility];  Service: Endoscopy;  Laterality: N/A;   DILATION AND CURETTAGE OF UTERUS     ECTOPIC PREGNANCY SURGERY     x2   ESOPHAGOGASTRODUODENOSCOPY (EGD) WITH PROPOFOL N/A 10/19/2016   Procedure: ESOPHAGOGASTRODUODENOSCOPY (EGD) WITH PROPOFOL;  Surgeon: Midge Minium, MD;  Location: [facility];  Service: Endoscopy;  Laterality: N/A;   LEFT HEART CATH AND CORONARY ANGIOGRAPHY Left 12/29/2016   Procedure: Left Heart Cath and Coronary Angiography;  Surgeon: Laurier Nancy, MD;  [facility];  Service: Cardiovascular;  Laterality: Left;   NECK SURGERY  2003   POLYPECTOMY  10/19/2016   Procedure: POLYPECTOMY;  Surgeon: Midge Minium, MD;  Location: [facility];  Service: Endoscopy;;   SPINE SURGERY  1989, 1998   TOTAL ABDOMINAL HYSTERECTOMY W/ BILATERAL SALPINGOOPHORECTOMY  2014   TUBAL LIGATION      Medications:  Current Outpatient Medications on File Prior to Visit   Medication Sig   aspirin EC 81 MG tablet Take 1 tablet (81 mg total) by mouth daily.   ibuprofen (ADVIL) 800 MG tablet Take 1 tablet (800 mg total) by mouth every 8 (eight) hours as needed.   latanoprost (XALATAN) 0.005 % ophthalmic solution Place 1 drop into both eyes at bedtime.   mupirocin ointment (BACTROBAN) 2 % Apply 1 application. topically 2 (two) times daily.   nystatin-triamcinolone ointment (MYCOLOG) Apply 1 application topically 2 (two) times daily.   ondansetron (ZOFRAN) 4 MG tablet Take 1 tablet (4 mg total)  by mouth every 8 (eight) hours as needed for nausea or vomiting.   pregabalin (LYRICA) 25 MG capsule Take 25 mg by mouth 2 (two) times daily.   rosuvastatin (CRESTOR) 10 MG tablet Take 1 tablet (10 mg total) by mouth every other day.   triamcinolone ointment (KENALOG) 0.5 % Apply 1 application topically 2 (two) times daily.   Vitamin D, Ergocalciferol, (DRISDOL) 1.25 MG (50000 UNIT) CAPS capsule Take 50,000 Units by mouth once a week.   No current facility-administered medications on file prior to visit.    Allergies:  Allergies  Allergen Reactions   Abilify [Aripiprazole] Swelling    Whole  body swelling, retains fluid   Ace Inhibitors Cough    So bad she vomited Other reaction(s): Cough So bad she vomited Other reaction(s): Cough So bad she vomited    Wellbutrin [Bupropion] Other (See Comments)    Worsened depression   Paxlovid [Nirmatrelvir-Ritonavir] Other (See Comments)    Ulcers in mouth   Azithromycin Rash   Erythromycin Rash    Social History:  Social History   Socioeconomic History   Marital status: Legally Separated    Spouse name: Not on file   Number of children: Not on file   Years of education: Not on file   Highest education level: Not on file  Occupational History   Not on file  Tobacco Use   Smoking status: Every Day    Current packs/day: 1.50    Average packs/day: 1.5 packs/day for 41.0 years (61.5 ttl pk-yrs)    Types:  Cigarettes   Smokeless tobacco: Never   Tobacco comments:    since age 18  Vaping Use   Vaping status: Never Used  Substance and Sexual Activity   Alcohol use: No    Alcohol/week: 0.0 standard drinks of alcohol   Drug use: No   Sexual activity: Not Currently    Birth control/protection: Surgical  Other Topics Concern   Not on file  Social History Narrative   Not on file   Social Determinants of Health   Financial Resource Strain: Medium Risk (10/31/2019)   Received from [facility], [facility]   Overall Financial Resource Strain (CARDIA)    Difficulty of Paying Living Expenses: Somewhat hard  Food Insecurity: No Food Insecurity (10/31/2019)   Received from [facility], [facility]   Hunger Vital Sign    Worried About Running Out of Food in the Last Year: Never true    Ran Out of Food in the Last Year: Never true  Transportation Needs: No Transportation Needs (10/31/2019)   [facility] from [facility], [facility]   PRAPARE - Transportation    Lack of Transportation (Medical): No    Lack of Transportation (Non-Medical): No  Physical Activity: Not on file  Stress: Not on file  Social Connections: Not on file  Intimate Partner Violence: Not on file   Social History   Tobacco Use  Smoking Status Every Day   Current packs/day: 1.50   Average packs/day: 1.5 packs/day for 41.0 years (61.5 ttl pk-yrs)   Types: Cigarettes  Smokeless Tobacco Never  Tobacco Comments   since age 38   Social History   Substance and Sexual Activity  Alcohol Use No   Alcohol/week: 0.0 standard drinks of alcohol    Family History:  Family History  Problem Relation Age of Onset   Cancer Maternal Uncle        lung   Cancer Maternal Grandfather  prostate   Cancer Brother        melanoma   Breast cancer Neg Hx     Past medical history, surgical history, medications, allergies, family history and social history reviewed with patient today and changes made to  appropriate areas of the chart.   Review of Systems  Constitutional: Negative.   HENT: Negative.         Sores in her nose  Eyes:  Positive for blurred vision. Negative for double vision, photophobia, pain, discharge and redness.  Respiratory:  Positive for cough and shortness of breath. Negative for hemoptysis, sputum production and wheezing.   Cardiovascular:  Positive for chest pain, palpitations and leg swelling. Negative for orthopnea, claudication and PND.  Gastrointestinal:  Positive for constipation. Negative for abdominal pain, blood in stool, diarrhea, heartburn, melena, nausea and vomiting.  Genitourinary: Negative.   Musculoskeletal:  Positive for back pain and joint pain. Negative for falls, myalgias and neck pain.  Skin: Negative.   Neurological:  Positive for tingling. Negative for dizziness, tremors, sensory change, speech change, focal weakness, seizures, loss of consciousness, weakness and headaches.  Endo/Heme/Allergies:  Positive for polydipsia. Negative for environmental allergies. Bruises/bleeds easily.  Psychiatric/Behavioral:  Positive for depression. Negative for hallucinations, memory loss, substance abuse and suicidal ideas. The patient is nervous/anxious. The patient does not have insomnia.    All other ROS negative except what is listed above and in the HPI.      Objective:    BP (!) 147/80   Pulse (!) 54   Ht 5' 4.5" (1.638 m)   Wt 235 lb 6.4 oz (106.8 kg)   SpO2 97%   BMI 39.78 kg/m   Wt Readings from Last 3 Encounters:  05/17/23 235 lb 6.4 oz (106.8 kg)  04/29/23 238 lb (108 kg)  04/20/23 234 lb (106.1 kg)    Physical Exam Vitals and nursing note reviewed.  Constitutional:      General: She is not in acute distress.    Appearance: Normal appearance. She is obese. She is not ill-appearing, toxic-appearing or diaphoretic.  HENT:     Head: Normocephalic and atraumatic.     Right Ear: Tympanic membrane, ear canal and external ear normal. There is  no impacted cerumen.     Left Ear: Tympanic membrane, ear canal and external ear normal. There is no impacted cerumen.     Nose: Nose normal. No congestion or rhinorrhea.     Mouth/Throat:     Mouth: Mucous membranes are moist.     Pharynx: Oropharynx is clear. No oropharyngeal exudate or posterior oropharyngeal erythema.  Eyes:     General: No scleral icterus.       Right eye: No discharge.        Left eye: No discharge.     Extraocular Movements: Extraocular movements intact.     Conjunctiva/sclera: Conjunctivae normal.     Pupils: Pupils are equal, round, and reactive to light.  Neck:     Vascular: No carotid bruit.  Cardiovascular:     Rate and Rhythm: Normal rate and regular rhythm.     Pulses: Normal pulses.     Heart sounds: No murmur heard.    No friction rub. No gallop.  Pulmonary:     Effort: Pulmonary effort is normal. No respiratory distress.     Breath sounds: Normal breath sounds. No stridor. No wheezing, rhonchi or rales.  Chest:     Chest wall: No tenderness.  Abdominal:     General: Abdomen  is flat. Bowel sounds are normal. There is no distension.     Palpations: Abdomen is soft. There is no mass.     Tenderness: There is no abdominal tenderness. There is no right CVA tenderness, left CVA tenderness, guarding or rebound.     Hernia: No hernia is present.  Genitourinary:    Comments: Breast and pelvic exams deferred with shared decision making Musculoskeletal:        General: No swelling, tenderness, deformity or signs of injury.     Cervical back: Normal range of motion and neck supple. No rigidity. No muscular tenderness.     Right lower leg: No edema.     Left lower leg: No edema.  Lymphadenopathy:     Cervical: No cervical adenopathy.  Skin:    General: Skin is warm and dry.     Capillary Refill: Capillary refill takes less than 2 seconds.     Coloration: Skin is not jaundiced or pale.     Findings: No bruising, erythema, lesion or rash.  Neurological:      General: No focal deficit present.     Mental Status: She is alert and oriented to person, place, and time. Mental status is at baseline.     Cranial Nerves: No cranial nerve deficit.     Sensory: No sensory deficit.     Motor: No weakness.     Coordination: Coordination normal.     Gait: Gait normal.     Deep Tendon Reflexes: Reflexes normal.  Psychiatric:        Mood and Affect: Mood normal.        Behavior: Behavior normal.        Thought Content: Thought content normal.        Judgment: Judgment normal.     Results for orders placed or performed in visit on 04/19/23  Iron and TIBC([facility])  Result Value Ref Range   Iron 96 28 - 170 ug/dL   TIBC 130 865 - 784 ug/dL   Saturation Ratios 25 10.4 - 31.8 %   UIBC 288 ug/dL  Ferritin  Result Value Ref Range   Ferritin 15 11 - 307 ng/mL  CBC with Differential/Platelet  Result Value Ref Range   WBC 6.8 4.0 - 10.5 K/uL   RBC 4.15 3.87 - 5.11 MIL/uL   Hemoglobin 12.3 12.0 - 15.0 g/dL   HCT 69.6 29.5 - 28.4 %   MCV 89.2 80.0 - 100.0 fL   MCH 29.6 26.0 - 34.0 pg   MCHC 33.2 30.0 - 36.0 g/dL   RDW 13.2 44.0 - 10.2 %   Platelets 183 150 - 400 K/uL   nRBC 0.0 0.0 - 0.2 %   Neutrophils Relative % 64 %   Neutro Abs 4.4 1.7 - 7.7 K/uL   Lymphocytes Relative 27 %   Lymphs Abs 1.8 0.7 - 4.0 K/uL   Monocytes Relative 6 %   Monocytes Absolute 0.4 0.1 - 1.0 K/uL   Eosinophils Relative 3 %   Eosinophils Absolute 0.2 0.0 - 0.5 K/uL   Basophils Relative 0 %   Basophils Absolute 0.0 0.0 - 0.1 K/uL   Immature Granulocytes 0 %   Abs Immature Granulocytes 0.03 0.00 - 0.07 K/uL      Assessment & Plan:   Problem List Items Addressed This Visit       Cardiovascular and Mediastinum   CAD (coronary artery disease)    Will keep BP and cholesterol under good control. Will increase her imdur to  60mg  and recheck in 1 month.       Relevant Medications   isosorbide mononitrate (IMDUR) 60 MG 24 hr tablet   amLODipine (NORVASC)  10 MG tablet   atenolol (TENORMIN) 50 MG tablet   ezetimibe (ZETIA) 10 MG tablet   furosemide (LASIX) 20 MG tablet   losartan (COZAAR) 100 MG tablet   Senile purpura (HCC)    Reassured patient. Continue to monitor.       Relevant Medications   isosorbide mononitrate (IMDUR) 60 MG 24 hr tablet   amLODipine (NORVASC) 10 MG tablet   atenolol (TENORMIN) 50 MG tablet   ezetimibe (ZETIA) 10 MG tablet   furosemide (LASIX) 20 MG tablet   losartan (COZAAR) 100 MG tablet     Respiratory   Chronic obstructive pulmonary disease with acute exacerbation (HCC)    Not doing well. Will start breztri. Recheck 1 month. Call with any concerns.        Relevant Medications   albuterol (VENTOLIN HFA) 108 (90 Base) MCG/ACT inhaler   Budeson-Glycopyrrol-Formoterol (BREZTRI AEROSPHERE) 160-9-4.8 MCG/ACT AERO     Genitourinary   Benign hypertensive renal disease    Will increase her imdur to 60mg  and recheck in 4 weeks. Call with any concerns.       Relevant Orders   Microalbumin, Urine Waived   Comprehensive metabolic panel   Lipid Panel w/o Chol/HDL Ratio   TSH     Other   Hyperlipidemia    Under good control on current regimen. Continue current regimen. Continue to monitor. Call with any concerns. Refills given. Labs drawn today.       Relevant Medications   isosorbide mononitrate (IMDUR) 60 MG 24 hr tablet   amLODipine (NORVASC) 10 MG tablet   atenolol (TENORMIN) 50 MG tablet   ezetimibe (ZETIA) 10 MG tablet   furosemide (LASIX) 20 MG tablet   losartan (COZAAR) 100 MG tablet   Other Relevant Orders   Lipid Panel w/o Chol/HDL Ratio   Severe episode of recurrent major depressive disorder, without psychotic features (HCC)    Not doing well. Will change from seroquel to vraylar and recheck in about a month. Call with any concerns.       Relevant Medications   busPIRone (BUSPAR) 15 MG tablet   FLUoxetine (PROZAC) 40 MG capsule   hydrOXYzine (ATARAX) 25 MG tablet   Other Relevant  Orders   TSH   Anxiety    Will change her seroquel to vraylar and recheck in 1 month. Call with any concerns.       Relevant Medications   busPIRone (BUSPAR) 15 MG tablet   FLUoxetine (PROZAC) 40 MG capsule   hydrOXYzine (ATARAX) 25 MG tablet   Iron deficiency anemia    Rechecking labs today. Await results. Treat as needed.       Relevant Orders   CBC with Differential/Platelet   Morbid obesity (HCC)    Encouraged diet and exercise with goal of losing 1-2lbs per week. Consider wegovy due to CAD in the future.       Relevant Orders   Bayer DCA Hb A1c Waived   Other Visit Diagnoses     Routine general medical examination at a health care facility    -  Primary   Vaccines up to date. Screenign labs checked today. Pap N/A. Mammo ordered. Colonoscopy up to date. Continue diet and exercise. Call with any concerns.        Follow up plan: Return in about 4 weeks (around 06/14/2023).  LABORATORY TESTING:  - Pap smear: not applicable  IMMUNIZATIONS:   - Tdap: Tetanus vaccination status reviewed: last tetanus booster within 10 years. - Influenza: Up to date - Pneumovax: Up to date - Prevnar: Not applicable - COVID: Refused - HPV: Not applicable - Shingrix vaccine: Up to date  SCREENING: -Mammogram: Up to date  - Colonoscopy: Up to date   PATIENT COUNSELING:   Advised to take 1 mg of folate supplement per day if capable of pregnancy.   Sexuality: Discussed sexually transmitted diseases, partner selection, use of condoms, avoidance of unintended pregnancy  and contraceptive alternatives.   Advised to avoid cigarette smoking.  I discussed with the patient that most people either abstain from alcohol or drink within safe limits (<=14/week and <=4 drinks/occasion for males, <=7/weeks and <= 3 drinks/occasion for females) and that the risk for alcohol disorders and other health effects rises proportionally with the number of drinks per week and how often a drinker exceeds  daily limits.  Discussed cessation/primary prevention of drug use and availability of treatment for abuse.   Diet: Encouraged to adjust caloric intake to maintain  or achieve ideal body weight, to reduce intake of dietary saturated fat and total fat, to limit sodium intake by avoiding high sodium foods and not adding table salt, and to maintain adequate dietary potassium and calcium preferably from fresh fruits, vegetables, and low-fat dairy products.    stressed the importance of regular exercise  Injury prevention: Discussed safety belts, safety helmets, smoke detector, smoking near bedding or upholstery.   Dental health: Discussed importance of regular tooth brushing, flossing, and dental visits.    NEXT PREVENTATIVE PHYSICAL DUE IN 1 YEAR. Return in about 4 weeks (around 06/14/2023).

## 2023-05-17 NOTE — Assessment & Plan Note (Addendum)
Not doing well. Will start breztri. Recheck 1 month. Call with any concerns.

## 2023-05-17 NOTE — Telephone Encounter (Signed)
Requested medication (s) are due for refill today: yes  Requested medication (s) are on the active medication list: yes  Last refill:  03/16/23 #60  Future visit scheduled: today  Notes to clinic:  overdue lab work   Requested Prescriptions  Pending Prescriptions Disp Refills   furosemide (LASIX) 20 MG tablet [Pharmacy Med Name: FUROSEMIDE 20 MG TABLET] 90 tablet 1    Sig: TAKE 1 TABLET BY MOUTH ONCE DAILY AS NEEDED FOR SWELLING     Cardiovascular:  Diuretics - Loop Failed - 05/14/2023  8:32 AM      Failed - K in normal range and within 180 days    Potassium  Date Value Ref Range Status  10/06/2022 4.1 3.5 - 5.2 mmol/L Final  08/30/2013 3.7 3.5 - 5.1 mmol/L Final         Failed - Ca in normal range and within 180 days    Calcium  Date Value Ref Range Status  10/06/2022 9.4 8.7 - 10.2 mg/dL Final   Calcium, Total  Date Value Ref Range Status  08/30/2013 9.1 8.5 - 10.1 mg/dL Final         Failed - Na in normal range and within 180 days    Sodium  Date Value Ref Range Status  10/06/2022 142 134 - 144 mmol/L Final  08/30/2013 136 136 - 145 mmol/L Final         Failed - Cr in normal range and within 180 days    Creatinine  Date Value Ref Range Status  08/30/2013 0.98 0.60 - 1.30 mg/dL Final   Creatinine, Ser  Date Value Ref Range Status  10/06/2022 0.87 0.57 - 1.00 mg/dL Final         Failed - Cl in normal range and within 180 days    Chloride  Date Value Ref Range Status  10/06/2022 105 96 - 106 mmol/L Final  08/30/2013 105 98 - 107 mmol/L Final         Failed - Mg Level in normal range and within 180 days    Magnesium  Date Value Ref Range Status  02/15/2013 1.9 mg/dL Final    Comment:    1.6-1.0 THERAPEUTIC RANGE: 4-7 mg/dL TOXIC: > 10 mg/dL  -----------------------          Failed - Last BP in normal range    BP Readings from Last 1 Encounters:  04/29/23 (!) 191/80         Passed - Valid encounter within last 6 months    Recent Outpatient  Visits           2 months ago Oropharyngeal dysphagia   [place] [facility] [place], Megan P, DO   5 months ago Acute pain of left knee   [place] [facility] [place], Megan P, DO   7 months ago Benign hypertensive renal disease   [place] [facility] [place], Megan P, DO   9 months ago Other osteoarthritis of spine, lumbar region   [place] [facility] [place], Megan P, DO   10 months ago Benign hypertensive renal disease   [place] [facility] [place], Oralia Rud, DO       Future Appointments             Today Dorcas Carrow, DO [facility]

## 2023-05-17 NOTE — Assessment & Plan Note (Signed)
Under good control on current regimen. Continue current regimen. Continue to monitor. Call with any concerns. Refills given. Labs drawn today.   

## 2023-05-17 NOTE — Assessment & Plan Note (Signed)
Reassured patient. Continue to monitor.  

## 2023-05-17 NOTE — Assessment & Plan Note (Signed)
Rechecking labs today. Await results. Treat as needed.  °

## 2023-05-17 NOTE — Patient Instructions (Signed)
Please call to schedule your mammogram and/or bone density: [facility]  Address: [address], [place], [place] [address] Phone: [phone]  [facility] [place] [address] [place],  [place]  [address] Phone: [phone]   

## 2023-05-17 NOTE — Assessment & Plan Note (Signed)
Will keep BP and cholesterol under good control. Will increase her imdur to 60mg  and recheck in 1 month.

## 2023-05-18 ENCOUNTER — Encounter: Payer: 59 | Admitting: Speech Pathology

## 2023-05-18 LAB — CBC WITH DIFFERENTIAL/PLATELET
Basophils Absolute: 0 10*3/uL (ref 0.0–0.2)
Basos: 1 %
EOS (ABSOLUTE): 0.2 10*3/uL (ref 0.0–0.4)
Eos: 3 %
Hematocrit: 38.2 % (ref 34.0–46.6)
Hemoglobin: 12.6 g/dL (ref 11.1–15.9)
Immature Grans (Abs): 0 10*3/uL (ref 0.0–0.1)
Immature Granulocytes: 0 %
Lymphocytes Absolute: 2.1 10*3/uL (ref 0.7–3.1)
Lymphs: 32 %
MCH: 29.4 pg (ref 26.6–33.0)
MCHC: 33 g/dL (ref 31.5–35.7)
MCV: 89 fL (ref 79–97)
Monocytes Absolute: 0.5 10*3/uL (ref 0.1–0.9)
Monocytes: 8 %
Neutrophils Absolute: 3.6 10*3/uL (ref 1.4–7.0)
Neutrophils: 56 %
Platelets: 212 10*3/uL (ref 150–450)
RBC: 4.28 x10E6/uL (ref 3.77–5.28)
RDW: 12.5 % (ref 11.7–15.4)
WBC: 6.5 10*3/uL (ref 3.4–10.8)

## 2023-05-18 LAB — COMPREHENSIVE METABOLIC PANEL
ALT: 12 [IU]/L (ref 0–32)
AST: 20 [IU]/L (ref 0–40)
Albumin: 4.3 g/dL (ref 3.8–4.9)
Alkaline Phosphatase: 67 [IU]/L (ref 44–121)
BUN/Creatinine Ratio: 26 (ref 12–28)
BUN: 20 mg/dL (ref 8–27)
Bilirubin Total: 0.4 mg/dL (ref 0.0–1.2)
CO2: 20 mmol/L (ref 20–29)
Calcium: 9.2 mg/dL (ref 8.7–10.3)
Chloride: 110 mmol/L — ABNORMAL HIGH (ref 96–106)
Creatinine, Ser: 0.77 mg/dL (ref 0.57–1.00)
Globulin, Total: 1.9 g/dL (ref 1.5–4.5)
Glucose: 103 mg/dL — ABNORMAL HIGH (ref 70–99)
Potassium: 4.4 mmol/L (ref 3.5–5.2)
Sodium: 144 mmol/L (ref 134–144)
Total Protein: 6.2 g/dL (ref 6.0–8.5)
eGFR: 88 mL/min/{1.73_m2} (ref >59)

## 2023-05-18 LAB — LIPID PANEL W/O CHOL/HDL RATIO
Cholesterol, Total: 148 mg/dL (ref 100–199)
HDL: 54 mg/dL (ref >39)
LDL Chol Calc (NIH): 70 mg/dL (ref 0–99)
Triglycerides: 139 mg/dL (ref 0–149)
VLDL Cholesterol Cal: 24 mg/dL (ref 5–40)

## 2023-05-18 LAB — TSH: TSH: 0.718 u[IU]/mL (ref 0.450–4.500)

## 2023-05-20 ENCOUNTER — Encounter: Payer: 59 | Admitting: Speech Pathology

## 2023-05-20 ENCOUNTER — Encounter: Payer: Self-pay | Admitting: Gastroenterology

## 2023-05-25 ENCOUNTER — Encounter: Payer: 59 | Admitting: Speech Pathology

## 2023-05-27 ENCOUNTER — Encounter: Payer: 59 | Admitting: Speech Pathology

## 2023-05-31 ENCOUNTER — Encounter: Payer: 59 | Admitting: Speech Pathology

## 2023-06-03 ENCOUNTER — Encounter: Payer: 59 | Admitting: Speech Pathology

## 2023-06-08 ENCOUNTER — Encounter: Payer: 59 | Admitting: Speech Pathology

## 2023-06-10 ENCOUNTER — Encounter: Payer: 59 | Admitting: Speech Pathology

## 2023-06-15 ENCOUNTER — Encounter: Payer: Self-pay | Admitting: Family Medicine

## 2023-06-15 ENCOUNTER — Ambulatory Visit (INDEPENDENT_AMBULATORY_CARE_PROVIDER_SITE_OTHER): Payer: 59 | Admitting: Family Medicine

## 2023-06-15 VITALS — BP 121/79 | HR 80 | Wt 238.6 lb

## 2023-06-15 DIAGNOSIS — I129 Hypertensive chronic kidney disease with stage 1 through stage 4 chronic kidney disease, or unspecified chronic kidney disease: Secondary | ICD-10-CM | POA: Diagnosis not present

## 2023-06-15 DIAGNOSIS — F332 Major depressive disorder, recurrent severe without psychotic features: Secondary | ICD-10-CM | POA: Diagnosis not present

## 2023-06-15 DIAGNOSIS — F419 Anxiety disorder, unspecified: Secondary | ICD-10-CM

## 2023-06-15 DIAGNOSIS — I25118 Atherosclerotic heart disease of native coronary artery with other forms of angina pectoris: Secondary | ICD-10-CM

## 2023-06-15 DIAGNOSIS — J449 Chronic obstructive pulmonary disease, unspecified: Secondary | ICD-10-CM | POA: Diagnosis not present

## 2023-06-15 MED ORDER — QUETIAPINE FUMARATE ER 50 MG PO TB24: 50.0000 mg | ORAL_TABLET | Freq: Every day | ORAL | 3 refills | Status: DC

## 2023-06-15 NOTE — Assessment & Plan Note (Signed)
 Not doing well. Did not tolerate vraylar. Will change her seroquel to XR and recheck 1 month. Call with any concerns.

## 2023-06-15 NOTE — Progress Notes (Signed)
 BP 121/79   Pulse 80   Wt 238 lb 9.6 oz (108.2 kg)   SpO2 96%   BMI 40.32 kg/m    Subjective:    Patient ID: Hannah Kaiser, female    DOB: [birth year], 63 y.o.   MRN: [id number]  HPI: Hannah Kaiser is a 63 y.o. female  Chief Complaint  Patient presents with   COPD   Anxiety    Patient says she has since stopped the Vraylar  prescription as she seem to have every side effect that was listed on the medication. Patient says she has restarted her Seroquel  prescription. Patient says she was overeating.    Hypertension   COPD COPD status: better Satisfied with current treatment?: yes Oxygen use: no Dyspnea frequency: constant Cough frequency: often Rescue inhaler frequency: 2x a day   Limitation of activity: no Productive cough: no Pneumovax: Up to Date Influenza: Up to Date  HYPERTENSION  Hypertension status: controlled  Satisfied with current treatment? yes Duration of hypertension: chronic BP monitoring frequency:  not checking BP medication side effects:  no Medication compliance: excellent compliance Previous BP meds:amlodipine , atenolol , lasix , imdur , losartan  Aspirin : yes Recurrent headaches: no Visual changes: no Palpitations: no Dyspnea: no Chest pain: no Lower extremity edema: no Dizzy/lightheaded: no  ANXIETY/DEPRESSION- did not do well with the vraylar . She notes that she was really nauseous and was overeating, she notes that she was not sleeping well- she was talking in her sleep a lot.  Duration: chronic Status:uncontrolled Anxious mood: yes  Excessive worrying: yes Irritability: yes  Sweating: no Nausea: no Palpitations:no Hyperventilation: no Panic attacks: no Agoraphobia: no  Obscessions/compulsions: no Depressed mood: yes    06/15/2023   11:31 AM 05/17/2023    1:07 PM 03/16/2023    2:18 PM 11/19/2022    8:49 AM 10/06/2022    1:20 PM  Depression screen PHQ 2/9  Decreased Interest 2 1 1 1  0  Down, Depressed, Hopeless 1 0 1 1 0  PHQ - 2 Score  3 1 2 2  0  Altered sleeping 3 2 2 1 1   Tired, decreased energy 3 3 2 1 1   Change in appetite 3 3 3 1 2   Feeling bad or failure about yourself  1 0 1 0 0  Trouble concentrating 1 0 1 1 2   Moving slowly or fidgety/restless 1 0 1 0 1  Suicidal thoughts 0 0 0 0 0  PHQ-9 Score 15 9 12 6 7   Difficult doing work/chores Somewhat difficult Somewhat difficult Somewhat difficult Somewhat difficult Somewhat difficult      06/15/2023   11:31 AM 05/17/2023    1:07 PM 03/16/2023    2:19 PM 11/19/2022    8:49 AM  GAD 7 : Generalized Anxiety Score  Nervous, Anxious, on Edge 2 1 1 1   Control/stop worrying 1 1 1 1   Worry too much - different things 1 1 1 1   Trouble relaxing 3 1 1 1   Restless 3 1 1 1   Easily annoyed or irritable 3 1 3 1   Afraid - awful might happen 1 1 1 1   Total GAD 7 Score 14 7 9 7   Anxiety Difficulty Somewhat difficult Somewhat difficult Somewhat difficult Somewhat difficult   Anhedonia: no Weight changes: no Insomnia: yes   Hypersomnia: no Fatigue/loss of energy: yes Feelings of worthlessness: no Feelings of guilt: no Impaired concentration/indecisiveness: no Suicidal ideations: no  Crying spells: yes Recent Stressors/Life Changes: yes   Relationship problems: no   Family stress: yes  Financial stress: yes    Job stress: no    Recent death/loss: yes  Relevant past medical, surgical, family and social history reviewed and updated as indicated. Interim medical history since our last visit reviewed. Allergies and medications reviewed and updated.  Review of Systems  Constitutional: Negative.   Respiratory: Negative.    Cardiovascular: Negative.   Gastrointestinal: Negative.   Musculoskeletal: Negative.   Skin: Negative.   Neurological: Negative.   Psychiatric/Behavioral:  Positive for dysphoric mood. Negative for agitation, behavioral problems, confusion, decreased concentration, hallucinations, self-injury, sleep disturbance and suicidal ideas. The patient is  nervous/anxious. The patient is not hyperactive.     Per HPI unless specifically indicated above     Objective:    BP 121/79   Pulse 80   Wt 238 lb 9.6 oz (108.2 kg)   SpO2 96%   BMI 40.32 kg/m   Wt Readings from Last 3 Encounters:  06/15/23 238 lb 9.6 oz (108.2 kg)  05/17/23 235 lb 6.4 oz (106.8 kg)  04/29/23 238 lb (108 kg)    Physical Exam Vitals and nursing note reviewed.  Constitutional:      General: She is not in acute distress.    Appearance: Normal appearance. She is obese. She is not ill-appearing, toxic-appearing or diaphoretic.  HENT:     Head: Normocephalic and atraumatic.     Right Ear: External ear normal.     Left Ear: External ear normal.     Nose: Nose normal.     Mouth/Throat:     Mouth: Mucous membranes are moist.     Pharynx: Oropharynx is clear.  Eyes:     General: No scleral icterus.       Right eye: No discharge.        Left eye: No discharge.     Extraocular Movements: Extraocular movements intact.     Conjunctiva/sclera: Conjunctivae normal.     Pupils: Pupils are equal, round, and reactive to light.  Cardiovascular:     Rate and Rhythm: Normal rate and regular rhythm.     Pulses: Normal pulses.     Heart sounds: Normal heart sounds. No murmur heard.    No friction rub. No gallop.  Pulmonary:     Effort: Pulmonary effort is normal. No respiratory distress.     Breath sounds: Normal breath sounds. No stridor. No wheezing, rhonchi or rales.  Chest:     Chest wall: No tenderness.  Musculoskeletal:        General: Normal range of motion.     Cervical back: Normal range of motion and neck supple.  Skin:    General: Skin is warm and dry.     Capillary Refill: Capillary refill takes less than 2 seconds.     Coloration: Skin is not jaundiced or pale.     Findings: No bruising, erythema, lesion or rash.  Neurological:     General: No focal deficit present.     Mental Status: She is alert and oriented to person, place, and time. Mental status  is at baseline.  Psychiatric:        Mood and Affect: Mood normal.        Behavior: Behavior normal.        Thought Content: Thought content normal.        Judgment: Judgment normal.     Results for orders placed or performed in visit on 05/17/23  Microalbumin, Urine Waived   Collection Time: 05/17/23  1:29 PM  Result Value Ref  Range   Microalb, Ur Waived 30 (H) 0 - 19 mg/L   Creatinine, Urine Waived 200 10 - 300 mg/dL   Microalb/Creat Ratio <30 <30 mg/g  Bayer DCA Hb A1c Waived   Collection Time: 05/17/23  1:29 PM  Result Value Ref Range   HB A1C (BAYER DCA - WAIVED) 5.7 (H) 4.8 - 5.6 %  CBC with Differential/Platelet   Collection Time: 05/17/23  1:31 PM  Result Value Ref Range   WBC 6.5 3.4 - 10.8 x10E3/uL   RBC 4.28 3.77 - 5.28 x10E6/uL   Hemoglobin 12.6 11.1 - 15.9 g/dL   Hematocrit 61.7 65.9 - 46.6 %   MCV 89 79 - 97 fL   MCH 29.4 26.6 - 33.0 pg   MCHC 33.0 31.5 - 35.7 g/dL   RDW 87.4 88.2 - 84.5 %   Platelets 212 150 - 450 x10E3/uL   Neutrophils 56 Not Estab. %   Lymphs 32 Not Estab. %   Monocytes 8 Not Estab. %   Eos 3 Not Estab. %   Basos 1 Not Estab. %   Neutrophils Absolute 3.6 1.4 - 7.0 x10E3/uL   Lymphocytes Absolute 2.1 0.7 - 3.1 x10E3/uL   Monocytes Absolute 0.5 0.1 - 0.9 x10E3/uL   EOS (ABSOLUTE) 0.2 0.0 - 0.4 x10E3/uL   Basophils Absolute 0.0 0.0 - 0.2 x10E3/uL   Immature Granulocytes 0 Not Estab. %   Immature Grans (Abs) 0.0 0.0 - 0.1 x10E3/uL  Comprehensive metabolic panel   Collection Time: 05/17/23  1:31 PM  Result Value Ref Range   Glucose 103 (H) 70 - 99 mg/dL   BUN 20 8 - 27 mg/dL   Creatinine, Ser 9.22 0.57 - 1.00 mg/dL   eGFR 88 >40 fO/fpw/8.26   BUN/Creatinine Ratio 26 12 - 28   Sodium 144 134 - 144 mmol/L   Potassium 4.4 3.5 - 5.2 mmol/L   Chloride 110 (H) 96 - 106 mmol/L   CO2 20 20 - 29 mmol/L   Calcium  9.2 8.7 - 10.3 mg/dL   Total Protein 6.2 6.0 - 8.5 g/dL   Albumin 4.3 3.8 - 4.9 g/dL   Globulin, Total 1.9 1.5 - 4.5 g/dL    Bilirubin Total 0.4 0.0 - 1.2 mg/dL   Alkaline Phosphatase 67 44 - 121 IU/L   AST 20 0 - 40 IU/L   ALT 12 0 - 32 IU/L  Lipid Panel w/o Chol/HDL Ratio   Collection Time: 05/17/23  1:31 PM  Result Value Ref Range   Cholesterol, Total 148 100 - 199 mg/dL   Triglycerides 860 0 - 149 mg/dL   HDL 54 >60 mg/dL   VLDL Cholesterol Cal 24 5 - 40 mg/dL   LDL Chol Calc (NIH) 70 0 - 99 mg/dL  TSH   Collection Time: 05/17/23  1:31 PM  Result Value Ref Range   TSH 0.718 0.450 - 4.500 uIU/mL      Assessment & Plan:   Problem List Items Addressed This Visit       Cardiovascular and Mediastinum   CAD (coronary artery disease)   Has not had a catheterization in 6.5 years as she has been appropriately medically managed. She is very anxious about this and would like to go back to her previous cardiologist. Referral placed. Call with any concerns.       Relevant Orders   Ambulatory referral to Cardiology     Respiratory   COPD (chronic obstructive pulmonary disease) (HCC)   Under OK control on current regimen. Continue  current regimen. Continue to monitor. Call with any concerns. Refills given.          Genitourinary   Benign hypertensive renal disease - Primary   Under good control on current regimen. Continue current regimen. Continue to monitor. Call with any concerns. Refills up to date. Labs drawn today.        Relevant Orders   Basic metabolic panel     Other   Severe episode of recurrent major depressive disorder, without psychotic features (HCC)   Not doing well. Did not tolerate vraylar . Will change her seroquel  to XR and recheck 1 month. Call with any concerns.       Anxiety   Not doing well. Did not tolerate vraylar . Will change her seroquel  to XR and recheck 1 month. Call with any concerns.       Morbid obesity (HCC)   Encouraged diet and exercise with goal of losing 1-2lbs per week. Call with any concerns.         Follow up plan: Return in about 4 weeks (around  07/13/2023).

## 2023-06-15 NOTE — Assessment & Plan Note (Signed)
 Encouraged diet and exercise with goal of losing 1-2lbs per week. Call with any concerns.

## 2023-06-15 NOTE — Assessment & Plan Note (Addendum)
 Under OK control on current regimen. Continue current regimen. Continue to monitor. Call with any concerns. Refills given.

## 2023-06-15 NOTE — Assessment & Plan Note (Signed)
 Under good control on current regimen. Continue current regimen. Continue to monitor. Call with any concerns. Refills up to date. Labs drawn today.

## 2023-06-15 NOTE — Assessment & Plan Note (Signed)
 Has not had a catheterization in 6.5 years as she has been appropriately medically managed. She is very anxious about this and would like to go back to her previous cardiologist. Referral placed. Call with any concerns.

## 2023-06-16 LAB — BASIC METABOLIC PANEL
BUN/Creatinine Ratio: 19 (ref 12–28)
BUN: 16 mg/dL (ref 8–27)
CO2: 21 mmol/L (ref 20–29)
Calcium: 9.1 mg/dL (ref 8.7–10.3)
Chloride: 106 mmol/L (ref 96–106)
Creatinine, Ser: 0.85 mg/dL (ref 0.57–1.00)
Glucose: 92 mg/dL (ref 70–99)
Potassium: 4 mmol/L (ref 3.5–5.2)
Sodium: 141 mmol/L (ref 134–144)
eGFR: 78 mL/min/{1.73_m2} (ref >59)

## 2023-06-21 ENCOUNTER — Encounter: Payer: Self-pay | Admitting: Oncology

## 2023-06-28 ENCOUNTER — Encounter: Payer: Self-pay | Admitting: Cardiovascular Disease

## 2023-06-28 ENCOUNTER — Ambulatory Visit: Payer: Self-pay | Admitting: Cardiovascular Disease

## 2023-06-28 VITALS — BP 137/86 | HR 60 | Ht 65.0 in | Wt 240.8 lb

## 2023-06-28 DIAGNOSIS — I25112 Atherosclerotic heart disease of native coronary artery with refractory angina pectoris: Secondary | ICD-10-CM

## 2023-06-28 DIAGNOSIS — R9431 Abnormal electrocardiogram [ECG] [EKG]: Secondary | ICD-10-CM

## 2023-06-28 DIAGNOSIS — E782 Mixed hyperlipidemia: Secondary | ICD-10-CM

## 2023-06-28 DIAGNOSIS — R0789 Other chest pain: Secondary | ICD-10-CM

## 2023-06-28 DIAGNOSIS — R0602 Shortness of breath: Secondary | ICD-10-CM

## 2023-06-28 MED ORDER — RANOLAZINE ER 500 MG PO TB12: 500.0000 mg | ORAL_TABLET | Freq: Two times a day (BID) | ORAL | 2 refills | Status: DC

## 2023-06-28 MED ORDER — ASPIRIN 81 MG PO TBEC
81.0000 mg | DELAYED_RELEASE_TABLET | Freq: Every day | ORAL | 12 refills | Status: DC
Start: 2023-06-28 — End: 2024-03-09

## 2023-06-28 NOTE — Progress Notes (Signed)
Cardiology Office Note   Date:  06/28/2023   ID:  Hannah Kaiser, DOB 02-Jun-1961, MRN [id number]  PCP:  Dorcas Carrow, DO  Cardiologist:  Adrian Blackwater, MD      History of Present Illness: Hannah Kaiser is a 63 y.o. female who presents for  Chief Complaint  Patient presents with   Consult    Cardiac consult- Coronary Artery Disease    This is a 63 year old white female who was seen by me in the past and had a cardiac catheterization in 2018 but due to insurance issues was followed by [facility] health medical group Dr. Mariah Milling.  Hannah Kaiser had a nuclear stress test few months back which showed no evidence of ischemia and was seen by [facility] health medical group but was not satisfied thus came back for evaluation.  Hannah Kaiser was started on isosorbide 60 mg and continues to have chest pain.  Recall Hannah Kaiser had 40% mid RCA and 40% left circumflex disease on cardiac catheterization done in 2018.  Nuclear stress test recently was unremarkable however.  Hannah Kaiser may need CCTA or cardiac catheterization if continues to have chest pain.  Will add Ranexa 500 p.o. twice daily.  Hannah Kaiser cannot have that done yet because of insurance issues.  Chest Pain  This is a recurrent problem. The current episode started 1 to 4 weeks ago. The onset quality is sudden. The problem has been waxing and waning. The pain is at a severity of 5/10. The quality of the pain is described as tightness. The pain radiates to the left jaw. Associated symptoms include diaphoresis, dizziness, exertional chest pressure and shortness of breath. The pain is aggravated by exertion. Hannah Kaiser has tried nitroglycerin for the symptoms. The treatment provided mild relief.      Past Medical History:  Diagnosis Date   Anemia    Anxiety    Arthritis    knees, thoracic spurs and arthritis   Asthma    Breast pain    CHF (congestive heart failure) (HCC)    Colon polyps    COPD (chronic obstructive pulmonary disease) (HCC)    Depression    Dysrhythmia    GERD  (gastroesophageal reflux disease)    Glaucoma    Heart murmur    History of blood transfusion    Hypertension    Motion sickness    cars   Multilevel degenerative disc disease    Neuropathy    Bilateral arms and legs   Nipple discharge    Numbness of both lower extremities    bulging lumbar discs - per pt   Numbness of upper extremity    bilateral - degenerative cervical discs - per pt   Pneumonia    PONV (postoperative nausea and vomiting)    Sleep apnea    has CPAP   Smokers' cough (HCC)    Swelling of limb 03/11/2021   Wears dentures    full upper and lower     Past Surgical History:  Procedure Laterality Date   ABDOMINAL HYSTERECTOMY     BACK SURGERY     BELPHAROPTOSIS REPAIR     BROW LIFT Bilateral 03/24/2016   Procedure: BLEPHAROPLASTY;  Surgeon: Imagene Riches, MD;  Location: [facility];  Service: Ophthalmology;  Laterality: Bilateral;  sleep apnea   CARDIAC CATHETERIZATION     COLONOSCOPY WITH PROPOFOL N/A 10/19/2016   Procedure: COLONOSCOPY WITH PROPOFOL;  Surgeon: Midge Minium, MD;  Location: [facility];  Service: Endoscopy;  Laterality: N/A;  COLONOSCOPY WITH PROPOFOL N/A 05/12/2022   Procedure: COLONOSCOPY WITH PROPOFOL;  Surgeon: Midge Minium, MD;  Location: [facility] ENDOSCOPY;  Service: Endoscopy;  Laterality: N/A;   DILATION AND CURETTAGE OF UTERUS     ECTOPIC PREGNANCY SURGERY     x2   ESOPHAGOGASTRODUODENOSCOPY (EGD) WITH PROPOFOL N/A 10/19/2016   Procedure: ESOPHAGOGASTRODUODENOSCOPY (EGD) WITH PROPOFOL;  Surgeon: Midge Minium, MD;  Location: [facility];  Service: Endoscopy;  Laterality: N/A;   LEFT HEART CATH AND CORONARY ANGIOGRAPHY Left 12/29/2016   Procedure: Left Heart Cath and Coronary Angiography;  Surgeon: Laurier Nancy, MD;  Location: [facility] INVASIVE CV LAB;  Service: Cardiovascular;  Laterality: Left;   NECK SURGERY  2003   POLYPECTOMY  10/19/2016   Procedure: POLYPECTOMY;  Surgeon: Midge Minium, MD;  Location: [facility]  SURGERY [facility];  Service: Endoscopy;;   SPINE SURGERY  1989, 1998   TOTAL ABDOMINAL HYSTERECTOMY W/ BILATERAL SALPINGOOPHORECTOMY  2014   TUBAL LIGATION       Current Outpatient Medications  Medication Sig Dispense Refill   albuterol (VENTOLIN HFA) 108 (90 Base) MCG/ACT inhaler Inhale 1-2 puffs into the lungs every 6 (six) hours as needed for wheezing or shortness of breath. 18 each 1   amLODipine (NORVASC) 10 MG tablet Take 1 tablet (10 mg total) by mouth daily. 90 tablet 1   aspirin EC 81 MG tablet Take 1 tablet (81 mg total) by mouth daily. Swallow whole. 30 tablet 12   atenolol (TENORMIN) 50 MG tablet Take 1 tablet (50 mg total) by mouth daily. 90 tablet 1   Budeson-Glycopyrrol-Formoterol (BREZTRI AEROSPHERE) 160-9-4.8 MCG/ACT AERO Inhale 2 puffs into the lungs 2 (two) times daily. 10.7 g 11   busPIRone (BUSPAR) 15 MG tablet Take 1 tablet (15 mg total) by mouth 3 (three) times daily. 270 tablet 1   clonazePAM (KLONOPIN) 1 MG tablet Take 1 tablet (1 mg total) by mouth 2 (two) times daily as needed. for anxiety 30 tablet 0   ezetimibe (ZETIA) 10 MG tablet Take 1 tablet (10 mg total) by mouth daily. 90 tablet 1   FLUoxetine (PROZAC) 40 MG capsule Take 2 capsules (80 mg total) by mouth daily. 180 capsule 1   furosemide (LASIX) 20 MG tablet TAKE 1 TABLET BY MOUTH ONCE DAILY AS NEEDED FOR SWELLING 60 tablet 3   hydrOXYzine (ATARAX) 25 MG tablet Take 1 tablet (25 mg total) by mouth 3 (three) times daily as needed. 270 tablet 1   ibuprofen (ADVIL) 800 MG tablet Take 1 tablet (800 mg total) by mouth every 8 (eight) hours as needed. 270 tablet 1   isosorbide mononitrate (IMDUR) 60 MG 24 hr tablet Take 1 tablet (60 mg total) by mouth daily. 90 tablet 1   latanoprost (XALATAN) 0.005 % ophthalmic solution Place 1 drop into both eyes at bedtime.     losartan (COZAAR) 100 MG tablet Take 1 tablet (100 mg total) by mouth daily. 90 tablet 1   mupirocin ointment (BACTROBAN) 2 % Apply 1 application. topically  2 (two) times daily. 22 g 0   nystatin-triamcinolone ointment (MYCOLOG) Apply 1 application topically 2 (two) times daily. 30 g 1   ondansetron (ZOFRAN) 4 MG tablet Take 1 tablet (4 mg total) by mouth every 8 (eight) hours as needed for nausea or vomiting. 60 tablet 3   pantoprazole (PROTONIX) 40 MG tablet Take 1 tablet (40 mg total) by mouth 2 (two) times daily. 180 tablet 1   pregabalin (LYRICA) 25 MG capsule Take 25 mg by mouth 2 (  two) times daily.     QUEtiapine (SEROQUEL XR) 50 MG TB24 24 hr tablet Take 1 tablet (50 mg total) by mouth at bedtime. 30 tablet 3   ranolazine (RANEXA) 500 MG 12 hr tablet Take 1 tablet (500 mg total) by mouth 2 (two) times daily. 60 tablet 2   rosuvastatin (CRESTOR) 10 MG tablet Take 1 tablet (10 mg total) by mouth every other day. 90 tablet 3   tiZANidine (ZANAFLEX) 4 MG tablet Take 1 tablet (4 mg total) by mouth every 8 (eight) hours as needed for muscle spasms. 90 tablet 1   triamcinolone ointment (KENALOG) 0.5 % Apply 1 application topically 2 (two) times daily. 30 g 0   valACYclovir (VALTREX) 1000 MG tablet Take 1 tablet (1,000 mg total) by mouth daily. 180 tablet 1   Vitamin D, Ergocalciferol, (DRISDOL) 1.25 MG (50000 UNIT) CAPS capsule Take 50,000 Units by mouth once a week.     No current facility-administered medications for this visit.    Allergies:   Abilify [aripiprazole], Ace inhibitors, Wellbutrin [bupropion], Paxlovid [nirmatrelvir-ritonavir], Azithromycin, and Erythromycin    Social History:   reports that Hannah Kaiser has been smoking cigarettes. Hannah Kaiser has a 61.5 pack-year smoking history. Hannah Kaiser has never used smokeless tobacco. Hannah Kaiser reports that Hannah Kaiser does not drink alcohol and does not use drugs.   Family History:  family history includes Cancer in her brother, maternal grandfather, and maternal uncle.    ROS:     Review of Systems  Constitutional:  Positive for diaphoresis.  HENT: Negative.    Eyes: Negative.   Respiratory:  Positive for shortness of  breath.   Cardiovascular:  Positive for chest pain.  Gastrointestinal: Negative.   Genitourinary: Negative.   Musculoskeletal: Negative.   Skin: Negative.   Neurological:  Positive for dizziness.  Endo/Heme/Allergies: Negative.   Psychiatric/Behavioral: Negative.    All other systems reviewed and are negative.     All other systems are reviewed and negative.    PHYSICAL EXAM: VS:  BP 137/86   Pulse 60   Ht 5\' 5"  (1.651 m)   Wt 240 lb 12.8 oz (109.2 kg)   SpO2 97%   BMI 40.07 kg/m  , BMI Body mass index is 40.07 kg/m. Last weight:  Wt Readings from Last 3 Encounters:  06/28/23 240 lb 12.8 oz (109.2 kg)  06/15/23 238 lb 9.6 oz (108.2 kg)  05/17/23 235 lb 6.4 oz (106.8 kg)     Physical Exam Constitutional:      Appearance: Normal appearance.  Cardiovascular:     Rate and Rhythm: Normal rate and regular rhythm.     Heart sounds: Normal heart sounds.  Pulmonary:     Effort: Pulmonary effort is normal.     Breath sounds: Normal breath sounds.  Musculoskeletal:     Right lower leg: No edema.     Left lower leg: No edema.  Neurological:     Mental Status: Hannah Kaiser is alert.       EKG: Normal normal sinus rhythm 60 bpm with T wave inversion in V1 poor R wave progression with low voltage  Recent Labs: 05/17/2023: ALT 12; Hemoglobin 12.6; Platelets 212; TSH 0.718 06/15/2023: BUN 16; Creatinine, Ser 0.85; Potassium 4.0; Sodium 141    Lipid Panel    Component Value Date/Time   CHOL 148 05/17/2023 1331   TRIG 139 05/17/2023 1331   HDL 54 05/17/2023 1331   CHOLHDL 4.7 (H) 08/30/2020 0904   LDLCALC 70 05/17/2023 1331  Other studies Reviewed: Additional studies/ records that were reviewed today include:  Review of the above records demonstrates:       No data to display            ASSESSMENT AND PLAN:    ICD-10-CM   1. Other chest pain  R07.89 ranolazine (RANEXA) 500 MG 12 hr tablet    aspirin EC 81 MG tablet   Patient had a nuclear stress test by  [facility] health medical group Dr. Mariah Milling which showed no evidence of ischemia with normal ejection fraction.  Add Ranexa    2. 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  Nuclear stress test by Dr. Mariah Milling was negative. CCTA needed    3. SOB (shortness of breath)  R06.02 ranolazine (RANEXA) 500 MG 12 hr tablet    aspirin EC 81 MG tablet   Patient continues to have shortness of breath thus will be evaluated by doing echocardiogram.    4. Mixed hyperlipidemia  E78.2 ranolazine (RANEXA) 500 MG 12 hr tablet    aspirin EC 81 MG tablet    5. Abnormal EKG  R94.31    May need CCTA once insurance issue is resolved.       Problem List Items Addressed This Visit       Cardiovascular and Mediastinum   CAD (coronary artery disease)   Relevant Medications   ranolazine (RANEXA) 500 MG 12 hr tablet   aspirin EC 81 MG tablet     Other   Hyperlipidemia   Relevant Medications   ranolazine (RANEXA) 500 MG 12 hr tablet   aspirin EC 81 MG tablet   Other Visit Diagnoses       Other chest pain    -  Primary   Patient had a nuclear stress test by [facility] health medical group Dr. Mariah Milling which showed no evidence of ischemia with normal ejection fraction.  Add Ranexa   Relevant Medications   ranolazine (RANEXA) 500 MG 12 hr tablet   aspirin EC 81 MG tablet     SOB (shortness of breath)       Patient continues to have shortness of breath thus will be evaluated by doing echocardiogram.   Relevant Medications   ranolazine (RANEXA) 500 MG 12 hr tablet   aspirin EC 81 MG tablet     Abnormal EKG       May need CCTA once insurance issue is resolved.          Disposition:   Return in about 4 weeks (around 07/26/2023).    Total time spent: 50   minutes  Signed,  Adrian Blackwater, MD  06/28/2023 2:57 PM    [facility]

## 2023-06-29 ENCOUNTER — Encounter: Payer: Self-pay | Admitting: Cardiovascular Disease

## 2023-07-10 ENCOUNTER — Other Ambulatory Visit: Payer: Self-pay | Admitting: Family Medicine

## 2023-07-12 NOTE — Telephone Encounter (Signed)
Requested medications are due for refill today.  no  Requested medications are on the active medications list.  yes  Last refill. 06/15/2023 #30 3 rf  Future visit scheduled.   yes  Notes to clinic.  Pt is requesting a 90 day supply.    Requested Prescriptions  Pending Prescriptions Disp Refills   QUEtiapine (SEROQUEL XR) 50 MG TB24 24 hr tablet [Pharmacy Med Name: QUETIAPINE ER 50 MG TABLET] 90 tablet 2    Sig: TAKE 1 TABLET BY MOUTH EVERYDAY AT BEDTIME     Not Delegated - Psychiatry:  Antipsychotics - Second Generation (Atypical) - quetiapine Failed - 07/12/2023  3:22 PM      Failed - This refill cannot be delegated      Failed - Lipid Panel in normal range within the last 12 months    Cholesterol, Total  Date Value Ref Range Status  05/17/2023 148 100 - 199 mg/dL Final   LDL Chol Calc (NIH)  Date Value Ref Range Status  05/17/2023 70 0 - 99 mg/dL Final   HDL  Date Value Ref Range Status  05/17/2023 54 >39 mg/dL Final   Triglycerides  Date Value Ref Range Status  05/17/2023 139 0 - 149 mg/dL Final         Passed - TSH in normal range and within 360 days    TSH  Date Value Ref Range Status  05/17/2023 0.718 0.450 - 4.500 uIU/mL Final         Passed - Completed PHQ-2 or PHQ-9 in the last 360 days      Passed - Last BP in normal range    BP Readings from Last 1 Encounters:  06/28/23 137/86         Passed - Last Heart Rate in normal range    Pulse Readings from Last 1 Encounters:  06/28/23 60         Passed - Valid encounter within last 6 months    Recent Outpatient Visits           3 weeks ago Benign hypertensive renal disease   [place] [facility] [place], Megan P, DO   1 month ago Routine general medical examination at a health care facility   [facility] [place], [place] P, DO   3 months ago Oropharyngeal dysphagia   [place] [facility] [place], Megan P, DO   7 months ago Acute pain of left knee    [place] [facility] [place], Megan P, DO   9 months ago Benign hypertensive renal disease   [place] [facility] [place], Oralia Rud, DO       Future Appointments             Tomorrow Dorcas Carrow, DO [facility]   In 2 weeks Scoggins, Hospital doctor, NP [facility]            Passed - CBC within normal limits and completed in the last 12 months    WBC  Date Value Ref Range Status  05/17/2023 6.5 3.4 - 10.8 x10E3/uL Final  04/19/2023 6.8 4.0 - 10.5 K/uL Final   RBC  Date Value Ref Range Status  05/17/2023 4.28 3.77 - 5.28 x10E6/uL Final  04/19/2023 4.15 3.87 - 5.11 MIL/uL Final   Hemoglobin  Date Value Ref Range Status  05/17/2023 12.6 11.1 - 15.9 g/dL Final   Hematocrit  Date Value Ref Range Status  05/17/2023 38.2  34.0 - 46.6 % Final   MCHC  Date Value Ref Range Status  05/17/2023 33.0 31.5 - 35.7 g/dL Final  86/57/8469 62.9 30.0 - 36.0 g/dL Final   [facility]  Date Value Ref Range Status  05/17/2023 29.4 26.6 - 33.0 pg Final  04/19/2023 29.6 26.0 - 34.0 pg Final   MCV  Date Value Ref Range Status  05/17/2023 89 79 - 97 fL Final  08/30/2013 90 80 - 100 fL Final   No results found for: "PLTCOUNTKUC", "LABPLAT", "POCPLA" RDW  Date Value Ref Range Status  05/17/2023 12.5 11.7 - 15.4 % Final  08/30/2013 12.7 11.5 - 14.5 % Final         Passed - CMP within normal limits and completed in the last 12 months    Albumin  Date Value Ref Range Status  05/17/2023 4.3 3.8 - 4.9 g/dL Final  52/84/1324 4.2 3.4 - 5.0 g/dL Final   Alkaline Phosphatase  Date Value Ref Range Status  05/17/2023 67 44 - 121 IU/L Final  08/30/2013 53 Unit/L Final    Comment:    45-117 NOTE: New Reference Range 04/28/13    ALT  Date Value Ref Range Status  05/17/2023 12 0 - 32 IU/L Final   SGPT (ALT)  Date Value Ref Range Status  08/30/2013 21 12 - 78 U/L Final   AST  Date Value Ref Range Status   05/17/2023 20 0 - 40 IU/L Final   SGOT(AST)  Date Value Ref Range Status  08/30/2013 14 (L) 15 - 37 Unit/L Final   BUN  Date Value Ref Range Status  06/15/2023 16 8 - 27 mg/dL Final  40/03/2724 14 7 - 18 mg/dL Final   Calcium  Date Value Ref Range Status  06/15/2023 9.1 8.7 - 10.3 mg/dL Final   Calcium, Total  Date Value Ref Range Status  08/30/2013 9.1 8.5 - 10.1 mg/dL Final   CO2  Date Value Ref Range Status  06/15/2023 21 20 - 29 mmol/L Final   Co2  Date Value Ref Range Status  08/30/2013 27 21 - 32 mmol/L Final   Creatinine  Date Value Ref Range Status  08/30/2013 0.98 0.60 - 1.30 mg/dL Final   Creatinine, Ser  Date Value Ref Range Status  06/15/2023 0.85 0.57 - 1.00 mg/dL Final   Glucose  Date Value Ref Range Status  06/15/2023 92 70 - 99 mg/dL Final  36/64/4034 89 65 - 99 mg/dL Final   Glucose, Bld  Date Value Ref Range Status  01/14/2017 95 65 - 99 mg/dL Final   Potassium  Date Value Ref Range Status  06/15/2023 4.0 3.5 - 5.2 mmol/L Final  08/30/2013 3.7 3.5 - 5.1 mmol/L Final   Sodium  Date Value Ref Range Status  06/15/2023 141 134 - 144 mmol/L Final  08/30/2013 136 136 - 145 mmol/L Final   Bilirubin,Total  Date Value Ref Range Status  08/30/2013 0.5 0.2 - 1.0 mg/dL Final   Bilirubin Total  Date Value Ref Range Status  05/17/2023 0.4 0.0 - 1.2 mg/dL Final   Bilirubin, Direct  Date Value Ref Range Status  08/30/2013 < 0.1 0.00 - 0.20 mg/dL Final   Protein, ur  Date Value Ref Range Status  06/19/2015 NEGATIVE NEGATIVE mg/dL Final   Protein,UA  Date Value Ref Range Status  08/18/2022 Negative Negative/Trace Final   Total Protein  Date Value Ref Range Status  05/17/2023 6.2 6.0 - 8.5 g/dL Final  74/25/9563 7.5 6.4 - 8.2 g/dL Final  EGFR (African American)  Date Value Ref Range Status  08/30/2013 >60  Final   GFR calc Af Amer  Date Value Ref Range Status  03/18/2018 61 >59 mL/min/1.73 Final   eGFR  Date Value Ref Range  Status  06/15/2023 78 >59 mL/min/1.73 Final   EGFR (Non-African Amer.)  Date Value Ref Range Status  08/30/2013 >60  Final    Comment:    eGFR values <75mL/min/1.73 m2 may be an indication of chronic kidney disease (CKD). Calculated eGFR is useful in patients with stable renal function. The eGFR calculation will not be reliable in acutely ill patients when serum creatinine is changing rapidly. It is not useful in  patients on dialysis. The eGFR calculation may not be applicable to patients at the low and high extremes of body sizes, pregnant women, and vegetarians.    GFR calc non Af Amer  Date Value Ref Range Status  03/18/2018 53 (L) >59 mL/min/1.73 Final

## 2023-07-13 ENCOUNTER — Ambulatory Visit: Payer: 59 | Admitting: Family Medicine

## 2023-07-22 ENCOUNTER — Other Ambulatory Visit: Payer: Self-pay | Admitting: Family Medicine

## 2023-07-23 NOTE — Telephone Encounter (Signed)
Requested Prescriptions  Pending Prescriptions Disp Refills   ibuprofen (ADVIL) 800 MG tablet [Pharmacy Med Name: IBUPROFEN 800 MG TABLET] 90 tablet 0    Sig: TAKE 1 TABLET BY MOUTH EVERY 8 HOURS AS NEEDED     Analgesics:  NSAIDS Failed - 07/23/2023  2:55 PM      Failed - Manual Review: Labs are only required if the patient has taken medication for more than 8 weeks.      Passed - Cr in normal range and within 360 days    Creatinine  Date Value Ref Range Status  08/30/2013 0.98 0.60 - 1.30 mg/dL Final   Creatinine, Ser  Date Value Ref Range Status  06/15/2023 0.85 0.57 - 1.00 mg/dL Final         Passed - HGB in normal range and within 360 days    Hemoglobin  Date Value Ref Range Status  05/17/2023 12.6 11.1 - 15.9 g/dL Final         Passed - PLT in normal range and within 360 days    Platelets  Date Value Ref Range Status  05/17/2023 212 150 - 450 x10E3/uL Final         Passed - HCT in normal range and within 360 days    Hematocrit  Date Value Ref Range Status  05/17/2023 38.2 34.0 - 46.6 % Final         Passed - eGFR is 30 or above and within 360 days    EGFR (African American)  Date Value Ref Range Status  08/30/2013 >60  Final   GFR calc Af Amer  Date Value Ref Range Status  03/18/2018 61 >59 mL/min/1.73 Final   EGFR (Non-African Amer.)  Date Value Ref Range Status  08/30/2013 >60  Final    Comment:    eGFR values <24mL/min/1.73 m2 may be an indication of chronic kidney disease (CKD). Calculated eGFR is useful in patients with stable renal function. The eGFR calculation will not be reliable in acutely ill patients when serum creatinine is changing rapidly. It is not useful in  patients on dialysis. The eGFR calculation may not be applicable to patients at the low and high extremes of body sizes, pregnant women, and vegetarians.    GFR calc non Af Amer  Date Value Ref Range Status  03/18/2018 53 (L) >59 mL/min/1.73 Final   eGFR  Date Value Ref Range  Status  06/15/2023 78 >59 mL/min/1.73 Final         Passed - Patient is not pregnant      Passed - Valid encounter within last 12 months    Recent Outpatient Visits           1 month ago Benign hypertensive renal disease   [place] [facility] [place], Megan P, DO   2 months ago Routine general medical examination at a health care facility   [facility], [place] P, DO   4 months ago Oropharyngeal dysphagia   [facility] [place], Megan P, DO   8 months ago Acute pain of left knee   [place] [facility] [place], Megan P, DO   9 months ago Benign hypertensive renal disease   [place] [facility] [place], Oralia Rud, DO       Future Appointments             In 3 days Scoggins, Hospital doctor, NP [employer]

## 2023-07-26 ENCOUNTER — Ambulatory Visit: Payer: 59 | Admitting: Cardiology

## 2023-08-17 ENCOUNTER — Other Ambulatory Visit: Payer: Self-pay | Admitting: *Deleted

## 2023-08-17 ENCOUNTER — Ambulatory Visit: Payer: 59 | Admitting: Cardiovascular Disease

## 2023-08-17 DIAGNOSIS — D509 Iron deficiency anemia, unspecified: Secondary | ICD-10-CM

## 2023-08-18 ENCOUNTER — Inpatient Hospital Stay: Payer: 59 | Attending: Oncology

## 2023-08-19 ENCOUNTER — Inpatient Hospital Stay: Payer: 59

## 2023-08-19 ENCOUNTER — Inpatient Hospital Stay: Payer: 59 | Admitting: Oncology

## 2023-08-19 ENCOUNTER — Encounter: Payer: Self-pay | Admitting: Oncology

## 2023-08-23 ENCOUNTER — Ambulatory Visit: Payer: Self-pay | Admitting: Family Medicine

## 2023-08-23 NOTE — Telephone Encounter (Signed)
 Copied from CRM [id number]. Topic: Clinical - Red Word Triage >> Aug 23, 2023  1:05 PM Hannah Kaiser wrote: [facility] that prompted transfer to Nurse Triage: Swellen and pain in both knees   Chief Complaint: Bilateral Knee pain and swelling Symptoms: Bilateral Knee Pain and Swelling Frequency: Since Last Week Pertinent Negatives: Patient denies fever Disposition: [] ED /[] Urgent Care (no appt availability in office) / [x] Appointment(In office/virtual)/ []  [place] [facility]/ [] Home Care/ [] Refused Recommended Disposition /[] [place] [facility]/ []  Follow-up with PCP Additional Notes: RR is being triaged for bilateral knee pain and swelling, which is worse on the right side. The patient denies a fever or redness. States she only finds relief when she is not on her feet. Scheduled patient per protocol on 08/24/2023.   Reason for Disposition  SEVERE swelling (e.g., can't move swollen knee at all)  Answer Assessment - Initial Assessment Questions 1. LOCATION: "Where is the swelling located?"  (e.g., left, right, both knees)     Both Knees, Right is worse  2. ONSET: "When did the swelling start?" "Does it come and go, or is it there all the time?"     Last week  3. SWELLING: "How bad is the swelling?" Or, "How large is it?" (e.g., mild, moderate, severe; size of localized swelling)    - NONE: No joint swelling.   - LOCALIZED: Localized; small area of puffy or swollen skin (e.g., insect bite, skin irritation).   - MILD: Joint looks or feels mildly swollen or puffy.   - MODERATE: Swollen; interferes with normal activities (e.g., work or school); can't move joint normally (bend and straighten completely); may be limping.   - SEVERE: Very swollen; can't move swollen joint at all; limping a lot or unable to walk.     Severe  4. PAIN: "Is there any pain?" If Yes, ask: "How bad is it?" (Scale 1-10; or mild, moderate, severe)   - NONE (0): no pain.   - MILD (1-3): doesn't interfere with normal  activities.    - MODERATE (4-7): interferes with normal activities (e.g., work or school) or awakens from sleep, limping.    - SEVERE (8-10): excruciating pain, unable to do any normal activities, unable to walk.      10  5. SETTING: "Has there been any recent work, exercise or other activity that involved that part of the body?"      The patient is a hairdresser  6. AGGRAVATING FACTORS: "What makes the knee swelling worse?" (e.g., walking, climbing stairs, running)     Any use  7. ASSOCIATED SYMPTOMS: "Is there any pain or redness?"     Pain, Swelling, Warmth  8. OTHER SYMPTOMS: "Do you have any other symptoms?" (e.g., chest pain, difficulty breathing, fever, calf pain)     No  9. PREGNANCY: "Is there any chance you are pregnant?" "When was your last menstrual period?"     No and No  Protocols used: Knee Swelling-A-AH

## 2023-08-24 ENCOUNTER — Encounter: Payer: Self-pay | Admitting: Pediatrics

## 2023-08-24 ENCOUNTER — Ambulatory Visit (INDEPENDENT_AMBULATORY_CARE_PROVIDER_SITE_OTHER): Admitting: Pediatrics

## 2023-08-24 VITALS — BP 136/89 | HR 80 | Temp 98.7°F | Resp 16 | Ht 65.0 in | Wt 236.4 lb

## 2023-08-24 DIAGNOSIS — M170 Bilateral primary osteoarthritis of knee: Secondary | ICD-10-CM

## 2023-08-24 DIAGNOSIS — Z1330 Encounter for screening examination for mental health and behavioral disorders, unspecified: Secondary | ICD-10-CM

## 2023-08-24 NOTE — Assessment & Plan Note (Signed)
 Bilateral knee pain, severe in right knee, with swelling. Previous ultrasound ruled out thrombosis, a few years ago. She has had knee injections before but given no recent imaging, would recommend repeat xray prior to injections.  Discussed informed consent regarding imaging necessity. Alternative pain management includes meloxicam and Voltaren gel in the meantime. Pt prefers to go to [facility] today as they offer in house expedited x-ray and injection. - Recommend using Voltaren gel for anti-inflammatory effects. - Scheduled at [facility] for same-day x-ray and potential injection.

## 2023-08-24 NOTE — Progress Notes (Signed)
 Office Visit  BP 136/89 (BP Location: Left Arm, Patient Position: Sitting, Cuff Size: Large)   Pulse 80   Temp 98.7 F (37.1 C) (Oral)   Resp 16   Ht 5\' 5"  (1.651 m)   Wt 236 lb 6.4 oz (107.2 kg)   SpO2 97%   BMI 39.34 kg/m    Subjective:    Patient ID: Hannah Kaiser, female    DOB: 04-25-1961, 63 y.o.   MRN: [id number]  HPI: Hannah Kaiser is a 63 y.o. female  Chief Complaint  Patient presents with   Knee Pain    B/L knee pain and would like to talk about injections, steroids.     Discussed the use of AI scribe software for clinical note transcription with the patient, who gave verbal consent to proceed.  History of Present Illness   Hannah Kaiser is a 63 year old female who presents with severe knee pain.  She has been experiencing severe knee pain that began last week, initially located at the back of the knee and then shifting to the front by Sunday. The pain is primarily on the right side but is also present in the left knee. She describes the pain as intense and similar to her usual bone pain but more severe. No recent falls or travel have occurred that could have contributed to the pain.  There is constant swelling in her knees, with the right knee appearing more swollen than the left. She has a history of an ultrasound being performed due to concerns about blood clots, which revealed a cyst but no clots. She mentions a history of knee injections, although it has been years since her last one.  For pain management, she alternates between Tylenol and ibuprofen, although these do not provide significant relief. She has previously used meloxicam for other conditions but is not currently taking it. She also has access to lidocaine patches from her brother, which she uses for pain relief.  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        Relevant past medical, surgical, family and social  history reviewed and updated as indicated. Interim medical history since our last visit reviewed. Allergies and medications reviewed and updated.  ROS per HPI unless specifically indicated above     Objective:    BP 136/89 (BP Location: Left Arm, Patient Position: Sitting, Cuff Size: Large)   Pulse 80   Temp 98.7 F (37.1 C) (Oral)   Resp 16   Ht 5\' 5"  (1.651 m)   Wt 236 lb 6.4 oz (107.2 kg)   SpO2 97%   BMI 39.34 kg/m   Wt Readings from Last 3 Encounters:  08/24/23 236 lb 6.4 oz (107.2 kg)  06/28/23 240 lb 12.8 oz (109.2 kg)  06/15/23 238 lb 9.6 oz (108.2 kg)     Physical Exam Constitutional:      Appearance: Normal appearance.  Pulmonary:     Effort: Pulmonary effort is normal.  Musculoskeletal:        General: Normal range of motion.     Right knee: Tenderness present over the medial joint line and MCL. No LCL laxity, MCL laxity, ACL laxity or PCL laxity.     Left knee: Tenderness present over the medial joint line, MCL and patellar tendon. No LCL laxity, MCL laxity, ACL laxity or PCL laxity.    Comments: Difficult to identify landmarks due to body  habitus. Right leg visibly more swollen than left.  Skin:    Comments: Normal skin color  Neurological:     General: No focal deficit present.     Mental Status: She is alert. Mental status is at baseline.  Psychiatric:        Mood and Affect: Mood normal.        Behavior: Behavior normal.        Thought Content: Thought content normal.         08/24/2023    8:13 AM 06/15/2023   11:31 AM 05/17/2023    1:07 PM 03/16/2023    2:18 PM 11/19/2022    8:49 AM  Depression screen PHQ 2/9  Decreased Interest 2 2 1 1 1   Down, Depressed, Hopeless  1 0 1 1  PHQ - 2 Score 2 3 1 2 2   Altered sleeping 2 3 2 2 1   Tired, decreased energy 3 3 3 2 1   Change in appetite 3 3 3 3 1   Feeling bad or failure about yourself  1 1 0 1 0  Trouble concentrating 2 1 0 1 1  Moving slowly or fidgety/restless 0 1 0 1 0  Suicidal thoughts 0 0 0 0 0   PHQ-9 Score 13 15 9 12 6   Difficult doing work/chores Somewhat difficult Somewhat difficult Somewhat difficult Somewhat difficult Somewhat difficult       08/24/2023    8:14 AM 06/15/2023   11:31 AM 05/17/2023    1:07 PM 03/16/2023    2:19 PM  GAD 7 : Generalized Anxiety Score  Nervous, Anxious, on Edge 3 2 1 1   Control/stop worrying 3 1 1 1   Worry too much - different things 1 1 1 1   Trouble relaxing 3 3 1 1   Restless 3 3 1 1   Easily annoyed or irritable 3 3 1 3   Afraid - awful might happen 2 1 1 1   Total GAD 7 Score 18 14 7 9   Anxiety Difficulty Somewhat difficult Somewhat difficult Somewhat difficult Somewhat difficult       Assessment & Plan:  Assessment & Plan   Primary osteoarthritis of both knees Assessment & Plan: Bilateral knee pain, severe in right knee, with swelling. Previous ultrasound ruled out thrombosis, a few years ago. She has had knee injections before but given no recent imaging, would recommend repeat xray prior to injections.  Discussed informed consent regarding imaging necessity. Alternative pain management includes meloxicam and Voltaren gel in the meantime. Pt prefers to go to [facility] today as they offer in house expedited x-ray and injection. - Recommend using Voltaren gel for anti-inflammatory effects. - Scheduled at [facility] for same-day x-ray and potential injection.    Encounter for behavioral health screening As part of their intake evaluation, the patient was screened for depression, anxiety.  PHQ9 SCORE 13, GAD7 SCORE 18. Screening results positive for tested conditions. Higher scores today due to pain. Recommend PCP follow up, no acute safety concerns.   Follow up plan: Return if symptoms worsen or fail to improve.  Jackolyn Confer, MD  Approximately 30 minutes spent on patient encounter today including assessment, counseling, diagnosing, treatment plan development, and charting.

## 2023-08-24 NOTE — Patient Instructions (Signed)
 [facility] appointment:  Tuesday, March 18th 2025 at 1:00 pm Provider: Beverly Milch PA-C Location: [facility] [address] , [place], [place], [address]

## 2023-08-30 ENCOUNTER — Ambulatory Visit: Admitting: Cardiovascular Disease

## 2023-10-10 ENCOUNTER — Other Ambulatory Visit: Payer: Self-pay | Admitting: Family Medicine

## 2023-10-12 ENCOUNTER — Encounter: Payer: Self-pay | Admitting: Oncology

## 2023-10-12 MED ORDER — IBUPROFEN 800 MG PO TABS: 800.0000 mg | ORAL_TABLET | Freq: Three times a day (TID) | ORAL | 0 refills | Status: DC | PRN

## 2023-10-12 MED ORDER — CLONAZEPAM 1 MG PO TABS: 1.0000 mg | ORAL_TABLET | Freq: Two times a day (BID) | ORAL | 0 refills | Status: DC | PRN

## 2023-10-12 MED ORDER — VITAMIN D (ERGOCALCIFEROL) 1.25 MG (50000 UNIT) PO CAPS: 50000.0000 [IU] | ORAL_CAPSULE | ORAL | 2 refills | Status: DC

## 2023-10-12 NOTE — Telephone Encounter (Signed)
Appointment scheduled for 10/19/23.

## 2023-10-19 ENCOUNTER — Encounter: Payer: Self-pay | Admitting: Family Medicine

## 2023-10-19 ENCOUNTER — Ambulatory Visit (INDEPENDENT_AMBULATORY_CARE_PROVIDER_SITE_OTHER): Admitting: Family Medicine

## 2023-10-19 VITALS — BP 147/87 | HR 59 | Ht 65.0 in | Wt 243.2 lb

## 2023-10-19 DIAGNOSIS — R42 Dizziness and giddiness: Secondary | ICD-10-CM | POA: Insufficient documentation

## 2023-10-19 DIAGNOSIS — I25112 Atherosclerotic heart disease of native coronary artery with refractory angina pectoris: Secondary | ICD-10-CM

## 2023-10-19 DIAGNOSIS — R0789 Other chest pain: Secondary | ICD-10-CM

## 2023-10-19 DIAGNOSIS — F419 Anxiety disorder, unspecified: Secondary | ICD-10-CM | POA: Diagnosis not present

## 2023-10-19 DIAGNOSIS — F4321 Adjustment disorder with depressed mood: Secondary | ICD-10-CM

## 2023-10-19 DIAGNOSIS — R0602 Shortness of breath: Secondary | ICD-10-CM

## 2023-10-19 DIAGNOSIS — D509 Iron deficiency anemia, unspecified: Secondary | ICD-10-CM

## 2023-10-19 DIAGNOSIS — E039 Hypothyroidism, unspecified: Secondary | ICD-10-CM

## 2023-10-19 DIAGNOSIS — R7301 Impaired fasting glucose: Secondary | ICD-10-CM

## 2023-10-19 DIAGNOSIS — F332 Major depressive disorder, recurrent severe without psychotic features: Secondary | ICD-10-CM

## 2023-10-19 DIAGNOSIS — E782 Mixed hyperlipidemia: Secondary | ICD-10-CM

## 2023-10-19 LAB — BAYER DCA HB A1C WAIVED: HB A1C (BAYER DCA - WAIVED): 5.4 % (ref 4.8–5.6)

## 2023-10-19 MED ORDER — IBUPROFEN 800 MG PO TABS: 800.0000 mg | ORAL_TABLET | Freq: Three times a day (TID) | ORAL | 0 refills | Status: DC | PRN

## 2023-10-19 MED ORDER — ALBUTEROL SULFATE HFA 108 (90 BASE) MCG/ACT IN AERS: 1.0000 | INHALATION_SPRAY | Freq: Four times a day (QID) | RESPIRATORY_TRACT | 1 refills | Status: AC | PRN

## 2023-10-19 MED ORDER — FLUOXETINE HCL 40 MG PO CAPS: 80.0000 mg | ORAL_CAPSULE | Freq: Every day | ORAL | 1 refills | Status: DC

## 2023-10-19 MED ORDER — ONDANSETRON HCL 4 MG PO TABS: 4.0000 mg | ORAL_TABLET | Freq: Three times a day (TID) | ORAL | 3 refills | Status: AC | PRN

## 2023-10-19 MED ORDER — LOSARTAN POTASSIUM 100 MG PO TABS: 100.0000 mg | ORAL_TABLET | Freq: Every day | ORAL | 1 refills | Status: DC

## 2023-10-19 MED ORDER — PANTOPRAZOLE SODIUM 40 MG PO TBEC: 40.0000 mg | DELAYED_RELEASE_TABLET | Freq: Two times a day (BID) | ORAL | 1 refills | Status: DC

## 2023-10-19 MED ORDER — TIZANIDINE HCL 4 MG PO TABS: 4.0000 mg | ORAL_TABLET | Freq: Three times a day (TID) | ORAL | 1 refills | Status: AC | PRN

## 2023-10-19 MED ORDER — BUSPIRONE HCL 15 MG PO TABS: 15.0000 mg | ORAL_TABLET | Freq: Three times a day (TID) | ORAL | 1 refills | Status: DC

## 2023-10-19 MED ORDER — ATENOLOL 50 MG PO TABS: 50.0000 mg | ORAL_TABLET | Freq: Every day | ORAL | 1 refills | Status: DC

## 2023-10-19 MED ORDER — BREZTRI AEROSPHERE 160-9-4.8 MCG/ACT IN AERO: 2.0000 | INHALATION_SPRAY | Freq: Two times a day (BID) | RESPIRATORY_TRACT | 11 refills | Status: DC

## 2023-10-19 MED ORDER — AMLODIPINE BESYLATE 10 MG PO TABS: 10.0000 mg | ORAL_TABLET | Freq: Every day | ORAL | 1 refills | Status: DC

## 2023-10-19 MED ORDER — QUETIAPINE FUMARATE ER 50 MG PO TB24: 50.0000 mg | ORAL_TABLET | Freq: Every day | ORAL | 1 refills | Status: DC

## 2023-10-19 MED ORDER — EZETIMIBE 10 MG PO TABS: 10.0000 mg | ORAL_TABLET | Freq: Every day | ORAL | 1 refills | Status: DC

## 2023-10-19 MED ORDER — ISOSORBIDE MONONITRATE ER 60 MG PO TB24: 60.0000 mg | ORAL_TABLET | Freq: Every day | ORAL | 1 refills | Status: DC

## 2023-10-19 MED ORDER — FUROSEMIDE 20 MG PO TABS: ORAL_TABLET | ORAL | 3 refills | Status: DC

## 2023-10-19 MED ORDER — VALACYCLOVIR HCL 1 G PO TABS: 1000.0000 mg | ORAL_TABLET | Freq: Every day | ORAL | 1 refills | Status: DC

## 2023-10-19 MED ORDER — CLONAZEPAM 1 MG PO TABS: 1.0000 mg | ORAL_TABLET | Freq: Two times a day (BID) | ORAL | 0 refills | Status: DC | PRN

## 2023-10-19 NOTE — Assessment & Plan Note (Signed)
 Patient is having some postural dizziness when standing and chiefly with neck extension. She has no nystagmus and is not dizzy when not standing still or looking upward. -Handout on Epley maneuvers given -Follow up in 4 weeks to see if this has improved

## 2023-10-19 NOTE — Assessment & Plan Note (Addendum)
 Patient was doing better with addition of Seroquel  prior to her brother's death. She has been feeling understandably more depressed since then and feels like there is an emptiness in her life as her caregiving responsibilities have taken up so much of her time. She wishes to stay on her current medications for her anxiety and depression. 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 She does not have any personality changes and is able to complete all ADLs without issue. We will follow up with her on her memory in 4 weeks to assess for improvements following improvement of mood. -Continue Buspar , Prozac , and Seroquel  -Referral to Grief counseling

## 2023-10-19 NOTE — Progress Notes (Signed)
 Established Patient Office Visit  Subjective   Patient ID: Hannah Kaiser, female    DOB: [birth year]  Age: 63 y.o. MRN: [id number]  Chief Complaint  Patient presents with   Hypertension   Anxiety   Depression   Medication Refill    Hannah Kaiser is presenting to the office today to follow up on multiple conditions including her mood, hypertension, and some positional dizziness/unsteadiness on her feet. She recently experienced the loss of her brother in March for whom she was the main caretaker. Being his caretaker took up a lot of time and she feels like there is a big void now in her life. Since her brothers passing, her balance has been getting worse. She notices this most when standing and looking upwards.   In terms of her mood, the seroquel  helps with the sleep and "takes the edge off". Her mood was doing a bit better prior to her brother's passing but has worsened understandably since then. 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   For her hypertension, she did not take BP meds this morning but usually does and has been consistently in the 130s/80s. She has noticed some lower extremity edema and around 7 pounds of weight gain since last visit which she attributes to eating a bit more and having more fluid retention.   She also has a patch of itchy skin in the middle of her back which bothers her not infrequently.   She denies any unintentional weight loss, bowel or bladder dysfunction, hematochezia/hematuria. She does endorse generalized weakness, mild angina when ambulating, and orthopnea.  Hypertension Associated symptoms include anxiety, blurred vision, chest pain, orthopnea and shortness of breath.  Anxiety Symptoms include chest pain, dizziness, nausea and shortness of breath.    Depression        Past medical history includes anxiety.   Medication Refill Associated symptoms include  abdominal pain, chest pain, congestion, coughing, nausea, a rash and weakness. Pertinent negatives include no chills or fever.     Review of Systems  Constitutional:  Negative for chills, fever and weight loss.  HENT:  Positive for congestion. Negative for nosebleeds.   Eyes:  Positive for blurred vision, double vision and pain. Negative for photophobia.  Respiratory:  Positive for cough and shortness of breath. Negative for hemoptysis, sputum production and stridor.   Cardiovascular:  Positive for chest pain, orthopnea and leg swelling.  Gastrointestinal:  Positive for abdominal pain, heartburn and nausea. Negative for blood in stool.  Genitourinary: Negative.   Musculoskeletal:  Positive for joint pain.  Skin:  Positive for itching and rash.  Neurological:  Positive for dizziness and weakness.  Psychiatric/Behavioral:  Positive for depression and memory loss.       Objective:     BP (!) 147/87 (BP Location: Left Arm, Patient Position: Sitting, Cuff Size: Large)   Pulse (!) 59   Ht 5\' 5"  (1.651 m)   Wt 243 lb 3.2 oz (110.3 kg)   SpO2 98%   BMI 40.47 kg/m  BP Readings from Last 3 Encounters:  10/19/23 (!) 147/87  08/24/23 136/89  06/28/23 137/86   Wt Readings from Last 3 Encounters:  10/19/23 243 lb 3.2 oz (110.3 kg)  08/24/23 236 lb 6.4 oz (107.2 kg)  06/28/23 240 lb 12.8 oz (109.2 kg)      Physical Exam Constitutional:      Appearance: Normal appearance. She is obese.  HENT:     Head: Normocephalic and atraumatic.     Mouth/Throat:     Mouth: Mucous membranes are moist.     Pharynx: Oropharynx is clear.  Eyes:     Extraocular Movements: Extraocular movements intact.     Conjunctiva/sclera: Conjunctivae normal.     Pupils: Pupils are equal, round, and reactive to light.  Cardiovascular:     Rate and Rhythm: Normal rate and regular rhythm.  Pulmonary:     Effort: No respiratory distress.     Breath sounds: Normal breath sounds. No wheezing.  Abdominal:      General: There is no distension.     Palpations: Abdomen is soft.     Tenderness: There is no abdominal tenderness. There is no guarding.  Musculoskeletal:     Cervical back: Tenderness present.     Right lower leg: Edema present.     Left lower leg: Edema present.  Skin:    General: Skin is warm and dry.     Findings: No rash.     Comments: Mild dryness of the skin overlying the mid-lower thoracic spine  Neurological:     General: No focal deficit present.     Mental Status: She is alert and oriented to person, place, and time. Mental status is at baseline.     Motor: Weakness present.     Gait: Gait normal.     Comments: Negative Romberg  Psychiatric:        Behavior: Behavior normal.        Thought Content: Thought content normal.     Comments: Depressed but with normal affect.    No results found for any visits on 10/19/23.  Last CBC Lab Results  Component Value Date   WBC 6.5 05/17/2023   HGB 12.6 05/17/2023   HCT 38.2 05/17/2023   MCV 89 05/17/2023   MCH 29.4 05/17/2023   RDW 12.5 05/17/2023   PLT 212 05/17/2023   Last metabolic panel Lab Results  Component Value Date   GLUCOSE 92 06/15/2023   NA 141 06/15/2023   K 4.0 06/15/2023   CL 106 06/15/2023   CO2 21 06/15/2023   BUN 16 06/15/2023   CREATININE 0.85 06/15/2023   EGFR 78 06/15/2023   CALCIUM  9.1 06/15/2023   PROT 6.2 05/17/2023   ALBUMIN 4.3 05/17/2023   LABGLOB 1.9 05/17/2023   AGRATIO 3.1 (H) 10/06/2022   BILITOT 0.4 05/17/2023   ALKPHOS 67 05/17/2023   AST 20 05/17/2023   ALT 12 05/17/2023   ANIONGAP 9 01/14/2017   Last lipids Lab Results  Component Value Date   CHOL 148 05/17/2023   HDL 54 05/17/2023   LDLCALC 70 05/17/2023   TRIG 139 05/17/2023   CHOLHDL 4.7 (H) 08/30/2020   Last hemoglobin A1c Lab Results  Component Value Date   HGBA1C 5.7 (H) 05/17/2023   Last thyroid functions Lab Results  Component Value Date   TSH 0.718 05/17/2023      The 10-year ASCVD risk score  (Arnett DK, et al., 2019) is: 10.6%    Assessment & Plan:   Problem List Items Addressed This Visit       Cardiovascular and Mediastinum   CAD (coronary artery disease)   Patient has been reestablished with her cardiologist and is being managed by them. Doing well on current medication regimen. Was recommended to take her furosemide  for her lower extremity edema. -continue current medications -Take lasix  for LEE      Relevant Medications  amLODipine  (NORVASC ) 10 MG tablet   atenolol  (TENORMIN ) 50 MG tablet   ezetimibe  (ZETIA ) 10 MG tablet   furosemide  (LASIX ) 20 MG tablet   isosorbide  mononitrate (IMDUR ) 60 MG 24 hr tablet   losartan  (COZAAR ) 100 MG tablet     Endocrine   Hypothyroidism   No symptoms concerning for hypothyroidism today -Check TSH -follow up pending results       Relevant Medications   atenolol  (TENORMIN ) 50 MG tablet   Other Relevant Orders   TSH   IFG (impaired fasting glucose)   A1C has been in prediabetic range for the past year. -Monitor A1C and follow up accordingly      Relevant Orders   Bayer DCA Hb A1c Waived     Other   Hyperlipidemia   Under good control, continue current medication regimen.      Relevant Medications   amLODipine  (NORVASC ) 10 MG tablet   atenolol  (TENORMIN ) 50 MG tablet   ezetimibe  (ZETIA ) 10 MG tablet   furosemide  (LASIX ) 20 MG tablet   isosorbide  mononitrate (IMDUR ) 60 MG 24 hr tablet   losartan  (COZAAR ) 100 MG tablet   Other Relevant Orders   Comprehensive metabolic panel with GFR   Lipid Panel w/o Chol/HDL Ratio   Severe episode of recurrent major depressive disorder, without psychotic features (HCC)   Patient was doing better with addition of Seroquel  prior to her brother's death. She has been feeling understandably more depressed since then and feels like there is an emptiness in her life as her caregiving responsibilities have taken up so much of her time. She wishes to stay on her current medications for  her anxiety and depression. 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 She does not have any personality changes and is able to complete all ADLs without issue. We will follow up with her on her memory in 4 weeks to assess for improvements following improvement of mood. -Continue Buspar , Prozac , and Seroquel  -Referral to Grief counseling       Relevant Medications   busPIRone  (BUSPAR ) 15 MG tablet   FLUoxetine  (PROZAC ) 40 MG capsule   Anxiety - Primary   Patient was doing better with addition of Seroquel  prior to her brother's death. She has been feeling understandably more depressed since then and feels like there is an emptiness in her life as her caregiving responsibilities have taken up so much of her time. She wishes to stay on her current medications for her anxiety and depression. -Continue Buspar , Prozac , and Seroquel  -Referral to Grief counseling       Relevant Medications   busPIRone  (BUSPAR ) 15 MG tablet   FLUoxetine  (PROZAC ) 40 MG capsule   Iron  deficiency anemia   Rechecking labs today; consider checking back in with hematologist pending results.      Relevant Orders   Iron  Binding Cap (TIBC)([facility])   Ferritin   Grief   Relevant Orders   AMB Referral VBCI Care Management   Dizziness after extension of neck   Patient is having some postural dizziness when standing and chiefly with neck extension. She has no nystagmus and is not dizzy when not standing still or looking upward. -Handout on Epley maneuvers given -Follow up in 4 weeks to see if this has improved      Other Visit Diagnoses       Other chest pain       Patient had a nuclear stress  test by [facility] medical group Dr. Gollan which showed no evidence of ischemia with normal ejection fraction.  Add Ranexa      SOB (shortness of breath)       Patient continues to have shortness of breath thus  will be evaluated by doing echocardiogram.   Relevant Orders   CBC with Differential/Platelet       Return in about 4 weeks (around 11/16/2023).    Monda Angry, Medical Student

## 2023-10-19 NOTE — Assessment & Plan Note (Addendum)
 Patient has been reestablished with her cardiologist and is being managed by them. Doing well on current medication regimen. Was recommended to take her furosemide  for her lower extremity edema. -continue current medications -Take lasix  for LEE

## 2023-10-19 NOTE — Assessment & Plan Note (Signed)
 A1C has been in prediabetic range for the past year. -Monitor A1C and follow up accordingly

## 2023-10-19 NOTE — Assessment & Plan Note (Signed)
 Under good control, continue current medication regimen.

## 2023-10-19 NOTE — Assessment & Plan Note (Signed)
 No symptoms concerning for hypothyroidism today -Check TSH -follow up pending results

## 2023-10-19 NOTE — Assessment & Plan Note (Signed)
 Rechecking labs today; consider checking back in with hematologist pending results.

## 2023-10-19 NOTE — Assessment & Plan Note (Signed)
 Patient was doing better with addition of Seroquel  prior to her brother's death. She has been feeling understandably more depressed since then and feels like there is an emptiness in her life as her caregiving responsibilities have taken up so much of her time. She wishes to stay on her current medications for her anxiety and depression. -Continue Buspar , Prozac , and Seroquel  -Referral to Grief counseling

## 2023-10-20 ENCOUNTER — Other Ambulatory Visit: Payer: Self-pay | Admitting: Cardiovascular Disease

## 2023-10-20 ENCOUNTER — Telehealth: Payer: Self-pay

## 2023-10-20 DIAGNOSIS — R0789 Other chest pain: Secondary | ICD-10-CM

## 2023-10-20 DIAGNOSIS — R0602 Shortness of breath: Secondary | ICD-10-CM

## 2023-10-20 DIAGNOSIS — I25112 Atherosclerotic heart disease of native coronary artery with refractory angina pectoris: Secondary | ICD-10-CM

## 2023-10-20 DIAGNOSIS — E782 Mixed hyperlipidemia: Secondary | ICD-10-CM

## 2023-10-20 LAB — CBC WITH DIFFERENTIAL/PLATELET
Basophils Absolute: 0 10*3/uL (ref 0.0–0.2)
Basos: 1 %
EOS (ABSOLUTE): 0.2 10*3/uL (ref 0.0–0.4)
Eos: 3 %
Hematocrit: 37 % (ref 34.0–46.6)
Hemoglobin: 12 g/dL (ref 11.1–15.9)
Immature Grans (Abs): 0 10*3/uL (ref 0.0–0.1)
Immature Granulocytes: 0 %
Lymphocytes Absolute: 1.6 10*3/uL (ref 0.7–3.1)
Lymphs: 28 %
MCH: 29.6 pg (ref 26.6–33.0)
MCHC: 32.4 g/dL (ref 31.5–35.7)
MCV: 91 fL (ref 79–97)
Monocytes Absolute: 0.4 10*3/uL (ref 0.1–0.9)
Monocytes: 7 %
Neutrophils Absolute: 3.4 10*3/uL (ref 1.4–7.0)
Neutrophils: 61 %
Platelets: 221 10*3/uL (ref 150–450)
RBC: 4.06 x10E6/uL (ref 3.77–5.28)
RDW: 13.4 % (ref 11.7–15.4)
WBC: 5.6 10*3/uL (ref 3.4–10.8)

## 2023-10-20 LAB — COMPREHENSIVE METABOLIC PANEL WITH GFR
ALT: 10 IU/L (ref 0–32)
AST: 17 IU/L (ref 0–40)
Albumin: 4.3 g/dL (ref 3.9–4.9)
Alkaline Phosphatase: 64 IU/L (ref 44–121)
BUN/Creatinine Ratio: 12 (ref 12–28)
BUN: 11 mg/dL (ref 8–27)
Bilirubin Total: 0.5 mg/dL (ref 0.0–1.2)
CO2: 20 mmol/L (ref 20–29)
Calcium: 9 mg/dL (ref 8.7–10.3)
Chloride: 108 mmol/L — ABNORMAL HIGH (ref 96–106)
Creatinine, Ser: 0.95 mg/dL (ref 0.57–1.00)
Globulin, Total: 1.9 g/dL (ref 1.5–4.5)
Glucose: 88 mg/dL (ref 70–99)
Potassium: 4.6 mmol/L (ref 3.5–5.2)
Sodium: 142 mmol/L (ref 134–144)
Total Protein: 6.2 g/dL (ref 6.0–8.5)
eGFR: 68 mL/min/{1.73_m2} (ref >59)

## 2023-10-20 LAB — LIPID PANEL W/O CHOL/HDL RATIO
Cholesterol, Total: 227 mg/dL — ABNORMAL HIGH (ref 100–199)
HDL: 53 mg/dL (ref >39)
LDL Chol Calc (NIH): 150 mg/dL — ABNORMAL HIGH (ref 0–99)
Triglycerides: 135 mg/dL (ref 0–149)
VLDL Cholesterol Cal: 24 mg/dL (ref 5–40)

## 2023-10-20 LAB — TSH: TSH: 0.785 u[IU]/mL (ref 0.450–4.500)

## 2023-10-20 LAB — FERRITIN: Ferritin: 15 ng/mL (ref 15–150)

## 2023-10-20 LAB — IRON AND TIBC
Iron Saturation: 21 % (ref 15–55)
Iron: 74 ug/dL (ref 27–139)
Total Iron Binding Capacity: 353 ug/dL (ref 250–450)
UIBC: 279 ug/dL (ref 118–369)

## 2023-10-20 NOTE — Progress Notes (Signed)
 Complex Care Management Note  Care Guide Note 10/20/2023 Name: Hannah Kaiser MRN: [id number] DOB: 1961-02-11  Hannah Kaiser is a 63 y.o. year old female who sees Solomon Dupre, DO for primary care. I reached out to [employer] by phone today to offer complex care management services.  Ms. Zoll was given information about Complex Care Management services today including:   The Complex Care Management services include support from the care team which includes your Nurse Care Manager, Clinical Social Worker, or Pharmacist.  The Complex Care Management team is here to help remove barriers to the health concerns and goals most important to you. Complex Care Management services are voluntary, and the patient may decline or stop services at any time by request to their care team member.   Complex Care Management Consent Status: Patient agreed to services and verbal consent obtained.   Follow up plan:  Telephone appointment with complex care management team member scheduled for:  10/25/2023  Encounter Outcome:  Patient Scheduled  Lenton Rail , RMA     [place]  [facility], [facility] Guide  Direct Dial: [phone]  Website: [email or web address]

## 2023-10-22 ENCOUNTER — Ambulatory Visit: Payer: Self-pay | Admitting: Family Medicine

## 2023-10-25 ENCOUNTER — Other Ambulatory Visit: Payer: Self-pay | Admitting: Licensed Clinical Social Worker

## 2023-10-25 NOTE — Patient Instructions (Signed)
 Visit Information  Thank you for taking time to visit with me today. Please don't hesitate to contact me if I can be of assistance to you before our next scheduled appointment.  Our next appointment is by telephone on 11/30/2023. Please call the care guide team at [phone] if you need to cancel or reschedule your appointment.   Following is a copy of your care plan:   Goals Addressed             This Visit's Progress    VBCI Social Work Care Plan       Problems:   Disease Management support and education needs related to Grief  CSW Clinical Goal(s):   Over the next 6 weeks the Patient will demonstrate a reduction in symptoms related to Grief .  Interventions:  Mental Health:  Evaluation of current treatment plan related to Grief Active listening / Reflection utilized Behavioral Activation reviewed Depression screen reviewed Emotional Support Provided Motivational Interviewing employed PHQ2/PHQ9 completed Problem Solving /Task Center strategies reviewed Provided general psycho-education for mental health needs Quality of sleep assessed & Sleep Hygiene techniques promoted  Patient Goals/Self-Care Activities:  Continue taking your medication as prescribed.   Review Agricultural engineer on Behavioral Activation. Follow up with [facility] for grief counseling  Plan:   Telephone follow up appointment with care management team member scheduled for:  11/30/2023        Please call the Suicide and Crisis Lifeline: 988 if you are experiencing a Mental Health or Behavioral Health Crisis or need someone to talk to.  Patient verbalizes understanding of instructions and care plan provided today and agrees to view in MyChart. Active MyChart status and patient understanding of how to access instructions and care plan via MyChart confirmed with patient.     Hale Level, LCSW [place], [facility] Licensed Clinical Social Worker Care  Coordinator [phone]

## 2023-10-25 NOTE — Patient Outreach (Signed)
 Complex Care Management   Visit Note  10/25/2023  Name:  Hannah Kaiser MRN: [id number] DOB: Mar 03, 1961  Situation: Referral received for Complex Care Management related to grief I obtained verbal consent from Patient.  Visit completed with patient  on the phone  Background:   Past Medical History:  Diagnosis Date   Anemia    Anxiety    Arthritis    knees, thoracic spurs and arthritis   Asthma    Breast pain    CHF (congestive heart failure) (HCC)    Colon polyps    COPD (chronic obstructive pulmonary disease) (HCC)    Depression    Dysrhythmia    GERD (gastroesophageal reflux disease)    Glaucoma    Heart murmur    History of blood transfusion    Hypertension    Motion sickness    cars   Multilevel degenerative disc disease    Neuropathy    Bilateral arms and legs   Nipple discharge    Numbness of both lower extremities    bulging lumbar discs - per pt   Numbness of upper extremity    bilateral - degenerative cervical discs - per pt   Pneumonia    PONV (postoperative nausea and vomiting)    Sleep apnea    has CPAP   Smokers' cough (HCC)    Swelling of limb 03/11/2021   Wears dentures    full upper and lower    Assessment: Patient Reported Symptoms:  Cognitive Cognitive Status: Alert and oriented to person, place, and time, Insightful and able to interpret abstract concepts, Normal speech and language skills Cognitive/Intellectual Conditions Management [RPT]: None reported or documented in medical history or problem list   Health Maintenance Behaviors: Spiritual practice(s), Stress management, Sleep adequate, Healthy diet Health Facilitated by: Stress management  Neurological Neurological Review of Symptoms: No symptoms reported Neurological Management Strategies: Coping strategies, Counseling, Diet modification, Medication therapy Neurological Self-Management Outcome: 3 (uncertain) Neurological Comment: Reports some memory concerns - PCP believes it could be  due to grief - will reassess at next PCP appt  HEENT HEENT Symptoms Reported: Other: Other HEENT Symptoms/Conditions: Difficulty swallowing, PCP is managing - has had swallowing test. Taking medications HEENT Management Strategies: Diet modification, Medication therapy HEENT Self-Management Outcome: 4 (good)    Cardiovascular Cardiovascular Symptoms Reported: Dizziness, Fatigue, Swelling in legs or feet (Gets fatigued from walking long distances) Does patient have uncontrolled Hypertension?: Yes Is patient checking Blood Pressure at home?: No Cardiovascular Conditions: Coronary artery disease, Hypertension Cardiovascular Management Strategies: Activity, Diet modification, Medication therapy Cardiovascular Self-Management Outcome: 3 (uncertain)  Respiratory Respiratory Symptoms Reported: Shortness of breath Respiratory Conditions: COPD Respiratory Self-Management Outcome: 4 (good) Respiratory Comment: Reports taking her medication regularly helps manage symptoms - no concerns  Endocrine Patient reports the following symptoms related to hypoglycemia or hyperglycemia : No symptoms reported    Gastrointestinal Gastrointestinal Symptoms Reported: Abdominal pain or discomfort, Change in appetite Additional Gastrointestinal Details: Reports polyps that are managed by PCP Gastrointestinal Conditions: Abdominal pain, Reflux/heartburn Gastrointestinal Management Strategies: Medication therapy, Diet modification Gastrointestinal Self-Management Outcome: 4 (good) Nutrition Risk Screen (CP): Difficulty chewing/swallowing  Genitourinary Genitourinary Symptoms Reported: No symptoms reported    Integumentary Integumentary Symptoms Reported: No symptoms reported    Musculoskeletal Musculoskelatal Symptoms Reviewed: Difficulty walking, Muscle pain Additional Musculoskeletal Details: Reports pain in back and knee Musculoskeletal Conditions: Back pain, Joint pain, Mobility limited Musculoskeletal  Management Strategies: Activity, Coping strategies, Medication therapy Musculoskeletal Self-Management Outcome: 4 (good) Falls in the past year?:  No Number of falls in past year: 1 or less Was there an injury with Fall?: No Fall Risk Category Calculator: 0 Patient Fall Risk Level: Low Fall Risk Patient at Risk for Falls Due to: Impaired mobility  Psychosocial Psychosocial Symptoms Reported: Alteration in eating habits, Depression - if selected complete PHQ 2-9, Anxiety - if selected complete GAD Additional Psychological Details: Lost brother and mother recently Behavioral Health Conditions: Depression, Anxiety Behavioral Management Strategies: Activity, Coping strategies, Counseling, Medication therapy, Support system, [employer] [facility] Self-Management Outcome: 3 (uncertain) Major Change/Loss/Stressor/Fears (CP): Death of a loved one, Environment Techniques to [employer] with Loss/Stress/Change: Counseling, Medication Quality of Family Relationships: helpful, involved, supportive Do you feel physically threatened by others?: No      10/25/2023    9:27 AM  Depression screen PHQ 2/9  Decreased Interest 2  Down, Depressed, Hopeless 1  PHQ - 2 Score 3  Altered sleeping 0  Tired, decreased energy 3  Change in appetite 3  Feeling bad or failure about yourself  0  Trouble concentrating 2  Moving slowly or fidgety/restless 0  Suicidal thoughts 0  PHQ-9 Score 11  Difficult doing work/chores Somewhat difficult    There were no vitals filed for this visit.  Medications Reviewed Today     Reviewed by Jens Molder, LCSW (Social Worker) on 10/25/23 at [phone]  Med List Status: <None>   Medication Order Taking? Sig Documenting Provider Last Dose Status Informant  albuterol  (VENTOLIN  HFA) 108 (90 Base) MCG/ACT inhaler [id number] Yes Inhale 1-2 puffs into the lungs every 6 (six) hours as needed for wheezing or shortness of breath. Terre Ferri P, DO Taking Active   amLODipine   (NORVASC ) 10 MG tablet [id number] Yes Take 1 tablet (10 mg total) by mouth daily. Terre Ferri P, DO Taking Active   aspirin  EC 81 MG tablet [id number] Yes Take 1 tablet (81 mg total) by mouth daily. Swallow whole. Cherrie Cornwall, MD Taking Active   atenolol  (TENORMIN ) 50 MG tablet [id number] Yes Take 1 tablet (50 mg total) by mouth daily. Johnson, Megan P, DO Taking Active   budeson-glycopyrrolate -formoterol  (BREZTRI  AEROSPHERE) 160-9-4.8 MCG/ACT AERO inhaler [id number] Yes Inhale 2 puffs into the lungs 2 (two) times daily. Terre Ferri P, DO Taking Active   busPIRone  (BUSPAR ) 15 MG tablet [id number] Yes Take 1 tablet (15 mg total) by mouth 3 (three) times daily. Terre Ferri P, DO Taking Active   clonazePAM  (KLONOPIN ) 1 MG tablet [id number] Yes Take 1 tablet (1 mg total) by mouth 2 (two) times daily as needed. for anxiety Terre Ferri P, DO Taking Active   ezetimibe  (ZETIA ) 10 MG tablet [id number] Yes Take 1 tablet (10 mg total) by mouth daily. Terre Ferri P, DO Taking Active   FLUoxetine  (PROZAC ) 40 MG capsule [id number] Yes Take 2 capsules (80 mg total) by mouth daily. Lincoln Renshaw, Megan P, DO Taking Active   furosemide  (LASIX ) 20 MG tablet [id number] Yes TAKE 1 TABLET BY MOUTH ONCE DAILY AS NEEDED FOR SWELLING Johnson, Megan P, DO Taking Active   hydrOXYzine  (ATARAX ) 25 MG tablet [id number] Yes Take 1 tablet (25 mg total) by mouth 3 (three) times daily as needed. Terre Ferri P, DO Taking Active   ibuprofen  (ADVIL ) 800 MG tablet [id number] Yes Take 1 tablet (800 mg total) by mouth every 8 (eight) hours as needed. Terre Ferri P, DO Taking Active   isosorbide  mononitrate (IMDUR ) 60 MG 24 hr tablet [id number] Yes Take 1 tablet (60 mg total) by mouth daily. Johnson, Megan P,  DO Taking Active   losartan  (COZAAR ) 100 MG tablet [id number] Yes Take 1 tablet (100 mg total) by mouth daily. Terre Ferri P, DO Taking Active   mupirocin  ointment (BACTROBAN ) 2 % [id number] Yes Apply 1 application. topically 2  (two) times daily. Izella Marshal, MD Taking Active   nystatin -triamcinolone  ointment [facility]) [id number] Yes Apply 1 application topically 2 (two) times daily. Aileen Alexanders, NP Taking Active   ondansetron  (ZOFRAN ) 4 MG tablet [id number] Yes Take 1 tablet (4 mg total) by mouth every 8 (eight) hours as needed for nausea or vomiting. Terre Ferri P, DO Taking Active   pantoprazole  (PROTONIX ) 40 MG tablet [id number] Yes Take 1 tablet (40 mg total) by mouth 2 (two) times daily. Terre Ferri P, DO Taking Active   QUEtiapine  (SEROQUEL  XR) 50 MG TB24 24 hr tablet [id number] Yes Take 1 tablet (50 mg total) by mouth at bedtime. Johnson, Megan P, DO Taking Active   ranolazine  (RANEXA ) 500 MG 12 hr tablet [id number] Yes TAKE 1 TABLET BY MOUTH TWICE A DAY Scoggins, Amber, NP Taking Active   rosuvastatin  (CRESTOR ) 10 MG tablet [id number] Yes Take 1 tablet (10 mg total) by mouth every other day. Gollan, Timothy J, MD Taking Active   tiZANidine  (ZANAFLEX ) 4 MG tablet [id number] Yes Take 1 tablet (4 mg total) by mouth every 8 (eight) hours as needed for muscle spasms. Terre Ferri P, DO Taking Active   triamcinolone  ointment (KENALOG ) 0.5 % [id number] No Apply 1 application topically 2 (two) times daily.  Patient not taking: Reported on 10/25/2023   Aileen Alexanders, NP Not Taking Active   valACYclovir  (VALTREX ) 1000 MG tablet [id number] Yes Take 1 tablet (1,000 mg total) by mouth daily. Johnson, Megan P, DO Taking Active   Vitamin D , Ergocalciferol , (DRISDOL ) 1.25 MG (50000 UNIT) CAPS capsule [id number] Yes Take 1 capsule (50,000 Units total) by mouth once a week. Lemar Pyles, NP Taking Active             Recommendation:   Follow up with [facility] for grief counseling support - review materials sent on grief counseling  Follow Up Plan:   Follow up call 11/30/2023  Hale Level, LCSW /[facility], [facility] Licensed Clinical Social Worker Care  Coordinator [phone]

## 2023-11-03 ENCOUNTER — Encounter: Payer: Self-pay | Admitting: Oncology

## 2023-11-09 ENCOUNTER — Telehealth: Payer: Self-pay

## 2023-11-09 ENCOUNTER — Other Ambulatory Visit (HOSPITAL_COMMUNITY): Payer: Self-pay

## 2023-11-09 ENCOUNTER — Encounter: Payer: Self-pay | Admitting: Oncology

## 2023-11-09 NOTE — Telephone Encounter (Signed)
 Pharmacy Patient Advocate Encounter   Received notification from CoverMyMeds that prior authorization for Breztri  Aerosphere 160-9-4.8MCG/ACT aerosol is required/requested.   Insurance verification completed.   The patient is insured through [place] [employer] .   Per test claim:  Advair Diskus, Advair HFA, Dulera, or Symbicort  is preferred by the insurance.  If suggested medication is appropriate, Please send in a new RX and discontinue this one. If not, please advise as to why it's not appropriate so that we may request a Prior Authorization. Please note, some preferred medications may still require a PA.  If the suggested medications have not been trialed and there are no contraindications to their use, the PA will not be submitted, as it will not be approved.

## 2023-11-15 NOTE — Telephone Encounter (Signed)
 Advair is not appropriate. She has been on advair before and needs a higher step of medication

## 2023-11-21 MED ORDER — FLUTICASONE-SALMETEROL 250-50 MCG/ACT IN AEPB: 1.0000 | INHALATION_SPRAY | Freq: Two times a day (BID) | RESPIRATORY_TRACT | 12 refills | Status: DC

## 2023-11-21 NOTE — Addendum Note (Signed)
 Addended by: Solomon Dupre on: 11/21/2023 08:35 PM   Modules accepted: Orders

## 2023-11-30 ENCOUNTER — Other Ambulatory Visit: Payer: Self-pay | Admitting: Licensed Clinical Social Worker

## 2023-11-30 NOTE — Patient Instructions (Signed)
 Visit Information  Thank you for taking time to visit with me today. Please don't hesitate to contact me if I can be of assistance to you before our next scheduled appointment.  Your next care management appointment is no further scheduled appointments.   Please call the care guide team at [phone] if you need to cancel, schedule, or reschedule an appointment.   Please call the Suicide and Crisis Lifeline: 988 if you are experiencing a Mental Health or Behavioral Health Crisis or need someone to talk to.  Hale Level, LCSW [place], [facility] Licensed Clinical Social Worker Care Coordinator [phone]

## 2023-11-30 NOTE — Patient Outreach (Signed)
 Complex Care Management   Visit Note  11/30/2023  Name:  Hannah Kaiser MRN: [id number] DOB: [birth year]  Situation: Referral received for Complex Care Management related to Mental/Behavioral Health diagnosis grief I obtained verbal consent from Patient.  Visit completed with patient  on the phone  Background:   Past Medical History:  Diagnosis Date   Anemia    Anxiety    Arthritis    knees, thoracic spurs and arthritis   Asthma    Breast pain    CHF (congestive heart failure) (HCC)    Colon polyps    COPD (chronic obstructive pulmonary disease) (HCC)    Depression    Dysrhythmia    GERD (gastroesophageal reflux disease)    Glaucoma    Heart murmur    History of blood transfusion    Hypertension    Motion sickness    cars   Multilevel degenerative disc disease    Neuropathy    Bilateral arms and legs   Nipple discharge    Numbness of both lower extremities    bulging lumbar discs - per pt   Numbness of upper extremity    bilateral - degenerative cervical discs - per pt   Pneumonia    PONV (postoperative nausea and vomiting)    Sleep apnea    has CPAP   Smokers' cough (HCC)    Swelling of limb 03/11/2021   Wears dentures    full upper and lower    Assessment: Patient Reported Symptoms:  Cognitive Cognitive Status: Alert and oriented to person, place, and time, Normal speech and language skills      Neurological Neurological Review of Symptoms: Not assessed    HEENT HEENT Symptoms Reported: Not assessed      Cardiovascular Cardiovascular Symptoms Reported: Not assessed    Respiratory Respiratory Symptoms Reported: Not assesed    Endocrine Patient reports the following symptoms related to hypoglycemia or hyperglycemia : Not assessed    Gastrointestinal Gastrointestinal Symptoms Reported: Not assessed      Genitourinary Genitourinary Symptoms Reported: Not assessed    Integumentary Integumentary Symptoms Reported: Not assessed    Musculoskeletal  Musculoskelatal Symptoms Reviewed: Not assessed        Psychosocial Psychosocial Symptoms Reported: Depression - if selected complete PHQ 2-9, Anxiety - if selected complete GAD Additional Psychological Details: Recent deaths in the family Behavioral Health Conditions: Depression, Anxiety Behavioral Management Strategies: Activity, Coping strategies, Counseling, Medication therapy, Support system, Librarian, academic Health Self-Management Outcome: 4 (good) Behavioral Health Comment: Pt reports an overall improvement in her mood Major Change/Loss/Stressor/Fears (CP): Death of a loved one, Environment Techniques to [employer] with Loss/Stress/Change: Counseling, Medication Quality of Family Relationships: helpful, involved, supportive Do you feel physically threatened by others?: No      11/30/2023   10:09 AM  Depression screen PHQ 2/9  Decreased Interest 1  Down, Depressed, Hopeless 1  PHQ - 2 Score 2  Altered sleeping 0  Tired, decreased energy 2  Change in appetite 1  Feeling bad or failure about yourself  0  Trouble concentrating 1  Moving slowly or fidgety/restless 0  Suicidal thoughts 0  PHQ-9 Score 6  Difficult doing work/chores Somewhat difficult    There were no vitals filed for this visit.  Medications Reviewed Today     Reviewed by Kit Alm LABOR, LCSW (Social Worker) on 11/30/23 at 1002  Med List Status: <None>   Medication Order Taking? Sig Documenting Provider Last Dose Status Informant  albuterol  (VENTOLIN  HFA) 108 (90  Base) MCG/ACT inhaler [id number] No Inhale 1-2 puffs into the lungs every 6 (six) hours as needed for wheezing or shortness of breath. Vicci Bouchard P, DO Taking Active   amLODipine  (NORVASC ) 10 MG tablet [id number] No Take 1 tablet (10 mg total) by mouth daily. Vicci Bouchard P, DO Taking Active   aspirin  EC 81 MG tablet [id number] No Take 1 tablet (81 mg total) by mouth daily. Swallow whole. Fernand Denyse LABOR, MD Taking Active   atenolol   (TENORMIN ) 50 MG tablet [id number] No Take 1 tablet (50 mg total) by mouth daily. Vicci, Megan P, DO Taking Active   busPIRone  (BUSPAR ) 15 MG tablet [id number] No Take 1 tablet (15 mg total) by mouth 3 (three) times daily. Vicci Bouchard P, DO Taking Active   clonazePAM  (KLONOPIN ) 1 MG tablet [id number] No Take 1 tablet (1 mg total) by mouth 2 (two) times daily as needed. for anxiety Vicci Bouchard P, DO Taking Active   ezetimibe  (ZETIA ) 10 MG tablet [id number] No Take 1 tablet (10 mg total) by mouth daily. Vicci Bouchard P, DO Taking Active   FLUoxetine  (PROZAC ) 40 MG capsule [id number] No Take 2 capsules (80 mg total) by mouth daily. Johnson, Megan P, DO Taking Active   fluticasone -salmeterol (ADVAIR DISKUS) 250-50 MCG/ACT AEPB [id number]  Inhale 1 puff into the lungs in the morning and at bedtime. Johnson, Megan P, DO  Active   furosemide  (LASIX ) 20 MG tablet [id number] No TAKE 1 TABLET BY MOUTH ONCE DAILY AS NEEDED FOR SWELLING Johnson, Megan P, DO Taking Active   hydrOXYzine  (ATARAX ) 25 MG tablet [id number] No Take 1 tablet (25 mg total) by mouth 3 (three) times daily as needed. Vicci Bouchard P, DO Taking Active   ibuprofen  (ADVIL ) 800 MG tablet [id number] No Take 1 tablet (800 mg total) by mouth every 8 (eight) hours as needed. Vicci Bouchard P, DO Taking Active   isosorbide  mononitrate (IMDUR ) 60 MG 24 hr tablet [id number] No Take 1 tablet (60 mg total) by mouth daily. Vicci Bouchard P, DO Taking Active   losartan  (COZAAR ) 100 MG tablet [id number] No Take 1 tablet (100 mg total) by mouth daily. Vicci Bouchard P, DO Taking Active   mupirocin  ointment (BACTROBAN ) 2 % [id number] No Apply 1 application. topically 2 (two) times daily. Maryjane Public, MD Taking Active   nystatin -triamcinolone  ointment [facility]) [id number] No Apply 1 application topically 2 (two) times daily. Melvin Pao, NP Taking Active   ondansetron  (ZOFRAN ) 4 MG tablet [id number] No Take 1 tablet (4 mg total) by mouth every 8 (eight) hours  as needed for nausea or vomiting. Vicci, Megan P, DO Taking Active   pantoprazole  (PROTONIX ) 40 MG tablet [id number] No Take 1 tablet (40 mg total) by mouth 2 (two) times daily. Johnson, Megan P, DO Taking Active   QUEtiapine  (SEROQUEL  XR) 50 MG TB24 24 hr tablet [id number] No Take 1 tablet (50 mg total) by mouth at bedtime. Johnson, Megan P, DO Taking Active   ranolazine  (RANEXA ) 500 MG 12 hr tablet [id number] No TAKE 1 TABLET BY MOUTH TWICE A DAY Scoggins, Amber, NP Taking Active   rosuvastatin  (CRESTOR ) 10 MG tablet [id number] No Take 1 tablet (10 mg total) by mouth every other day. Gollan, Timothy J, MD Taking Active   tiZANidine  (ZANAFLEX ) 4 MG tablet [id number] No Take 1 tablet (4 mg total) by mouth every 8 (eight) hours as needed for muscle spasms. Vicci Bouchard P, DO Taking Active   triamcinolone  ointment (KENALOG ) 0.5 % [id number] No Apply 1 application topically  2 (two) times daily.  Patient not taking: Reported on 10/25/2023   Melvin Pao, NP Not Taking Active   valACYclovir  (VALTREX ) 1000 MG tablet [id number] No Take 1 tablet (1,000 mg total) by mouth daily. Johnson, Megan P, DO Taking Active   Vitamin D , Ergocalciferol , (DRISDOL ) 1.25 MG (50000 UNIT) CAPS capsule [id number] No Take 1 capsule (50,000 Units total) by mouth once a week. Valerio Moris T, NP Taking Active             Recommendation:   11/30/2023 - Pt reports an overall improvement in her mood - pt reports time and finding new daily routines has helped with this. Pt has not reach out to [facility] for Grief Counseling but has their contact information if needed. Pt denied any further needs at this time.  Follow Up Plan:   Patient has met all care management goals. Care Management case will be closed. Patient has been provided contact information should new needs arise.   Alm Armor, LCSW [facility], [facility] Licensed Clinical Social Worker Care Coordinator [phone]

## 2023-12-02 ENCOUNTER — Other Ambulatory Visit (HOSPITAL_COMMUNITY): Payer: Self-pay

## 2023-12-07 ENCOUNTER — Ambulatory Visit: Admitting: Family Medicine

## 2023-12-27 ENCOUNTER — Ambulatory Visit: Admitting: Family Medicine

## 2023-12-27 ENCOUNTER — Encounter: Payer: Self-pay | Admitting: Family Medicine

## 2023-12-27 VITALS — BP 142/83 | HR 58 | Temp 98.1°F | Ht 65.0 in | Wt 248.6 lb

## 2023-12-27 DIAGNOSIS — R0789 Other chest pain: Secondary | ICD-10-CM

## 2023-12-27 DIAGNOSIS — F332 Major depressive disorder, recurrent severe without psychotic features: Secondary | ICD-10-CM

## 2023-12-27 DIAGNOSIS — R221 Localized swelling, mass and lump, neck: Secondary | ICD-10-CM | POA: Diagnosis not present

## 2023-12-27 MED ORDER — HYDROXYZINE HCL 25 MG PO TABS: 25.0000 mg | ORAL_TABLET | Freq: Three times a day (TID) | ORAL | 1 refills | Status: DC | PRN

## 2023-12-27 MED ORDER — QUETIAPINE FUMARATE ER 150 MG PO TB24: 150.0000 mg | ORAL_TABLET | Freq: Every day | ORAL | 0 refills | Status: DC

## 2023-12-27 NOTE — Progress Notes (Signed)
 BP (!) 142/83   Pulse (!) 58   Temp 98.1 F (36.7 C) (Oral)   Ht 5' 5 (1.651 m)   Wt 248 lb 9.6 oz (112.8 kg)   SpO2 96%   BMI 41.37 kg/m    Subjective:    Patient ID: Hannah Kaiser, female    DOB: 19-Feb-1961, 63 y.o.   MRN: [id number]  HPI: Hannah Kaiser is a 63 y.o. female  Chief Complaint  Patient presents with   Shortness of Breath    Occurring on and off for the last month on exertion    Leg Swelling    Onset about a month.    Chest Pain    Occurring on and off for the last month.    CHEST PAIN Duration: about a month Onset: sudden Quality: tightness, pressure Severity: severe Location: substernal, radiates into her upper L chest and into her arm Radiation: left arm Episode duration: 1 minute or so Frequency: with exertion Related to exertion: yes Activity when pain started: walking, cleaning, steps   Trauma: no Anxiety/recent stressors: yes Status: worse Treatments attempted: nothing  Current pain status: pain free Shortness of breath: yes Cough: no Nausea: no Diaphoresis: yes Heartburn: yes Palpitations: no  ANXIETY/DEPRESSION Duration: chronic Status:exacerbated Anxious mood: yes  Excessive worrying: yes Irritability: yes  Sweating: yes Nausea: yes Palpitations:yes Hyperventilation: no Panic attacks: no Agoraphobia: no  Obscessions/compulsions: no Depressed mood: yes    11/30/2023   10:09 AM 10/25/2023    9:27 AM 10/19/2023    8:28 AM 08/24/2023    8:13 AM 06/15/2023   11:31 AM  Depression screen PHQ 2/9  Decreased Interest 1 2 3 2 2   Down, Depressed, Hopeless 1 1 0  1  PHQ - 2 Score 2 3 3 2 3   Altered sleeping 0 0 2 2 3   Tired, decreased energy 2 3 3 3 3   Change in appetite 1 3  3 3   Feeling bad or failure about yourself  0 0 0 1 1  Trouble concentrating 1 2 2 2 1   Moving slowly or fidgety/restless 0 0 0 0 1  Suicidal thoughts 0 0 0 0 0  PHQ-9 Score 6 11 10 13 15   Difficult doing work/chores Somewhat difficult Somewhat difficult  Somewhat difficult Somewhat difficult Somewhat difficult      11/30/2023   10:09 AM 10/25/2023    9:30 AM 10/19/2023    8:29 AM 08/24/2023    8:14 AM  GAD 7 : Generalized Anxiety Score  Nervous, Anxious, on Edge 1 1 1 3   Control/stop worrying 1 1 1 3   Worry too much - different things 0 0 1 1  Trouble relaxing 0 0 0 3  Restless 1 2 3 3   Easily annoyed or irritable 1 3 3 3   Afraid - awful might happen 1 1 1 2   Total GAD 7 Score 5 8 10 18   Anxiety Difficulty Somewhat difficult Somewhat difficult Somewhat difficult Somewhat difficult   Anhedonia: no Weight changes: no Insomnia: no   Hypersomnia: no Fatigue/loss of energy: no Feelings of worthlessness: no Feelings of guilt: no Impaired concentration/indecisiveness: no Suicidal ideations: no  Crying spells: yes Recent Stressors/Life Changes: yes   Relationship problems: no   Family stress: yes     Financial stress: yes    Job stress: no    Recent death/loss: yes   Relevant past medical, surgical, family and social history reviewed and updated as indicated. Interim medical history since our last visit  reviewed. Allergies and medications reviewed and updated.  Review of Systems  Constitutional: Negative.   Respiratory: Negative.    Cardiovascular: Negative.   Musculoskeletal: Negative.   Psychiatric/Behavioral:  Positive for dysphoric mood. Negative for agitation, behavioral problems, confusion, decreased concentration, hallucinations, self-injury, sleep disturbance and suicidal ideas. The patient is nervous/anxious. The patient is not hyperactive.     Per HPI unless specifically indicated above     Objective:    BP (!) 142/83   Pulse (!) 58   Temp 98.1 F (36.7 C) (Oral)   Ht 5' 5 (1.651 m)   Wt 248 lb 9.6 oz (112.8 kg)   SpO2 96%   BMI 41.37 kg/m   Wt Readings from Last 3 Encounters:  12/27/23 248 lb 9.6 oz (112.8 kg)  10/19/23 243 lb 3.2 oz (110.3 kg)  08/24/23 236 lb 6.4 oz (107.2 kg)    Physical  Exam Vitals and nursing note reviewed.  Constitutional:      General: She is not in acute distress.    Appearance: Normal appearance. She is well-developed. She is obese. She is not ill-appearing, toxic-appearing or diaphoretic.  HENT:     Head: Normocephalic and atraumatic.     Right Ear: External ear normal.     Left Ear: External ear normal.     Nose: Nose normal.     Mouth/Throat:     Mouth: Mucous membranes are moist.     Pharynx: Oropharynx is clear.  Eyes:     General: No scleral icterus.       Right eye: No discharge.        Left eye: No discharge.     Extraocular Movements: Extraocular movements intact.     Conjunctiva/sclera: Conjunctivae normal.     Pupils: Pupils are equal, round, and reactive to light.  Cardiovascular:     Rate and Rhythm: Normal rate and regular rhythm.     Pulses: Normal pulses.     Heart sounds: Normal heart sounds. No murmur heard.    No friction rub. No gallop.  Pulmonary:     Effort: Pulmonary effort is normal. No respiratory distress.     Breath sounds: Normal breath sounds. No stridor. No wheezing, rhonchi or rales.  Chest:     Chest wall: No tenderness.  Musculoskeletal:        General: Normal range of motion.     Cervical back: Normal range of motion and neck supple.     Comments: Swelling on the L side of her neck within SCM  Skin:    General: Skin is warm and dry.     Capillary Refill: Capillary refill takes less than 2 seconds.     Coloration: Skin is not jaundiced or pale.     Findings: No bruising, erythema, lesion or rash.  Neurological:     General: No focal deficit present.     Mental Status: She is alert and oriented to person, place, and time. Mental status is at baseline.  Psychiatric:        Mood and Affect: Mood normal.        Behavior: Behavior normal.        Thought Content: Thought content normal.        Judgment: Judgment normal.     Results for orders placed or performed in visit on 10/19/23  Bayer DCA Hb A1c  Waived   Collection Time: 10/19/23  9:25 AM  Result Value Ref Range   HB A1C (BAYER DCA - WAIVED)  5.4 4.8 - 5.6 %  CBC with Differential/Platelet   Collection Time: 10/19/23  9:26 AM  Result Value Ref Range   WBC 5.6 3.4 - 10.8 x10E3/uL   RBC 4.06 3.77 - 5.28 x10E6/uL   Hemoglobin 12.0 11.1 - 15.9 g/dL   Hematocrit 62.9 65.9 - 46.6 %   MCV 91 79 - 97 fL   MCH 29.6 26.6 - 33.0 pg   MCHC 32.4 31.5 - 35.7 g/dL   RDW 86.5 88.2 - 84.5 %   Platelets 221 150 - 450 x10E3/uL   Neutrophils 61 Not Estab. %   Lymphs 28 Not Estab. %   Monocytes 7 Not Estab. %   Eos 3 Not Estab. %   Basos 1 Not Estab. %   Neutrophils Absolute 3.4 1.4 - 7.0 x10E3/uL   Lymphocytes Absolute 1.6 0.7 - 3.1 x10E3/uL   Monocytes Absolute 0.4 0.1 - 0.9 x10E3/uL   EOS (ABSOLUTE) 0.2 0.0 - 0.4 x10E3/uL   Basophils Absolute 0.0 0.0 - 0.2 x10E3/uL   Immature Granulocytes 0 Not Estab. %   Immature Grans (Abs) 0.0 0.0 - 0.1 x10E3/uL  Comprehensive metabolic panel with GFR   Collection Time: 10/19/23  9:26 AM  Result Value Ref Range   Glucose 88 70 - 99 mg/dL   BUN 11 8 - 27 mg/dL   Creatinine, Ser 9.04 0.57 - 1.00 mg/dL   eGFR 68 >40 fO/fpw/8.26   BUN/Creatinine Ratio 12 12 - 28   Sodium 142 134 - 144 mmol/L   Potassium 4.6 3.5 - 5.2 mmol/L   Chloride 108 (H) 96 - 106 mmol/L   CO2 20 20 - 29 mmol/L   Calcium  9.0 8.7 - 10.3 mg/dL   Total Protein 6.2 6.0 - 8.5 g/dL   Albumin 4.3 3.9 - 4.9 g/dL   Globulin, Total 1.9 1.5 - 4.5 g/dL   Bilirubin Total 0.5 0.0 - 1.2 mg/dL   Alkaline Phosphatase 64 44 - 121 IU/L   AST 17 0 - 40 IU/L   ALT 10 0 - 32 IU/L  Lipid Panel w/o Chol/HDL Ratio   Collection Time: 10/19/23  9:26 AM  Result Value Ref Range   Cholesterol, Total 227 (H) 100 - 199 mg/dL   Triglycerides 864 0 - 149 mg/dL   HDL 53 >60 mg/dL   VLDL Cholesterol Cal 24 5 - 40 mg/dL   LDL Chol Calc (NIH) 849 (H) 0 - 99 mg/dL  TSH   Collection Time: 10/19/23  9:26 AM  Result Value Ref Range   TSH 0.785 0.450 -  4.500 uIU/mL  Iron  Binding Cap (TIBC)([facility])   Collection Time: 10/19/23  9:26 AM  Result Value Ref Range   Total Iron  Binding Capacity 353 250 - 450 ug/dL   UIBC 720 881 - 630 ug/dL   Iron  74 27 - 139 ug/dL   Iron  Saturation 21 15 - 55 %  Ferritin   Collection Time: 10/19/23  9:26 AM  Result Value Ref Range   Ferritin 15 15 - 150 ng/mL      Assessment & Plan:   Problem List Items Addressed This Visit       Other   Severe episode of recurrent major depressive disorder, without psychotic features (HCC)   Not doing well. Will increase her seroquel  to 150mg  and recheck in 1 month. Call with any concerns.       Relevant Medications   hydrOXYzine  (ATARAX ) 25 MG tablet   Other Visit Diagnoses       Other  chest pain    -  Primary   EKG normal. Encouraged patient to call cardiology ASAP.   Relevant Orders   EKG 12-Lead   CBC with Differential/Platelet   Comprehensive metabolic panel with GFR   TSH   Hgb A1c w/o eAG     Neck swelling       ?muscle hypertonicity- will check US . Await results.   Relevant Orders   US  Soft Tissue Head/Neck (NON-THYROID)        Follow up plan: Return in about 4 weeks (around 01/24/2024).

## 2023-12-27 NOTE — Assessment & Plan Note (Signed)
 Not doing well. Will increase her seroquel  to 150mg  and recheck in 1 month. Call with any concerns.

## 2023-12-28 ENCOUNTER — Ambulatory Visit: Payer: Self-pay | Admitting: Family Medicine

## 2023-12-28 DIAGNOSIS — R221 Localized swelling, mass and lump, neck: Secondary | ICD-10-CM

## 2023-12-28 LAB — HGB A1C W/O EAG: Hgb A1c MFr Bld: 5.6 % (ref 4.8–5.6)

## 2023-12-28 LAB — CBC WITH DIFFERENTIAL/PLATELET
Basophils Absolute: 0.1 x10E3/uL (ref 0.0–0.2)
Basos: 1 %
EOS (ABSOLUTE): 0.4 x10E3/uL (ref 0.0–0.4)
Eos: 5 %
Hematocrit: 39.2 % (ref 34.0–46.6)
Hemoglobin: 13.1 g/dL (ref 11.1–15.9)
Immature Grans (Abs): 0 x10E3/uL (ref 0.0–0.1)
Immature Granulocytes: 0 %
Lymphocytes Absolute: 2.5 x10E3/uL (ref 0.7–3.1)
Lymphs: 32 %
MCH: 29.8 pg (ref 26.6–33.0)
MCHC: 33.4 g/dL (ref 31.5–35.7)
MCV: 89 fL (ref 79–97)
Monocytes Absolute: 0.6 x10E3/uL (ref 0.1–0.9)
Monocytes: 7 %
Neutrophils Absolute: 4.4 x10E3/uL (ref 1.4–7.0)
Neutrophils: 54 %
Platelets: 271 x10E3/uL (ref 150–450)
RBC: 4.39 x10E6/uL (ref 3.77–5.28)
RDW: 12.4 % (ref 11.7–15.4)
WBC: 8 x10E3/uL (ref 3.4–10.8)

## 2023-12-28 LAB — COMPREHENSIVE METABOLIC PANEL WITH GFR
ALT: 13 IU/L (ref 0–32)
AST: 20 IU/L (ref 0–40)
Albumin: 4.8 g/dL (ref 3.9–4.9)
Alkaline Phosphatase: 70 IU/L (ref 44–121)
BUN/Creatinine Ratio: 14 (ref 12–28)
BUN: 14 mg/dL (ref 8–27)
Bilirubin Total: 0.4 mg/dL (ref 0.0–1.2)
CO2: 21 mmol/L (ref 20–29)
Calcium: 10.2 mg/dL (ref 8.7–10.3)
Chloride: 105 mmol/L (ref 96–106)
Creatinine, Ser: 0.97 mg/dL (ref 0.57–1.00)
Globulin, Total: 1.8 g/dL (ref 1.5–4.5)
Glucose: 88 mg/dL (ref 70–99)
Potassium: 4.2 mmol/L (ref 3.5–5.2)
Sodium: 139 mmol/L (ref 134–144)
Total Protein: 6.6 g/dL (ref 6.0–8.5)
eGFR: 66 mL/min/1.73 (ref >59)

## 2023-12-28 LAB — TSH: TSH: 1.78 u[IU]/mL (ref 0.450–4.500)

## 2024-01-03 ENCOUNTER — Ambulatory Visit
Admission: RE | Admit: 2024-01-03 | Discharge: 2024-01-03 | Disposition: A | Discharge status: Home / Self Care | Source: Ambulatory Visit | Attending: Family Medicine | Admitting: Family Medicine

## 2024-01-03 DIAGNOSIS — R221 Localized swelling, mass and lump, neck: Secondary | ICD-10-CM | POA: Insufficient documentation

## 2024-01-26 ENCOUNTER — Encounter: Payer: Self-pay | Admitting: Oncology

## 2024-01-27 ENCOUNTER — Ambulatory Visit: Admitting: Family Medicine

## 2024-01-27 ENCOUNTER — Encounter: Payer: Self-pay | Admitting: Family Medicine

## 2024-01-27 VITALS — BP 133/73 | HR 55 | Temp 97.9°F | Ht 65.0 in | Wt 250.0 lb

## 2024-01-27 DIAGNOSIS — I25112 Atherosclerotic heart disease of native coronary artery with refractory angina pectoris: Secondary | ICD-10-CM

## 2024-01-27 DIAGNOSIS — Z23 Encounter for immunization: Secondary | ICD-10-CM | POA: Diagnosis not present

## 2024-01-27 DIAGNOSIS — F332 Major depressive disorder, recurrent severe without psychotic features: Secondary | ICD-10-CM

## 2024-01-27 DIAGNOSIS — E782 Mixed hyperlipidemia: Secondary | ICD-10-CM

## 2024-01-27 MED ORDER — WEGOVY 0.25 MG/0.5ML ~~LOC~~ SOAJ
0.2500 mg | SUBCUTANEOUS | 0 refills | Status: DC
Start: 2024-01-27 — End: 2024-03-30

## 2024-01-27 MED ORDER — REPATHA SURECLICK 140 MG/ML ~~LOC~~ SOAJ: 140.0000 mg | SUBCUTANEOUS | 2 refills | Status: DC

## 2024-01-27 MED ORDER — EZETIMIBE 10 MG PO TABS: 10.0000 mg | ORAL_TABLET | Freq: Every day | ORAL | 1 refills | Status: DC

## 2024-01-27 NOTE — Progress Notes (Signed)
 There were no vitals taken for this visit.   Subjective:    Patient ID: Hannah Kaiser, female    DOB: [birth year], 63 y.o.   MRN: [id number]  HPI: Hannah Kaiser is a 63 y.o. female  Chief Complaint  Patient presents with   Depression   ANXIETY/STRESS Duration:{Blank single:19197::controlled,uncontrolled,better,worse,exacerbated,stable} Anxious mood: {Blank single:19197::yes,no}  Excessive worrying: {Blank single:19197::yes,no} Irritability: {Blank single:19197::yes,no}  Sweating: {Blank single:19197::yes,no} Nausea: {Blank single:19197::yes,no} Palpitations:{Blank single:19197::yes,no} Hyperventilation: {Blank single:19197::yes,no} Panic attacks: {Blank single:19197::yes,no} Agoraphobia: {Blank single:19197::yes,no}  Obscessions/compulsions: {Blank single:19197::yes,no} Depressed mood: {Blank single:19197::yes,no}    01/27/2024    1:47 PM 11/30/2023   10:09 AM 10/25/2023    9:27 AM 10/19/2023    8:28 AM 08/24/2023    8:13 AM  Depression screen PHQ 2/9  Decreased Interest 1 1 2 3 2   Down, Depressed, Hopeless 0 1 1 0   PHQ - 2 Score 1 2 3 3 2   Altered sleeping 1 0 0 2 2  Tired, decreased energy 2 2 3 3 3   Change in appetite 2 1 3  3   Feeling bad or failure about yourself  0 0 0 0 1  Trouble concentrating 2 1 2 2 2   Moving slowly or fidgety/restless 0 0 0 0 0  Suicidal thoughts 0 0 0 0 0  PHQ-9 Score 8 6 11 10 13   Difficult doing work/chores Somewhat difficult Somewhat difficult Somewhat difficult Somewhat difficult Somewhat difficult   Anhedonia: {Blank single:19197::yes,no} Weight changes: {Blank single:19197::yes,no} Insomnia: {Blank single:19197::yes,no} {Blank single:19197::hard to fall asleep,hard to stay asleep}  Hypersomnia: {Blank single:19197::yes,no} Fatigue/loss of energy: {Blank single:19197::yes,no} Feelings of worthlessness: {Blank single:19197::yes,no} Feelings of guilt:  {Blank single:19197::yes,no} Impaired concentration/indecisiveness: {Blank single:19197::yes,no} Suicidal ideations: {Blank single:19197::yes,no}  Crying spells: {Blank single:19197::yes,no} Recent Stressors/Life Changes: {Blank single:19197::yes,no}   Relationship problems: {Blank single:19197::yes,no}   Family stress: {Blank single:19197::yes,no}     Financial stress: {Blank single:19197::yes,no}    Job stress: {Blank single:19197::yes,no}    Recent death/loss: {Blank single:19197::yes,no}   Relevant past medical, surgical, family and social history reviewed and updated as indicated. Interim medical history since our last visit reviewed. Allergies and medications reviewed and updated.  Review of Systems  Per HPI unless specifically indicated above     Objective:    There were no vitals taken for this visit.  Wt Readings from Last 3 Encounters:  12/27/23 248 lb 9.6 oz (112.8 kg)  10/19/23 243 lb 3.2 oz (110.3 kg)  08/24/23 236 lb 6.4 oz (107.2 kg)    Physical Exam  Results for orders placed or performed in visit on 12/27/23  CBC with Differential/Platelet   Collection Time: 12/27/23  5:05 PM  Result Value Ref Range   WBC 8.0 3.4 - 10.8 x10E3/uL   RBC 4.39 3.77 - 5.28 x10E6/uL   Hemoglobin 13.1 11.1 - 15.9 g/dL   Hematocrit 60.7 65.9 - 46.6 %   MCV 89 79 - 97 fL   MCH 29.8 26.6 - 33.0 pg   MCHC 33.4 31.5 - 35.7 g/dL   RDW 87.5 88.2 - 84.5 %   Platelets 271 150 - 450 x10E3/uL   Neutrophils 54 Not Estab. %   Lymphs 32 Not Estab. %   Monocytes 7 Not Estab. %   Eos 5 Not Estab. %   Basos 1 Not Estab. %   Neutrophils Absolute 4.4 1.4 - 7.0 x10E3/uL   Lymphocytes Absolute 2.5 0.7 - 3.1 x10E3/uL   Monocytes Absolute 0.6 0.1 - 0.9 x10E3/uL   EOS (ABSOLUTE) 0.4 0.0 -  0.4 x10E3/uL   Basophils Absolute 0.1 0.0 - 0.2 x10E3/uL   Immature Granulocytes 0 Not Estab. %   Immature Grans (Abs) 0.0 0.0 - 0.1 x10E3/uL  Comprehensive metabolic  panel with GFR   Collection Time: 12/27/23  5:05 PM  Result Value Ref Range   Glucose 88 70 - 99 mg/dL   BUN 14 8 - 27 mg/dL   Creatinine, Ser 9.02 0.57 - 1.00 mg/dL   eGFR 66 >40 fO/fpw/8.26   BUN/Creatinine Ratio 14 12 - 28   Sodium 139 134 - 144 mmol/L   Potassium 4.2 3.5 - 5.2 mmol/L   Chloride 105 96 - 106 mmol/L   CO2 21 20 - 29 mmol/L   Calcium  10.2 8.7 - 10.3 mg/dL   Total Protein 6.6 6.0 - 8.5 g/dL   Albumin 4.8 3.9 - 4.9 g/dL   Globulin, Total 1.8 1.5 - 4.5 g/dL   Bilirubin Total 0.4 0.0 - 1.2 mg/dL   Alkaline Phosphatase 70 44 - 121 IU/L   AST 20 0 - 40 IU/L   ALT 13 0 - 32 IU/L  TSH   Collection Time: 12/27/23  5:05 PM  Result Value Ref Range   TSH 1.780 0.450 - 4.500 uIU/mL  Hgb A1c w/o eAG   Collection Time: 12/27/23  5:05 PM  Result Value Ref Range   Hgb A1c MFr Bld 5.6 4.8 - 5.6 %      Assessment & Plan:   Problem List Items Addressed This Visit   None    Follow up plan: No follow-ups on file.

## 2024-01-28 ENCOUNTER — Other Ambulatory Visit: Payer: Self-pay | Admitting: Nurse Practitioner

## 2024-01-28 NOTE — Assessment & Plan Note (Signed)
 Will get her started her on repatha . Recheck in 4-6 weeks. Call with any concerns.

## 2024-01-28 NOTE — Assessment & Plan Note (Signed)
 Not doing well, but given new medicines, will hold on starting something new until we see how she does on them. Recheck in 4-6 weeks.

## 2024-01-28 NOTE — Telephone Encounter (Signed)
 Requested medications are due for refill today.  yes  Requested medications are on the active medications list.  yes  Last refill. 10/12/2023 #4 2 rf  Future visit scheduled.   yes  Notes to clinic.  Provider to review at this dosage. Expired labs.    Requested Prescriptions  Pending Prescriptions Disp Refills   Vitamin D , Ergocalciferol , (DRISDOL ) 1.25 MG (50000 UNIT) CAPS capsule [Pharmacy Med Name: VITAMIN D2 1.25MG (50,000 UNIT)] 12 capsule     Sig: TAKE 1 CAPSULE BY MOUTH ONE TIME PER WEEK     Endocrinology:  Vitamins - Vitamin D  Supplementation 2 Failed - 01/28/2024  4:15 PM      Failed - Manual Review: Route requests for 50,000 IU strength to the provider      Failed - Vitamin D  in normal range and within 360 days    Vit D, 25-Hydroxy  Date Value Ref Range Status  03/25/2022 21.8 (L) 30.0 - 100.0 ng/mL Final    Comment:    Vitamin D  deficiency has been defined by the Institute of Medicine and an Endocrine Society practice guideline as a level of serum 25-OH vitamin D  less than 20 ng/mL (1,2). The Endocrine Society went on to further define vitamin D  insufficiency as a level between 21 and 29 ng/mL (2). 1. IOM (Institute of Medicine). 2010. Dietary reference    intakes for calcium  and D. [place]: The    [employer]. 2. Holick MF, Binkley Canute, Bischoff-Ferrari HA, et al.    Evaluation, treatment, and prevention of vitamin D     deficiency: an Endocrine Society clinical practice    guideline. JCEM. 2011 Jul; 96(7):1911-30.          Passed - Ca in normal range and within 360 days    Calcium   Date Value Ref Range Status  12/27/2023 10.2 8.7 - 10.3 mg/dL Final   Calcium , Total  Date Value Ref Range Status  08/30/2013 9.1 8.5 - 10.1 mg/dL Final         Passed - Valid encounter within last 12 months    Recent Outpatient Visits           Yesterday Severe episode of recurrent major depressive disorder, without psychotic features [facility])   [place]  [facility] [place], Megan P, DO   1 month ago Other chest pain   [place] [facility] [place], [place], DO   3 months ago Anxiety   [place] [facility] [place], Megan P, DO   5 months ago Primary osteoarthritis of both knees   [place] [facility] Herold Hadassah SQUIBB, MD       Future Appointments             In 4 days Fernand Denyse LABOR, MD [facility]

## 2024-01-28 NOTE — Assessment & Plan Note (Addendum)
 Would benefit from wegovy  for cardioprotection and weight loss. Will start. Recheck tolerance in 6 weeks. Call with any concerns.

## 2024-01-28 NOTE — Assessment & Plan Note (Signed)
 Due to HTN, HLD, CAD, GERD, back pain. Would benefit from wegovy  for cardioprotection and weight loss. Will start. Recheck tolerance in 6 weeks. Call with any concerns.

## 2024-02-01 ENCOUNTER — Ambulatory Visit: Admitting: Cardiovascular Disease

## 2024-02-01 ENCOUNTER — Encounter: Payer: Self-pay | Admitting: Oncology

## 2024-02-01 ENCOUNTER — Encounter: Payer: Self-pay | Admitting: Cardiovascular Disease

## 2024-02-01 VITALS — BP 132/70 | HR 67 | Ht 65.0 in | Wt 250.0 lb

## 2024-02-01 DIAGNOSIS — R0602 Shortness of breath: Secondary | ICD-10-CM | POA: Diagnosis not present

## 2024-02-01 DIAGNOSIS — R609 Edema, unspecified: Secondary | ICD-10-CM

## 2024-02-01 DIAGNOSIS — I25118 Atherosclerotic heart disease of native coronary artery with other forms of angina pectoris: Secondary | ICD-10-CM | POA: Diagnosis not present

## 2024-02-01 DIAGNOSIS — R0789 Other chest pain: Secondary | ICD-10-CM

## 2024-02-01 DIAGNOSIS — F17210 Nicotine dependence, cigarettes, uncomplicated: Secondary | ICD-10-CM | POA: Diagnosis not present

## 2024-02-01 DIAGNOSIS — I5033 Acute on chronic diastolic (congestive) heart failure: Secondary | ICD-10-CM

## 2024-02-01 NOTE — Progress Notes (Signed)
 Cardiology Office Note   Date:  02/01/2024   ID:  Hannah Kaiser, Hannah Kaiser 01/03/1961, MRN [id number]  PCP:  Vicci Duwaine SQUIBB, DO  Cardiologist:  Denyse Bathe, MD      History of Present Illness: Hannah Kaiser is a 63 y.o. female who presents for  Chief Complaint  Patient presents with   Follow-up    2 Months Follow up    Has swelling of legs, SOB, and stabbing pains since last night. Patient went to ER in [facility] with chest pain and r/o for MI and was treated with IV lasix . She had chest pains 1/25, but was not able to get echo, stress test as had no insurance. Now has insurance.      Past Medical History:  Diagnosis Date   Anemia    Anxiety    Arthritis    knees, thoracic spurs and arthritis   Asthma    Breast pain    CHF (congestive heart failure) (HCC)    Colon polyps    COPD (chronic obstructive pulmonary disease) (HCC)    Depression    Dysrhythmia    GERD (gastroesophageal reflux disease)    Glaucoma    Heart murmur    History of blood transfusion    Hypertension    Motion sickness    cars   Multilevel degenerative disc disease    Neuropathy    Bilateral arms and legs   Nipple discharge    Numbness of both lower extremities    bulging lumbar discs - per pt   Numbness of upper extremity    bilateral - degenerative cervical discs - per pt   Pneumonia    PONV (postoperative nausea and vomiting)    Sleep apnea    has CPAP   Smokers' cough (HCC)    Swelling of limb 03/11/2021   Wears dentures    full upper and lower     Past Surgical History:  Procedure Laterality Date   ABDOMINAL HYSTERECTOMY     BACK SURGERY     BELPHAROPTOSIS REPAIR     BROW LIFT Bilateral 03/24/2016   Procedure: BLEPHAROPLASTY;  Surgeon: Greig CHRISTELLA Gay, MD;  Location: [facility];  Service: Ophthalmology;  Laterality: Bilateral;  sleep apnea   CARDIAC CATHETERIZATION     COLONOSCOPY WITH PROPOFOL  N/A 10/19/2016   Procedure: COLONOSCOPY WITH PROPOFOL ;  Surgeon: Jinny Carmine, MD;  Location: [facility];  Service: Endoscopy;  Laterality: N/A;   COLONOSCOPY WITH PROPOFOL  N/A 05/12/2022   Procedure: COLONOSCOPY WITH PROPOFOL ;  Surgeon: Jinny Carmine, MD;  Location: [facility] ENDOSCOPY;  Service: Endoscopy;  Laterality: N/A;   DILATION AND CURETTAGE OF UTERUS     ECTOPIC PREGNANCY SURGERY     x2   ESOPHAGOGASTRODUODENOSCOPY (EGD) WITH PROPOFOL  N/A 10/19/2016   Procedure: ESOPHAGOGASTRODUODENOSCOPY (EGD) WITH PROPOFOL ;  Surgeon: Jinny Carmine, MD;  Location: [facility];  Service: Endoscopy;  Laterality: N/A;   LEFT HEART CATH AND CORONARY ANGIOGRAPHY Left 12/29/2016   Procedure: Left Heart Cath and Coronary Angiography;  Surgeon: Bathe Denyse LABOR, MD;  Location: [facility] INVASIVE CV LAB;  Service: Cardiovascular;  Laterality: Left;   NECK SURGERY  2003   POLYPECTOMY  10/19/2016   Procedure: POLYPECTOMY;  Surgeon: Jinny Carmine, MD;  Location: [facility];  Service: Endoscopy;;   SPINE SURGERY  1989, 1998   TOTAL ABDOMINAL HYSTERECTOMY W/ BILATERAL SALPINGOOPHORECTOMY  2014   TUBAL LIGATION       Current Outpatient Medications  Medication Sig Dispense  Refill   albuterol  (VENTOLIN  HFA) 108 (90 Base) MCG/ACT inhaler Inhale 1-2 puffs into the lungs every 6 (six) hours as needed for wheezing or shortness of breath. 18 each 1   amLODipine  (NORVASC ) 10 MG tablet Take 1 tablet (10 mg total) by mouth daily. 90 tablet 1   aspirin  EC 81 MG tablet Take 1 tablet (81 mg total) by mouth daily. Swallow whole. 30 tablet 12   atenolol  (TENORMIN ) 50 MG tablet Take 1 tablet (50 mg total) by mouth daily. 90 tablet 1   busPIRone  (BUSPAR ) 15 MG tablet Take 1 tablet (15 mg total) by mouth 3 (three) times daily. 270 tablet 1   clonazePAM  (KLONOPIN ) 1 MG tablet Take 1 tablet (1 mg total) by mouth 2 (two) times daily as needed. for anxiety 60 tablet 0   Evolocumab  (REPATHA  SURECLICK) 140 MG/ML SOAJ Inject 140 mg into the skin every 14 (fourteen) days. 2 mL 2   ezetimibe   (ZETIA ) 10 MG tablet Take 1 tablet (10 mg total) by mouth daily. 90 tablet 1   FLUoxetine  (PROZAC ) 40 MG capsule Take 2 capsules (80 mg total) by mouth daily. 180 capsule 1   furosemide  (LASIX ) 20 MG tablet TAKE 1 TABLET BY MOUTH ONCE DAILY AS NEEDED FOR SWELLING 60 tablet 3   hydrOXYzine  (ATARAX ) 25 MG tablet Take 1 tablet (25 mg total) by mouth 3 (three) times daily as needed. 270 tablet 1   ibuprofen  (ADVIL ) 800 MG tablet Take 1 tablet (800 mg total) by mouth every 8 (eight) hours as needed. 90 tablet 0   isosorbide  mononitrate (IMDUR ) 60 MG 24 hr tablet Take 1 tablet (60 mg total) by mouth daily. 90 tablet 1   losartan  (COZAAR ) 100 MG tablet Take 1 tablet (100 mg total) by mouth daily. 90 tablet 1   mupirocin  ointment (BACTROBAN ) 2 % Apply 1 application. topically 2 (two) times daily. 22 g 0   nystatin -triamcinolone  ointment (MYCOLOG) Apply 1 application topically 2 (two) times daily. 30 g 1   ondansetron  (ZOFRAN ) 4 MG tablet Take 1 tablet (4 mg total) by mouth every 8 (eight) hours as needed for nausea or vomiting. 60 tablet 3   pantoprazole  (PROTONIX ) 40 MG tablet Take 1 tablet (40 mg total) by mouth 2 (two) times daily. 180 tablet 1   QUEtiapine  Fumarate (SEROQUEL  XR) 150 MG 24 hr tablet Take 1 tablet (150 mg total) by mouth at bedtime. 90 tablet 0   ranolazine  (RANEXA ) 500 MG 12 hr tablet TAKE 1 TABLET BY MOUTH TWICE A DAY 180 tablet 0   semaglutide -weight management (WEGOVY ) 0.25 MG/0.5ML SOAJ SQ injection Inject 0.25 mg into the skin once a week. 2 mL 0   tiZANidine  (ZANAFLEX ) 4 MG tablet Take 1 tablet (4 mg total) by mouth every 8 (eight) hours as needed for muscle spasms. 90 tablet 1   triamcinolone  ointment (KENALOG ) 0.5 % Apply 1 application topically 2 (two) times daily. 30 g 0   valACYclovir  (VALTREX ) 1000 MG tablet Take 1 tablet (1,000 mg total) by mouth daily. 180 tablet 1   Vitamin D , Ergocalciferol , (DRISDOL ) 1.25 MG (50000 UNIT) CAPS capsule Take 1 capsule (50,000 Units total)  by mouth once a week. 4 capsule 2   No current facility-administered medications for this visit.    Allergies:   Aripiprazole , Bupropion, Ace inhibitors, Paxlovid [nirmatrelvir-ritonavir], Statins, Azithromycin, and Erythromycin    Social History:   reports that she has been smoking cigarettes. She has a 61.5 pack-year smoking history. She has never used  smokeless tobacco. She reports that she does not drink alcohol and does not use drugs.   Family History:  family history includes Cancer in her brother, maternal grandfather, and maternal uncle.    ROS:     Review of Systems  Constitutional: Negative.   HENT: Negative.    Eyes: Negative.   Respiratory: Negative.    Gastrointestinal: Negative.   Genitourinary: Negative.   Musculoskeletal: Negative.   Skin: Negative.   Neurological: Negative.   Endo/Heme/Allergies: Negative.   Psychiatric/Behavioral: Negative.    All other systems reviewed and are negative.     All other systems are reviewed and negative.    PHYSICAL EXAM: VS:  BP 132/70   Pulse 67   Ht 5' 5 (1.651 m)   Wt 250 lb (113.4 kg)   SpO2 95%   BMI 41.60 kg/m  , BMI Body mass index is 41.6 kg/m. Last weight:  Wt Readings from Last 3 Encounters:  02/01/24 250 lb (113.4 kg)  01/27/24 250 lb (113.4 kg)  12/27/23 248 lb 9.6 oz (112.8 kg)     Physical Exam Constitutional:      Appearance: Normal appearance.  Cardiovascular:     Rate and Rhythm: Normal rate and regular rhythm.     Heart sounds: Normal heart sounds.  Pulmonary:     Effort: Pulmonary effort is normal.     Breath sounds: Normal breath sounds.  Musculoskeletal:     Right lower leg: No edema.     Left lower leg: No edema.  Neurological:     Mental Status: She is alert.       EKG:  EKG 01/06/24 in ER no acute changes Recent Labs: 12/27/2023: ALT 13; BUN 14; Creatinine, Ser 0.97; Hemoglobin 13.1; Platelets 271; Potassium 4.2; Sodium 139; TSH 1.780    Lipid Panel    Component Value  Date/Time   CHOL 227 (H) 10/19/2023 0926   TRIG 135 10/19/2023 0926   HDL 53 10/19/2023 0926   CHOLHDL 4.7 (H) 08/30/2020 0904   LDLCALC 150 (H) 10/19/2023 9073      Other studies Reviewed: Additional studies/ records that were reviewed today include:  Review of the above records demonstrates:       No data to display            ASSESSMENT AND PLAN:    ICD-10-CM   1. Coronary artery disease of native artery of native heart with stable angina pectoris (HCC)  I25.118 PCV ECHOCARDIOGRAM COMPLETE    MYOCARDIAL PERFUSION IMAGING    2. Nicotine dependence, cigarettes, uncomplicated  F17.210 PCV ECHOCARDIOGRAM COMPLETE    MYOCARDIAL PERFUSION IMAGING    3. Other chest pain  R07.89 PCV ECHOCARDIOGRAM COMPLETE    MYOCARDIAL PERFUSION IMAGING    4. SOB (shortness of breath)  R06.02 PCV ECHOCARDIOGRAM COMPLETE    MYOCARDIAL PERFUSION IMAGING    5. CHF (congestive heart failure), NYHA class III, acute on chronic, diastolic (HCC)  I50.33 PCV ECHOCARDIOGRAM COMPLETE    MYOCARDIAL PERFUSION IMAGING    6. Edema, unspecified type  R60.9 PCV ECHOCARDIOGRAM COMPLETE    MYOCARDIAL PERFUSION IMAGING       Problem List Items Addressed This Visit       Cardiovascular and Mediastinum   CAD (coronary artery disease) - Primary   Relevant Orders   PCV ECHOCARDIOGRAM COMPLETE   MYOCARDIAL PERFUSION IMAGING     Other   Nicotine dependence, cigarettes, uncomplicated   Relevant Orders   PCV ECHOCARDIOGRAM COMPLETE   MYOCARDIAL PERFUSION IMAGING  Other Visit Diagnoses       Other chest pain       Relevant Orders   PCV ECHOCARDIOGRAM COMPLETE   MYOCARDIAL PERFUSION IMAGING     SOB (shortness of breath)       Relevant Orders   PCV ECHOCARDIOGRAM COMPLETE   MYOCARDIAL PERFUSION IMAGING     CHF (congestive heart failure), NYHA class III, acute on chronic, diastolic (HCC)       Relevant Orders   PCV ECHOCARDIOGRAM COMPLETE   MYOCARDIAL PERFUSION IMAGING     Edema, unspecified  type       Relevant Orders   PCV ECHOCARDIOGRAM COMPLETE   MYOCARDIAL PERFUSION IMAGING          Disposition:   No follow-ups on file.    Total time spent: 40 minutes  Signed,  Denyse Bathe, MD  02/01/2024 10:46 AM    [facility]

## 2024-02-02 ENCOUNTER — Telehealth: Payer: Self-pay

## 2024-02-02 NOTE — Progress Notes (Signed)
 Care Guide Pharmacy Note  02/02/2024 Name: Hannah Kaiser MRN: [id number] DOB: 01/12/1961  Referred By: Vicci Duwaine SQUIBB, DO Reason for referral: Complex Care Management (Outreach to schedule with Pharm d )   [name] is a 63 y.o. year old female who is a primary care patient of Vicci Duwaine SQUIBB, DO.  [name] was referred to the pharmacist for assistance related to: CAD and HLD  Successful contact was made with the patient to discuss pharmacy services including being ready for the pharmacist to call at least 5 minutes before the scheduled appointment time and to have medication bottles and any blood pressure readings ready for review. The patient agreed to meet with the pharmacist via telephone visit on (date/time).02/14/2024  Jeoffrey Buffalo , RMA       [facility] Guide  Direct Dial: [phone]  Website: [email or web address]

## 2024-02-11 ENCOUNTER — Telehealth: Payer: Self-pay

## 2024-02-11 ENCOUNTER — Other Ambulatory Visit: Payer: Self-pay

## 2024-02-11 DIAGNOSIS — Z122 Encounter for screening for malignant neoplasm of respiratory organs: Secondary | ICD-10-CM

## 2024-02-11 DIAGNOSIS — Z87891 Personal history of nicotine dependence: Secondary | ICD-10-CM

## 2024-02-11 DIAGNOSIS — F17210 Nicotine dependence, cigarettes, uncomplicated: Secondary | ICD-10-CM

## 2024-02-11 NOTE — Telephone Encounter (Signed)
 Copied from CRM [id number]. Topic: Clinical - Request for Lab/Test Order >> Feb 11, 2024 11:27 AM Corean SAUNDERS wrote: Reason for CRM: Patient states she received a latter stating that its time for her complete a CT with [facility] but there is no  order in patients chart, please call patient back and advise.  Please advise.

## 2024-02-11 NOTE — Telephone Encounter (Signed)
 Returned call. Annual Lung CT scheduled for 02/22/2024 at [facility]. New order placed and linked to appt.

## 2024-02-14 ENCOUNTER — Other Ambulatory Visit: Payer: Self-pay

## 2024-02-14 NOTE — Progress Notes (Signed)
   02/14/2024  Patient ID: Hannah Kaiser, female   DOB: [birth year], 63 y.o.   MRN: [id number]  PA's for Breztri , Repatha , and Wegovy  have been approved per CoverMyMeds.  Contacted [facility] to inform, and all 3 medications are going through on Medicaid for $4 copays each.  Medications will be ready for pick up tomorrow after 3pm.  Contacted patient to let her know and counsel on storage and administration of medications, as they are all new for her.  Patient endorses understanding and does not have questions at this time, but I have provided my direct phone number in case anything comes up once meds are picked up.  Patient has a follow-up with PCP 9/22 and with me 10/20.   Channing DELENA Mealing, PharmD, DPLA

## 2024-02-14 NOTE — Progress Notes (Signed)
 02/14/2024 Name: ANNMARGARET DECAPRIO MRN: [id number] DOB: [birth year]  Hannah Kaiser is a 63 y.o. year old female who presented for a telephone visit.   They were referred to the pharmacist by their PCP for assistance in managing medication access.   Subjective:  Care Team: Primary Care Provider: Vicci Duwaine SQUIBB, DO ; Next Scheduled Visit: 02/28/2024  Medication Access/Adherence  Current Pharmacy:  [phone] [address] [place] - [address] [place] [address] Phone: [phone] Fax: [phone]  -Patient reports affordability concerns with their medications: Yes  -Patient reports access/transportation concerns to their pharmacy: No  -Patient reports adherence concerns with their medications:  Yes    Hyperlipidemia/ASCVD Risk Reduction Current lipid lowering medications: ezetimibe  10mg  daily Medications tried in the past: atorvastatin  and rosuvastatin  cause myalgias Antiplatelet regimen: ASA 81mg  daily -Patient diagnosed with CAD in need of additional LDL lowering.  PCP has prescribed Repatha , but patient has not been able to start medication due to insurance not covering.  Obesity/Overweight Current medications: recently prescribed Wegovy  based on obesity, OSA, and ASCVD; but patient has not started medication.  She has not been able to pick up, likely due to [facility] requiring a prior authorization. -BMI 8/26 of 41.6  COPD: Current medications: albuterol  rescue inhaler Medications tried in the past: Symbicort  and Advair -No noted exacerbations in the last year, but patient does not have daily maintenance inhaler on hand; states Breztri  was prescribed but insurance did not cover.  Only has albuterol  inhaler on hand for rescue, and does endorse frequent SOB.  Objective:  Lab Results  Component Value Date   CREATININE 0.97 12/27/2023   BUN 14 12/27/2023   NA 139 12/27/2023   K 4.2 12/27/2023   CL 105 12/27/2023   CO2 21 12/27/2023   Lab Results   Component Value Date   CHOL 227 (H) 10/19/2023   HDL 53 10/19/2023   LDLCALC 150 (H) 10/19/2023   TRIG 135 10/19/2023   CHOLHDL 4.7 (H) 08/30/2020   Medications Reviewed Today     Reviewed by Deanna Channing LABOR, RPH (Pharmacist) on 02/14/24 at 0845  Med List Status: <None>   Medication Order Taking? Sig Documenting Provider Last Dose Status Informant  albuterol  (VENTOLIN  HFA) 108 (90 Base) MCG/ACT inhaler [id number] Yes Inhale 1-2 puffs into the lungs every 6 (six) hours as needed for wheezing or shortness of breath. Johnson, Megan P, DO  Active   amLODipine  (NORVASC ) 10 MG tablet [id number] Yes Take 1 tablet (10 mg total) by mouth daily. Vicci Duwaine P, DO  Active   aspirin  EC 81 MG tablet [id number] Yes Take 1 tablet (81 mg total) by mouth daily. Swallow whole. Fernand Denyse LABOR, MD  Active   atenolol  (TENORMIN ) 50 MG tablet [id number] Yes Take 1 tablet (50 mg total) by mouth daily. Johnson, Megan P, DO  Active   busPIRone  (BUSPAR ) 15 MG tablet [id number] Yes Take 1 tablet (15 mg total) by mouth 3 (three) times daily. Vicci, Megan P, DO  Active   clonazePAM  (KLONOPIN ) 1 MG tablet [id number] Yes Take 1 tablet (1 mg total) by mouth 2 (two) times daily as needed. for anxiety Vicci, Megan P, DO  Active   Evolocumab  (REPATHA  SURECLICK) 140 MG/ML SOAJ [id number]  Inject 140 mg into the skin every 14 (fourteen) days.  Patient not taking: Reported on 02/14/2024   Vicci Duwaine SQUIBB, DO  Active   ezetimibe  (ZETIA ) 10 MG tablet [id number] Yes Take 1 tablet (10 mg total)  by mouth daily. Vicci, Megan P, DO  Active   FLUoxetine  (PROZAC ) 40 MG capsule [id number] Yes Take 2 capsules (80 mg total) by mouth daily. Johnson, Megan P, DO  Active   furosemide  (LASIX ) 20 MG tablet [id number] Yes TAKE 1 TABLET BY MOUTH ONCE DAILY AS NEEDED FOR SWELLING  Patient taking differently: Take 20 mg by mouth daily as needed.   Johnson, Megan P, DO  Active   hydrOXYzine  (ATARAX ) 25 MG tablet [id number] Yes Take 1 tablet (25 mg  total) by mouth 3 (three) times daily as needed. Johnson, Megan P, DO  Active   ibuprofen  (ADVIL ) 800 MG tablet [id number] Yes Take 1 tablet (800 mg total) by mouth every 8 (eight) hours as needed. Johnson, Megan P, DO  Active   isosorbide  mononitrate (IMDUR ) 60 MG 24 hr tablet [id number] Yes Take 1 tablet (60 mg total) by mouth daily. Vicci, Megan P, DO  Active   losartan  (COZAAR ) 100 MG tablet [id number] Yes Take 1 tablet (100 mg total) by mouth daily. Vicci, Megan P, DO  Active   mupirocin  ointment (BACTROBAN ) 2 % [id number]  Apply 1 application. topically 2 (two) times daily. Maryjane Public, MD  Active   nystatin -triamcinolone  ointment [facility]) [id number]  Apply 1 application topically 2 (two) times daily. Melvin Pao, NP  Active   ondansetron  (ZOFRAN ) 4 MG tablet [id number] Yes Take 1 tablet (4 mg total) by mouth every 8 (eight) hours as needed for nausea or vomiting. Vicci, Megan P, DO  Active   pantoprazole  (PROTONIX ) 40 MG tablet [id number] Yes Take 1 tablet (40 mg total) by mouth 2 (two) times daily. Johnson, Megan P, DO  Active   QUEtiapine  Fumarate (SEROQUEL  XR) 150 MG 24 hr tablet [id number] Yes Take 1 tablet (150 mg total) by mouth at bedtime.  Patient taking differently: Take 50 mg by mouth at bedtime.   Johnson, Megan P, DO  Active   ranolazine  (RANEXA ) 500 MG 12 hr tablet [id number]  TAKE 1 TABLET BY MOUTH TWICE A DAY Scoggins, Amber, NP  Active   semaglutide -weight management (WEGOVY ) 0.25 MG/0.5ML SOAJ SQ injection [id number]  Inject 0.25 mg into the skin once a week.  Patient not taking: Reported on 02/14/2024   Vicci Bouchard P, DO  Active   tiZANidine  (ZANAFLEX ) 4 MG tablet [id number] Yes Take 1 tablet (4 mg total) by mouth every 8 (eight) hours as needed for muscle spasms. Vicci, Megan P, DO  Active   triamcinolone  ointment (KENALOG ) 0.5 % [id number]  Apply 1 application topically 2 (two) times daily. Melvin Pao, NP  Active   valACYclovir  (VALTREX ) 1000 MG tablet  [id number]  Take 1 tablet (1,000 mg total) by mouth daily. Johnson, Megan P, DO  Active   Vitamin D , Ergocalciferol , (DRISDOL ) 1.25 MG (50000 UNIT) CAPS capsule [id number] Yes TAKE 1 CAPSULE BY MOUTH ONE TIME PER WEEK Johnson, Megan P, DO  Active            Assessment/Plan:   Hyperlipidemia/ASCVD Risk Reduction: -Currently uncontrolled.  -Prior authorization request sent to insurance for Repatha  140mg  every 14 days via CoverMyMeds (KEY [id number])  Obesity/Overweight: -Currently unable to achieve goal weight loss of 5-10% through diet and lifestyle modifications alone -No CI's identified in regard to initiation of Wegovy  pharmacotherapy -Prior authorization request submitted to insurance for Wegovy  0.25mg  weekly via CoverMyMeds (KEY [id number])  COPD: -Currently uncontrolled.  -Prior authorization request submitted to insurance for Breztri  via CoverMyMeds (KEY [id number])  Follow Up Plan: will monitor status of prior authorizations and  keep patient and provider informed.  Once approved, I will schedule a 2-3 week follow-up to check in with patient on tolerance/efficacy of medication(s).  Channing DELENA Mealing, PharmD, DPLA

## 2024-02-14 NOTE — Addendum Note (Signed)
 Addended by: DEANNA ROSELLA A on: 02/14/2024 12:26 PM   Modules accepted: Orders

## 2024-02-18 ENCOUNTER — Encounter: Payer: Self-pay | Admitting: Oncology

## 2024-02-21 ENCOUNTER — Ambulatory Visit

## 2024-02-21 DIAGNOSIS — R0602 Shortness of breath: Secondary | ICD-10-CM

## 2024-02-21 DIAGNOSIS — I5033 Acute on chronic diastolic (congestive) heart failure: Secondary | ICD-10-CM

## 2024-02-21 DIAGNOSIS — R0789 Other chest pain: Secondary | ICD-10-CM

## 2024-02-21 DIAGNOSIS — I25118 Atherosclerotic heart disease of native coronary artery with other forms of angina pectoris: Secondary | ICD-10-CM

## 2024-02-21 DIAGNOSIS — F17210 Nicotine dependence, cigarettes, uncomplicated: Secondary | ICD-10-CM

## 2024-02-21 DIAGNOSIS — R609 Edema, unspecified: Secondary | ICD-10-CM

## 2024-02-21 MED ORDER — TECHNETIUM TC 99M SESTAMIBI GENERIC - CARDIOLITE
32.3000 | Freq: Once | INTRAVENOUS | Status: AC | PRN
  Administered 2024-02-21: 32.3 via INTRAVENOUS

## 2024-02-21 MED ORDER — TECHNETIUM TC 99M SESTAMIBI GENERIC - CARDIOLITE
10.5000 | Freq: Once | INTRAVENOUS | Status: AC | PRN
  Administered 2024-02-21: 10.5 via INTRAVENOUS

## 2024-02-22 ENCOUNTER — Ambulatory Visit
Admission: RE | Admit: 2024-02-22 | Discharge: 2024-02-22 | Disposition: A | Discharge status: Home / Self Care | Source: Ambulatory Visit | Attending: Acute Care | Admitting: Acute Care

## 2024-02-22 DIAGNOSIS — F17210 Nicotine dependence, cigarettes, uncomplicated: Secondary | ICD-10-CM | POA: Diagnosis present

## 2024-02-22 DIAGNOSIS — Z122 Encounter for screening for malignant neoplasm of respiratory organs: Secondary | ICD-10-CM | POA: Diagnosis present

## 2024-02-22 DIAGNOSIS — Z87891 Personal history of nicotine dependence: Secondary | ICD-10-CM | POA: Diagnosis present

## 2024-02-28 ENCOUNTER — Encounter: Payer: Self-pay | Admitting: Family Medicine

## 2024-02-28 ENCOUNTER — Ambulatory Visit: Admitting: Family Medicine

## 2024-02-28 VITALS — BP 129/79 | HR 60 | Temp 97.9°F | Wt 254.0 lb

## 2024-02-28 DIAGNOSIS — E870 Hyperosmolality and hypernatremia: Secondary | ICD-10-CM

## 2024-02-28 DIAGNOSIS — G4733 Obstructive sleep apnea (adult) (pediatric): Secondary | ICD-10-CM | POA: Diagnosis not present

## 2024-02-28 DIAGNOSIS — Z23 Encounter for immunization: Secondary | ICD-10-CM | POA: Diagnosis not present

## 2024-02-28 DIAGNOSIS — I25118 Atherosclerotic heart disease of native coronary artery with other forms of angina pectoris: Secondary | ICD-10-CM

## 2024-02-28 DIAGNOSIS — F332 Major depressive disorder, recurrent severe without psychotic features: Secondary | ICD-10-CM

## 2024-02-28 DIAGNOSIS — R5382 Chronic fatigue, unspecified: Secondary | ICD-10-CM

## 2024-02-28 MED ORDER — FUROSEMIDE 20 MG PO TABS: ORAL_TABLET | ORAL | 3 refills | Status: DC

## 2024-02-28 MED ORDER — ZEPBOUND 5 MG/0.5ML ~~LOC~~ SOAJ: 5.0000 mg | SUBCUTANEOUS | 0 refills | Status: DC

## 2024-02-28 NOTE — Assessment & Plan Note (Signed)
 Normal stress test last week. To have ECHO done on Wednesday. Await results Treat as needed.

## 2024-02-28 NOTE — Assessment & Plan Note (Signed)
 Severe OSA on last sleep study. Not using her CPAP- needs new sleep study for titration. Will also change her from wegovy  to zepbound  to help with OSA. Call with any concerns. Recheck about 6 weeks.

## 2024-02-28 NOTE — Progress Notes (Signed)
 BP 129/79 (BP Location: Left Arm, Patient Position: Sitting, Cuff Size: Large)   Pulse 60   Temp 97.9 F (36.6 C)   Wt 254 lb (115.2 kg)   SpO2 97%   BMI 42.27 kg/m    Subjective:    Patient ID: Hannah Kaiser, female    DOB: 1960/12/12, 63 y.o.   MRN: [id number]  HPI: Hannah Kaiser is a 63 y.o. female  Chief Complaint  Patient presents with   Follow-up    Pt presents for a 4 week f/u. Wants to discuss results from her lab work.   OBESITY- started her wegovy  yesterday Duration: chronic Previous attempts at weight loss: yes- portion control, diet, exercise Complications of obesity: IFG, CAD, HTN, HLD, GERD, back pain Peak weight: current (254) Weight loss goal: to be healthy Weight loss to date: none Requesting obesity pharmacotherapy: yes Current weight loss supplements/medications: yes Previous weight loss supplements/meds: no  SLEEP APNEA Sleep apnea status: uncontrolled Duration: chronic Satisfied with current treatment?:  no CPAP use:  no Sleep quality with CPAP use: not using Treament compliance:poor compliance Last sleep study: 2017 Treatments attempted: CPAP Wakes feeling refreshed:  no Daytime hypersomnolence:  yes Fatigue:  yes Insomnia:  yes Good sleep hygiene:  yes Difficulty falling asleep:  no Difficulty staying asleep:  yes Snoring bothers bed partner:  yes Observed apnea by bed partner: yes Obesity:  yes Hypertension: yes  Pulmonary hypertension:  no Coronary artery disease:  no   DEPRESSION Mood status: stable Satisfied with current treatment?: yes Symptom severity: moderate  Duration of current treatment : chronic Side effects: no Medication compliance: excellent compliance Psychotherapy/counseling: no  Depressed mood: yes Anxious mood: no Anhedonia: yes Significant weight loss or gain: no Insomnia: yes  Fatigue: yes Feelings of worthlessness or guilt: no Impaired concentration/indecisiveness: no Suicidal ideations:  no Hopelessness: no Crying spells: no    01/27/2024    1:47 PM 11/30/2023   10:09 AM 10/25/2023    9:27 AM 10/19/2023    8:28 AM 08/24/2023    8:13 AM  Depression screen PHQ 2/9  Decreased Interest 1 1 2 3 2   Down, Depressed, Hopeless 0 1 1 0   PHQ - 2 Score 1 2 3 3 2   Altered sleeping 1 0 0 2 2  Tired, decreased energy 2 2 3 3 3   Change in appetite 2 1 3  3   Feeling bad or failure about yourself  0 0 0 0 1  Trouble concentrating 2 1 2 2 2   Moving slowly or fidgety/restless 0 0 0 0 0  Suicidal thoughts 0 0 0 0 0  PHQ-9 Score 8 6 11 10 13   Difficult doing work/chores Somewhat difficult Somewhat difficult Somewhat difficult Somewhat difficult Somewhat difficult     Relevant past medical, surgical, family and social history reviewed and updated as indicated. Interim medical history since our last visit reviewed. Allergies and medications reviewed and updated.  Review of Systems  Constitutional: Negative.   Respiratory:  Positive for shortness of breath. Negative for apnea, cough, choking, chest tightness, wheezing and stridor.   Cardiovascular:  Positive for chest pain. Negative for palpitations and leg swelling.  Musculoskeletal: Negative.   Skin: Negative.   Neurological: Negative.   Psychiatric/Behavioral: Negative.      Per HPI unless specifically indicated above     Objective:    BP 129/79 (BP Location: Left Arm, Patient Position: Sitting, Cuff Size: Large)   Pulse 60   Temp 97.9 F (36.6 C)  Wt 254 lb (115.2 kg)   SpO2 97%   BMI 42.27 kg/m   Wt Readings from Last 3 Encounters:  02/28/24 254 lb (115.2 kg)  02/01/24 250 lb (113.4 kg)  01/27/24 250 lb (113.4 kg)    Physical Exam Vitals and nursing note reviewed.  Constitutional:      General: She is not in acute distress.    Appearance: Normal appearance. She is obese. She is not ill-appearing, toxic-appearing or diaphoretic.  HENT:     Head: Normocephalic and atraumatic.     Right Ear: External ear normal.      Left Ear: External ear normal.     Nose: Nose normal.     Mouth/Throat:     Mouth: Mucous membranes are moist.     Pharynx: Oropharynx is clear.  Eyes:     General: No scleral icterus.       Right eye: No discharge.        Left eye: No discharge.     Extraocular Movements: Extraocular movements intact.     Conjunctiva/sclera: Conjunctivae normal.     Pupils: Pupils are equal, round, and reactive to light.  Cardiovascular:     Rate and Rhythm: Normal rate and regular rhythm.     Pulses: Normal pulses.     Heart sounds: Normal heart sounds. No murmur heard.    No friction rub. No gallop.  Pulmonary:     Effort: Pulmonary effort is normal. No respiratory distress.     Breath sounds: Normal breath sounds. No stridor. No wheezing, rhonchi or rales.  Chest:     Chest wall: No tenderness.  Musculoskeletal:        General: Normal range of motion.     Cervical back: Normal range of motion and neck supple.     Right lower leg: Edema (1+) present.     Left lower leg: Edema (1+) present.  Skin:    General: Skin is warm and dry.     Capillary Refill: Capillary refill takes less than 2 seconds.     Coloration: Skin is not jaundiced or pale.     Findings: No bruising, erythema, lesion or rash.  Neurological:     General: No focal deficit present.     Mental Status: She is alert and oriented to person, place, and time. Mental status is at baseline.  Psychiatric:        Mood and Affect: Mood normal.        Behavior: Behavior normal.        Thought Content: Thought content normal.        Judgment: Judgment normal.     Results for orders placed or performed in visit on 12/27/23  CBC with Differential/Platelet   Collection Time: 12/27/23  5:05 PM  Result Value Ref Range   WBC 8.0 3.4 - 10.8 x10E3/uL   RBC 4.39 3.77 - 5.28 x10E6/uL   Hemoglobin 13.1 11.1 - 15.9 g/dL   Hematocrit 60.7 65.9 - 46.6 %   MCV 89 79 - 97 fL   MCH 29.8 26.6 - 33.0 pg   MCHC 33.4 31.5 - 35.7 g/dL   RDW  87.5 88.2 - 84.5 %   Platelets 271 150 - 450 x10E3/uL   Neutrophils 54 Not Estab. %   Lymphs 32 Not Estab. %   Monocytes 7 Not Estab. %   Eos 5 Not Estab. %   Basos 1 Not Estab. %   Neutrophils Absolute 4.4 1.4 - 7.0 x10E3/uL  Lymphocytes Absolute 2.5 0.7 - 3.1 x10E3/uL   Monocytes Absolute 0.6 0.1 - 0.9 x10E3/uL   EOS (ABSOLUTE) 0.4 0.0 - 0.4 x10E3/uL   Basophils Absolute 0.1 0.0 - 0.2 x10E3/uL   Immature Granulocytes 0 Not Estab. %   Immature Grans (Abs) 0.0 0.0 - 0.1 x10E3/uL  Comprehensive metabolic panel with GFR   Collection Time: 12/27/23  5:05 PM  Result Value Ref Range   Glucose 88 70 - 99 mg/dL   BUN 14 8 - 27 mg/dL   Creatinine, Ser 9.02 0.57 - 1.00 mg/dL   eGFR 66 >40 fO/fpw/8.26   BUN/Creatinine Ratio 14 12 - 28   Sodium 139 134 - 144 mmol/L   Potassium 4.2 3.5 - 5.2 mmol/L   Chloride 105 96 - 106 mmol/L   CO2 21 20 - 29 mmol/L   Calcium  10.2 8.7 - 10.3 mg/dL   Total Protein 6.6 6.0 - 8.5 g/dL   Albumin 4.8 3.9 - 4.9 g/dL   Globulin, Total 1.8 1.5 - 4.5 g/dL   Bilirubin Total 0.4 0.0 - 1.2 mg/dL   Alkaline Phosphatase 70 44 - 121 IU/L   AST 20 0 - 40 IU/L   ALT 13 0 - 32 IU/L  TSH   Collection Time: 12/27/23  5:05 PM  Result Value Ref Range   TSH 1.780 0.450 - 4.500 uIU/mL  Hgb A1c w/o eAG   Collection Time: 12/27/23  5:05 PM  Result Value Ref Range   Hgb A1c MFr Bld 5.6 4.8 - 5.6 %      Assessment & Plan:   Problem List Items Addressed This Visit       Cardiovascular and Mediastinum   CAD (coronary artery disease)   Normal stress test last week. To have ECHO done on Wednesday. Await results Treat as needed.       Relevant Medications   furosemide  (LASIX ) 20 MG tablet     Respiratory   OSA (obstructive sleep apnea) - Primary   Severe OSA on last sleep study. Not using her CPAP- needs new sleep study for titration. Will also change her from wegovy  to zepbound  to help with OSA. Call with any concerns. Recheck about 6 weeks.       Relevant  Orders   Ambulatory referral to Sleep Studies     Other   Severe episode of recurrent major depressive disorder, without psychotic features (HCC)   Not doing great right now, but improved from prior. Will continue to monitor closely. Call with any concerns.       Other Visit Diagnoses       Hypernatremia       Rechecking labs today. Await results. Treat as needed.   Relevant Orders   Basic metabolic panel with GFR     Chronic fatigue       Needs CPAP. Rechecking labs today. Await results. Treat as needed.   Relevant Orders   CBC with Differential/Platelet     Needs flu shot       Flu shot given today.   Relevant Orders   Flu vaccine trivalent PF, 6mos and older(Flulaval,Afluria,Fluarix,Fluzone) (Completed)        Follow up plan: Return in about 6 weeks (around 04/10/2024).

## 2024-02-28 NOTE — Assessment & Plan Note (Signed)
 Not doing great right now, but improved from prior. Will continue to monitor closely. Call with any concerns.

## 2024-02-29 LAB — BASIC METABOLIC PANEL WITH GFR
BUN/Creatinine Ratio: 19 (ref 12–28)
BUN: 16 mg/dL (ref 8–27)
CO2: 20 mmol/L (ref 20–29)
Calcium: 9.3 mg/dL (ref 8.7–10.3)
Chloride: 106 mmol/L (ref 96–106)
Creatinine, Ser: 0.85 mg/dL (ref 0.57–1.00)
Glucose: 69 mg/dL — ABNORMAL LOW (ref 70–99)
Potassium: 4.3 mmol/L (ref 3.5–5.2)
Sodium: 139 mmol/L (ref 134–144)
eGFR: 78 mL/min/1.73 (ref >59)

## 2024-02-29 LAB — CBC WITH DIFFERENTIAL/PLATELET
Basophils Absolute: 0 x10E3/uL (ref 0.0–0.2)
Basos: 1 %
EOS (ABSOLUTE): 0.2 x10E3/uL (ref 0.0–0.4)
Eos: 3 %
Hematocrit: 36.6 % (ref 34.0–46.6)
Hemoglobin: 12 g/dL (ref 11.1–15.9)
Immature Grans (Abs): 0 x10E3/uL (ref 0.0–0.1)
Immature Granulocytes: 0 %
Lymphocytes Absolute: 2.1 x10E3/uL (ref 0.7–3.1)
Lymphs: 31 %
MCH: 29.3 pg (ref 26.6–33.0)
MCHC: 32.8 g/dL (ref 31.5–35.7)
MCV: 90 fL (ref 79–97)
Monocytes Absolute: 0.5 x10E3/uL (ref 0.1–0.9)
Monocytes: 8 %
Neutrophils Absolute: 4 x10E3/uL (ref 1.4–7.0)
Neutrophils: 56 %
Platelets: 238 x10E3/uL (ref 150–450)
RBC: 4.09 x10E6/uL (ref 3.77–5.28)
RDW: 13.7 % (ref 11.7–15.4)
WBC: 6.9 x10E3/uL (ref 3.4–10.8)

## 2024-03-01 ENCOUNTER — Ambulatory Visit (INDEPENDENT_AMBULATORY_CARE_PROVIDER_SITE_OTHER)

## 2024-03-01 ENCOUNTER — Other Ambulatory Visit: Payer: Self-pay

## 2024-03-01 DIAGNOSIS — Z87891 Personal history of nicotine dependence: Secondary | ICD-10-CM

## 2024-03-01 DIAGNOSIS — R0602 Shortness of breath: Secondary | ICD-10-CM

## 2024-03-01 DIAGNOSIS — R609 Edema, unspecified: Secondary | ICD-10-CM

## 2024-03-01 DIAGNOSIS — R0789 Other chest pain: Secondary | ICD-10-CM

## 2024-03-01 DIAGNOSIS — I371 Nonrheumatic pulmonary valve insufficiency: Secondary | ICD-10-CM

## 2024-03-01 DIAGNOSIS — I25118 Atherosclerotic heart disease of native coronary artery with other forms of angina pectoris: Secondary | ICD-10-CM

## 2024-03-01 DIAGNOSIS — Z122 Encounter for screening for malignant neoplasm of respiratory organs: Secondary | ICD-10-CM

## 2024-03-01 DIAGNOSIS — F17210 Nicotine dependence, cigarettes, uncomplicated: Secondary | ICD-10-CM

## 2024-03-01 DIAGNOSIS — I5033 Acute on chronic diastolic (congestive) heart failure: Secondary | ICD-10-CM

## 2024-03-02 ENCOUNTER — Ambulatory Visit: Payer: Self-pay | Admitting: Family Medicine

## 2024-03-06 ENCOUNTER — Ambulatory Visit: Admitting: Cardiovascular Disease

## 2024-03-06 ENCOUNTER — Encounter: Payer: Self-pay | Admitting: Cardiovascular Disease

## 2024-03-06 ENCOUNTER — Ambulatory Visit: Payer: Self-pay | Admitting: Cardiovascular Disease

## 2024-03-06 VITALS — BP 128/74 | HR 75 | Ht 65.0 in | Wt 246.8 lb

## 2024-03-06 DIAGNOSIS — I25118 Atherosclerotic heart disease of native coronary artery with other forms of angina pectoris: Secondary | ICD-10-CM | POA: Diagnosis not present

## 2024-03-06 DIAGNOSIS — R609 Edema, unspecified: Secondary | ICD-10-CM | POA: Insufficient documentation

## 2024-03-06 DIAGNOSIS — R0789 Other chest pain: Secondary | ICD-10-CM | POA: Diagnosis not present

## 2024-03-06 DIAGNOSIS — F17210 Nicotine dependence, cigarettes, uncomplicated: Secondary | ICD-10-CM

## 2024-03-06 DIAGNOSIS — I5033 Acute on chronic diastolic (congestive) heart failure: Secondary | ICD-10-CM

## 2024-03-06 DIAGNOSIS — R9431 Abnormal electrocardiogram [ECG] [EKG]: Secondary | ICD-10-CM

## 2024-03-06 DIAGNOSIS — Z0130 Encounter for examination of blood pressure without abnormal findings: Secondary | ICD-10-CM

## 2024-03-06 DIAGNOSIS — E782 Mixed hyperlipidemia: Secondary | ICD-10-CM

## 2024-03-06 DIAGNOSIS — R0602 Shortness of breath: Secondary | ICD-10-CM | POA: Diagnosis not present

## 2024-03-06 DIAGNOSIS — I25112 Atherosclerotic heart disease of native coronary artery with refractory angina pectoris: Secondary | ICD-10-CM

## 2024-03-06 DIAGNOSIS — I200 Unstable angina: Secondary | ICD-10-CM | POA: Insufficient documentation

## 2024-03-06 NOTE — Progress Notes (Addendum)
 Cardiology Office Note   Date:  03/06/2024   ID:  Hannah Kaiser, DOB Nov 21, 1960, MRN [id number]  PCP:  Vicci Duwaine SQUIBB, DO  Cardiologist:  Denyse Bathe, MD      History of Present Illness: Hannah Kaiser is a 63 y.o. female who presents for  Chief Complaint  Patient presents with  . Follow-up    3 week nst/echo results    Still hurt, in chest.      Past Medical History:  Diagnosis Date  . Anemia   . Anxiety   . Arthritis    knees, thoracic spurs and arthritis  . Asthma   . Breast pain   . CHF (congestive heart failure) (HCC)   . Colon polyps   . COPD (chronic obstructive pulmonary disease) (HCC)   . Depression   . Dysrhythmia   . GERD (gastroesophageal reflux disease)   . Glaucoma   . Heart murmur   . History of blood transfusion   . Hypertension   . Motion sickness    cars  . Multilevel degenerative disc disease   . Neuropathy    Bilateral arms and legs  . Nipple discharge   . Numbness of both lower extremities    bulging lumbar discs - per pt  . Numbness of upper extremity    bilateral - degenerative cervical discs - per pt  . Pneumonia   . PONV (postoperative nausea and vomiting)   . Sleep apnea    has CPAP  . Smokers' cough (HCC)   . Swelling of limb 03/11/2021  . Wears dentures    full upper and lower     Past Surgical History:  Procedure Laterality Date  . ABDOMINAL HYSTERECTOMY    . BACK SURGERY    . BELPHAROPTOSIS REPAIR    . BROW LIFT Bilateral 03/24/2016   Procedure: BLEPHAROPLASTY;  Surgeon: Greig CHRISTELLA Gay, MD;  Location: [facility];  Service: Ophthalmology;  Laterality: Bilateral;  sleep apnea  . CARDIAC CATHETERIZATION    . COLONOSCOPY WITH PROPOFOL  N/A 10/19/2016   Procedure: COLONOSCOPY WITH PROPOFOL ;  Surgeon: Jinny Carmine, MD;  Location: [facility];  Service: Endoscopy;  Laterality: N/A;  . COLONOSCOPY WITH PROPOFOL  N/A 05/12/2022   Procedure: COLONOSCOPY WITH PROPOFOL ;  Surgeon: Jinny Carmine, MD;  Location:  [facility];  Service: Endoscopy;  Laterality: N/A;  . DILATION AND CURETTAGE OF UTERUS    . ECTOPIC PREGNANCY SURGERY     x2  . ESOPHAGOGASTRODUODENOSCOPY (EGD) WITH PROPOFOL  N/A 10/19/2016   Procedure: ESOPHAGOGASTRODUODENOSCOPY (EGD) WITH PROPOFOL ;  Surgeon: Jinny Carmine, MD;  Location: [facility];  Service: Endoscopy;  Laterality: N/A;  . LEFT HEART CATH AND CORONARY ANGIOGRAPHY Left 12/29/2016   Procedure: Left Heart Cath and Coronary Angiography;  Surgeon: Bathe Denyse LABOR, MD;  Location: [facility];  Service: Cardiovascular;  Laterality: Left;  . NECK SURGERY  2003  . POLYPECTOMY  10/19/2016   Procedure: POLYPECTOMY;  Surgeon: Jinny Carmine, MD;  Location: [facility];  Service: Endoscopy;;  . SPINE SURGERY  1989, 1998  . TOTAL ABDOMINAL HYSTERECTOMY W/ BILATERAL SALPINGOOPHORECTOMY  2014  . TUBAL LIGATION       Current Outpatient Medications  Medication Sig Dispense Refill  . albuterol  (VENTOLIN  HFA) 108 (90 Base) MCG/ACT inhaler Inhale 1-2 puffs into the lungs every 6 (six) hours as needed for wheezing or shortness of breath. 18 each 1  . amLODipine  (NORVASC ) 10 MG tablet Take 1 tablet (10 mg total) by mouth  daily. 90 tablet 1  . aspirin  EC 81 MG tablet Take 1 tablet (81 mg total) by mouth daily. Swallow whole. 30 tablet 12  . atenolol  (TENORMIN ) 50 MG tablet Take 1 tablet (50 mg total) by mouth daily. 90 tablet 1  . budesonide -glycopyrrolate -formoterol  (BREZTRI  AEROSPHERE) 160-9-4.8 MCG/ACT AERO inhaler Inhale 2 puffs into the lungs 2 (two) times daily.    . busPIRone  (BUSPAR ) 15 MG tablet Take 1 tablet (15 mg total) by mouth 3 (three) times daily. 270 tablet 1  . clonazePAM  (KLONOPIN ) 1 MG tablet Take 1 tablet (1 mg total) by mouth 2 (two) times daily as needed. for anxiety 60 tablet 0  . Evolocumab  (REPATHA  SURECLICK) 140 MG/ML SOAJ Inject 140 mg into the skin every 14 (fourteen) days. 2 mL 2  . ezetimibe  (ZETIA ) 10 MG tablet Take 1 tablet (10 mg  total) by mouth daily. 90 tablet 1  . FLUoxetine  (PROZAC ) 40 MG capsule Take 2 capsules (80 mg total) by mouth daily. 180 capsule 1  . furosemide  (LASIX ) 20 MG tablet TAKE 1 TABLET BY MOUTH ONCE DAILY AS NEEDED FOR SWELLING (Patient taking differently: Take 20 mg by mouth as needed.) 60 tablet 3  . hydrOXYzine  (ATARAX ) 25 MG tablet Take 1 tablet (25 mg total) by mouth 3 (three) times daily as needed. 270 tablet 1  . ibuprofen  (ADVIL ) 800 MG tablet Take 1 tablet (800 mg total) by mouth every 8 (eight) hours as needed. 90 tablet 0  . isosorbide  mononitrate (IMDUR ) 60 MG 24 hr tablet Take 1 tablet (60 mg total) by mouth daily. 90 tablet 1  . losartan  (COZAAR ) 100 MG tablet Take 1 tablet (100 mg total) by mouth daily. 90 tablet 1  . mupirocin  ointment (BACTROBAN ) 2 % Apply 1 application. topically 2 (two) times daily. (Patient taking differently: Apply 1 application  topically as needed.) 22 g 0  . nystatin -triamcinolone  ointment (MYCOLOG) Apply 1 application topically 2 (two) times daily. (Patient taking differently: Apply 1 application  topically as needed.) 30 g 1  . ondansetron  (ZOFRAN ) 4 MG tablet Take 1 tablet (4 mg total) by mouth every 8 (eight) hours as needed for nausea or vomiting. 60 tablet 3  . pantoprazole  (PROTONIX ) 40 MG tablet Take 1 tablet (40 mg total) by mouth 2 (two) times daily. 180 tablet 1  . QUEtiapine  Fumarate (SEROQUEL  XR) 150 MG 24 hr tablet Take 1 tablet (150 mg total) by mouth at bedtime. (Patient taking differently: Take 50 mg by mouth at bedtime.) 90 tablet 0  . ranolazine  (RANEXA ) 500 MG 12 hr tablet TAKE 1 TABLET BY MOUTH TWICE A DAY 180 tablet 0  . semaglutide -weight management (WEGOVY ) 0.25 MG/0.5ML SOAJ SQ injection Inject 0.25 mg into the skin once a week. 2 mL 0  . tirzepatide  (ZEPBOUND ) 5 MG/0.5ML Pen Inject 5 mg into the skin once a week. 2 mL 0  . tiZANidine  (ZANAFLEX ) 4 MG tablet Take 1 tablet (4 mg total) by mouth every 8 (eight) hours as needed for muscle  spasms. 90 tablet 1  . triamcinolone  ointment (KENALOG ) 0.5 % Apply 1 application topically 2 (two) times daily. (Patient taking differently: Apply 1 application  topically as needed.) 30 g 0  . valACYclovir  (VALTREX ) 1000 MG tablet Take 1 tablet (1,000 mg total) by mouth daily. (Patient taking differently: Take 1,000 mg by mouth as needed.) 180 tablet 1  . Vitamin D , Ergocalciferol , (DRISDOL ) 1.25 MG (50000 UNIT) CAPS capsule TAKE 1 CAPSULE BY MOUTH ONE TIME PER WEEK 12 capsule  0   No current facility-administered medications for this visit.    Allergies:   Aripiprazole , Bupropion, Ace inhibitors, Paxlovid [nirmatrelvir-ritonavir], Statins, Azithromycin, and Erythromycin    Social History:   reports that she has been smoking cigarettes. She has a 61.5 pack-year smoking history. She has never used smokeless tobacco. She reports that she does not drink alcohol and does not use drugs.   Family History:  family history includes Cancer in her brother, maternal grandfather, and maternal uncle.    ROS:     Review of Systems  Constitutional: Negative.   HENT: Negative.    Eyes: Negative.   Respiratory: Negative.    Gastrointestinal: Negative.   Genitourinary: Negative.   Musculoskeletal: Negative.   Skin: Negative.   Neurological: Negative.   Endo/Heme/Allergies: Negative.   Psychiatric/Behavioral: Negative.    All other systems reviewed and are negative.     All other systems are reviewed and negative.    PHYSICAL EXAM: VS:  BP 128/74   Pulse 75   Ht 5' 5 (1.651 m)   Wt 246 lb 12.8 oz (111.9 kg)   SpO2 97%   BMI 41.07 kg/m  , BMI Body mass index is 41.07 kg/m. Last weight:  Wt Readings from Last 3 Encounters:  03/06/24 246 lb 12.8 oz (111.9 kg)  02/28/24 254 lb (115.2 kg)  02/01/24 250 lb (113.4 kg)     Physical Exam Constitutional:      Appearance: Normal appearance.  Cardiovascular:     Rate and Rhythm: Normal rate and regular rhythm.     Heart sounds: Normal  heart sounds.  Pulmonary:     Effort: Pulmonary effort is normal.     Breath sounds: Normal breath sounds.  Musculoskeletal:     Right lower leg: No edema.     Left lower leg: No edema.  Neurological:     Mental Status: She is alert.       EKG:   Recent Labs: 12/27/2023: ALT 13; TSH 1.780 02/28/2024: BUN 16; Creatinine, Ser 0.85; Hemoglobin 12.0; Platelets 238; Potassium 4.3; Sodium 139    Lipid Panel    Component Value Date/Time   CHOL 227 (H) 10/19/2023 0926   TRIG 135 10/19/2023 0926   HDL 53 10/19/2023 0926   CHOLHDL 4.7 (H) 08/30/2020 0904   LDLCALC 150 (H) 10/19/2023 9073      Other studies Reviewed: Additional studies/ records that were reviewed today include:  Review of the above records demonstrates:       No data to display            ASSESSMENT AND PLAN:    ICD-10-CM   1. Unstable angina (HCC)  I20.0 Procedural/ Surgical Case Request: LEFT HEART CATH AND CORONARY ANGIOGRAPHY with possible coronary intervention    Basic metabolic panel with GFR    CBC with Differential/Platelet    Procedural/ Surgical Case Request: LEFT HEART CATH AND CORONARY ANGIOGRAPHY with possible coronary intervention   stress test was normal, echo was normal. Had 2 vessel 40% LCX and RCA. On ranexa  and imdur , and still no relief. Advise cath.    2. Nicotine dependence, cigarettes, uncomplicated  F17.210 Procedural/ Surgical Case Request: LEFT HEART CATH AND CORONARY ANGIOGRAPHY with possible coronary intervention    Basic metabolic panel with GFR    CBC with Differential/Platelet    Procedural/ Surgical Case Request: LEFT HEART CATH AND CORONARY ANGIOGRAPHY with possible coronary intervention    3. Coronary artery disease of native artery of native heart with stable  angina pectoris  I25.118 Procedural/ Surgical Case Request: LEFT HEART CATH AND CORONARY ANGIOGRAPHY with possible coronary intervention    Basic metabolic panel with GFR    CBC with Differential/Platelet     Procedural/ Surgical Case Request: LEFT HEART CATH AND CORONARY ANGIOGRAPHY with possible coronary intervention    4. Other chest pain  R07.89 Procedural/ Surgical Case Request: LEFT HEART CATH AND CORONARY ANGIOGRAPHY with possible coronary intervention    Basic metabolic panel with GFR    CBC with Differential/Platelet    Procedural/ Surgical Case Request: LEFT HEART CATH AND CORONARY ANGIOGRAPHY with possible coronary intervention    5. SOB (shortness of breath)  R06.02 Procedural/ Surgical Case Request: LEFT HEART CATH AND CORONARY ANGIOGRAPHY with possible coronary intervention    Basic metabolic panel with GFR    CBC with Differential/Platelet    Procedural/ Surgical Case Request: LEFT HEART CATH AND CORONARY ANGIOGRAPHY with possible coronary intervention    6. CHF (congestive heart failure), NYHA class III, acute on chronic, diastolic (HCC)  I50.33 Procedural/ Surgical Case Request: LEFT HEART CATH AND CORONARY ANGIOGRAPHY with possible coronary intervention    Basic metabolic panel with GFR    CBC with Differential/Platelet    Procedural/ Surgical Case Request: LEFT HEART CATH AND CORONARY ANGIOGRAPHY with possible coronary intervention    7. Edema, unspecified type  R60.9 Procedural/ Surgical Case Request: LEFT HEART CATH AND CORONARY ANGIOGRAPHY with possible coronary intervention    Basic metabolic panel with GFR    CBC with Differential/Platelet    Procedural/ Surgical Case Request: LEFT HEART CATH AND CORONARY ANGIOGRAPHY with possible coronary intervention    8. Unstable angina (HCC)  I20.0 Procedural/ Surgical Case Request: LEFT HEART CATH AND CORONARY ANGIOGRAPHY with possible coronary intervention    Basic metabolic panel with GFR    CBC with Differential/Platelet    Procedural/ Surgical Case Request: LEFT HEART CATH AND CORONARY ANGIOGRAPHY with possible coronary intervention       Problem List Items Addressed This Visit       Cardiovascular and Mediastinum   CAD  (coronary artery disease)   Relevant Orders   Procedural/ Surgical Case Request: LEFT HEART CATH AND CORONARY ANGIOGRAPHY with possible coronary intervention (Completed)   Basic metabolic panel with GFR   CBC with Differential/Platelet   CHF (congestive heart failure), NYHA class III, acute on chronic, diastolic (HCC)   Relevant Orders   Procedural/ Surgical Case Request: LEFT HEART CATH AND CORONARY ANGIOGRAPHY with possible coronary intervention (Completed)   Basic metabolic panel with GFR   CBC with Differential/Platelet   Unstable angina (HCC) - Primary   Relevant Orders   Procedural/ Surgical Case Request: LEFT HEART CATH AND CORONARY ANGIOGRAPHY with possible coronary intervention (Completed)   Basic metabolic panel with GFR   CBC with Differential/Platelet     Other   Nicotine dependence, cigarettes, uncomplicated   Relevant Orders   Procedural/ Surgical Case Request: LEFT HEART CATH AND CORONARY ANGIOGRAPHY with possible coronary intervention (Completed)   Basic metabolic panel with GFR   CBC with Differential/Platelet   Other chest pain   Relevant Orders   Procedural/ Surgical Case Request: LEFT HEART CATH AND CORONARY ANGIOGRAPHY with possible coronary intervention (Completed)   Basic metabolic panel with GFR   CBC with Differential/Platelet   SOB (shortness of breath)   Relevant Orders   Procedural/ Surgical Case Request: LEFT HEART CATH AND CORONARY ANGIOGRAPHY with possible coronary intervention (Completed)   Basic metabolic panel with GFR   CBC with Differential/Platelet  Edema   Relevant Orders   Procedural/ Surgical Case Request: LEFT HEART CATH AND CORONARY ANGIOGRAPHY with possible coronary intervention (Completed)   Basic metabolic panel with GFR   CBC with Differential/Platelet       Disposition:   Return in about 2 weeks (around 03/20/2024) for Please call Heron to let her know that she is scheduled at 1230 tomorrow.    Total time spent: 50  minutes  Signed,  Denyse Bathe, MD  03/06/2024 10:11 AM    [facility]

## 2024-03-07 ENCOUNTER — Encounter
Admission: RE | Disposition: A | Discharge status: Home / Self Care | Payer: Self-pay | Source: Home / Self Care | Attending: Internal Medicine

## 2024-03-07 ENCOUNTER — Observation Stay
Admission: RE | Admit: 2024-03-07 | Discharge: 2024-03-08 | Disposition: A | Discharge status: Home / Self Care | Attending: Internal Medicine | Admitting: Internal Medicine

## 2024-03-07 ENCOUNTER — Other Ambulatory Visit: Payer: Self-pay

## 2024-03-07 ENCOUNTER — Encounter: Payer: Self-pay | Admitting: Internal Medicine

## 2024-03-07 DIAGNOSIS — R9431 Abnormal electrocardiogram [ECG] [EKG]: Secondary | ICD-10-CM

## 2024-03-07 DIAGNOSIS — J449 Chronic obstructive pulmonary disease, unspecified: Secondary | ICD-10-CM | POA: Diagnosis not present

## 2024-03-07 DIAGNOSIS — I25110 Atherosclerotic heart disease of native coronary artery with unstable angina pectoris: Secondary | ICD-10-CM | POA: Diagnosis not present

## 2024-03-07 DIAGNOSIS — Z7982 Long term (current) use of aspirin: Secondary | ICD-10-CM | POA: Diagnosis not present

## 2024-03-07 DIAGNOSIS — J45909 Unspecified asthma, uncomplicated: Secondary | ICD-10-CM | POA: Insufficient documentation

## 2024-03-07 DIAGNOSIS — I200 Unstable angina: Secondary | ICD-10-CM | POA: Diagnosis not present

## 2024-03-07 DIAGNOSIS — R609 Edema, unspecified: Secondary | ICD-10-CM | POA: Diagnosis not present

## 2024-03-07 DIAGNOSIS — I110 Hypertensive heart disease with heart failure: Secondary | ICD-10-CM | POA: Insufficient documentation

## 2024-03-07 DIAGNOSIS — E782 Mixed hyperlipidemia: Secondary | ICD-10-CM

## 2024-03-07 DIAGNOSIS — R0789 Other chest pain: Secondary | ICD-10-CM | POA: Diagnosis not present

## 2024-03-07 DIAGNOSIS — I5033 Acute on chronic diastolic (congestive) heart failure: Secondary | ICD-10-CM

## 2024-03-07 DIAGNOSIS — I2510 Atherosclerotic heart disease of native coronary artery without angina pectoris: Secondary | ICD-10-CM | POA: Insufficient documentation

## 2024-03-07 DIAGNOSIS — Z79899 Other long term (current) drug therapy: Secondary | ICD-10-CM | POA: Insufficient documentation

## 2024-03-07 DIAGNOSIS — R0602 Shortness of breath: Secondary | ICD-10-CM | POA: Insufficient documentation

## 2024-03-07 DIAGNOSIS — I25112 Atherosclerotic heart disease of native coronary artery with refractory angina pectoris: Secondary | ICD-10-CM

## 2024-03-07 DIAGNOSIS — R079 Chest pain, unspecified: Secondary | ICD-10-CM | POA: Diagnosis not present

## 2024-03-07 DIAGNOSIS — F17210 Nicotine dependence, cigarettes, uncomplicated: Secondary | ICD-10-CM

## 2024-03-07 DIAGNOSIS — I25118 Atherosclerotic heart disease of native coronary artery with other forms of angina pectoris: Secondary | ICD-10-CM

## 2024-03-07 HISTORY — PX: LEFT HEART CATH AND CORONARY ANGIOGRAPHY: CATH118249

## 2024-03-07 HISTORY — PX: CORONARY STENT INTERVENTION: CATH118234

## 2024-03-07 LAB — BASIC METABOLIC PANEL WITH GFR
BUN/Creatinine Ratio: 16 (ref 12–28)
BUN: 15 mg/dL (ref 8–27)
CO2: 21 mmol/L (ref 20–29)
Calcium: 9.4 mg/dL (ref 8.7–10.3)
Chloride: 105 mmol/L (ref 96–106)
Creatinine, Ser: 0.91 mg/dL (ref 0.57–1.00)
Glucose: 73 mg/dL (ref 70–99)
Potassium: 4.6 mmol/L (ref 3.5–5.2)
Sodium: 140 mmol/L (ref 134–144)
eGFR: 72 mL/min/1.73 (ref >59)

## 2024-03-07 LAB — CBC WITH DIFFERENTIAL/PLATELET
Basophils Absolute: 0 x10E3/uL (ref 0.0–0.2)
Basos: 1 %
EOS (ABSOLUTE): 0.2 x10E3/uL (ref 0.0–0.4)
Eos: 3 %
Hematocrit: 39.1 % (ref 34.0–46.6)
Hemoglobin: 12.9 g/dL (ref 11.1–15.9)
Immature Grans (Abs): 0 x10E3/uL (ref 0.0–0.1)
Immature Granulocytes: 0 %
Lymphocytes Absolute: 1.8 x10E3/uL (ref 0.7–3.1)
Lymphs: 24 %
MCH: 29.1 pg (ref 26.6–33.0)
MCHC: 33 g/dL (ref 31.5–35.7)
MCV: 88 fL (ref 79–97)
Monocytes Absolute: 0.5 x10E3/uL (ref 0.1–0.9)
Monocytes: 6 %
Neutrophils Absolute: 4.9 x10E3/uL (ref 1.4–7.0)
Neutrophils: 65 %
Platelets: 260 x10E3/uL (ref 150–450)
RBC: 4.43 x10E6/uL (ref 3.77–5.28)
RDW: 13.7 % (ref 11.7–15.4)
WBC: 7.4 x10E3/uL (ref 3.4–10.8)

## 2024-03-07 LAB — POCT ACTIVATED CLOTTING TIME: Activated Clotting Time: 360 s

## 2024-03-07 SURGERY — LEFT HEART CATH AND CORONARY ANGIOGRAPHY
Anesthesia: Moderate Sedation

## 2024-03-07 MED ORDER — SODIUM CHLORIDE 0.9 % IV SOLN: 250.0000 mL | INTRAVENOUS | Status: DC | PRN

## 2024-03-07 MED ORDER — PRASUGREL HCL 10 MG PO TABS
ORAL_TABLET | ORAL | Status: AC
  Filled 2024-03-07: qty 6

## 2024-03-07 MED ORDER — SODIUM CHLORIDE 0.9% FLUSH: 3.0000 mL | INTRAVENOUS | Status: DC | PRN

## 2024-03-07 MED ORDER — PRASUGREL HCL 10 MG PO TABS
ORAL_TABLET | ORAL | Status: DC | PRN
  Administered 2024-03-07: 60 mg via ORAL

## 2024-03-07 MED ORDER — ASPIRIN 81 MG PO CHEW
CHEWABLE_TABLET | ORAL | Status: DC | PRN
  Administered 2024-03-07: 243 mg via ORAL

## 2024-03-07 MED ORDER — ASPIRIN 81 MG PO CHEW
81.0000 mg | CHEWABLE_TABLET | Freq: Every day | ORAL | Status: DC
Start: 2024-03-08 — End: 2024-03-08
  Administered 2024-03-08: 81 mg via ORAL
  Filled 2024-03-07: qty 1

## 2024-03-07 MED ORDER — ASPIRIN 81 MG PO CHEW
CHEWABLE_TABLET | ORAL | Status: AC
  Filled 2024-03-07: qty 3

## 2024-03-07 MED ORDER — HYDRALAZINE HCL 20 MG/ML IJ SOLN: 10.0000 mg | INTRAMUSCULAR | Status: AC | PRN

## 2024-03-07 MED ORDER — BIVALIRUDIN TRIFLUOROACETATE 250 MG IV SOLR
INTRAVENOUS | Status: AC
  Filled 2024-03-07: qty 250

## 2024-03-07 MED ORDER — BIVALIRUDIN BOLUS VIA INFUSION - CUPID
INTRAVENOUS | Status: DC | PRN
  Administered 2024-03-07: 83.025 mg via INTRAVENOUS

## 2024-03-07 MED ORDER — CLONAZEPAM 0.5 MG PO TABS: 1.0000 mg | ORAL_TABLET | Freq: Two times a day (BID) | ORAL | Status: DC | PRN

## 2024-03-07 MED ORDER — FENTANYL CITRATE (PF) 100 MCG/2ML IJ SOLN
INTRAMUSCULAR | Status: DC | PRN
  Administered 2024-03-07: 50 ug via INTRAVENOUS

## 2024-03-07 MED ORDER — SODIUM CHLORIDE 0.9 % IV SOLN
INTRAVENOUS | Status: AC | PRN
  Administered 2024-03-07: 1.75 mg/kg/h via INTRAVENOUS

## 2024-03-07 MED ORDER — ATENOLOL 50 MG PO TABS
50.0000 mg | ORAL_TABLET | Freq: Every day | ORAL | Status: DC
  Administered 2024-03-08: 50 mg via ORAL
  Filled 2024-03-07 (×2): qty 1

## 2024-03-07 MED ORDER — LIDOCAINE HCL (PF) 1 % IJ SOLN
INTRAMUSCULAR | Status: DC | PRN
  Administered 2024-03-07: 30 mL

## 2024-03-07 MED ORDER — BUSPIRONE HCL 10 MG PO TABS
15.0000 mg | ORAL_TABLET | Freq: Three times a day (TID) | ORAL | Status: DC
  Administered 2024-03-07 – 2024-03-08 (×2): 15 mg via ORAL
  Filled 2024-03-07 (×2): qty 2
  Filled 2024-03-07: qty 1

## 2024-03-07 MED ORDER — PANTOPRAZOLE SODIUM 40 MG PO TBEC
40.0000 mg | DELAYED_RELEASE_TABLET | Freq: Two times a day (BID) | ORAL | Status: DC
  Administered 2024-03-07 – 2024-03-08 (×2): 40 mg via ORAL
  Filled 2024-03-07 (×2): qty 1

## 2024-03-07 MED ORDER — ASPIRIN 81 MG PO TBEC
81.0000 mg | DELAYED_RELEASE_TABLET | Freq: Every day | ORAL | Status: DC
Start: 2024-03-08 — End: 2024-03-07

## 2024-03-07 MED ORDER — IOHEXOL 300 MG/ML  SOLN
INTRAMUSCULAR | Status: DC | PRN
  Administered 2024-03-07: 90 mL

## 2024-03-07 MED ORDER — EZETIMIBE 10 MG PO TABS
10.0000 mg | ORAL_TABLET | Freq: Every day | ORAL | Status: DC
  Administered 2024-03-08: 10 mg via ORAL
  Filled 2024-03-07 (×2): qty 1

## 2024-03-07 MED ORDER — PRASUGREL HCL 10 MG PO TABS
10.0000 mg | ORAL_TABLET | Freq: Every day | ORAL | Status: DC
Start: 2024-03-08 — End: 2024-03-08
  Administered 2024-03-08: 10 mg via ORAL
  Filled 2024-03-07: qty 1

## 2024-03-07 MED ORDER — SODIUM CHLORIDE 0.9% FLUSH
3.0000 mL | Freq: Two times a day (BID) | INTRAVENOUS | Status: DC
  Administered 2024-03-07 – 2024-03-08 (×2): 3 mL via INTRAVENOUS

## 2024-03-07 MED ORDER — TIZANIDINE HCL 4 MG PO TABS: 4.0000 mg | ORAL_TABLET | Freq: Three times a day (TID) | ORAL | Status: DC | PRN

## 2024-03-07 MED ORDER — ONDANSETRON HCL 4 MG/2ML IJ SOLN: 4.0000 mg | Freq: Four times a day (QID) | INTRAMUSCULAR | Status: DC | PRN

## 2024-03-07 MED ORDER — IOHEXOL 300 MG/ML  SOLN
INTRAMUSCULAR | Status: DC | PRN
  Administered 2024-03-07: 190 mL

## 2024-03-07 MED ORDER — FLUOXETINE HCL 20 MG PO CAPS
80.0000 mg | ORAL_CAPSULE | Freq: Every day | ORAL | Status: DC
Start: 2024-03-08 — End: 2024-03-08
  Administered 2024-03-08: 80 mg via ORAL
  Filled 2024-03-07 (×2): qty 4

## 2024-03-07 MED ORDER — HYDROXYZINE HCL 25 MG PO TABS: 25.0000 mg | ORAL_TABLET | Freq: Three times a day (TID) | ORAL | Status: DC | PRN

## 2024-03-07 MED ORDER — TIROFIBAN HCL IN NACL 5-0.9 MG/100ML-% IV SOLN
INTRAVENOUS | Status: AC
  Filled 2024-03-07: qty 100

## 2024-03-07 MED ORDER — TRAMADOL HCL 50 MG PO TABS
100.0000 mg | ORAL_TABLET | Freq: Four times a day (QID) | ORAL | Status: DC | PRN
  Administered 2024-03-07 – 2024-03-08 (×2): 100 mg via ORAL
  Filled 2024-03-07 (×2): qty 2

## 2024-03-07 MED ORDER — SODIUM CHLORIDE 0.9 % IV SOLN
INTRAVENOUS | Status: AC | PRN
  Administered 2024-03-07: .25 mg/kg/h via INTRAVENOUS

## 2024-03-07 MED ORDER — AMLODIPINE BESYLATE 10 MG PO TABS
10.0000 mg | ORAL_TABLET | Freq: Every day | ORAL | Status: DC
  Administered 2024-03-08: 10 mg via ORAL
  Filled 2024-03-07: qty 1

## 2024-03-07 MED ORDER — LABETALOL HCL 5 MG/ML IV SOLN: 10.0000 mg | INTRAVENOUS | Status: AC | PRN

## 2024-03-07 MED ORDER — SODIUM CHLORIDE 0.9 % WEIGHT BASED INFUSION
1.0000 mL/kg/h | INTRAVENOUS | Status: DC
  Administered 2024-03-07: 1 mL/kg/h via INTRAVENOUS

## 2024-03-07 MED ORDER — BIVALIRUDIN BOLUS VIA INFUSION - CUPID: INTRAVENOUS | Status: DC | PRN

## 2024-03-07 MED ORDER — ACETAMINOPHEN 325 MG PO TABS: 650.0000 mg | ORAL_TABLET | ORAL | Status: DC | PRN

## 2024-03-07 MED ORDER — QUETIAPINE FUMARATE ER 50 MG PO TB24
150.0000 mg | ORAL_TABLET | Freq: Every day | ORAL | Status: DC
  Administered 2024-03-07: 150 mg via ORAL
  Filled 2024-03-07 (×2): qty 3

## 2024-03-07 MED ORDER — FUROSEMIDE 20 MG PO TABS
20.0000 mg | ORAL_TABLET | Freq: Every day | ORAL | Status: DC
  Administered 2024-03-08: 20 mg via ORAL
  Filled 2024-03-07 (×2): qty 1

## 2024-03-07 MED ORDER — LOSARTAN POTASSIUM 50 MG PO TABS
100.0000 mg | ORAL_TABLET | Freq: Every day | ORAL | Status: DC
  Administered 2024-03-08: 100 mg via ORAL
  Filled 2024-03-07: qty 2

## 2024-03-07 MED ORDER — RANOLAZINE ER 500 MG PO TB12
500.0000 mg | ORAL_TABLET | Freq: Two times a day (BID) | ORAL | Status: DC
  Administered 2024-03-07 – 2024-03-08 (×2): 500 mg via ORAL
  Filled 2024-03-07 (×2): qty 1

## 2024-03-07 MED ORDER — LIDOCAINE HCL 1 % IJ SOLN
INTRAMUSCULAR | Status: AC
  Filled 2024-03-07: qty 20

## 2024-03-07 MED ORDER — SODIUM CHLORIDE 0.9 % IV SOLN: INTRAVENOUS | Status: DC | PRN

## 2024-03-07 MED ORDER — MIDAZOLAM HCL 2 MG/2ML IJ SOLN
INTRAMUSCULAR | Status: DC | PRN
  Administered 2024-03-07: 1 mg via INTRAVENOUS

## 2024-03-07 MED ORDER — ASPIRIN 81 MG PO CHEW: 81.0000 mg | CHEWABLE_TABLET | ORAL | Status: DC

## 2024-03-07 MED ORDER — FREE WATER
500.0000 mL | Freq: Once | Status: AC
  Administered 2024-03-07: 500 mL via ORAL

## 2024-03-07 MED ORDER — MIDAZOLAM HCL 2 MG/2ML IJ SOLN
INTRAMUSCULAR | Status: AC
  Filled 2024-03-07: qty 2

## 2024-03-07 MED ORDER — FENTANYL CITRATE (PF) 100 MCG/2ML IJ SOLN
INTRAMUSCULAR | Status: AC
  Filled 2024-03-07: qty 2

## 2024-03-07 MED ORDER — HEPARIN (PORCINE) IN NACL 1000-0.9 UT/500ML-% IV SOLN
INTRAVENOUS | Status: AC
  Filled 2024-03-07: qty 1000

## 2024-03-07 MED ORDER — ISOSORBIDE MONONITRATE ER 60 MG PO TB24
60.0000 mg | ORAL_TABLET | Freq: Every day | ORAL | Status: DC
  Administered 2024-03-08: 60 mg via ORAL
  Filled 2024-03-07: qty 1

## 2024-03-07 SURGICAL SUPPLY — 22 items
BALLOON TREK RX 2.25X12 (BALLOONS) IMPLANT
BALLOON ~~LOC~~ TREK NEO RX 3.25X8 (BALLOONS) IMPLANT
CATH INFINITI 5FR ANG PIGTAIL (CATHETERS) IMPLANT
CATH INFINITI 5FR JL4 (CATHETERS) IMPLANT
CATH INFINITI JR4 5F (CATHETERS) IMPLANT
CATH VISTA GUIDE 6FR XB3.5 EPK (CATHETERS) IMPLANT
DEVICE CLOSURE MYNXGRIP 6/7F (Vascular Products) IMPLANT
KIT ENCORE 26 ADVANTAGE (KITS) IMPLANT
KIT SYRINGE INJ CVI SPIKEX1 (MISCELLANEOUS) IMPLANT
NDL PERC 18GX7CM (NEEDLE) IMPLANT
NEEDLE PERC 18GX7CM (NEEDLE) ×2 IMPLANT
PACK CARDIAC CATH (CUSTOM PROCEDURE TRAY) ×2 IMPLANT
PANNUS RETENTION SYSTEM 2 PAD (MISCELLANEOUS) IMPLANT
SET ATX-X65L (MISCELLANEOUS) IMPLANT
SHEATH AVANTI 5FR X 11CM (SHEATH) IMPLANT
SHEATH AVANTI 6FR X 11CM (SHEATH) IMPLANT
STATION PROTECTION PRESSURIZED (MISCELLANEOUS) IMPLANT
STENT ONYX FRONTIER 3.0X15 (Permanent Stent) IMPLANT
TUBING CIL FLEX 10 FLL-RA (TUBING) IMPLANT
WIRE ASAHI PROWATER 180CM (WIRE) IMPLANT
WIRE G HI TQ BMW 190 (WIRE) IMPLANT
WIRE GUIDERIGHT .035X150 (WIRE) IMPLANT

## 2024-03-07 NOTE — Progress Notes (Signed)
 The patient is still  c/o chest pain  of 10/10.  On a pain scale. Only Tylenol  PRN is ordered. Notified Dr. Florencio to see if she could get anything else. Awaiting for response. She's eating a sandwich tray right now. Will continue to monitor.

## 2024-03-07 NOTE — Progress Notes (Signed)
 The patient is admitted from Cath lab to 2 A 46. She's A & O x 4. The patient is  oriented to her room, staff and ascom/call bell. Full assessment to epic completed. Will continue to monitor.

## 2024-03-07 NOTE — Progress Notes (Signed)
 New order for Tramadol  PRN received from Dr. Florencio. See the [facility]

## 2024-03-08 ENCOUNTER — Encounter: Payer: Self-pay | Admitting: Cardiovascular Disease

## 2024-03-08 DIAGNOSIS — I25110 Atherosclerotic heart disease of native coronary artery with unstable angina pectoris: Secondary | ICD-10-CM | POA: Diagnosis not present

## 2024-03-08 DIAGNOSIS — I5033 Acute on chronic diastolic (congestive) heart failure: Secondary | ICD-10-CM | POA: Diagnosis not present

## 2024-03-08 DIAGNOSIS — R079 Chest pain, unspecified: Secondary | ICD-10-CM | POA: Diagnosis not present

## 2024-03-08 DIAGNOSIS — I200 Unstable angina: Secondary | ICD-10-CM | POA: Diagnosis not present

## 2024-03-08 DIAGNOSIS — R0602 Shortness of breath: Secondary | ICD-10-CM | POA: Diagnosis not present

## 2024-03-08 LAB — CBC
HCT: 33.9 % — ABNORMAL LOW (ref 36.0–46.0)
Hemoglobin: 11.1 g/dL — ABNORMAL LOW (ref 12.0–15.0)
MCH: 28.3 pg (ref 26.0–34.0)
MCHC: 32.7 g/dL (ref 30.0–36.0)
MCV: 86.5 fL (ref 80.0–100.0)
Platelets: 203 K/uL (ref 150–400)
RBC: 3.92 MIL/uL (ref 3.87–5.11)
RDW: 13.6 % (ref 11.5–15.5)
WBC: 5.8 K/uL (ref 4.0–10.5)
nRBC: 0 % (ref 0.0–0.2)

## 2024-03-08 LAB — BASIC METABOLIC PANEL WITH GFR
Anion gap: 6 (ref 5–15)
BUN: 13 mg/dL (ref 8–23)
CO2: 25 mmol/L (ref 22–32)
Calcium: 8.5 mg/dL — ABNORMAL LOW (ref 8.9–10.3)
Chloride: 110 mmol/L (ref 98–111)
Creatinine, Ser: 0.76 mg/dL (ref 0.44–1.00)
GFR, Estimated: >60 mL/min (ref >60)
Glucose, Bld: 88 mg/dL (ref 70–99)
Potassium: 3.6 mmol/L (ref 3.5–5.1)
Sodium: 141 mmol/L (ref 135–145)

## 2024-03-08 MED ORDER — PRASUGREL HCL 10 MG PO TABS: 10.0000 mg | ORAL_TABLET | Freq: Every day | ORAL | 0 refills | Status: DC

## 2024-03-08 NOTE — Plan of Care (Signed)
  Problem: Activity: Goal: Ability to return to baseline activity level will improve Outcome: Progressing   Problem: Education: Goal: Knowledge of General Education information will improve Description: Including pain rating scale, medication(s)/side effects and non-pharmacologic comfort measures Outcome: Progressing   Problem: Clinical Measurements: Goal: Will remain free from infection Outcome: Progressing   Problem: Activity: Goal: Risk for activity intolerance will decrease Outcome: Progressing

## 2024-03-08 NOTE — Discharge Summary (Signed)
 Discharge Summary      Patient ID: Hannah Kaiser MRN: [id number] DOB/AGE: Feb 19, 1961 62 y.o.  Admit date: 03/07/2024 Discharge date: 03/08/2024  Primary Discharge Diagnosis Unstable angina Secondary Discharge Diagnosis: S/p percutaneous coronary intervention  Significant Diagnostic Studies: Left heart catheterization with percutaneous coronary intervention   Consults: None  Hospital Course: The patient was brought to the cardiac cath lab and underwent left heart catheterization and coronary angiography with Dr. Fernand and Dr. Ammon on 03/07/24. The patient tolerated with procedure well without complications.  The patient received 3.0 x 15 mm Onyx Frontier DES  to the proximal LAD . On 03/08/2024 the R groin access site was examined and found to be without significant erythema, tenderness to palpation, or apparent aneurysm. Hospital course was overall uneventful, on day of discharge the patient was ambulatory and eager to go home.  Discussed cardiac rehab and new prescription medications in detail. The patient was given aftercare instructions and ER return precautions. Will arrange for follow up in office in 1 week, or sooner if needed. Appointment date/time listed below and documented in Provider Navigator tab.  Discharge Exam: Blood pressure 111/64, pulse (!) 57, temperature 98.3 F (36.8 C), resp. rate 17, height 5' 4 (1.626 m), weight 110.7 kg, SpO2 100%.   General: Well appearing female, well nourished, in no acute distress.  HEENT: Normocephalic and atraumatic. Neck: No JVD.  Lungs: Normal respiratory effort on 2L [place].  Clear to ascultation bilaterally. Heart: HRRR . Normal S1 and S2 without gallops or murmurs.  Abdomen: Non-distended appearing.  Msk: Normal strength and tone for age. Extremities: No peripheral edema. R groin access site without significant tenderness to palpation, apparent aneurysm, or significant ecchymosis. Covered with clean gauze and tegaderm dressing.   Neuro: Alert and oriented x3 Psych: Calm and cooperative.   Labs: Lab Results  Component Value Date   WBC 5.8 03/08/2024   HGB 11.1 (L) 03/08/2024   HCT 33.9 (L) 03/08/2024   MCV 86.5 03/08/2024   PLT 203 03/08/2024    Recent Labs  Lab 03/08/24 0432  NA 141  K 3.6  CL 110  CO2 25  BUN 13  CREATININE 0.76  CALCIUM  8.5*  GLUCOSE 88      Radiology: None EKG: Sinus bradycardia rate 56 bpm  FOLLOW UP PLANS AND APPOINTMENTS Discharge Instructions     AMB Referral to Cardiac Rehabilitation - Phase II   Complete by: As directed    Diagnosis: Coronary Stents   After initial evaluation and assessments completed: Virtual Based Care may be provided alone or in conjunction with Phase 2 Cardiac Rehab based on patient barriers.: Yes   Intensive Cardiac Rehabilitation (ICR) [facility] location only OR Traditional Cardiac Rehabilitation (TCR) *If criteria for ICR are not met will enroll in TCR ([facility] only): Yes      Allergies as of 03/08/2024       Reactions   Aripiprazole  Swelling   Whole  body swelling, retains fluid   Bupropion Other (See Comments)   Worsened depression   Ace Inhibitors Cough   So bad she vomited Product containing angiotensin-converting enzyme inhibitor (product)   Paxlovid [nirmatrelvir-ritonavir] Other (See Comments)   Ulcers in mouth   Statins Other (See Comments)   myalgia   Azithromycin Rash, Dermatitis   Erythromycin Rash        Medication List     TAKE these medications    albuterol  108 (90 Base) MCG/ACT inhaler Commonly known as: VENTOLIN  HFA Inhale 1-2 puffs into the  lungs every 6 (six) hours as needed for wheezing or shortness of breath.   amLODipine  10 MG tablet Commonly known as: NORVASC  Take 1 tablet (10 mg total) by mouth daily.   aspirin  EC 81 MG tablet Take 1 tablet (81 mg total) by mouth daily. Swallow whole.   atenolol  50 MG tablet Commonly known as: TENORMIN  Take 1 tablet (50 mg total) by mouth daily.   Breztri  Aerosphere  160-9-4.8 MCG/ACT Aero inhaler Generic drug: budesonide -glycopyrrolate -formoterol  Inhale 2 puffs into the lungs 2 (two) times daily.   busPIRone  15 MG tablet Commonly known as: BUSPAR  Take 1 tablet (15 mg total) by mouth 3 (three) times daily.   clonazePAM  1 MG tablet Commonly known as: KLONOPIN  Take 1 tablet (1 mg total) by mouth 2 (two) times daily as needed. for anxiety   ezetimibe  10 MG tablet Commonly known as: ZETIA  Take 1 tablet (10 mg total) by mouth daily.   FLUoxetine  40 MG capsule Commonly known as: PROZAC  Take 2 capsules (80 mg total) by mouth daily.   furosemide  20 MG tablet Commonly known as: LASIX  TAKE 1 TABLET BY MOUTH ONCE DAILY AS NEEDED FOR SWELLING What changed:  how much to take how to take this when to take this additional instructions   hydrOXYzine  25 MG tablet Commonly known as: ATARAX  Take 1 tablet (25 mg total) by mouth 3 (three) times daily as needed.   ibuprofen  800 MG tablet Commonly known as: ADVIL  Take 1 tablet (800 mg total) by mouth every 8 (eight) hours as needed.   isosorbide  mononitrate 60 MG 24 hr tablet Commonly known as: IMDUR  Take 1 tablet (60 mg total) by mouth daily.   losartan  100 MG tablet Commonly known as: COZAAR  Take 1 tablet (100 mg total) by mouth daily.   mupirocin  ointment 2 % Commonly known as: BACTROBAN  Apply 1 application. topically 2 (two) times daily. What changed:  when to take this reasons to take this   nystatin -triamcinolone  ointment Commonly known as: MYCOLOG Apply 1 application topically 2 (two) times daily. What changed:  when to take this reasons to take this   ondansetron  4 MG tablet Commonly known as: Zofran  Take 1 tablet (4 mg total) by mouth every 8 (eight) hours as needed for nausea or vomiting.   pantoprazole  40 MG tablet Commonly known as: PROTONIX  Take 1 tablet (40 mg total) by mouth 2 (two) times daily.   prasugrel 10 MG Tabs tablet Commonly known as: EFFIENT Take 1 tablet (10  mg total) by mouth daily. Start taking on: March 09, 2024   QUEtiapine  Fumarate 150 MG 24 hr tablet Commonly known as: SEROquel  XR Take 1 tablet (150 mg total) by mouth at bedtime. What changed: additional instructions   ranolazine  500 MG 12 hr tablet Commonly known as: RANEXA  TAKE 1 TABLET BY MOUTH TWICE A DAY   Repatha  SureClick 140 MG/ML Soaj Generic drug: Evolocumab  Inject 140 mg into the skin every 14 (fourteen) days.   tiZANidine  4 MG tablet Commonly known as: ZANAFLEX  Take 1 tablet (4 mg total) by mouth every 8 (eight) hours as needed for muscle spasms.   triamcinolone  ointment 0.5 % Commonly known as: KENALOG  Apply 1 application topically 2 (two) times daily. What changed:  when to take this reasons to take this   valACYclovir  1000 MG tablet Commonly known as: VALTREX  Take 1 tablet (1,000 mg total) by mouth daily. What changed:  when to take this reasons to take this   Vitamin D  (Ergocalciferol ) 1.25 MG (50000 UNIT)  Caps capsule Commonly known as: DRISDOL  TAKE 1 CAPSULE BY MOUTH ONE TIME PER WEEK   Wegovy  0.25 MG/0.5ML Soaj SQ injection Generic drug: semaglutide -weight management Inject 0.25 mg into the skin once a week.   Zepbound  5 MG/0.5ML Pen Generic drug: tirzepatide  Inject 5 mg into the skin once a week.        Follow up appointment: 03/09/24 with Dr. Fernand ARMS CHECKLIST Aspirin  prescribed? Yes ADP Receptor Inhibitor prescribed? Yes - Effient Statin prescribed? No - hx statin intolerance on zetia  and repatha  Beta-blocker prescribed? Yes Cardiac rehab ordered? Yes   PLEASE BRING ALL MEDICATIONS WITH YOU TO FOLLOW UP APPOINTMENTS  Time spent with patient: 22  Signed: Danita Bloch PA-C 03/08/2024, 9:18 AM [facility]

## 2024-03-08 NOTE — Progress Notes (Signed)
   03/08/24 0740  Spiritual Encounters  Type of Visit Initial  Care provided to: Patient  Referral source Other (comment) (Spiritual Consult)  Reason for visit Advance directives  OnCall Visit Yes  Advance Directives (For Healthcare)  Does Patient Have a Medical Advance Directive? No  Would patient like information on creating a medical advance directive? Yes (Inpatient - patient defers creating a medical advance directive and declines information at this time) (Gave Pt info; Pt will ask nurse to page the Chaplain when she's ready to sign)

## 2024-03-08 NOTE — TOC CM/SW Note (Signed)
 Transition of Care [facility]) - Inpatient Brief Assessment   Patient Details  Name: Hannah Kaiser MRN: [id number] Date of Birth: [birth year]  [facility] of [facility] CM/SW Contact:    Lauraine JAYSON Carpen, LCSW Phone Number: 03/08/2024, 10:08 AM   Clinical Narrative: Patient has orders to discharge home today. Chart reviewed. No TOC needs identified. CSW signing off.  Transition of Care Asessment: Insurance and Status: Insurance coverage has been reviewed Patient has primary care physician: Yes Home environment has been reviewed: Single family home Prior level of function:: Not documented Prior/Current Home Services: No current home services Social Drivers of Health Review: SDOH reviewed no interventions necessary Readmission risk has been reviewed: Yes Transition of care needs: no transition of care needs at this time

## 2024-03-08 NOTE — Progress Notes (Signed)
 Patient expressed understanding of discharge instructions and return protocols. Patient will follow up with providers as scheduled. Patient PIVs removed and patient transported to d/c lounge by [place], NT

## 2024-03-09 ENCOUNTER — Ambulatory Visit: Admitting: Cardiovascular Disease

## 2024-03-09 ENCOUNTER — Encounter: Payer: Self-pay | Admitting: Cardiovascular Disease

## 2024-03-09 ENCOUNTER — Ambulatory Visit: Payer: Self-pay | Admitting: Family Medicine

## 2024-03-09 ENCOUNTER — Other Ambulatory Visit: Payer: Self-pay | Admitting: *Deleted

## 2024-03-09 VITALS — BP 120/78 | HR 60 | Ht 64.0 in | Wt 244.4 lb

## 2024-03-09 DIAGNOSIS — I25118 Atherosclerotic heart disease of native coronary artery with other forms of angina pectoris: Secondary | ICD-10-CM

## 2024-03-09 DIAGNOSIS — I200 Unstable angina: Secondary | ICD-10-CM

## 2024-03-09 DIAGNOSIS — R0602 Shortness of breath: Secondary | ICD-10-CM

## 2024-03-09 DIAGNOSIS — R0789 Other chest pain: Secondary | ICD-10-CM

## 2024-03-09 DIAGNOSIS — Z131 Encounter for screening for diabetes mellitus: Secondary | ICD-10-CM

## 2024-03-09 DIAGNOSIS — E782 Mixed hyperlipidemia: Secondary | ICD-10-CM

## 2024-03-09 DIAGNOSIS — F17210 Nicotine dependence, cigarettes, uncomplicated: Secondary | ICD-10-CM | POA: Diagnosis not present

## 2024-03-09 DIAGNOSIS — I25112 Atherosclerotic heart disease of native coronary artery with refractory angina pectoris: Secondary | ICD-10-CM | POA: Diagnosis not present

## 2024-03-09 DIAGNOSIS — Z0130 Encounter for examination of blood pressure without abnormal findings: Secondary | ICD-10-CM

## 2024-03-09 DIAGNOSIS — K219 Gastro-esophageal reflux disease without esophagitis: Secondary | ICD-10-CM

## 2024-03-09 DIAGNOSIS — Z9861 Coronary angioplasty status: Secondary | ICD-10-CM

## 2024-03-09 LAB — LIPOPROTEIN A (LPA): Lipoprotein (a): <8.4 nmol/L (ref <75.0)

## 2024-03-09 MED ORDER — NICOTINE 21 MG/24HR TD PT24: 21.0000 mg | MEDICATED_PATCH | TRANSDERMAL | 1 refills | Status: DC

## 2024-03-09 MED ORDER — PRASUGREL HCL 10 MG PO TABS: 10.0000 mg | ORAL_TABLET | Freq: Every day | ORAL | 0 refills | Status: AC

## 2024-03-09 MED ORDER — FUROSEMIDE 20 MG PO TABS: ORAL_TABLET | ORAL | 3 refills | Status: AC

## 2024-03-09 MED ORDER — ASPIRIN 81 MG PO TBEC: 81.0000 mg | DELAYED_RELEASE_TABLET | Freq: Every day | ORAL | 12 refills | Status: AC

## 2024-03-09 NOTE — Progress Notes (Signed)
 Cardiology Office Note   Date:  03/09/2024   ID:  Hannah Kaiser, DOB Jan 23, 1961, MRN [id number]  PCP:  Vicci Duwaine SQUIBB, DO  Cardiologist:  Denyse Bathe, MD      History of Present Illness: Hannah Kaiser is a 63 y.o. female who presents for  Chief Complaint  Patient presents with   Follow-up    Cath follow up    Had cardiac cath 2 days ago, it showed 90%disease of LAD, and now after PCI/Stents is 0%. Chest pain is infrequent.      Past Medical History:  Diagnosis Date   Anemia    Anxiety    Arthritis    knees, thoracic spurs and arthritis   Asthma    Breast pain    CHF (congestive heart failure) (HCC)    Colon polyps    COPD (chronic obstructive pulmonary disease) (HCC)    Depression    Dysrhythmia    GERD (gastroesophageal reflux disease)    Glaucoma    Heart murmur    History of blood transfusion    Hypertension    Motion sickness    cars   Multilevel degenerative disc disease    Neuropathy    Bilateral arms and legs   Nipple discharge    Numbness of both lower extremities    bulging lumbar discs - per pt   Numbness of upper extremity    bilateral - degenerative cervical discs - per pt   Pneumonia    PONV (postoperative nausea and vomiting)    Sleep apnea    has CPAP   Smokers' cough (HCC)    Swelling of limb 03/11/2021   Wears dentures    full upper and lower     Past Surgical History:  Procedure Laterality Date   ABDOMINAL HYSTERECTOMY     BACK SURGERY     BELPHAROPTOSIS REPAIR     BROW LIFT Bilateral 03/24/2016   Procedure: BLEPHAROPLASTY;  Surgeon: Greig CHRISTELLA Gay, MD;  Location: [facility];  Service: Ophthalmology;  Laterality: Bilateral;  sleep apnea   CARDIAC CATHETERIZATION     COLONOSCOPY WITH PROPOFOL  N/A 10/19/2016   Procedure: COLONOSCOPY WITH PROPOFOL ;  Surgeon: Jinny Carmine, MD;  Location: [facility];  Service: Endoscopy;  Laterality: N/A;   COLONOSCOPY WITH PROPOFOL  N/A 05/12/2022   Procedure: COLONOSCOPY WITH  PROPOFOL ;  Surgeon: Jinny Carmine, MD;  Location: [facility] ENDOSCOPY;  Service: Endoscopy;  Laterality: N/A;   CORONARY STENT INTERVENTION N/A 03/07/2024   Procedure: CORONARY STENT INTERVENTION;  Surgeon: Ammon Blunt, MD;  Location: [facility] INVASIVE CV LAB;  Service: Cardiovascular;  Laterality: N/A;   DILATION AND CURETTAGE OF UTERUS     ECTOPIC PREGNANCY SURGERY     x2   ESOPHAGOGASTRODUODENOSCOPY (EGD) WITH PROPOFOL  N/A 10/19/2016   Procedure: ESOPHAGOGASTRODUODENOSCOPY (EGD) WITH PROPOFOL ;  Surgeon: Jinny Carmine, MD;  Location: [facility];  Service: Endoscopy;  Laterality: N/A;   LEFT HEART CATH AND CORONARY ANGIOGRAPHY Left 12/29/2016   Procedure: Left Heart Cath and Coronary Angiography;  Surgeon: Bathe Denyse LABOR, MD;  Location: [facility] INVASIVE CV LAB;  Service: Cardiovascular;  Laterality: Left;   LEFT HEART CATH AND CORONARY ANGIOGRAPHY Left 03/07/2024   Procedure: LEFT HEART CATH AND CORONARY ANGIOGRAPHY with possible coronary intervention;  Surgeon: Bathe Denyse LABOR, MD;  Location: [facility] INVASIVE CV LAB;  Service: Cardiovascular;  Laterality: Left;   NECK SURGERY  2003   POLYPECTOMY  10/19/2016   Procedure: POLYPECTOMY;  Surgeon: Jinny Carmine, MD;  Location:  [facility];  Service: Endoscopy;;   SPINE SURGERY  1989, 1998   TOTAL ABDOMINAL HYSTERECTOMY W/ BILATERAL SALPINGOOPHORECTOMY  2014   TUBAL LIGATION       Current Outpatient Medications  Medication Sig Dispense Refill   albuterol  (VENTOLIN  HFA) 108 (90 Base) MCG/ACT inhaler Inhale 1-2 puffs into the lungs every 6 (six) hours as needed for wheezing or shortness of breath. 18 each 1   amLODipine  (NORVASC ) 10 MG tablet Take 1 tablet (10 mg total) by mouth daily. 90 tablet 1   atenolol  (TENORMIN ) 50 MG tablet Take 1 tablet (50 mg total) by mouth daily. 90 tablet 1   budesonide -glycopyrrolate -formoterol  (BREZTRI  AEROSPHERE) 160-9-4.8 MCG/ACT AERO inhaler Inhale 2 puffs into the lungs 2 (two) times daily.      busPIRone  (BUSPAR ) 15 MG tablet Take 1 tablet (15 mg total) by mouth 3 (three) times daily. 270 tablet 1   clonazePAM  (KLONOPIN ) 1 MG tablet Take 1 tablet (1 mg total) by mouth 2 (two) times daily as needed. for anxiety 60 tablet 0   Evolocumab  (REPATHA  SURECLICK) 140 MG/ML SOAJ Inject 140 mg into the skin every 14 (fourteen) days. 2 mL 2   ezetimibe  (ZETIA ) 10 MG tablet Take 1 tablet (10 mg total) by mouth daily. 90 tablet 1   FLUoxetine  (PROZAC ) 40 MG capsule Take 2 capsules (80 mg total) by mouth daily. 180 capsule 1   hydrOXYzine  (ATARAX ) 25 MG tablet Take 1 tablet (25 mg total) by mouth 3 (three) times daily as needed. 270 tablet 1   isosorbide  mononitrate (IMDUR ) 60 MG 24 hr tablet Take 1 tablet (60 mg total) by mouth daily. 90 tablet 1   losartan  (COZAAR ) 100 MG tablet Take 1 tablet (100 mg total) by mouth daily. 90 tablet 1   mupirocin  ointment (BACTROBAN ) 2 % Apply 1 application. topically 2 (two) times daily. (Patient taking differently: Apply 1 application  topically daily as needed (Breakouts).) 22 g 0   nicotine (NICODERM CQ - DOSED IN MG/24 HOURS) 21 mg/24hr patch Place 1 patch (21 mg total) onto the skin daily. 30 patch 1   nystatin -triamcinolone  ointment (MYCOLOG) Apply 1 application topically 2 (two) times daily. (Patient taking differently: Apply 1 application  topically 2 (two) times daily as needed (sores that do not heal).) 30 g 1   ondansetron  (ZOFRAN ) 4 MG tablet Take 1 tablet (4 mg total) by mouth every 8 (eight) hours as needed for nausea or vomiting. 60 tablet 3   pantoprazole  (PROTONIX ) 40 MG tablet Take 1 tablet (40 mg total) by mouth 2 (two) times daily. 180 tablet 1   QUEtiapine  Fumarate (SEROQUEL  XR) 150 MG 24 hr tablet Take 1 tablet (150 mg total) by mouth at bedtime. (Patient taking differently: Take 150 mg by mouth at bedtime. Taking (3) 50 tablets) 90 tablet 0   ranolazine  (RANEXA ) 500 MG 12 hr tablet TAKE 1 TABLET BY MOUTH TWICE A DAY 180 tablet 0    semaglutide -weight management (WEGOVY ) 0.25 MG/0.5ML SOAJ SQ injection Inject 0.25 mg into the skin once a week. 2 mL 0   tiZANidine  (ZANAFLEX ) 4 MG tablet Take 1 tablet (4 mg total) by mouth every 8 (eight) hours as needed for muscle spasms. 90 tablet 1   triamcinolone  ointment (KENALOG ) 0.5 % Apply 1 application topically 2 (two) times daily. (Patient taking differently: Apply 1 application  topically daily as needed (Breakout).) 30 g 0   valACYclovir  (VALTREX ) 1000 MG tablet Take 1 tablet (1,000 mg total) by mouth daily. (Patient  taking differently: Take 1,000 mg by mouth daily as needed (Breakout).) 180 tablet 1   Vitamin D , Ergocalciferol , (DRISDOL ) 1.25 MG (50000 UNIT) CAPS capsule TAKE 1 CAPSULE BY MOUTH ONE TIME PER WEEK 12 capsule 0   aspirin  EC 81 MG tablet Take 1 tablet (81 mg total) by mouth daily. Swallow whole. 30 tablet 12   furosemide  (LASIX ) 20 MG tablet TAKE 1 TABLET BY MOUTH ONCE DAILY AS NEEDED FOR SWELLING 60 tablet 3   prasugrel (EFFIENT) 10 MG TABS tablet Take 1 tablet (10 mg total) by mouth daily. 30 tablet 0   No current facility-administered medications for this visit.    Allergies:   Aripiprazole , Bupropion, Ace inhibitors, Paxlovid [nirmatrelvir-ritonavir], Statins, Azithromycin, and Erythromycin    Social History:   reports that she has been smoking cigarettes. She has a 61.5 pack-year smoking history. She has never used smokeless tobacco. She reports that she does not drink alcohol and does not use drugs.   Family History:  family history includes Cancer in her brother, maternal grandfather, and maternal uncle.    ROS:     Review of Systems  Constitutional: Negative.   HENT: Negative.    Eyes: Negative.   Respiratory: Negative.    Gastrointestinal: Negative.   Genitourinary: Negative.   Musculoskeletal: Negative.   Skin: Negative.   Neurological: Negative.   Endo/Heme/Allergies: Negative.   Psychiatric/Behavioral: Negative.    All other systems reviewed  and are negative.     All other systems are reviewed and negative.    PHYSICAL EXAM: VS:  BP 120/78   Pulse 60   Ht 5' 4 (1.626 m)   Wt 244 lb 6.4 oz (110.9 kg)   SpO2 95%   BMI 41.95 kg/m  , BMI Body mass index is 41.95 kg/m. Last weight:  Wt Readings from Last 3 Encounters:  03/09/24 244 lb 6.4 oz (110.9 kg)  03/07/24 244 lb (110.7 kg)  03/06/24 246 lb 12.8 oz (111.9 kg)     Physical Exam Constitutional:      Appearance: Normal appearance.  Cardiovascular:     Rate and Rhythm: Normal rate and regular rhythm.     Heart sounds: Normal heart sounds.  Pulmonary:     Effort: Pulmonary effort is normal.     Breath sounds: Normal breath sounds.  Musculoskeletal:     Right lower leg: No edema.     Left lower leg: No edema.  Neurological:     Mental Status: She is alert.       EKG:   Recent Labs: 12/27/2023: ALT 13; TSH 1.780 03/08/2024: BUN 13; Creatinine, Ser 0.76; Hemoglobin 11.1; Platelets 203; Potassium 3.6; Sodium 141    Lipid Panel    Component Value Date/Time   CHOL 227 (H) 10/19/2023 0926   TRIG 135 10/19/2023 0926   HDL 53 10/19/2023 0926   CHOLHDL 4.7 (H) 08/30/2020 0904   LDLCALC 150 (H) 10/19/2023 9073      Other studies Reviewed: Additional studies/ records that were reviewed today include:  Review of the above records demonstrates:       No data to display            ASSESSMENT AND PLAN:    ICD-10-CM   1. Coronary artery disease of native artery of native heart with stable angina pectoris  I25.118 prasugrel (EFFIENT) 10 MG TABS tablet    furosemide  (LASIX ) 20 MG tablet    nicotine (NICODERM CQ - DOSED IN MG/24 HOURS) 21 mg/24hr patch  2. SOB (shortness of breath)  R06.02 prasugrel (EFFIENT) 10 MG TABS tablet    furosemide  (LASIX ) 20 MG tablet    nicotine (NICODERM CQ - DOSED IN MG/24 HOURS) 21 mg/24hr patch    aspirin  EC 81 MG tablet    3. Other chest pain  R07.89 prasugrel (EFFIENT) 10 MG TABS tablet    furosemide  (LASIX )  20 MG tablet    nicotine (NICODERM CQ - DOSED IN MG/24 HOURS) 21 mg/24hr patch    aspirin  EC 81 MG tablet    4. Unstable angina (HCC)  I20.0 prasugrel (EFFIENT) 10 MG TABS tablet    furosemide  (LASIX ) 20 MG tablet    nicotine (NICODERM CQ - DOSED IN MG/24 HOURS) 21 mg/24hr patch    5. Nicotine dependence, cigarettes, uncomplicated  F17.210 prasugrel (EFFIENT) 10 MG TABS tablet    furosemide  (LASIX ) 20 MG tablet    nicotine (NICODERM CQ - DOSED IN MG/24 HOURS) 21 mg/24hr patch   add nicoderm    6. Mixed hyperlipidemia  E78.2 prasugrel (EFFIENT) 10 MG TABS tablet    furosemide  (LASIX ) 20 MG tablet    nicotine (NICODERM CQ - DOSED IN MG/24 HOURS) 21 mg/24hr patch    aspirin  EC 81 MG tablet    7. Status post percutaneous coronary intervention (PCI)  Z98.61 prasugrel (EFFIENT) 10 MG TABS tablet    furosemide  (LASIX ) 20 MG tablet    nicotine (NICODERM CQ - DOSED IN MG/24 HOURS) 21 mg/24hr patch   PCI of proximal LAD 90% reduceed to 0% after stenting. Need to quit smoking. ASP/prasgruil, high intensity statins. Right groin no hematoma.    8. Gastroesophageal reflux disease, unspecified whether esophagitis present  K21.9 prasugrel (EFFIENT) 10 MG TABS tablet    furosemide  (LASIX ) 20 MG tablet    nicotine (NICODERM CQ - DOSED IN MG/24 HOURS) 21 mg/24hr patch    9. Other chest pain  R07.89 prasugrel (EFFIENT) 10 MG TABS tablet    furosemide  (LASIX ) 20 MG tablet    nicotine (NICODERM CQ - DOSED IN MG/24 HOURS) 21 mg/24hr patch    aspirin  EC 81 MG tablet   Patient had a nuclear stress test by [facility] Dr. Gollan which showed no evidence of ischemia with normal ejection fraction.  Add Ranexa     10. Coronary artery disease involving native coronary artery of native heart with refractory angina pectoris  I25.112 aspirin  EC 81 MG tablet   Patient has history of 40% left circumflex and 40% mid RCA lesion in 2018 cardiac catheterization.  Nuclear stress test by Dr. Perla was  negative. CCTA needed    11. SOB (shortness of breath)  R06.02 prasugrel (EFFIENT) 10 MG TABS tablet    furosemide  (LASIX ) 20 MG tablet    nicotine (NICODERM CQ - DOSED IN MG/24 HOURS) 21 mg/24hr patch    aspirin  EC 81 MG tablet   Patient continues to have shortness of breath thus will be evaluated by doing echocardiogram.       Problem List Items Addressed This Visit       Cardiovascular and Mediastinum   CAD (coronary artery disease) - Primary   Relevant Medications   prasugrel (EFFIENT) 10 MG TABS tablet   furosemide  (LASIX ) 20 MG tablet   nicotine (NICODERM CQ - DOSED IN MG/24 HOURS) 21 mg/24hr patch   aspirin  EC 81 MG tablet   Unstable angina (HCC)   Relevant Medications   prasugrel (EFFIENT) 10 MG TABS tablet   furosemide  (LASIX ) 20 MG tablet  nicotine (NICODERM CQ - DOSED IN MG/24 HOURS) 21 mg/24hr patch   aspirin  EC 81 MG tablet     Digestive   GERD (gastroesophageal reflux disease)   Relevant Medications   prasugrel (EFFIENT) 10 MG TABS tablet   furosemide  (LASIX ) 20 MG tablet   nicotine (NICODERM CQ - DOSED IN MG/24 HOURS) 21 mg/24hr patch     Other   Hyperlipidemia   Relevant Medications   prasugrel (EFFIENT) 10 MG TABS tablet   furosemide  (LASIX ) 20 MG tablet   nicotine (NICODERM CQ - DOSED IN MG/24 HOURS) 21 mg/24hr patch   aspirin  EC 81 MG tablet   Nicotine dependence, cigarettes, uncomplicated   Relevant Medications   prasugrel (EFFIENT) 10 MG TABS tablet   furosemide  (LASIX ) 20 MG tablet   nicotine (NICODERM CQ - DOSED IN MG/24 HOURS) 21 mg/24hr patch   Other chest pain   Relevant Medications   prasugrel (EFFIENT) 10 MG TABS tablet   furosemide  (LASIX ) 20 MG tablet   nicotine (NICODERM CQ - DOSED IN MG/24 HOURS) 21 mg/24hr patch   aspirin  EC 81 MG tablet   SOB (shortness of breath)   Relevant Medications   prasugrel (EFFIENT) 10 MG TABS tablet   furosemide  (LASIX ) 20 MG tablet   nicotine (NICODERM CQ - DOSED IN MG/24 HOURS) 21 mg/24hr patch    aspirin  EC 81 MG tablet   Other Visit Diagnoses       Status post percutaneous coronary intervention (PCI)       PCI of proximal LAD 90% reduceed to 0% after stenting. Need to quit smoking. ASP/prasgruil, high intensity statins. Right groin no hematoma.   Relevant Medications   prasugrel (EFFIENT) 10 MG TABS tablet   furosemide  (LASIX ) 20 MG tablet   nicotine (NICODERM CQ - DOSED IN MG/24 HOURS) 21 mg/24hr patch          Disposition:   Return in about 4 weeks (around 04/06/2024).    Total time spent: 50 minutes  Signed,  Denyse Bathe, MD  03/09/2024 2:01 PM    [facility]

## 2024-03-13 ENCOUNTER — Emergency Department

## 2024-03-13 ENCOUNTER — Emergency Department: Admission: EM | Admit: 2024-03-13 | Discharge: 2024-03-14 | Disposition: A | Discharge status: Home / Self Care

## 2024-03-13 ENCOUNTER — Other Ambulatory Visit: Payer: Self-pay

## 2024-03-13 DIAGNOSIS — Z955 Presence of coronary angioplasty implant and graft: Secondary | ICD-10-CM | POA: Insufficient documentation

## 2024-03-13 DIAGNOSIS — R079 Chest pain, unspecified: Secondary | ICD-10-CM

## 2024-03-13 DIAGNOSIS — R0789 Other chest pain: Secondary | ICD-10-CM | POA: Insufficient documentation

## 2024-03-13 DIAGNOSIS — I509 Heart failure, unspecified: Secondary | ICD-10-CM | POA: Diagnosis not present

## 2024-03-13 DIAGNOSIS — J449 Chronic obstructive pulmonary disease, unspecified: Secondary | ICD-10-CM | POA: Diagnosis not present

## 2024-03-13 DIAGNOSIS — I110 Hypertensive heart disease with heart failure: Secondary | ICD-10-CM | POA: Diagnosis not present

## 2024-03-13 DIAGNOSIS — I2510 Atherosclerotic heart disease of native coronary artery without angina pectoris: Secondary | ICD-10-CM | POA: Insufficient documentation

## 2024-03-13 DIAGNOSIS — R1013 Epigastric pain: Secondary | ICD-10-CM | POA: Insufficient documentation

## 2024-03-13 LAB — CBC
HCT: 35.6 % — ABNORMAL LOW (ref 36.0–46.0)
Hemoglobin: 11.8 g/dL — ABNORMAL LOW (ref 12.0–15.0)
MCH: 28.9 pg (ref 26.0–34.0)
MCHC: 33.1 g/dL (ref 30.0–36.0)
MCV: 87.3 fL (ref 80.0–100.0)
Platelets: 260 K/uL (ref 150–400)
RBC: 4.08 MIL/uL (ref 3.87–5.11)
RDW: 13.7 % (ref 11.5–15.5)
WBC: 8 K/uL (ref 4.0–10.5)
nRBC: 0 % (ref 0.0–0.2)

## 2024-03-13 LAB — URINALYSIS, COMPLETE (UACMP) WITH MICROSCOPIC
Bilirubin Urine: NEGATIVE
Glucose, UA: NEGATIVE mg/dL
Hgb urine dipstick: NEGATIVE
Ketones, ur: NEGATIVE mg/dL
Leukocytes,Ua: NEGATIVE
Nitrite: NEGATIVE
Protein, ur: NEGATIVE mg/dL
Specific Gravity, Urine: 1.009 (ref 1.005–1.030)
pH: 6 (ref 5.0–8.0)

## 2024-03-13 LAB — TROPONIN I (HIGH SENSITIVITY)
Troponin I (High Sensitivity): 16 ng/L (ref <18)
Troponin I (High Sensitivity): 17 ng/L (ref <18)
Troponin I (High Sensitivity): 14 ng/L (ref <18)

## 2024-03-13 LAB — LIPASE, BLOOD: Lipase: 25 U/L (ref 11–51)

## 2024-03-13 LAB — HEPATIC FUNCTION PANEL
ALT: 11 U/L (ref 0–44)
AST: 20 U/L (ref 15–41)
Albumin: 4.3 g/dL (ref 3.5–5.0)
Alkaline Phosphatase: 51 U/L (ref 38–126)
Bilirubin, Direct: <0.1 mg/dL (ref 0.0–0.2)
Total Bilirubin: 0.8 mg/dL (ref 0.0–1.2)
Total Protein: 7.3 g/dL (ref 6.5–8.1)

## 2024-03-13 LAB — BASIC METABOLIC PANEL WITH GFR
Anion gap: 11 (ref 5–15)
BUN: 17 mg/dL (ref 8–23)
CO2: 24 mmol/L (ref 22–32)
Calcium: 9.1 mg/dL (ref 8.9–10.3)
Chloride: 104 mmol/L (ref 98–111)
Creatinine, Ser: 0.78 mg/dL (ref 0.44–1.00)
GFR, Estimated: >60 mL/min (ref >60)
Glucose, Bld: 99 mg/dL (ref 70–99)
Potassium: 4.1 mmol/L (ref 3.5–5.1)
Sodium: 139 mmol/L (ref 135–145)

## 2024-03-13 MED ORDER — ACETAMINOPHEN 500 MG PO TABS
1000.0000 mg | ORAL_TABLET | Freq: Once | ORAL | Status: AC
  Administered 2024-03-13: 1000 mg via ORAL
  Filled 2024-03-13: qty 2

## 2024-03-13 MED ORDER — ONDANSETRON HCL 4 MG/2ML IJ SOLN
4.0000 mg | Freq: Once | INTRAMUSCULAR | Status: AC
  Administered 2024-03-13: 4 mg via INTRAVENOUS
  Filled 2024-03-13: qty 2

## 2024-03-13 MED ORDER — AMOXICILLIN-POT CLAVULANATE 875-125 MG PO TABS
1.0000 | ORAL_TABLET | Freq: Once | ORAL | Status: AC
  Administered 2024-03-13: 1 via ORAL
  Filled 2024-03-13: qty 1

## 2024-03-13 MED ORDER — AMOXICILLIN-POT CLAVULANATE 875-125 MG PO TABS
1.0000 | ORAL_TABLET | Freq: Two times a day (BID) | ORAL | 0 refills | Status: AC
Start: 2024-03-14 — End: 2024-03-21

## 2024-03-13 MED ORDER — LIDOCAINE VISCOUS HCL 2 % MT SOLN
15.0000 mL | Freq: Once | OROMUCOSAL | Status: AC
  Administered 2024-03-13: 15 mL via ORAL
  Filled 2024-03-13: qty 15

## 2024-03-13 MED ORDER — ALUMINUM-MAGNESIUM-SIMETHICONE 200-200-20 MG/5ML PO SUSP: 30.0000 mL | Freq: Three times a day (TID) | ORAL | 0 refills | Status: AC

## 2024-03-13 MED ORDER — IOHEXOL 350 MG/ML SOLN
100.0000 mL | Freq: Once | INTRAVENOUS | Status: AC | PRN
Start: 2024-03-13 — End: 2024-03-13
  Administered 2024-03-13: 100 mL via INTRAVENOUS

## 2024-03-13 MED ORDER — ALUM & MAG HYDROXIDE-SIMETH 200-200-20 MG/5ML PO SUSP
30.0000 mL | Freq: Once | ORAL | Status: AC
  Administered 2024-03-13: 30 mL via ORAL
  Filled 2024-03-13: qty 30

## 2024-03-13 MED ORDER — PANTOPRAZOLE SODIUM 40 MG IV SOLR
40.0000 mg | Freq: Once | INTRAVENOUS | Status: AC
  Administered 2024-03-13: 40 mg via INTRAVENOUS
  Filled 2024-03-13: qty 10

## 2024-03-13 NOTE — ED Notes (Signed)
 Pt came in due to pain following cath procedure that she had a couple of days ago. Pt describes pain as dull and not going away ever since procedure. Pt states nothing makes pain better. Pt has been experiencing nausea with white foamy stuff.

## 2024-03-13 NOTE — Discharge Instructions (Addendum)
 Your evaluation in the emergency department was overall reassuring.  I am unsure as to the exact cause of your pain, but we did see a possible pneumonia on our workup and have started you on antibiotics.  Continue to take your antacid, and have also prescribed you Maalox to use in addition.  Please follow-up with your primary care provider and cardiologist for reevaluation, and return to the emergency department with any new or worsening symptoms.

## 2024-03-13 NOTE — ED Triage Notes (Signed)
 Pt had a cardiac stent placed 4 days ago and c/o constant left sided rib/chest pain. Pt is CAOX4, NAD noted Pt reports SHOB and states she feels wobbly intermittently.

## 2024-03-13 NOTE — ED Notes (Signed)
 Pt stated the GI cocktail was ineffective but refused any further pain medication

## 2024-03-13 NOTE — ED Provider Notes (Signed)
 [facility] Provider Note    Event Date/Time   First MD Initiated Contact with Patient 03/13/24 1821     (approximate)   History   Post-op Problem and Chest Pain  Pt had a cardiac stent placed 4 days ago and c/o constant left sided rib/chest pain. Pt is CAOX4, NAD noted Pt reports SHOB and states she feels wobbly intermittently.     HPI Hannah Kaiser is a 63 y.o. female PMH GERD, COPD, CHF, hypertension, CAD with recent left heart catheterization (03/07/2024 with PCI of proximal LAD 90%, an ASA/prasugrel) presents for evaluation of left-sided chest discomfort -Per chart review, seen in cardiology clinic on 03/09/2024 by the provider that did her procedure, noted to be doing well - Today, patient states she has had ongoing lower chest / upper abdominal discomfort past few days.  Constant.  Not pleuritic.  No shortness of breath.  No vomiting.  Not positional.  Not clearly exertional.  Radiates to left flank.  No urinary symptoms.       Physical Exam   Triage Vital Signs: ED Triage Vitals [03/13/24 1624]  Encounter Vitals Group     BP 139/82     Girls Systolic BP Percentile      Girls Diastolic BP Percentile      Boys Systolic BP Percentile      Boys Diastolic BP Percentile      Pulse Rate 65     Resp 18     Temp 98.1 F (36.7 C)     Temp Source Oral     SpO2 98 %     Weight 244 lb 11.4 oz (111 kg)     Height 5' 4 (1.626 m)     Head Circumference      Peak Flow      Pain Score 7     Pain Loc      Pain Education      Exclude from Growth Chart     Most recent vital signs: Vitals:   03/13/24 2130 03/13/24 2255  BP: 128/84 134/76  Pulse: (!) 58 (!) 56  Resp: 15 16  Temp:    SpO2: 98% 100%     General: Awake, no distress.  CV:  Good peripheral perfusion. RRR, RP 2+ Resp:  Normal effort. CTAB Chest:  Mild tenderness to palpation throughout chest wall Abd:  No distention.  Mild tenderness to palpation throughout upper abdomen,  particularly in epigastrium   ED Results / Procedures / Treatments   Labs (all labs ordered are listed, but only abnormal results are displayed) Labs Reviewed  CBC - Abnormal; Notable for the following components:      Result Value   Hemoglobin 11.8 (*)    HCT 35.6 (*)    All other components within normal limits  URINALYSIS, COMPLETE (UACMP) WITH MICROSCOPIC - Abnormal; Notable for the following components:   Color, Urine YELLOW (*)    APPearance CLEAR (*)    Bacteria, UA RARE (*)    All other components within normal limits  BASIC METABOLIC PANEL WITH GFR  HEPATIC FUNCTION PANEL  LIPASE, BLOOD  TROPONIN I (HIGH SENSITIVITY)  TROPONIN I (HIGH SENSITIVITY)  TROPONIN I (HIGH SENSITIVITY)     EKG  See ED course below.    RADIOLOGY Chest x-ray interpreted by myself, no acute pathology identified.  Radiology report reviewed and in agreement.  CT chest and abdomen/pelvis interpreted myself radiology report reviewed.  Possible pneumonia.  No other acute pathology identified.  PROCEDURES:  Critical Care performed: No  Procedures   MEDICATIONS ORDERED IN ED: Medications  amoxicillin -clavulanate (AUGMENTIN ) 875-125 MG per tablet 1 tablet (has no administration in time range)  alum & mag hydroxide-simeth (MAALOX/MYLANTA) 200-200-20 MG/5ML suspension 30 mL (30 mLs Oral Given 03/13/24 1918)    And  lidocaine  (XYLOCAINE ) 2 % viscous mouth solution 15 mL (15 mLs Oral Given 03/13/24 1918)  acetaminophen  (TYLENOL ) tablet 1,000 mg (1,000 mg Oral Given 03/13/24 1917)  pantoprazole  (PROTONIX ) injection 40 mg (40 mg Intravenous Given 03/13/24 1918)  ondansetron  (ZOFRAN ) injection 4 mg (4 mg Intravenous Given 03/13/24 1918)  iohexol  (OMNIPAQUE ) 350 MG/ML injection 100 mL (100 mLs Intravenous Contrast Given 03/13/24 2200)     IMPRESSION / MDM / ASSESSMENT AND PLAN / ED COURSE  I reviewed the triage vital signs and the nursing notes.                               DDX/MDM/AP: Differential diagnosis includes, but is not limited to, dyspepsia, gastritis, cholecystitis, pancreatitis, ACS/stent thrombosis, consider but doubt PE or aortic dissection.  Also consider MSK strain as patient has been coughing recently.  Consider underlying pneumonia contributing.  Plan: - Labs - EKG  - Tylenol , GI cocktail -chest x-ray   Patient's presentation is most consistent with acute presentation with potential threat to life or bodily function.  The patient is on the cardiac monitor to evaluate for evidence of arrhythmia and/or significant heart rate changes.  ED course below.  Patient with persistent symptoms despite GI cocktail.  Initial laboratory workup unremarkable including troponin and hepatobiliary labs.  Given persistent epigastric pain in this elderly female, I did escalate to CT abdomen pelvis and CT PE which showed evidence of possible pneumonia though otherwise no acute pathology.  Patient does endorse recent cough, treating with Augmentin .  Received first dose in the emergency department.  Troponins negative, EKG nonischemic, notable tenderness in abdomen, do not suspect cardiac etiology plan for PMD and cardiology follow-up.  Continue her home antacid.  Rx Maalox as well as Augmentin .  ED return precautions in place.  Patient agrees with plan.  Clinical Course as of 03/13/24 2326  Mon Mar 13, 2024  1829 Troponin normal [MM]  1829 CBC at baseline BMP reviewed, unremarkable [MM]  1829 Chest x-ray interpreted by myself, radiology report reviewed.  No acute pathology identified.  Mild cardiomegaly.  IMPRESSION: Borderline cardiomegaly.  No acute pulmonary process.   [MM]  1830 Ecg = sinus rhythm, rate 64, no gross ST elevation or depression, no significant repolarization abnormality, normal axis, normal intervals.  No clear evidence of ischemia nor arrhythmia my interpretation. [MM]  2004 Hepatobiliary labs normal [MM]  2010 Urinalysis with no clear  evidence of infection [MM]  2011 Repeat troponin stable [MM]  2139 Patient reevaluated, notes she is still having significant epigastric and lower chest discomfort.  Does not want stronger pain medications at this time as she would like to be able to drive home if her workup is reassuring.  Lipase pending.  Will escalate to CT abdomen pelvis and CT PE to visualize upper abdomen and lower chest.  Differential includes dyspepsia/gastritis, biliary colic, pancreatitis, possible postprocedural complication, consider but doubt PE [MM]  2249 CTAP: IMPRESSION: 1. No acute findings in the abdomen or pelvis related to the upper abdominal pain. 2. Infiltration within the right inguinal region anterior to the right common femoral artery and vein, possibly reflecting a small amount  of interstitial hemorrhage following catheterization. No mass-forming hematoma identified.   [MM]  2249 CTPE: IMPRESSION: 1. No evidence of acute or chronic pulmonary embolism. 2. Mild ground glass pulmonary infiltrate in the right upper lobe, possibly infectious or inflammatory in the acute setting. 3. Stable bronchial wall thickening, consistent with airway inflammation. 4. Extensive multivessel coronary artery calcification.   [MM]    Clinical Course User Index [MM] Clarine Ozell LABOR, MD     FINAL CLINICAL IMPRESSION(S) / ED DIAGNOSES   Final diagnoses:  Epigastric pain  Chest pain, unspecified type     Rx / DC Orders   ED Discharge Orders          Ordered    amoxicillin -clavulanate (AUGMENTIN ) 875-125 MG tablet  2 times daily        03/13/24 2322    aluminum-magnesium hydroxide-simethicone  (MAALOX) 200-200-20 MG/5ML SUSP  3 times daily before meals & bedtime        03/13/24 2322             Note:  This document was prepared using Dragon voice recognition software and may include unintentional dictation errors.   Clarine Ozell LABOR, MD 03/13/24 2326

## 2024-03-14 ENCOUNTER — Ambulatory Visit: Admitting: Cardiovascular Disease

## 2024-03-14 ENCOUNTER — Encounter: Payer: Self-pay | Admitting: Cardiovascular Disease

## 2024-03-14 VITALS — BP 124/79 | HR 64 | Ht 64.0 in | Wt 244.0 lb

## 2024-03-14 DIAGNOSIS — R0789 Other chest pain: Secondary | ICD-10-CM

## 2024-03-14 DIAGNOSIS — Z9861 Coronary angioplasty status: Secondary | ICD-10-CM

## 2024-03-14 DIAGNOSIS — E782 Mixed hyperlipidemia: Secondary | ICD-10-CM | POA: Diagnosis not present

## 2024-03-14 DIAGNOSIS — R0602 Shortness of breath: Secondary | ICD-10-CM | POA: Diagnosis not present

## 2024-03-14 DIAGNOSIS — Z0130 Encounter for examination of blood pressure without abnormal findings: Secondary | ICD-10-CM

## 2024-03-14 DIAGNOSIS — K219 Gastro-esophageal reflux disease without esophagitis: Secondary | ICD-10-CM

## 2024-03-14 DIAGNOSIS — J189 Pneumonia, unspecified organism: Secondary | ICD-10-CM

## 2024-03-14 DIAGNOSIS — F17210 Nicotine dependence, cigarettes, uncomplicated: Secondary | ICD-10-CM | POA: Diagnosis not present

## 2024-03-14 DIAGNOSIS — I25118 Atherosclerotic heart disease of native coronary artery with other forms of angina pectoris: Secondary | ICD-10-CM

## 2024-03-14 NOTE — Progress Notes (Signed)
 Cardiology Office Note   Date:  03/14/2024   ID:  Hannah Kaiser, DOB 1960-11-29, MRN [id number]  PCP:  Vicci Duwaine SQUIBB, DO  Cardiologist:  Denyse Bathe, MD      History of Present Illness: Hannah Kaiser is a 63 y.o. female who presents for  Chief Complaint  Patient presents with   Acute Visit    Chest pain    Went to er with chest pain and workup was negative including Ct scan.      Past Medical History:  Diagnosis Date   Anemia    Anxiety    Arthritis    knees, thoracic spurs and arthritis   Asthma    Breast pain    CHF (congestive heart failure) (HCC)    Colon polyps    COPD (chronic obstructive pulmonary disease) (HCC)    Depression    Dysrhythmia    GERD (gastroesophageal reflux disease)    Glaucoma    Heart murmur    History of blood transfusion    Hypertension    Motion sickness    cars   Multilevel degenerative disc disease    Neuropathy    Bilateral arms and legs   Nipple discharge    Numbness of both lower extremities    bulging lumbar discs - per pt   Numbness of upper extremity    bilateral - degenerative cervical discs - per pt   Pneumonia    PONV (postoperative nausea and vomiting)    Sleep apnea    has CPAP   Smokers' cough (HCC)    Swelling of limb 03/11/2021   Wears dentures    full upper and lower     Past Surgical History:  Procedure Laterality Date   ABDOMINAL HYSTERECTOMY     BACK SURGERY     BELPHAROPTOSIS REPAIR     BROW LIFT Bilateral 03/24/2016   Procedure: BLEPHAROPLASTY;  Surgeon: Greig CHRISTELLA Gay, MD;  Location: [facility];  Service: Ophthalmology;  Laterality: Bilateral;  sleep apnea   CARDIAC CATHETERIZATION     COLONOSCOPY WITH PROPOFOL  N/A 10/19/2016   Procedure: COLONOSCOPY WITH PROPOFOL ;  Surgeon: Jinny Carmine, MD;  Location: [facility];  Service: Endoscopy;  Laterality: N/A;   COLONOSCOPY WITH PROPOFOL  N/A 05/12/2022   Procedure: COLONOSCOPY WITH PROPOFOL ;  Surgeon: Jinny Carmine, MD;  Location:  [facility] ENDOSCOPY;  Service: Endoscopy;  Laterality: N/A;   CORONARY STENT INTERVENTION N/A 03/07/2024   Procedure: CORONARY STENT INTERVENTION;  Surgeon: Ammon Blunt, MD;  Location: [facility] INVASIVE CV LAB;  Service: Cardiovascular;  Laterality: N/A;   DILATION AND CURETTAGE OF UTERUS     ECTOPIC PREGNANCY SURGERY     x2   ESOPHAGOGASTRODUODENOSCOPY (EGD) WITH PROPOFOL  N/A 10/19/2016   Procedure: ESOPHAGOGASTRODUODENOSCOPY (EGD) WITH PROPOFOL ;  Surgeon: Jinny Carmine, MD;  Location: [facility];  Service: Endoscopy;  Laterality: N/A;   LEFT HEART CATH AND CORONARY ANGIOGRAPHY Left 12/29/2016   Procedure: Left Heart Cath and Coronary Angiography;  Surgeon: Bathe Denyse LABOR, MD;  Location: [facility] INVASIVE CV LAB;  Service: Cardiovascular;  Laterality: Left;   LEFT HEART CATH AND CORONARY ANGIOGRAPHY Left 03/07/2024   Procedure: LEFT HEART CATH AND CORONARY ANGIOGRAPHY with possible coronary intervention;  Surgeon: Bathe Denyse LABOR, MD;  Location: [facility] INVASIVE CV LAB;  Service: Cardiovascular;  Laterality: Left;   NECK SURGERY  2003   POLYPECTOMY  10/19/2016   Procedure: POLYPECTOMY;  Surgeon: Jinny Carmine, MD;  Location: [facility];  Service: Endoscopy;;  SPINE SURGERY  1989, 1998   TOTAL ABDOMINAL HYSTERECTOMY W/ BILATERAL SALPINGOOPHORECTOMY  2014   TUBAL LIGATION       Current Outpatient Medications  Medication Sig Dispense Refill   albuterol  (VENTOLIN  HFA) 108 (90 Base) MCG/ACT inhaler Inhale 1-2 puffs into the lungs every 6 (six) hours as needed for wheezing or shortness of breath. 18 each 1   aluminum-magnesium hydroxide-simethicone  (MAALOX) 200-200-20 MG/5ML SUSP Take 30 mLs by mouth 4 (four) times daily -  before meals and at bedtime. 1680 mL 0   amLODipine  (NORVASC ) 10 MG tablet Take 1 tablet (10 mg total) by mouth daily. 90 tablet 1   amoxicillin -clavulanate (AUGMENTIN ) 875-125 MG tablet Take 1 tablet by mouth 2 (two) times daily for 7 days. 14 tablet 0   aspirin  EC  81 MG tablet Take 1 tablet (81 mg total) by mouth daily. Swallow whole. 30 tablet 12   atenolol  (TENORMIN ) 50 MG tablet Take 1 tablet (50 mg total) by mouth daily. 90 tablet 1   budesonide -glycopyrrolate -formoterol  (BREZTRI  AEROSPHERE) 160-9-4.8 MCG/ACT AERO inhaler Inhale 2 puffs into the lungs 2 (two) times daily.     busPIRone  (BUSPAR ) 15 MG tablet Take 1 tablet (15 mg total) by mouth 3 (three) times daily. 270 tablet 1   clonazePAM  (KLONOPIN ) 1 MG tablet Take 1 tablet (1 mg total) by mouth 2 (two) times daily as needed. for anxiety 60 tablet 0   Evolocumab  (REPATHA  SURECLICK) 140 MG/ML SOAJ Inject 140 mg into the skin every 14 (fourteen) days. 2 mL 2   ezetimibe  (ZETIA ) 10 MG tablet Take 1 tablet (10 mg total) by mouth daily. 90 tablet 1   FLUoxetine  (PROZAC ) 40 MG capsule Take 2 capsules (80 mg total) by mouth daily. 180 capsule 1   furosemide  (LASIX ) 20 MG tablet TAKE 1 TABLET BY MOUTH ONCE DAILY AS NEEDED FOR SWELLING 60 tablet 3   hydrOXYzine  (ATARAX ) 25 MG tablet Take 1 tablet (25 mg total) by mouth 3 (three) times daily as needed. 270 tablet 1   isosorbide  mononitrate (IMDUR ) 60 MG 24 hr tablet Take 1 tablet (60 mg total) by mouth daily. 90 tablet 1   losartan  (COZAAR ) 100 MG tablet Take 1 tablet (100 mg total) by mouth daily. 90 tablet 1   mupirocin  ointment (BACTROBAN ) 2 % Apply 1 application. topically 2 (two) times daily. (Patient taking differently: Apply 1 application  topically daily as needed (Breakouts).) 22 g 0   nicotine (NICODERM CQ - DOSED IN MG/24 HOURS) 21 mg/24hr patch Place 1 patch (21 mg total) onto the skin daily. 30 patch 1   nystatin -triamcinolone  ointment (MYCOLOG) Apply 1 application topically 2 (two) times daily. (Patient taking differently: Apply 1 application  topically 2 (two) times daily as needed (sores that do not heal).) 30 g 1   ondansetron  (ZOFRAN ) 4 MG tablet Take 1 tablet (4 mg total) by mouth every 8 (eight) hours as needed for nausea or vomiting. 60  tablet 3   pantoprazole  (PROTONIX ) 40 MG tablet Take 1 tablet (40 mg total) by mouth 2 (two) times daily. 180 tablet 1   prasugrel (EFFIENT) 10 MG TABS tablet Take 1 tablet (10 mg total) by mouth daily. 30 tablet 0   QUEtiapine  Fumarate (SEROQUEL  XR) 150 MG 24 hr tablet Take 1 tablet (150 mg total) by mouth at bedtime. (Patient taking differently: Take 150 mg by mouth at bedtime. Taking (3) 50 tablets) 90 tablet 0   ranolazine  (RANEXA ) 500 MG 12 hr tablet TAKE 1 TABLET BY  MOUTH TWICE A DAY 180 tablet 0   semaglutide -weight management (WEGOVY ) 0.25 MG/0.5ML SOAJ SQ injection Inject 0.25 mg into the skin once a week. 2 mL 0   tiZANidine  (ZANAFLEX ) 4 MG tablet Take 1 tablet (4 mg total) by mouth every 8 (eight) hours as needed for muscle spasms. 90 tablet 1   triamcinolone  ointment (KENALOG ) 0.5 % Apply 1 application topically 2 (two) times daily. (Patient taking differently: Apply 1 application  topically daily as needed (Breakout).) 30 g 0   valACYclovir  (VALTREX ) 1000 MG tablet Take 1 tablet (1,000 mg total) by mouth daily. (Patient taking differently: Take 1,000 mg by mouth daily as needed (Breakout).) 180 tablet 1   Vitamin D , Ergocalciferol , (DRISDOL ) 1.25 MG (50000 UNIT) CAPS capsule TAKE 1 CAPSULE BY MOUTH ONE TIME PER WEEK 12 capsule 0   No current facility-administered medications for this visit.    Allergies:   Aripiprazole , Bupropion, Ace inhibitors, Paxlovid [nirmatrelvir-ritonavir], Statins, Azithromycin, and Erythromycin    Social History:   reports that she has been smoking cigarettes. She has a 61.5 pack-year smoking history. She has never used smokeless tobacco. She reports that she does not drink alcohol and does not use drugs.   Family History:  family history includes Cancer in her brother, maternal grandfather, and maternal uncle.    ROS:     Review of Systems  Constitutional: Negative.   HENT: Negative.    Eyes: Negative.   Respiratory: Negative.    Gastrointestinal:  Negative.   Genitourinary: Negative.   Musculoskeletal: Negative.   Skin: Negative.   Neurological: Negative.   Endo/Heme/Allergies: Negative.   Psychiatric/Behavioral: Negative.    All other systems reviewed and are negative.     All other systems are reviewed and negative.    PHYSICAL EXAM: VS:  BP 124/79   Pulse 64   Ht 5' 4 (1.626 m)   Wt 244 lb (110.7 kg)   SpO2 96%   BMI 41.88 kg/m  , BMI Body mass index is 41.88 kg/m. Last weight:  Wt Readings from Last 3 Encounters:  03/14/24 244 lb (110.7 kg)  03/13/24 259 lb (117.5 kg)  03/09/24 244 lb 6.4 oz (110.9 kg)     Physical Exam Constitutional:      Appearance: Normal appearance.  Cardiovascular:     Rate and Rhythm: Normal rate and regular rhythm.     Heart sounds: Normal heart sounds.  Pulmonary:     Effort: Pulmonary effort is normal.     Breath sounds: Normal breath sounds.  Musculoskeletal:     Right lower leg: No edema.     Left lower leg: No edema.  Neurological:     Mental Status: She is alert.       EKG:   Recent Labs: 12/27/2023: TSH 1.780 03/13/2024: ALT 11; BUN 17; Creatinine, Ser 0.78; Hemoglobin 11.8; Platelets 260; Potassium 4.1; Sodium 139    Lipid Panel    Component Value Date/Time   CHOL 227 (H) 10/19/2023 0926   TRIG 135 10/19/2023 0926   HDL 53 10/19/2023 0926   CHOLHDL 4.7 (H) 08/30/2020 0904   LDLCALC 150 (H) 10/19/2023 9073      Other studies Reviewed: Additional studies/ records that were reviewed today include:  Review of the above records demonstrates:       No data to display            ASSESSMENT AND PLAN:    ICD-10-CM   1. Nicotine dependence, cigarettes, uncomplicated  F17.210  2. Other chest pain  R07.89     3. SOB (shortness of breath)  R06.02     4. Coronary artery disease of native artery of native heart with stable angina pectoris  I25.118     5. Status post percutaneous coronary intervention (PCI)  Z98.61     6. Gastroesophageal reflux  disease, unspecified whether esophagitis present  K21.9     7. Mixed hyperlipidemia  E78.2     8. Pneumonia due to infectious organism, unspecified laterality, unspecified part of lung  J18.9    Seen in [facility], EKG and troponins normal, as has cough and constant chest pains. Advise auhmentin.       Problem List Items Addressed This Visit       Cardiovascular and Mediastinum   CAD (coronary artery disease)     Digestive   GERD (gastroesophageal reflux disease)     Other   Hyperlipidemia   Nicotine dependence, cigarettes, uncomplicated - Primary   Other chest pain   SOB (shortness of breath)   Other Visit Diagnoses       Status post percutaneous coronary intervention (PCI)         Pneumonia due to infectious organism, unspecified laterality, unspecified part of lung       Seen in [facility], EKG and troponins normal, as has cough and constant chest pains. Advise auhmentin.          Disposition:   Return in about 3 days (around 03/17/2024) for f/u friday.    Total time spent: 40 minutes  Signed,  Denyse Bathe, MD  03/14/2024 9:26 AM    [facility]

## 2024-03-16 ENCOUNTER — Ambulatory Visit: Admitting: Cardiovascular Disease

## 2024-03-20 ENCOUNTER — Ambulatory Visit: Admitting: Cardiovascular Disease

## 2024-03-20 ENCOUNTER — Ambulatory Visit (INDEPENDENT_AMBULATORY_CARE_PROVIDER_SITE_OTHER): Admitting: Sleep Medicine

## 2024-03-20 ENCOUNTER — Encounter: Payer: Self-pay | Admitting: Cardiovascular Disease

## 2024-03-20 ENCOUNTER — Encounter: Payer: Self-pay | Admitting: Sleep Medicine

## 2024-03-20 VITALS — BP 124/68 | HR 65 | Ht 64.0 in | Wt 244.0 lb

## 2024-03-20 VITALS — BP 130/80 | HR 60 | Temp 98.7°F | Ht 64.0 in | Wt 243.6 lb

## 2024-03-20 DIAGNOSIS — G4733 Obstructive sleep apnea (adult) (pediatric): Secondary | ICD-10-CM | POA: Diagnosis not present

## 2024-03-20 DIAGNOSIS — Z6841 Body Mass Index (BMI) 40.0 and over, adult: Secondary | ICD-10-CM | POA: Diagnosis not present

## 2024-03-20 DIAGNOSIS — I10 Essential (primary) hypertension: Secondary | ICD-10-CM | POA: Diagnosis not present

## 2024-03-20 DIAGNOSIS — F17210 Nicotine dependence, cigarettes, uncomplicated: Secondary | ICD-10-CM

## 2024-03-20 DIAGNOSIS — I2510 Atherosclerotic heart disease of native coronary artery without angina pectoris: Secondary | ICD-10-CM

## 2024-03-20 DIAGNOSIS — I5033 Acute on chronic diastolic (congestive) heart failure: Secondary | ICD-10-CM

## 2024-03-20 DIAGNOSIS — R0602 Shortness of breath: Secondary | ICD-10-CM

## 2024-03-20 DIAGNOSIS — R0789 Other chest pain: Secondary | ICD-10-CM | POA: Diagnosis not present

## 2024-03-20 NOTE — Progress Notes (Signed)
 Cardiology Office Note   Date:  03/20/2024   ID:  Hannah Kaiser, DOB [birth year], MRN [id number]  PCP:  Vicci Duwaine SQUIBB, DO  Cardiologist:  Denyse Bathe, MD      History of Present Illness: Hannah Kaiser is a 63 y.o. female who presents for  Chief Complaint  Patient presents with   Follow-up    3 day follow up    Still has chest pains.      Past Medical History:  Diagnosis Date   Anemia    Anxiety    Arthritis    knees, thoracic spurs and arthritis   Asthma    Breast pain    CHF (congestive heart failure) (HCC)    Colon polyps    COPD (chronic obstructive pulmonary disease) (HCC)    Depression    Dysrhythmia    GERD (gastroesophageal reflux disease)    Glaucoma    Heart murmur    History of blood transfusion    Hypertension    Motion sickness    cars   Multilevel degenerative disc disease    Neuropathy    Bilateral arms and legs   Nipple discharge    Numbness of both lower extremities    bulging lumbar discs - per pt   Numbness of upper extremity    bilateral - degenerative cervical discs - per pt   Pneumonia    PONV (postoperative nausea and vomiting)    Sleep apnea    has CPAP   Smokers' cough (HCC)    Swelling of limb 03/11/2021   Wears dentures    full upper and lower     Past Surgical History:  Procedure Laterality Date   ABDOMINAL HYSTERECTOMY     BACK SURGERY     BELPHAROPTOSIS REPAIR     BROW LIFT Bilateral 03/24/2016   Procedure: BLEPHAROPLASTY;  Surgeon: Greig CHRISTELLA Gay, MD;  Location: [facility];  Service: Ophthalmology;  Laterality: Bilateral;  sleep apnea   CARDIAC CATHETERIZATION     COLONOSCOPY WITH PROPOFOL  N/A 10/19/2016   Procedure: COLONOSCOPY WITH PROPOFOL ;  Surgeon: Jinny Carmine, MD;  Location: [facility];  Service: Endoscopy;  Laterality: N/A;   COLONOSCOPY WITH PROPOFOL  N/A 05/12/2022   Procedure: COLONOSCOPY WITH PROPOFOL ;  Surgeon: Jinny Carmine, MD;  Location: [facility];  Service: Endoscopy;   Laterality: N/A;   CORONARY STENT INTERVENTION N/A 03/07/2024   Procedure: CORONARY STENT INTERVENTION;  Surgeon: Ammon Blunt, MD;  Location: [facility] IN[facility];  Service: Cardiovascular;  Laterality: N/A;   DILATION AND CURETTAGE OF UTERUS     ECTOPIC PREGNANCY SURGERY     x2   ESOPHAGOGASTRODUODENOSCOPY (EGD) WITH PROPOFOL  N/A 10/19/2016   Procedure: ESOPHAGOGASTRODUODENOSCOPY (EGD) WITH PROPOFOL ;  Surgeon: Jinny Carmine, MD;  Location: [facility];  Service: Endoscopy;  Laterality: N/A;   LEFT HEART CATH AND CORONARY ANGIOGRAPHY Left 12/29/2016   Procedure: Left Heart Cath and Coronary Angiography;  Surgeon: Bathe Denyse LABOR, MD;  Location: [facility];  Service: Cardiovascular;  Laterality: Left;   LEFT HEART CATH AND CORONARY ANGIOGRAPHY Left 03/07/2024   Procedure: LEFT HEART CATH AND CORONARY ANGIOGRAPHY with possible coronary intervention;  Surgeon: Bathe Denyse LABOR, MD;  Location: [facility];  Service: Cardiovascular;  Laterality: Left;   NECK SURGERY  2003   POLYPECTOMY  10/19/2016   Procedure: POLYPECTOMY;  Surgeon: Jinny Carmine, MD;  Location: [facility];  Service: Endoscopy;;   SPINE SURGERY  1989, 1998   TOTAL  ABDOMINAL HYSTERECTOMY W/ BILATERAL SALPINGOOPHORECTOMY  2014   TUBAL LIGATION       Current Outpatient Medications  Medication Sig Dispense Refill   albuterol  (VENTOLIN  HFA) 108 (90 Base) MCG/ACT inhaler Inhale 1-2 puffs into the lungs every 6 (six) hours as needed for wheezing or shortness of breath. 18 each 1   aluminum-magnesium hydroxide-simethicone  (MAALOX) 200-200-20 MG/5ML SUSP Take 30 mLs by mouth 4 (four) times daily -  before meals and at bedtime. 1680 mL 0   amLODipine  (NORVASC ) 10 MG tablet Take 1 tablet (10 mg total) by mouth daily. 90 tablet 1   amoxicillin -clavulanate (AUGMENTIN ) 875-125 MG tablet Take 1 tablet by mouth 2 (two) times daily for 7 days. 14 tablet 0   aspirin  EC 81 MG tablet Take 1 tablet (81 mg  total) by mouth daily. Swallow whole. 30 tablet 12   atenolol  (TENORMIN ) 50 MG tablet Take 1 tablet (50 mg total) by mouth daily. 90 tablet 1   budesonide -glycopyrrolate -formoterol  (BREZTRI  AEROSPHERE) 160-9-4.8 MCG/ACT AERO inhaler Inhale 2 puffs into the lungs 2 (two) times daily.     busPIRone  (BUSPAR ) 15 MG tablet Take 1 tablet (15 mg total) by mouth 3 (three) times daily. 270 tablet 1   clonazePAM  (KLONOPIN ) 1 MG tablet Take 1 tablet (1 mg total) by mouth 2 (two) times daily as needed. for anxiety 60 tablet 0   Evolocumab  (REPATHA  SURECLICK) 140 MG/ML SOAJ Inject 140 mg into the skin every 14 (fourteen) days. 2 mL 2   ezetimibe  (ZETIA ) 10 MG tablet Take 1 tablet (10 mg total) by mouth daily. 90 tablet 1   FLUoxetine  (PROZAC ) 40 MG capsule Take 2 capsules (80 mg total) by mouth daily. 180 capsule 1   furosemide  (LASIX ) 20 MG tablet TAKE 1 TABLET BY MOUTH ONCE DAILY AS NEEDED FOR SWELLING 60 tablet 3   hydrOXYzine  (ATARAX ) 25 MG tablet Take 1 tablet (25 mg total) by mouth 3 (three) times daily as needed. 270 tablet 1   isosorbide  mononitrate (IMDUR ) 60 MG 24 hr tablet Take 1 tablet (60 mg total) by mouth daily. 90 tablet 1   losartan  (COZAAR ) 100 MG tablet Take 1 tablet (100 mg total) by mouth daily. 90 tablet 1   mupirocin  ointment (BACTROBAN ) 2 % Apply 1 application. topically 2 (two) times daily. (Patient taking differently: Apply 1 application  topically daily as needed (Breakouts).) 22 g 0   nicotine (NICODERM CQ - DOSED IN MG/24 HOURS) 21 mg/24hr patch Place 1 patch (21 mg total) onto the skin daily. 30 patch 1   nystatin -triamcinolone  ointment (MYCOLOG) Apply 1 application topically 2 (two) times daily. (Patient taking differently: Apply 1 application  topically 2 (two) times daily as needed (sores that do not heal).) 30 g 1   ondansetron  (ZOFRAN ) 4 MG tablet Take 1 tablet (4 mg total) by mouth every 8 (eight) hours as needed for nausea or vomiting. 60 tablet 3   pantoprazole  (PROTONIX ) 40  MG tablet Take 1 tablet (40 mg total) by mouth 2 (two) times daily. 180 tablet 1   prasugrel (EFFIENT) 10 MG TABS tablet Take 1 tablet (10 mg total) by mouth daily. 30 tablet 0   QUEtiapine  Fumarate (SEROQUEL  XR) 150 MG 24 hr tablet Take 1 tablet (150 mg total) by mouth at bedtime. (Patient taking differently: Take 150 mg by mouth at bedtime. Taking (3) 50 tablets) 90 tablet 0   ranolazine  (RANEXA ) 500 MG 12 hr tablet TAKE 1 TABLET BY MOUTH TWICE A DAY 180 tablet 0  semaglutide -weight management (WEGOVY ) 0.25 MG/0.5ML SOAJ SQ injection Inject 0.25 mg into the skin once a week. 2 mL 0   tiZANidine  (ZANAFLEX ) 4 MG tablet Take 1 tablet (4 mg total) by mouth every 8 (eight) hours as needed for muscle spasms. 90 tablet 1   triamcinolone  ointment (KENALOG ) 0.5 % Apply 1 application topically 2 (two) times daily. (Patient taking differently: Apply 1 application  topically daily as needed (Breakout).) 30 g 0   valACYclovir  (VALTREX ) 1000 MG tablet Take 1 tablet (1,000 mg total) by mouth daily. (Patient taking differently: Take 1,000 mg by mouth daily as needed (Breakout).) 180 tablet 1   Vitamin D , Ergocalciferol , (DRISDOL ) 1.25 MG (50000 UNIT) CAPS capsule TAKE 1 CAPSULE BY MOUTH ONE TIME PER WEEK 12 capsule 0   No current facility-administered medications for this visit.    Allergies:   Aripiprazole , Bupropion, Ace inhibitors, Paxlovid [nirmatrelvir-ritonavir], Statins, Azithromycin, and Erythromycin    Social History:   reports that she has been smoking cigarettes. She has a 61.5 pack-year smoking history. She has never used smokeless tobacco. She reports that she does not drink alcohol and does not use drugs.   Family History:  family history includes Cancer in her brother, maternal grandfather, and maternal uncle.    ROS:     Review of Systems  Constitutional: Negative.   HENT: Negative.    Eyes: Negative.   Respiratory: Negative.    Gastrointestinal: Negative.   Genitourinary: Negative.    Musculoskeletal: Negative.   Skin: Negative.   Neurological: Negative.   Endo/Heme/Allergies: Negative.   Psychiatric/Behavioral: Negative.    All other systems reviewed and are negative.     All other systems are reviewed and negative.    PHYSICAL EXAM: VS:  BP 124/68   Pulse 65   Ht 5' 4 (1.626 m)   Wt 244 lb (110.7 kg)   SpO2 96%   BMI 41.88 kg/m  , BMI Body mass index is 41.88 kg/m. Last weight:  Wt Readings from Last 3 Encounters:  03/20/24 244 lb (110.7 kg)  03/14/24 244 lb (110.7 kg)  03/13/24 259 lb (117.5 kg)     Physical Exam Constitutional:      Appearance: Normal appearance.  Cardiovascular:     Rate and Rhythm: Normal rate and regular rhythm.     Heart sounds: Normal heart sounds.  Pulmonary:     Effort: Pulmonary effort is normal.     Breath sounds: Normal breath sounds.  Musculoskeletal:     Right lower leg: No edema.     Left lower leg: No edema.  Neurological:     Mental Status: She is alert.       EKG:   Recent Labs: 12/27/2023: TSH 1.780 03/13/2024: ALT 11; BUN 17; Creatinine, Ser 0.78; Hemoglobin 11.8; Platelets 260; Potassium 4.1; Sodium 139    Lipid Panel    Component Value Date/Time   CHOL 227 (H) 10/19/2023 0926   TRIG 135 10/19/2023 0926   HDL 53 10/19/2023 0926   CHOLHDL 4.7 (H) 08/30/2020 0904   LDLCALC 150 (H) 10/19/2023 9073      Other studies Reviewed: Additional studies/ records that were reviewed today include:  Review of the above records demonstrates:       No data to display            ASSESSMENT AND PLAN:    ICD-10-CM   1. Nicotine dependence, cigarettes, uncomplicated  F17.210     2. Other chest pain  R07.89  3. SOB (shortness of breath)  R06.02     4. CHF (congestive heart failure), NYHA class III, acute on chronic, diastolic (HCC)  I50.33     5. CAD S/P percutaneous coronary angioplasty  I25.10    Z98.61    Doing well, atypical chest pain and SOB due to fact continue to smoke. Had PCI  of LAD.       Problem List Items Addressed This Visit       Cardiovascular and Mediastinum   CHF (congestive heart failure), NYHA class III, acute on chronic, diastolic (HCC)     Other   Nicotine dependence, cigarettes, uncomplicated - Primary   Other chest pain   SOB (shortness of breath)   Other Visit Diagnoses       CAD S/P percutaneous coronary angioplasty       Doing well, atypical chest pain and SOB due to fact continue to smoke. Had PCI of LAD.          Disposition:   Return in about 6 weeks (around 05/01/2024).    Total time spent: 30 minutes  Signed,  Denyse Bathe, MD  03/20/2024 9:50 AM    [facility]

## 2024-03-20 NOTE — Progress Notes (Signed)
 Name:Hannah Kaiser MRN: [id number] DOB: [birth year]   CHIEF COMPLAINT:  REASSESSMENT OF OSA   HISTORY OF PRESENT ILLNESS: Hannah Kaiser is a 63 y.o. w/ a h/o OSA, HTN, CAD, CHF, hypothyroidism, COPD and morbid obesity who presents for reassessment of OSA. Reports that she was diagnosed with severe OSA in 2017 and was subsequently started on CPAP therapy. Reports using CPAP therapy for a brief period and discontinued use due to mask and pressure discomfort. Reports c/o loud snoring, witnessed apnea and excessive daytime sleepiness which has been present for several years. Reports nocturnal awakenings due to unclear reasons and has difficulty falling back to sleep. Reports significant weight changes. Admits to night sweats and dry mouth. Denies morning headaches, RLS symptoms, dream enactment, cataplexy, hypnagogic or hypnapompic hallucinations. Reports a family history of sleep apnea. Reports occasional drowsy driving. Drinks 1 cup of coffee and 1 cup of tea daily, denies alcohol or illicit drug use. Smokes 1 PPD tobacco use.   Bedtime 8 pm Sleep onset 30 mins Rise time 7 am   EPWORTH SLEEP SCORE 16    03/20/2024    1:00 PM  Results of the Epworth flowsheet  Sitting and reading 3  Watching TV 3  Sitting, inactive in a public place (e.g. a theatre or a meeting) 2  As a passenger in a car for an hour without a break 3  Lying down to rest in the afternoon when circumstances permit 3  Sitting and talking to someone 0  Sitting quietly after a lunch without alcohol 2  In a car, while stopped for a few minutes in traffic 0  Total score 16    PAST MEDICAL HISTORY :   has a past medical history of Anemia, Anxiety, Arthritis, Asthma, Breast pain, CHF (congestive heart failure) (HCC), Colon polyps, COPD (chronic obstructive pulmonary disease) (HCC), Depression, Dysrhythmia, GERD (gastroesophageal reflux disease), Glaucoma, Heart murmur, History of blood transfusion, Hypertension, Motion  sickness, Multilevel degenerative disc disease, Neuropathy, Nipple discharge, Numbness of both lower extremities, Numbness of upper extremity, Pneumonia, PONV (postoperative nausea and vomiting), Sleep apnea, Smokers' cough (HCC), Swelling of limb (03/11/2021), and Wears dentures.  has a past surgical history that includes Total abdominal hysterectomy w/ bilateral salpingoophorectomy (2014); Spine surgery (1989, 1998); Neck surgery (2003); Ectopic pregnancy surgery; Brow lift (Bilateral, 03/24/2016); Esophagogastroduodenoscopy (egd) with propofol  (N/A, 10/19/2016); Colonoscopy with propofol  (N/A, 10/19/2016); polypectomy (10/19/2016); Back surgery; Blepharoptosis repair; Dilation and curettage of uterus; Tubal ligation; LEFT HEART CATH AND CORONARY ANGIOGRAPHY (Left, 12/29/2016); Cardiac catheterization; Abdominal hysterectomy; Colonoscopy with propofol  (N/A, 05/12/2022); LEFT HEART CATH AND CORONARY ANGIOGRAPHY (Left, 03/07/2024); and CORONARY STENT INTERVENTION (N/A, 03/07/2024). Prior to Admission medications   Medication Sig Start Date End Date Taking? Authorizing Provider  albuterol  (VENTOLIN  HFA) 108 (90 Base) MCG/ACT inhaler Inhale 1-2 puffs into the lungs every 6 (six) hours as needed for wheezing or shortness of breath. 10/19/23  Yes Johnson, Megan P, DO  aluminum-magnesium hydroxide-simethicone  (MAALOX) 200-200-20 MG/5ML SUSP Take 30 mLs by mouth 4 (four) times daily -  before meals and at bedtime. 03/13/24  Yes Clarine Ozell LABOR, MD  amLODipine  (NORVASC ) 10 MG tablet Take 1 tablet (10 mg total) by mouth daily. 10/19/23  Yes Johnson, Megan P, DO  amoxicillin -clavulanate (AUGMENTIN ) 875-125 MG tablet Take 1 tablet by mouth 2 (two) times daily for 7 days. 03/14/24 03/21/24 Yes Clarine Ozell LABOR, MD  aspirin  EC 81 MG tablet Take 1 tablet (81 mg total) by mouth daily. Swallow whole. 03/09/24  Yes Fernand Denyse LABOR, MD  atenolol  (TENORMIN ) 50 MG tablet Take 1 tablet (50 mg total) by mouth daily. 10/19/23  Yes  Johnson, Megan P, DO  budesonide -glycopyrrolate -formoterol  (BREZTRI  AEROSPHERE) 160-9-4.8 MCG/ACT AERO inhaler Inhale 2 puffs into the lungs 2 (two) times daily.   Yes [provider]  busPIRone  (BUSPAR ) 15 MG tablet Take 1 tablet (15 mg total) by mouth 3 (three) times daily. 10/19/23  Yes Johnson, Megan P, DO  clonazePAM  (KLONOPIN ) 1 MG tablet Take 1 tablet (1 mg total) by mouth 2 (two) times daily as needed. for anxiety 10/19/23  Yes Johnson, Megan P, DO  Evolocumab  (REPATHA  SURECLICK) 140 MG/ML SOAJ Inject 140 mg into the skin every 14 (fourteen) days. 01/27/24  Yes Johnson, Megan P, DO  ezetimibe  (ZETIA ) 10 MG tablet Take 1 tablet (10 mg total) by mouth daily. 01/27/24  Yes Johnson, Megan P, DO  FLUoxetine  (PROZAC ) 40 MG capsule Take 2 capsules (80 mg total) by mouth daily. 10/19/23  Yes Johnson, Megan P, DO  furosemide  (LASIX ) 20 MG tablet TAKE 1 TABLET BY MOUTH ONCE DAILY AS NEEDED FOR SWELLING 03/09/24 03/09/25 Yes Fernand Denyse LABOR, MD  hydrOXYzine  (ATARAX ) 25 MG tablet Take 1 tablet (25 mg total) by mouth 3 (three) times daily as needed. 12/27/23  Yes Johnson, Megan P, DO  isosorbide  mononitrate (IMDUR ) 60 MG 24 hr tablet Take 1 tablet (60 mg total) by mouth daily. 10/19/23  Yes Johnson, Megan P, DO  losartan  (COZAAR ) 100 MG tablet Take 1 tablet (100 mg total) by mouth daily. 10/19/23  Yes Johnson, Megan P, DO  mupirocin  ointment (BACTROBAN ) 2 % Apply 1 application. topically 2 (two) times daily. Patient taking differently: Apply 1 application  topically daily as needed (Breakouts). 08/22/21  Yes Vigg, Avanti, MD  nicotine (NICODERM CQ - DOSED IN MG/24 HOURS) 21 mg/24hr patch Place 1 patch (21 mg total) onto the skin daily. 03/09/24 03/09/25 Yes Fernand Denyse LABOR, MD  nystatin -triamcinolone  ointment (MYCOLOG) Apply 1 application topically 2 (two) times daily. Patient taking differently: Apply 1 application  topically 2 (two) times daily as needed (sores that do not heal). 08/30/20  Yes Melvin Pao, NP  ondansetron  (ZOFRAN ) 4 MG tablet Take 1 tablet (4 mg total) by mouth every 8 (eight) hours as needed for nausea or vomiting. 10/19/23  Yes Johnson, Megan P, DO  pantoprazole  (PROTONIX ) 40 MG tablet Take 1 tablet (40 mg total) by mouth 2 (two) times daily. 10/19/23  Yes Johnson, Megan P, DO  prasugrel (EFFIENT) 10 MG TABS tablet Take 1 tablet (10 mg total) by mouth daily. 03/09/24  Yes Fernand Denyse LABOR, MD  QUEtiapine  Fumarate (SEROQUEL  XR) 150 MG 24 hr tablet Take 1 tablet (150 mg total) by mouth at bedtime. Patient taking differently: Take 150 mg by mouth at bedtime. Taking (3) 50 tablets 12/27/23  Yes Johnson, Megan P, DO  ranolazine  (RANEXA ) 500 MG 12 hr tablet TAKE 1 TABLET BY MOUTH TWICE A DAY 10/20/23  Yes Scoggins, Amber, NP  semaglutide -weight management (WEGOVY ) 0.25 MG/0.5ML SOAJ SQ injection Inject 0.25 mg into the skin once a week. 01/27/24  Yes Johnson, Megan P, DO  tiZANidine  (ZANAFLEX ) 4 MG tablet Take 1 tablet (4 mg total) by mouth every 8 (eight) hours as needed for muscle spasms. 10/19/23  Yes Johnson, Megan P, DO  triamcinolone  ointment (KENALOG ) 0.5 % Apply 1 application topically 2 (two) times daily. Patient taking differently: Apply 1 application  topically daily as needed (Breakout). 08/30/20  Yes Melvin Pao, NP  valACYclovir  (VALTREX ) 1000 MG tablet Take 1 tablet (1,000 mg total) by mouth daily. Patient taking differently: Take 1,000 mg by mouth daily as needed (Breakout). 10/19/23  Yes Johnson, Megan P, DO  Vitamin D , Ergocalciferol , (DRISDOL ) 1.25 MG (50000 UNIT) CAPS capsule TAKE 1 CAPSULE BY MOUTH ONE TIME PER WEEK 02/04/24  Yes Johnson, Megan P, DO   Allergies  Allergen Reactions   Aripiprazole  Swelling    Whole  body swelling, retains fluid     Bupropion Other (See Comments)    Worsened depression    Ace Inhibitors Cough    So bad she vomited Product containing angiotensin-converting enzyme inhibitor (product)   Paxlovid [Nirmatrelvir-Ritonavir] Other  (See Comments)    Ulcers in mouth   Statins Other (See Comments)    myalgia   Azithromycin Rash and Dermatitis   Erythromycin Rash    FAMILY HISTORY:  family history includes Cancer in her brother, maternal grandfather, and maternal uncle. SOCIAL HISTORY:  reports that she has been smoking cigarettes. She has a 61.5 pack-year smoking history. She has never used smokeless tobacco. She reports that she does not drink alcohol and does not use drugs.   Review of Systems:  Gen:  Denies  fever, sweats, chills weight loss  HEENT: Denies blurred vision, double vision, ear pain, eye pain, hearing loss, nose bleeds, sore throat Cardiac:  No dizziness, chest pain or heaviness, chest tightness,edema, No JVD Resp:   No cough, -sputum production, -shortness of breath,-wheezing, -hemoptysis,  Gi: Denies swallowing difficulty, stomach pain, nausea or vomiting, diarrhea, constipation, bowel incontinence Gu:  Denies bladder incontinence, burning urine Ext:   Denies Joint pain, stiffness or swelling Skin: Denies  skin rash, easy bruising or bleeding or hives Endoc:  Denies polyuria, polydipsia , polyphagia or weight change Psych:   Denies depression, insomnia or hallucinations  Other:  All other systems negative  VITAL SIGNS: BP 130/80   Pulse 60   Temp 98.7 F (37.1 C)   Ht 5' 4 (1.626 m)   Wt 243 lb 9.6 oz (110.5 kg)   SpO2 97%   BMI 41.81 kg/m    Physical Examination:   General Appearance: No distress  EYES PERRLA, EOM intact.   NECK Supple, No JVD Pulmonary: normal breath sounds, No wheezing.  CardiovascularNormal S1,S2.  No m/r/g.   Abdomen: Benign, Soft, non-tender. Skin:   warm, no rashes, no ecchymosis  Extremities: normal, no cyanosis, clubbing. Neuro:without focal findings,  speech normal  PSYCHIATRIC: Mood, affect within normal limits.   ASSESSMENT AND PLAN  OSA Will complete in lab CPAP/BIPAP titration study. Discussed the consequences of untreated sleep apnea.  Advised not to drive drowsy for safety of patient and others. Will complete further evaluation with a titration study and follow up to review results.    HTN Stable, on current management. Following with PCP.   Morbid obesity Counseled patient on diet and lifestyle modification.    MEDICATION ADJUSTMENTS/LABS AND TESTS ORDERED: Recommend Sleep Study   Patient  satisfied with Plan of action and management. All questions answered  Follow up to review CPAP titration study results and treatment plan.   I spent a total of 46 minutes reviewing chart data, face-to-face evaluation with the patient, counseling and coordination of care as detailed above.    Anson Peddie, M.D.  Sleep Medicine [facility]

## 2024-03-27 ENCOUNTER — Other Ambulatory Visit: Payer: Self-pay

## 2024-03-27 NOTE — Progress Notes (Signed)
 03/27/2024 Name: Hannah Kaiser MRN: [id number] DOB: March 11, 1961  Hannah Kaiser is a 63 y.o. year old female who presented for a telephone visit.   They were referred to the pharmacist by their PCP for assistance in managing medication access.   Subjective:  Care Team: Primary Care Provider: Vicci Duwaine SQUIBB, DO ; Next Scheduled Visit: 11/4  Medication Access/Adherence  Current Pharmacy:  [facility] [phone] [address], [place] - [address] [place] [address] Phone: [phone] Fax: [phone]  -Patient reports affordability concerns with their medications: Yes  -Patient reports access/transportation concerns to their pharmacy: No  -Patient reports adherence concerns with their medications:  Yes    Hyperlipidemia/ASCVD Risk Reduction Current lipid lowering medications: ezetimibe  10mg  daily, Repatha  140mg  every 14 days Medications tried in the past: atorvastatin  and rosuvastatin  cause myalgias Antiplatelet regimen: ASA 81mg  daily, prasugrel 10mg  daily -Repatha  approved by Medicaid and patient initiated therapy just over 1 month ago- endorses tolerating medication well  Obesity/Overweight Current medications: recently prescribed Wegovy  based on obesity, OSA, and ASCVD -PA was approved for this medication and patient initiated therapy around 9/8, but starting 10/1, Medicaid will no longer cover Wegovy  or Zepbound  for weight loss alone.  Patient has not been able to refill Wegovy  this month. -Baseline weight 243lbs, BMI 41.81kg/m2 -Patient is also diagnosed with CHF and CAD with a stent placement just last month  COPD: Current medications: albuterol  rescue inhaler, Breztri  2 puffs BID Medications tried in the past: Symbicort  and Advair -Breztri  approved by Medicaid and therapy was initiated just over 1 month ago.  Patient is tolerating medication well and endorses needing to use albuterol  rescue inhaler less now with this on board -No COPD exacerbations noted  in the past year -Patient has been prescribed Nicotine 21mg  patches for daily use to help with smoking cessation, but she did not pick these up; because insurance was not paying.  Objective:  Lab Results  Component Value Date   CREATININE 0.78 03/13/2024   BUN 17 03/13/2024   NA 139 03/13/2024   K 4.1 03/13/2024   CL 104 03/13/2024   CO2 24 03/13/2024   Lab Results  Component Value Date   CHOL 227 (H) 10/19/2023   HDL 53 10/19/2023   LDLCALC 150 (H) 10/19/2023   TRIG 135 10/19/2023   CHOLHDL 4.7 (H) 08/30/2020   Assessment/Plan:   Hyperlipidemia/ASCVD Risk Reduction: -Currently uncontrolled.  -Continue current regimen at this time -It is recommended to check fasting lipids 4-12 weeks after starting Repatha ; so this could be done at upcoming PCP visit 11/4  Obesity/Overweight: -Currently unable to achieve goal weight loss of 5-10% through diet and lifestyle modifications alone -No CI's identified in regard to continuation of Wegovy  -Based on ASCVD risk reduction in patient with established CHF and CAD with a BMI of 41.81, I recommend resuming Wegovy  at 0.25mg  weekly and increasing dose every 4 weeks as tolerated -Coordinating with the PA team to work on an initial PA based on ASCVD in patient with obesity -Continue to focus on increasing fresh fruits and vegeables as well as water .  Decrease foods and beverages high in sugar, and decrease simple carbohydrates.  Increase lean proteins and complex carbohydrates. -Increase physical activity as able (limited based on COPD)  COPD: -Currently controlled -Continue current regimen at this time -Test claim reflects $0 copay for nicotine 21mg  patches; so [facility] to see if this cannot be filled at no cost for patient  Follow Up  Plan: will follow-up with patient re:  Wegovy  and Nicotine patches by EOW and scheduled 4 week telephone follow-up  Channing DELENA Mealing, PharmD, DPLA

## 2024-03-30 ENCOUNTER — Telehealth: Payer: Self-pay

## 2024-03-30 ENCOUNTER — Other Ambulatory Visit (HOSPITAL_COMMUNITY): Payer: Self-pay

## 2024-03-30 DIAGNOSIS — I25118 Atherosclerotic heart disease of native coronary artery with other forms of angina pectoris: Secondary | ICD-10-CM

## 2024-03-30 DIAGNOSIS — R0602 Shortness of breath: Secondary | ICD-10-CM

## 2024-03-30 DIAGNOSIS — R0789 Other chest pain: Secondary | ICD-10-CM

## 2024-03-30 DIAGNOSIS — E782 Mixed hyperlipidemia: Secondary | ICD-10-CM

## 2024-03-30 DIAGNOSIS — Z9861 Coronary angioplasty status: Secondary | ICD-10-CM

## 2024-03-30 DIAGNOSIS — F17210 Nicotine dependence, cigarettes, uncomplicated: Secondary | ICD-10-CM

## 2024-03-30 DIAGNOSIS — I200 Unstable angina: Secondary | ICD-10-CM

## 2024-03-30 DIAGNOSIS — K219 Gastro-esophageal reflux disease without esophagitis: Secondary | ICD-10-CM

## 2024-03-30 NOTE — Progress Notes (Signed)
 PA has been submitted with PCP and cardiology notes.

## 2024-03-30 NOTE — Telephone Encounter (Signed)
 Pharmacy Patient Advocate Encounter   Received notification from Physician's Office that prior authorization for Wegovy  0.25MG /0.5ML auto-injectors is required/requested.   Insurance verification completed.   The patient is insured through [employer].   Per test claim: PA required; PA submitted to above mentioned insurance via Latent Key/confirmation #/EOC [facility] Status is pending

## 2024-03-30 NOTE — Progress Notes (Unsigned)
   03/30/2024  Patient ID: Maudie VEAR Silvas, female   DOB: 1960/11/14, 63 y.o.   MRN: [id number]  PA for Wegovy  0.25mg  weekly has been approved, but [facility].  Order for 1 month pending for Dr. Vicci to sign if in agreement.  Will follow-up with patient in 4 weeks to discuss possibly increasing dose based on tolerance.efficacy.  [facility] to [facility] 21mg  pat[facility] (patient requested), but they stated they never received the prescription Dr. Fernand sent on 10/2.  Order pending for Dr. Vicci to sign if in agreement.  Channing DELENA Mealing, PharmD, DPLA

## 2024-03-31 ENCOUNTER — Telehealth: Payer: Self-pay

## 2024-03-31 MED ORDER — NICOTINE 21 MG/24HR TD PT24: 21.0000 mg | MEDICATED_PATCH | TRANSDERMAL | 1 refills | Status: AC

## 2024-03-31 MED ORDER — WEGOVY 0.25 MG/0.5ML ~~LOC~~ SOAJ: 0.2500 mg | SUBCUTANEOUS | 0 refills | Status: DC

## 2024-03-31 NOTE — Progress Notes (Signed)
   03/31/2024  Patient ID: Hannah Kaiser, female   DOB: Nov 13, 1960, 63 y.o.   MRN: [id number]  PCP sent orders for Wegovy  0.25mg  and Nicotine 21mg  patches [facility].  Attempted to contact pharmacy to verify both were going through on Medicaid, but I was not able to get anyone to answer at the pharmacy.  Dr. Annemarie reflects these were both filled today and went through on insurance.  Attempted to contact patient to make her aware but had to leave a voicemail with my direct phone number.  Sending a MyChart message with this information and to schedule a follow-up visit in 4 weeks.  Hannah Kaiser, PharmD, DPLA

## 2024-04-03 ENCOUNTER — Encounter: Attending: Cardiovascular Disease

## 2024-04-03 VITALS — Ht 64.5 in | Wt 241.2 lb

## 2024-04-03 DIAGNOSIS — Z713 Dietary counseling and surveillance: Secondary | ICD-10-CM | POA: Insufficient documentation

## 2024-04-03 DIAGNOSIS — Z955 Presence of coronary angioplasty implant and graft: Secondary | ICD-10-CM | POA: Insufficient documentation

## 2024-04-03 DIAGNOSIS — Z48812 Encounter for surgical aftercare following surgery on the circulatory system: Secondary | ICD-10-CM | POA: Diagnosis present

## 2024-04-03 NOTE — Patient Instructions (Signed)
 Patient Instructions  Patient Details  Name: Hannah Kaiser MRN: [id number] Date of Birth: 1960/11/19 Referring Provider:  Fernand Denyse LABOR, MD  Below are your personal goals for exercise, nutrition, and risk factors. Our goal is to help you stay on track towards obtaining and maintaining these goals. We will be discussing your progress on these goals with you throughout the program.  Initial Exercise Prescription:  Initial Exercise Prescription - 04/03/24 1400       Date of Initial Exercise RX and Referring Provider   Date 04/03/24    Referring Provider Dr. Denyse Fernand, MD      Oxygen   Maintain Oxygen Saturation 88% or higher      Treadmill   MPH 1.6    Grade 0    Minutes 15    METs 2.23      Recumbant Bike   Level 1    RPM 50    Watts 15    Minutes 15    METs 1.9      NuStep   Level 2    SPM 80    Minutes 15    METs 1.9      Prescription Details   Frequency (times per week) 2    Duration Progress to 30 minutes of continuous aerobic without signs/symptoms of physical distress      Intensity   THRR 40-80% of Max Heartrate 96-138    Ratings of Perceived Exertion 11-13    Perceived Dyspnea 0-4      Progression   Progression Continue to progress workloads to maintain intensity without signs/symptoms of physical distress.      Resistance Training   Training Prescription Yes    Weight 3 lb    Reps 10-15          Exercise Goals: Frequency: Be able to perform aerobic exercise two to three times per week in program working toward 2-5 days per week of home exercise.  Intensity: Work with a perceived exertion of 11 (fairly light) - 15 (hard) while following your exercise prescription.  We will make changes to your prescription with you as you progress through the program.   Duration: Be able to do 30 to 45 minutes of continuous aerobic exercise in addition to a 5 minute warm-up and a 5 minute cool-down routine.   Nutrition Goals: Your personal nutrition goals  will be established when you do your nutrition analysis with the dietician.  The following are general nutrition guidelines to follow: Cholesterol < 200mg /day Sodium < 1500mg /day Fiber: Women over 50 yrs - 21 grams per day  Personal Goals:  Personal Goals and Risk Factors at Admission - 04/03/24 0943       Core Components/Risk Factors/Patient Goals on Admission    Weight Management Yes;Weight Loss    Intervention Weight Management: Develop a combined nutrition and exercise program designed to reach desired caloric intake, while maintaining appropriate intake of nutrient and fiber, sodium and fats, and appropriate energy expenditure required for the weight goal.;Weight Management: Provide education and appropriate resources to help participant work on and attain dietary goals.;Weight Management/Obesity: Establish reasonable short term and long term weight goals.;Obesity: Provide education and appropriate resources to help participant work on and attain dietary goals.    Goal Weight: Long Term 160 lb (72.6 kg)    Expected Outcomes Short Term: Continue to assess and modify interventions until short term weight is achieved;Weight Loss: Understanding of general recommendations for a balanced deficit meal plan, which promotes 1-2 lb weight  loss per week and includes a negative energy balance of [phone] kcal/d;Understanding recommendations for meals to include 15-35% energy as protein, 25-35% energy from fat, 35-60% energy from carbohydrates, less than 200mg  of dietary cholesterol, 20-35 gm of total fiber daily;Understanding of distribution of calorie intake throughout the day with the consumption of 4-5 meals/snacks    Tobacco Cessation Yes    Number of packs per day 1 ppd    Intervention Offer self-teaching materials, assist with locating and accessing local/national Quit Smoking programs, and support quit date choice.;Assist the participant in steps to quit. Provide individualized education and  counseling about committing to Tobacco Cessation, relapse prevention, and pharmacological support that can be provided by physician.    Expected Outcomes Short Term: Will demonstrate readiness to quit, by selecting a quit date.;Long Term: Complete abstinence from all tobacco products for at least 12 months from quit date.;Short Term: Will quit all tobacco product use, adhering to prevention of relapse plan.    Hypertension Yes    Intervention Provide education on lifestyle modifcations including regular physical activity/exercise, weight management, moderate sodium restriction and increased consumption of fresh fruit, vegetables, and low fat dairy, alcohol moderation, and smoking cessation.;Monitor prescription use compliance.    Expected Outcomes Short Term: Continued assessment and intervention until BP is < 140/65mm HG in hypertensive participants. < 130/61mm HG in hypertensive participants with diabetes, heart failure or chronic kidney disease.;Long Term: Maintenance of blood pressure at goal levels.    Lipids Yes    Intervention Provide education and support for participant on nutrition & aerobic/resistive exercise along with prescribed medications to achieve LDL 70mg , HDL >40mg .    Expected Outcomes Short Term: Participant states understanding of desired cholesterol values and is compliant with medications prescribed. Participant is following exercise prescription and nutrition guidelines.;Long Term: Cholesterol controlled with medications as prescribed, with individualized exercise RX and with personalized nutrition plan. Value goals: LDL < 70mg , HDL > 40 mg.         Exercise Goals and Review:  Exercise Goals     Row Name 04/03/24 1151             Exercise Goals   Increase Physical Activity Yes       Intervention Provide advice, education, support and counseling about physical activity/exercise needs.;Develop an individualized exercise prescription for aerobic and resistive training  based on initial evaluation findings, risk stratification, comorbidities and participant's personal goals.       Expected Outcomes Short Term: Attend rehab on a regular basis to increase amount of physical activity.;Long Term: Add in home exercise to make exercise part of routine and to increase amount of physical activity.;Long Term: Exercising regularly at least 3-5 days a week.       Increase Strength and Stamina Yes       Intervention Provide advice, education, support and counseling about physical activity/exercise needs.;Develop an individualized exercise prescription for aerobic and resistive training based on initial evaluation findings, risk stratification, comorbidities and participant's personal goals.       Expected Outcomes Short Term: Increase workloads from initial exercise prescription for resistance, speed, and METs.;Short Term: Perform resistance training exercises routinely during rehab and add in resistance training at home;Long Term: Improve cardiorespiratory fitness, muscular endurance and strength as measured by increased METs and functional capacity ( )       Able to understand and use rate of perceived exertion (RPE) scale Yes       Intervention Provide education and explanation on how to use RPE scale  Expected Outcomes Short Term: Able to use RPE daily in rehab to express subjective intensity level;Long Term:  Able to use RPE to guide intensity level when exercising independently       Able to understand and use Dyspnea scale Yes       Intervention Provide education and explanation on how to use Dyspnea scale       Expected Outcomes Short Term: Able to use Dyspnea scale daily in rehab to express subjective sense of shortness of breath during exertion;Long Term: Able to use Dyspnea scale to guide intensity level when exercising independently       Knowledge and understanding of Target Heart Rate Range (THRR) Yes       Intervention Provide education and explanation of  THRR including how the numbers were predicted and where they are located for reference       Expected Outcomes Short Term: Able to state/look up THRR;Long Term: Able to use THRR to govern intensity when exercising independently;Short Term: Able to use daily as guideline for intensity in rehab       Able to check pulse independently Yes       Intervention Provide education and demonstration on how to check pulse in carotid and radial arteries.;Review the importance of being able to check your own pulse for safety during independent exercise       Expected Outcomes Short Term: Able to explain why pulse checking is important during independent exercise;Long Term: Able to check pulse independently and accurately       Understanding of Exercise Prescription Yes       Intervention Provide education, explanation, and written materials on patient's individual exercise prescription       Expected Outcomes Short Term: Able to explain program exercise prescription;Long Term: Able to explain home exercise prescription to exercise independently

## 2024-04-03 NOTE — Progress Notes (Signed)
 Cardiac Individual Treatment Plan  Patient Details  Name: Hannah Kaiser MRN: [id number] Date of Birth: 1960-07-07 Referring Provider:   Flowsheet Row Cardiac Rehab from 04/03/2024 in [facility] Cardiac and Pulmonary Rehab  Referring Provider Dr. Denyse Bathe, MD    Initial Encounter Date:  Flowsheet Row Cardiac Rehab from 04/03/2024 in [facility] Cardiac and Pulmonary Rehab  Date 04/03/24    Visit Diagnosis: Status post coronary artery stent placement  Patient's Home Medications on Admission:  Current Outpatient Medications:    albuterol  (VENTOLIN  HFA) 108 (90 Base) MCG/ACT inhaler, Inhale 1-2 puffs into the lungs every 6 (six) hours as needed for wheezing or shortness of breath., Disp: 18 each, Rfl: 1   aluminum-magnesium hydroxide-simethicone  (MAALOX) 200-200-20 MG/5ML SUSP, Take 30 mLs by mouth 4 (four) times daily -  before meals and at bedtime., Disp: 1680 mL, Rfl: 0   amLODipine  (NORVASC ) 10 MG tablet, Take 1 tablet (10 mg total) by mouth daily., Disp: 90 tablet, Rfl: 1   aspirin  EC 81 MG tablet, Take 1 tablet (81 mg total) by mouth daily. Swallow whole., Disp: 30 tablet, Rfl: 12   atenolol  (TENORMIN ) 50 MG tablet, Take 1 tablet (50 mg total) by mouth daily., Disp: 90 tablet, Rfl: 1   budesonide -glycopyrrolate -formoterol  (BREZTRI  AEROSPHERE) 160-9-4.8 MCG/ACT AERO inhaler, Inhale 2 puffs into the lungs 2 (two) times daily., Disp: , Rfl:    busPIRone  (BUSPAR ) 15 MG tablet, Take 1 tablet (15 mg total) by mouth 3 (three) times daily., Disp: 270 tablet, Rfl: 1   clonazePAM  (KLONOPIN ) 1 MG tablet, Take 1 tablet (1 mg total) by mouth 2 (two) times daily as needed. for anxiety, Disp: 60 tablet, Rfl: 0   Evolocumab  (REPATHA  SURECLICK) 140 MG/ML SOAJ, Inject 140 mg into the skin every 14 (fourteen) days., Disp: 2 mL, Rfl: 2   ezetimibe  (ZETIA ) 10 MG tablet, Take 1 tablet (10 mg total) by mouth daily., Disp: 90 tablet, Rfl: 1   FLUoxetine  (PROZAC ) 40 MG capsule, Take 2 capsules (80 mg total) by mouth  daily., Disp: 180 capsule, Rfl: 1   furosemide  (LASIX ) 20 MG tablet, TAKE 1 TABLET BY MOUTH ONCE DAILY AS NEEDED FOR SWELLING, Disp: 60 tablet, Rfl: 3   hydrOXYzine  (ATARAX ) 25 MG tablet, Take 1 tablet (25 mg total) by mouth 3 (three) times daily as needed., Disp: 270 tablet, Rfl: 1   isosorbide  mononitrate (IMDUR ) 60 MG 24 hr tablet, Take 1 tablet (60 mg total) by mouth daily., Disp: 90 tablet, Rfl: 1   losartan  (COZAAR ) 100 MG tablet, Take 1 tablet (100 mg total) by mouth daily., Disp: 90 tablet, Rfl: 1   mupirocin  ointment (BACTROBAN ) 2 %, Apply 1 application. topically 2 (two) times daily. (Patient taking differently: Apply 1 application  topically daily as needed (Breakouts).), Disp: 22 g, Rfl: 0   nicotine (NICODERM CQ - DOSED IN MG/24 HOURS) 21 mg/24hr patch, Place 1 patch (21 mg total) onto the skin daily., Disp: 28 patch, Rfl: 1   nystatin -triamcinolone  ointment (MYCOLOG), Apply 1 application topically 2 (two) times daily. (Patient taking differently: Apply 1 application  topically 2 (two) times daily as needed (sores that do not heal).), Disp: 30 g, Rfl: 1   ondansetron  (ZOFRAN ) 4 MG tablet, Take 1 tablet (4 mg total) by mouth every 8 (eight) hours as needed for nausea or vomiting., Disp: 60 tablet, Rfl: 3   pantoprazole  (PROTONIX ) 40 MG tablet, Take 1 tablet (40 mg total) by mouth 2 (two) times daily., Disp: 180 tablet, Rfl: 1  prasugrel (EFFIENT) 10 MG TABS tablet, Take 1 tablet (10 mg total) by mouth daily., Disp: 30 tablet, Rfl: 0   QUEtiapine  Fumarate (SEROQUEL  XR) 150 MG 24 hr tablet, Take 1 tablet (150 mg total) by mouth at bedtime., Disp: 90 tablet, Rfl: 0   ranolazine  (RANEXA ) 500 MG 12 hr tablet, TAKE 1 TABLET BY MOUTH TWICE A DAY, Disp: 180 tablet, Rfl: 0   semaglutide -weight management (WEGOVY ) 0.25 MG/0.5ML SOAJ SQ injection, Inject 0.25 mg into the skin once a week., Disp: 2 mL, Rfl: 0   tiZANidine  (ZANAFLEX ) 4 MG tablet, Take 1 tablet (4 mg total) by mouth every 8 (eight)  hours as needed for muscle spasms., Disp: 90 tablet, Rfl: 1   triamcinolone  ointment (KENALOG ) 0.5 %, Apply 1 application topically 2 (two) times daily. (Patient taking differently: Apply 1 application  topically daily as needed (Breakout).), Disp: 30 g, Rfl: 0   valACYclovir  (VALTREX ) 1000 MG tablet, Take 1 tablet (1,000 mg total) by mouth daily. (Patient taking differently: Take 1,000 mg by mouth daily as needed (Breakout).), Disp: 180 tablet, Rfl: 1   Vitamin D , Ergocalciferol , (DRISDOL ) 1.25 MG (50000 UNIT) CAPS capsule, TAKE 1 CAPSULE BY MOUTH ONE TIME PER WEEK, Disp: 12 capsule, Rfl: 0  Past Medical History: Past Medical History:  Diagnosis Date   Anemia    Anxiety    Arthritis    knees, thoracic spurs and arthritis   Asthma    Breast pain    CHF (congestive heart failure) (HCC)    Colon polyps    COPD (chronic obstructive pulmonary disease) (HCC)    Depression    Dysrhythmia    GERD (gastroesophageal reflux disease)    Glaucoma    Heart murmur    History of blood transfusion    Hypertension    Motion sickness    cars   Multilevel degenerative disc disease    Neuropathy    Bilateral arms and legs   Nipple discharge    Numbness of both lower extremities    bulging lumbar discs - per pt   Numbness of upper extremity    bilateral - degenerative cervical discs - per pt   Pneumonia    PONV (postoperative nausea and vomiting)    Sleep apnea    has CPAP   Smokers' cough (HCC)    Swelling of limb 03/11/2021   Wears dentures    full upper and lower    Tobacco Use: Social History   Tobacco Use  Smoking Status Every Day   Current packs/day: 1.50   Average packs/day: 1.5 packs/day for 41.0 years (61.5 ttl pk-yrs)   Types: Cigarettes  Smokeless Tobacco Never  Tobacco Comments   since age 20    Labs: Review Flowsheet  More data exists      Latest Ref Rng & Units 03/25/2022 10/06/2022 05/17/2023 10/19/2023 12/27/2023  Labs for ITP Cardiac and Pulmonary Rehab   Cholestrol 100 - 199 mg/dL 775  836  851  772  -  LDL (calc) 0 - 99 mg/dL 844  88  70  849  -  HDL-C >39 mg/dL 49  53  54  53  -  Trlycerides 0 - 149 mg/dL 887  873  860  864  -  Hemoglobin A1c 4.8 - 5.6 % 5.5  - 5.7  5.4  5.6      Exercise Target Goals: Exercise Program Goal: Individual exercise prescription set using results from initial 6 min walk test and THRR while considering  patient's activity barriers and safety.   Exercise Prescription Goal: Initial exercise prescription builds to 30-45 minutes a day of aerobic activity, 2-3 days per week.  Home exercise guidelines will be given to patient during program as part of exercise prescription that the participant will acknowledge.   Education: Aerobic Exercise: - Group verbal and visual presentation on the components of exercise prescription. Introduces F.I.T.T principle from ACSM for exercise prescriptions.  Reviews F.I.T.T. principles of aerobic exercise including progression. Written material provided at class time.   Education: Resistance Exercise: - Group verbal and visual presentation on the components of exercise prescription. Introduces F.I.T.T principle from ACSM for exercise prescriptions  Reviews F.I.T.T. principles of resistance exercise including progression. Written material provided at class time.    Education: Exercise & Equipment Safety: - Individual verbal instruction and demonstration of equipment use and safety with use of the equipment.   Education: Exercise Physiology & General Exercise Guidelines: - Group verbal and written instruction with models to review the exercise physiology of the cardiovascular system and associated critical values. Provides general exercise guidelines with specific guidelines to those with heart or lung disease. Written material provided at class time.   Education: Flexibility, Balance, Mind/Body Relaxation: - Group verbal and visual presentation with interactive activity on the  components of exercise prescription. Introduces F.I.T.T principle from ACSM for exercise prescriptions. Reviews F.I.T.T. principles of flexibility and balance exercise training including progression. Also discusses the mind body connection.  Reviews various relaxation techniques to help reduce and manage stress (i.e. Deep breathing, progressive muscle relaxation, and visualization). Balance handout provided to take home. Written material provided at class time.   Activity Barriers & Risk Stratification:  Activity Barriers & Cardiac Risk Stratification - 04/03/24 1403       Activity Barriers & Cardiac Risk Stratification   Activity Barriers Arthritis;Back Problems;Neck/Spine Problems;Balance Concerns;Shortness of Breath;Deconditioning    Cardiac Risk Stratification High          6 Minute Walk:  6 Minute Walk     Row Name 04/03/24 1402         6 Minute Walk   Phase Initial     Distance 975 feet     Walk Time 6 minutes     # of Rest Breaks 0     MPH 1.85     METS 1.9     RPE 11     Perceived Dyspnea  1     VO2 Peak 6.65     Symptoms Yes (comment)     Comments chest discomfort     Resting HR 55 bpm     Resting BP 126/72     Resting Oxygen Saturation  96 %     Exercise Oxygen Saturation  during 6 min walk 99 %     Max Ex. HR 78 bpm     Max Ex. BP 140/78     2 Minute Post BP 122/72        Oxygen Initial Assessment:   Oxygen Re-Evaluation:   Oxygen Discharge (Final Oxygen Re-Evaluation):   Initial Exercise Prescription:  Initial Exercise Prescription - 04/03/24 1400       Date of Initial Exercise RX and Referring Provider   Date 04/03/24    Referring Provider Dr. Denyse Bathe, MD      Oxygen   Maintain Oxygen Saturation 88% or higher      Treadmill   MPH 1.6    Grade 0    Minutes 15    METs 2.23  Recumbant Bike   Level 1    RPM 50    Watts 15    Minutes 15    METs 1.9      NuStep   Level 2    SPM 80    Minutes 15    METs 1.9       Prescription Details   Frequency (times per week) 2    Duration Progress to 30 minutes of continuous aerobic without signs/symptoms of physical distress      Intensity   THRR 40-80% of Max Heartrate 96-138    Ratings of Perceived Exertion 11-13    Perceived Dyspnea 0-4      Progression   Progression Continue to progress workloads to maintain intensity without signs/symptoms of physical distress.      Resistance Training   Training Prescription Yes    Weight 3 lb    Reps 10-15          Perform Capillary Blood Glucose checks as needed.  Exercise Prescription Changes:   Exercise Prescription Changes     Row Name 04/03/24 1400             Response to Exercise   Blood Pressure (Admit) 126/72       Blood Pressure (Exercise) 140/78       Blood Pressure (Exit) 122/72       Heart Rate (Admit) 55 bpm       Heart Rate (Exercise) 78 bpm       Heart Rate (Exit) 59 bpm       Oxygen Saturation (Admit) 96 %       Oxygen Saturation (Exercise) 99 %       Rating of Perceived Exertion (Exercise) 11       Perceived Dyspnea (Exercise) 1       Symptoms chest discomfort       Comments Results          Exercise Comments:   Exercise Goals and Review:   Exercise Goals     Row Name 04/03/24 1151             Exercise Goals   Increase Physical Activity Yes       Intervention Provide advice, education, support and counseling about physical activity/exercise needs.;Develop an individualized exercise prescription for aerobic and resistive training based on initial evaluation findings, risk stratification, comorbidities and participant's personal goals.       Expected Outcomes Short Term: Attend rehab on a regular basis to increase amount of physical activity.;Long Term: Add in home exercise to make exercise part of routine and to increase amount of physical activity.;Long Term: Exercising regularly at least 3-5 days a week.       Increase Strength and Stamina Yes        Intervention Provide advice, education, support and counseling about physical activity/exercise needs.;Develop an individualized exercise prescription for aerobic and resistive training based on initial evaluation findings, risk stratification, comorbidities and participant's personal goals.       Expected Outcomes Short Term: Increase workloads from initial exercise prescription for resistance, speed, and METs.;Short Term: Perform resistance training exercises routinely during rehab and add in resistance training at home;Long Term: Improve cardiorespiratory fitness, muscular endurance and strength as measured by increased METs and functional capacity ( )       Able to understand and use rate of perceived exertion (RPE) scale Yes       Intervention Provide education and explanation on how to use RPE scale  Expected Outcomes Short Term: Able to use RPE daily in rehab to express subjective intensity level;Long Term:  Able to use RPE to guide intensity level when exercising independently       Able to understand and use Dyspnea scale Yes       Intervention Provide education and explanation on how to use Dyspnea scale       Expected Outcomes Short Term: Able to use Dyspnea scale daily in rehab to express subjective sense of shortness of breath during exertion;Long Term: Able to use Dyspnea scale to guide intensity level when exercising independently       Knowledge and understanding of Target Heart Rate Range (THRR) Yes       Intervention Provide education and explanation of THRR including how the numbers were predicted and where they are located for reference       Expected Outcomes Short Term: Able to state/look up THRR;Long Term: Able to use THRR to govern intensity when exercising independently;Short Term: Able to use daily as guideline for intensity in rehab       Able to check pulse independently Yes       Intervention Provide education and demonstration on how to check pulse in carotid and  radial arteries.;Review the importance of being able to check your own pulse for safety during independent exercise       Expected Outcomes Short Term: Able to explain why pulse checking is important during independent exercise;Long Term: Able to check pulse independently and accurately       Understanding of Exercise Prescription Yes       Intervention Provide education, explanation, and written materials on patient's individual exercise prescription       Expected Outcomes Short Term: Able to explain program exercise prescription;Long Term: Able to explain home exercise prescription to exercise independently          Exercise Goals Re-Evaluation :   Discharge Exercise Prescription (Final Exercise Prescription Changes):  Exercise Prescription Changes - 04/03/24 1400       Response to Exercise   Blood Pressure (Admit) 126/72    Blood Pressure (Exercise) 140/78    Blood Pressure (Exit) 122/72    Heart Rate (Admit) 55 bpm    Heart Rate (Exercise) 78 bpm    Heart Rate (Exit) 59 bpm    Oxygen Saturation (Admit) 96 %    Oxygen Saturation (Exercise) 99 %    Rating of Perceived Exertion (Exercise) 11    Perceived Dyspnea (Exercise) 1    Symptoms chest discomfort    Comments Results          Nutrition:  Target Goals: Understanding of nutrition guidelines, daily intake of sodium 1500mg , cholesterol 200mg , calories 30% from fat and 7% or less from saturated fats, daily to have 5 or more servings of fruits and vegetables.  Education: Nutrition 1 -Group instruction provided by verbal, written material, interactive activities, discussions, models, and posters to present general guidelines for heart healthy nutrition including macronutrients, label reading, and promoting whole foods over processed counterparts. Education serves as pensions consultant of discussion of heart healthy eating for all. Written material provided at class time.    Education: Nutrition 2 -Group instruction provided  by verbal, written material, interactive activities, discussions, models, and posters to present general guidelines for heart healthy nutrition including sodium, cholesterol, and saturated fat. Providing guidance of habit forming to improve blood pressure, cholesterol, and body weight. Written material provided at class time.     Biometrics:  Pre Biometrics -  04/03/24 1153       Pre Biometrics   Height 5' 4.5 (1.638 m)    Weight 241 lb 3.2 oz (109.4 kg)    Waist Circumference 44.5 inches    Hip Circumference 50 inches    Waist to Hip Ratio 0.89 %    BMI (Calculated) 40.78    Single Leg Stand 3.33 seconds           Nutrition Therapy Plan and Nutrition Goals:  Nutrition Therapy & Goals - 04/03/24 1337       Nutrition Therapy   Diet Cardiac, Low Na    Protein (specify units) 80    Fiber 25 grams    Whole Grain Foods 3 servings    Saturated Fats 15 max. grams    Fruits and Vegetables 5 servings/day    Sodium 2 grams      Personal Nutrition Goals   Nutrition Goal Eat 3 times per day, small frequent meals or nutrient dense snacks    Personal Goal #2 Read labels and reduce sodium intake to below 2300mg . Ideally 1500mg  per day.    Personal Goal #3 Reduce saturated fat, less than 12g per day. Replace bad fats for more heart healthy fats.    Comments Patient drinking mostly fruit juice with some water . She knows the juice is not good for her, set goal to work on cutting back on juice and increasing water  intake. She is on wegovy  and reports she often misses lunch. Spoke with her about eating 3 times per day, using nutrient dense snacks. Brainstormed several small and snack ideas. Reviewed mediterranean diet handout. Educated on types of fats, sources and how to read labels. Provided guideline limits of less than 1500mg  sodium and less than 12g saturated fat per day      Intervention Plan   Intervention Prescribe, educate and counsel regarding individualized specific dietary  modifications aiming towards targeted core components such as weight, hypertension, lipid management, diabetes, heart failure and other comorbidities.;Nutrition handout(s) given to patient.    Expected Outcomes Short Term Goal: Understand basic principles of dietary content, such as calories, fat, sodium, cholesterol and nutrients.;Short Term Goal: A plan has been developed with personal nutrition goals set during dietitian appointment.;Long Term Goal: Adherence to prescribed nutrition plan.          Nutrition Assessments:  MEDIFICTS Score Key: >=70 Need to make dietary changes  40-70 Heart Healthy Diet <= 40 Therapeutic Level Cholesterol Diet   Picture Your Plate Scores: <59 Unhealthy dietary pattern with much room for improvement. 41-50 Dietary pattern unlikely to meet recommendations for good health and room for improvement. 51-60 More healthful dietary pattern, with some room for improvement.  >60 Healthy dietary pattern, although there may be some specific behaviors that could be improved.    Nutrition Goals Re-Evaluation:   Nutrition Goals Discharge (Final Nutrition Goals Re-Evaluation):   Psychosocial: Target Goals: Acknowledge presence or absence of significant depression and/or stress, maximize coping skills, provide positive support system. Participant is able to verbalize types and ability to use techniques and skills needed for reducing stress and depression.   Education: Stress, Anxiety, and Depression - Group verbal and visual presentation to define topics covered.  Reviews how body is impacted by stress, anxiety, and depression.  Also discusses healthy ways to reduce stress and to treat/manage anxiety and depression. Written material provided at class time.   Education: Sleep Hygiene -Provides group verbal and written instruction about how sleep can affect your health.  Define sleep  hygiene, discuss sleep cycles and impact of sleep habits. Review good sleep hygiene  tips.   Initial Review & Psychosocial Screening:  Initial Psych Review & Screening - 04/03/24 0948       Initial Review   Current issues with Current Stress Concerns    Source of Stress Concerns Family;Chronic Illness      Family Dynamics   Good Support System? Yes      Barriers   Psychosocial barriers to participate in program There are no identifiable barriers or psychosocial needs.;The patient should benefit from training in stress management and relaxation.      Screening Interventions   Interventions Encouraged to exercise;Provide feedback about the scores to participant;To provide support and resources with identified psychosocial needs    Expected Outcomes Short Term goal: Utilizing psychosocial counselor, staff and physician to assist with identification of specific Stressors or current issues interfering with healing process. Setting desired goal for each stressor or current issue identified.;Long Term Goal: Stressors or current issues are controlled or eliminated.;Short Term goal: Identification and review with participant of any Quality of Life or Depression concerns found by scoring the questionnaire.;Long Term goal: The participant improves quality of Life and PHQ9 Scores as seen by post scores and/or verbalization of changes          Quality of Life Scores:   Quality of Life - 04/03/24 1143       Quality of Life   Select Quality of Life      Quality of Life Scores   Health/Function Pre 21.1 %    Socioeconomic Pre 26.94 %    Psych/Spiritual Pre 24.86 %    Family Pre 17.7 %    GLOBAL Pre 22.7 %         Scores of 19 and below usually indicate a poorer quality of life in these areas.  A difference of  2-3 points is a clinically meaningful difference.  A difference of 2-3 points in the total score of the Quality of Life Index has been associated with significant improvement in overall quality of life, self-image, physical symptoms, and general health in studies  assessing change in quality of life.  PHQ-9: Review Flowsheet  More data exists      04/03/2024 01/27/2024 11/30/2023 10/25/2023 10/19/2023  Depression screen PHQ 2/9  Decreased Interest 1 1 1 2 3   Down, Depressed, Hopeless 0 0 1 1 0  PHQ - 2 Score 1 1 2 3 3   Altered sleeping 1 1 0 0 2  Tired, decreased energy 2 2 2 3 3   Change in appetite 1 2 1 3  -  Feeling bad or failure about yourself  0 0 0 0 0  Trouble concentrating 3 2 1 2 2   Moving slowly or fidgety/restless 1 0 0 0 0  Suicidal thoughts 0 0 0 0 0  PHQ-9 Score 9 8 6 11 10   Difficult doing work/chores Somewhat difficult Somewhat difficult Somewhat difficult Somewhat difficult Somewhat difficult   Interpretation of Total Score  Total Score Depression Severity:  1-4 = Minimal depression, 5-9 = Mild depression, 10-14 = Moderate depression, 15-19 = Moderately severe depression, 20-27 = Severe depression   Psychosocial Evaluation and Intervention:  Psychosocial Evaluation - 04/03/24 1014       Psychosocial Evaluation & Interventions   Comments Ms. Tourville is coming to cardiac rehab post stents. She is still having some chest discomfort at times and feels more tired than before her stent. She is in contact with her doctor  and thinks it may be some of the medicaiton she is on. She is hopeful that becoming more active will help with her symptoms some. She is interested in quitting smoking, but it is her destressor so she wants to find something that will help with her stress. 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 She helps her sister and mentions it is a high stress environment. She states she knows she needs to focus on her own health after all this time of helping others, and is ready to do so. Resources given for local mental health counselors. She works full time as a producer, television/film/video. She is ready to commit to the program     Expected Outcomes Short: attend cardiac rehab for education and exercise Long: develop and maintain positive self care habits    Continue Psychosocial Services  Follow up required by staff          Psychosocial Re-Evaluation:   Psychosocial Discharge (Final Psychosocial Re-Evaluation):   Vocational Rehabilitation: Provide vocational rehab assistance to qualifying candidates.   Vocational Rehab Evaluation & Intervention:  Vocational Rehab - 04/03/24 0947       Initial Vocational Rehab Evaluation & Intervention   Assessment shows need for Vocational Rehabilitation No          Education: Education Goals: Education classes will be provided on a variety of topics geared toward better understanding of heart health and risk factor modification. Participant will state understanding/return demonstration of topics presented as noted by education test scores.  Learning Barriers/Preferences:  Learning Barriers/Preferences - 04/03/24 0947       Learning Barriers/Preferences   Learning Barriers None    Learning Preferences Individual Instruction;Skilled Demonstration;Video          General Cardiac Education Topics:  AED/CPR: - Group verbal and written instruction with the use of models to demonstrate the basic use of the AED with the basic ABC's of resuscitation.   Test and Procedures: - Group verbal and visual presentation and models provide information about basic cardiac anatomy and function. Reviews the testing methods done to diagnose heart disease and the outcomes of the test results. Describes the treatment choices: Medical Management, Angioplasty, or Coronary Bypass Surgery for treating various heart conditions including Myocardial Infarction, Angina, Valve Disease, and Cardiac Arrhythmias. Written material provided at class time.   Medication Safety: - Group verbal and visual instruction to review commonly prescribed medications for heart and lung disease. Reviews the  medication, class of the drug, and side effects. Includes the steps to properly store meds and maintain the prescription regimen. Written material provided at class time.   Intimacy: - Group verbal instruction through game format to discuss how heart and lung disease can affect sexual intimacy. Written material provided at class time.   Know Your Numbers and Heart Failure: - Group verbal and visual instruction to discuss disease risk factors for cardiac and pulmonary disease and treatment options.  Reviews associated critical values for Overweight/Obesity, Hypertension, Cholesterol, and Diabetes.  Discusses basics of heart failure: signs/symptoms and treatments.  Introduces Heart Failure Zone chart for action plan for heart failure. Written material provided at class time.   Infection Prevention: - Provides verbal and written material to individual with discussion of infection control including proper hand washing and proper equipment cleaning during exercise session.   Falls Prevention: - Provides verbal and written material  to individual with discussion of falls prevention and safety.   Other: -Provides group and verbal instruction on various topics (see comments)   Knowledge Questionnaire Score:  Knowledge Questionnaire Score - 04/03/24 1143       Knowledge Questionnaire Score   Pre Score 22/26          Core Components/Risk Factors/Patient Goals at Admission:  Personal Goals and Risk Factors at Admission - 04/03/24 0943       Core Components/Risk Factors/Patient Goals on Admission    Weight Management Yes;Weight Loss    Intervention Weight Management: Develop a combined nutrition and exercise program designed to reach desired caloric intake, while maintaining appropriate intake of nutrient and fiber, sodium and fats, and appropriate energy expenditure required for the weight goal.;Weight Management: Provide education and appropriate resources to help participant work on and  attain dietary goals.;Weight Management/Obesity: Establish reasonable short term and long term weight goals.;Obesity: Provide education and appropriate resources to help participant work on and attain dietary goals.    Goal Weight: Long Term 160 lb (72.6 kg)    Expected Outcomes Short Term: Continue to assess and modify interventions until short term weight is achieved;Weight Loss: Understanding of general recommendations for a balanced deficit meal plan, which promotes 1-2 lb weight loss per week and includes a negative energy balance of [phone] kcal/d;Understanding recommendations for meals to include 15-35% energy as protein, 25-35% energy from fat, 35-60% energy from carbohydrates, less than 200mg  of dietary cholesterol, 20-35 gm of total fiber daily;Understanding of distribution of calorie intake throughout the day with the consumption of 4-5 meals/snacks    Tobacco Cessation Yes    Number of packs per day 1 ppd    Intervention Offer self-teaching materials, assist with locating and accessing local/national Quit Smoking programs, and support quit date choice.;Assist the participant in steps to quit. Provide individualized education and counseling about committing to Tobacco Cessation, relapse prevention, and pharmacological support that can be provided by physician.    Expected Outcomes Short Term: Will demonstrate readiness to quit, by selecting a quit date.;Long Term: Complete abstinence from all tobacco products for at least 12 months from quit date.;Short Term: Will quit all tobacco product use, adhering to prevention of relapse plan.    Hypertension Yes    Intervention Provide education on lifestyle modifcations including regular physical activity/exercise, weight management, moderate sodium restriction and increased consumption of fresh fruit, vegetables, and low fat dairy, alcohol moderation, and smoking cessation.;Monitor prescription use compliance.    Expected Outcomes Short Term: Continued  assessment and intervention until BP is < 140/24mm HG in hypertensive participants. < 130/7mm HG in hypertensive participants with diabetes, heart failure or chronic kidney disease.;Long Term: Maintenance of blood pressure at goal levels.    Lipids Yes    Intervention Provide education and support for participant on nutrition & aerobic/resistive exercise along with prescribed medications to achieve LDL 70mg , HDL >40mg .    Expected Outcomes Short Term: Participant states understanding of desired cholesterol values and is compliant with medications prescribed. Participant is following exercise prescription and nutrition guidelines.;Long Term: Cholesterol controlled with medications as prescribed, with individualized exercise RX and with personalized nutrition plan. Value goals: LDL < 70mg , HDL > 40 mg.          Education:Diabetes - Individual verbal and written instruction to review signs/symptoms of diabetes, desired ranges of glucose level fasting, after meals and with exercise. Acknowledge that pre and post exercise glucose checks will be done for 3 sessions at entry of program.  Core Components/Risk Factors/Patient Goals Review:    Core Components/Risk Factors/Patient Goals at Discharge (Final Review):    ITP Comments:  ITP Comments     Row Name 04/03/24 1011           ITP Comments Completed initial orientation and for cardiac rehab. Initial ITP created and sent for review to Dr. Oneil Pinal, Medical Director.          Comments: Initial ITP

## 2024-04-03 NOTE — Progress Notes (Signed)
 Assessment start time: 10:01 AM   Digestive issues/concerns: no known food allergies   24-hours Recall: B: bacon egg and cheese biscuit  L: nothing D: chicken salad OR chicken salad sandwich   Beverages water , fruit juice  Education r/t nutrition plan Patient drinking mostly fruit juice with some water . She knows the juice is not good for her, set goal to work on cutting back on juice and increasing water  intake. She is on wegovy  and reports she often misses lunch. Spoke with her about eating 3 times per day, using nutrient dense snacks. Brainstormed several small and snack ideas. Reviewed mediterranean diet handout. Educated on types of fats, sources and how to read labels. Provided guideline limits of less than 1500mg  sodium and less than 12g saturated fat per day   Goal 1: Eat 3 times per day, small frequent meals or nutrient dense snacks  Goal 2: Read labels and reduce sodium intake to below 2300mg . Ideally 1500mg  per day.  Goal 3: Reduce saturated fat, less than 12g per day. Replace bad fats for more heart healthy fats.   End time 10:38 AM

## 2024-04-04 ENCOUNTER — Ambulatory Visit: Admitting: Cardiovascular Disease

## 2024-04-05 NOTE — Progress Notes (Signed)
PA has been approved, thanks!

## 2024-04-05 NOTE — Telephone Encounter (Signed)
 Pharmacy Patient Advocate Encounter  Received notification from [employer] that Prior Authorization for Wegovy  has been APPROVED from 04/04/2024 to 09/26/2024   PA #/Case ID/Reference #: [id number]

## 2024-04-10 ENCOUNTER — Encounter: Attending: Cardiovascular Disease | Admitting: Emergency Medicine

## 2024-04-10 DIAGNOSIS — Z955 Presence of coronary angioplasty implant and graft: Secondary | ICD-10-CM | POA: Diagnosis present

## 2024-04-10 NOTE — Progress Notes (Signed)
 Daily Session Note  Patient Details  Name: LAKIYA COTTAM MRN: [id number] Date of Birth: 09-09-1960 Referring Provider:   Flowsheet Row Cardiac Rehab from 04/03/2024 in [facility] Cardiac [facility] Pulmonary Rehab  Referring Provider Dr. Denyse Bathe, MD    Encounter Date: 04/10/2024  Check In:  Session Check In - 04/10/24 1539       Check-In   Supervising physician immediately available to respond to emergencies See telemetry face sheet for immediately available ER MD    Location [facility]-Cardiac & Pulmonary Rehab    Staff Present Rollene Paterson, MS, Exercise Physiologist;Jadavion Spoelstra RN,BSN;Joseph Hood RCP,RRT,BSRT;Maxon Buffalo BS, Exercise Physiologist    Virtual Visit No    Medication changes reported     No    Fall or balance concerns reported    No    Tobacco Cessation Use Decreased    Current number of cigarettes/nicotine per day     10    Warm-up and Cool-down Performed on first and last piece of equipment    Resistance Training Performed Yes    VAD Patient? No    PAD/SET Patient? No      Pain Assessment   Currently in Pain? No/denies             Social History   Tobacco Use  Smoking Status Every Day   Current packs/day: 1.50   Average packs/day: 1.5 packs/day for 41.0 years (61.5 ttl pk-yrs)   Types: Cigarettes  Smokeless Tobacco Never  Tobacco Comments   since age 80    Goals Met:  Independence with exercise equipment Exercise tolerated well No report of concerns or symptoms today Strength training completed today  Goals Unmet:  Not Applicable  Comments: First full day of exercise!  Patient was oriented to gym and equipment including functions, settings, policies, and procedures.  Patient's individual exercise prescription and treatment plan were reviewed.  All starting workloads were established based on the results of the 6 minute walk test done at initial orientation visit.  The plan for exercise progression was also introduced and progression will be customized  based on patient's performance and goals.    Dr. Oneil Pinal is Medical Director for [facility] Cardiac Rehabilitation.  Dr. Fuad Aleskerov is Medical Director for [facility] Pulmonary Rehabilitation.

## 2024-04-11 ENCOUNTER — Ambulatory Visit: Admitting: Family Medicine

## 2024-04-11 ENCOUNTER — Encounter: Payer: Self-pay | Admitting: Family Medicine

## 2024-04-11 VITALS — BP 122/69 | HR 60 | Temp 98.0°F | Ht 64.5 in | Wt 240.2 lb

## 2024-04-11 DIAGNOSIS — G720 Drug-induced myopathy: Secondary | ICD-10-CM | POA: Diagnosis not present

## 2024-04-11 DIAGNOSIS — E782 Mixed hyperlipidemia: Secondary | ICD-10-CM

## 2024-04-11 DIAGNOSIS — Z789 Other specified health status: Secondary | ICD-10-CM

## 2024-04-11 DIAGNOSIS — Z1231 Encounter for screening mammogram for malignant neoplasm of breast: Secondary | ICD-10-CM

## 2024-04-11 DIAGNOSIS — I25112 Atherosclerotic heart disease of native coronary artery with refractory angina pectoris: Secondary | ICD-10-CM | POA: Diagnosis not present

## 2024-04-11 DIAGNOSIS — G4733 Obstructive sleep apnea (adult) (pediatric): Secondary | ICD-10-CM

## 2024-04-11 DIAGNOSIS — F332 Major depressive disorder, recurrent severe without psychotic features: Secondary | ICD-10-CM

## 2024-04-11 DIAGNOSIS — E559 Vitamin D deficiency, unspecified: Secondary | ICD-10-CM

## 2024-04-11 DIAGNOSIS — I129 Hypertensive chronic kidney disease with stage 1 through stage 4 chronic kidney disease, or unspecified chronic kidney disease: Secondary | ICD-10-CM

## 2024-04-11 DIAGNOSIS — Z862 Personal history of diseases of the blood and blood-forming organs and certain disorders involving the immune mechanism: Secondary | ICD-10-CM

## 2024-04-11 DIAGNOSIS — E039 Hypothyroidism, unspecified: Secondary | ICD-10-CM

## 2024-04-11 DIAGNOSIS — F17210 Nicotine dependence, cigarettes, uncomplicated: Secondary | ICD-10-CM

## 2024-04-11 MED ORDER — WEGOVY 0.5 MG/0.5ML ~~LOC~~ SOAJ: 0.5000 mg | SUBCUTANEOUS | 1 refills | Status: DC

## 2024-04-11 MED ORDER — WEGOVY 1 MG/0.5ML ~~LOC~~ SOAJ: 1.0000 mg | SUBCUTANEOUS | 1 refills | Status: DC

## 2024-04-11 NOTE — Assessment & Plan Note (Signed)
 Congratulated patient on 14lb weight loss for 5.5% body weight. Tolerating wegovy  well. Continue to monitor. Continue titration- recheck 6 weeks.

## 2024-04-11 NOTE — Assessment & Plan Note (Signed)
 Unable to tolerate statins. Will continue zetia  and repatha . Call with any concerns.

## 2024-04-11 NOTE — Assessment & Plan Note (Signed)
 Continue to follow with sleep medicine. Await sleep study. Call with any concerns.

## 2024-04-11 NOTE — Assessment & Plan Note (Signed)
 Tolerating her repatha  well. Rechecking labs today. Await results.

## 2024-04-11 NOTE — Assessment & Plan Note (Signed)
 Rechecking labs today. Await results. Treat as needed.

## 2024-04-11 NOTE — Assessment & Plan Note (Signed)
 Under good control on current regimen. Continue current regimen. Continue to monitor. Call with any concerns. Refills given.

## 2024-04-11 NOTE — Assessment & Plan Note (Signed)
 Encouraged abstinence and continue patches. Call with any concerns.

## 2024-04-11 NOTE — Progress Notes (Signed)
 BP 122/69   Pulse 60   Temp 98 F (36.7 C) (Oral)   Ht 5' 4.5 (1.638 m)   Wt 240 lb 3.2 oz (109 kg)   SpO2 96%   BMI 40.59 kg/m    Subjective:    Patient ID: Hannah Kaiser, female    DOB: Oct 04, 1960, 63 y.o.   MRN: [id number]  HPI: Hannah Kaiser is a 63 y.o. female  Chief Complaint  Patient presents with   Obesity   Saw her cardiologist right after she was here last time and had a cardiac cath that showed 90% disease of LAD. She was stented at that time. Went to the ER with chest pain about a week after her stenting and was diagnosed with pneumonia. She was placed on augmentin  and is feeling better, but still really tired. She also started cardiac rehab last week.   Saw sleep medicine about 2 weeks ago and is going to have a sleep study.   OBESITY- started her wegovy  in September, but had to stop it due to insurance coverage- restarted it a week ago Duration: chronic Previous attempts at weight loss: yes- portion control, diet, exercise Complications of obesity: IFG, CAD, HTN, HLD, GERD, back pain Peak weight: 254lbs Weight loss goal: to be healthy Weight loss to date: 14lbs Requesting obesity pharmacotherapy: yes Current weight loss supplements/medications: yes Previous weight loss supplements/meds: no  HYPERTENSION / HYPERLIPIDEMIA Satisfied with current treatment? yes Duration of hypertension: chronic BP monitoring frequency: a few times a week BP medication side effects: no Past BP meds: amlodipine , atenolol  Duration of hyperlipidemia: chronic Cholesterol medication side effects: yes Cholesterol supplements: none Past cholesterol medications: zetia , repatha  Medication compliance: excellent compliance Aspirin : yes Recent stressors: yes Recurrent headaches: no Visual changes: no Palpitations: no Dyspnea: no Chest pain: no Lower extremity edema: no Dizzy/lightheaded: no  HYPOTHYROIDISM Thyroid  control status:controlled Satisfied with current treatment?  yes Medication side effects: no Medication compliance: excellent compliance Recent dose adjustment:no Fatigue: yes Cold intolerance: no Heat intolerance: no Weight gain: no Weight loss: no Constipation: no Diarrhea/loose stools: no Palpitations: no Lower extremity edema: no Anxiety/depressed mood: no  ANXIETY/DEPRESSION Duration: chronic Status:stable Anxious mood: yes  Excessive worrying: no Irritability: no  Sweating: no Nausea: no Palpitations:no Hyperventilation: no Panic attacks: no Agoraphobia: no  Obscessions/compulsions: no Depressed mood: yes    04/11/2024    9:38 AM 04/03/2024   11:37 AM 01/27/2024    1:47 PM 11/30/2023   10:09 AM 10/25/2023    9:27 AM  Depression screen PHQ 2/9  Decreased Interest 2 1 1 1 2   Down, Depressed, Hopeless 1 0 0 1 1  PHQ - 2 Score 3 1 1 2 3   Altered sleeping 1 1 1  0 0  Tired, decreased energy 3 2 2 2 3   Change in appetite 1 1 2 1 3   Feeling bad or failure about yourself  0 0 0 0 0  Trouble concentrating 1 3 2 1 2   Moving slowly or fidgety/restless 0 1 0 0 0  Suicidal thoughts 0 0 0 0 0  PHQ-9 Score 9 9 8 6 11   Difficult doing work/chores Somewhat difficult Somewhat difficult Somewhat difficult Somewhat difficult Somewhat difficult   Anhedonia: no Weight changes: no Insomnia: no   Hypersomnia: no Fatigue/loss of energy: no Feelings of worthlessness: no Feelings of guilt: no Impaired concentration/indecisiveness: no Suicidal ideations: no  Crying spells: no Recent Stressors/Life Changes: no   Relationship problems: no   Family stress: no  Financial stress: no    Job stress: no    Recent death/loss: no   Relevant past medical, surgical, family and social history reviewed and updated as indicated. Interim medical history since our last visit reviewed. Allergies and medications reviewed and updated.  Review of Systems  Constitutional:  Positive for fatigue. Negative for activity change, appetite change, chills,  diaphoresis, fever and unexpected weight change.  Respiratory: Negative.    Cardiovascular: Negative.   Musculoskeletal: Negative.   Neurological: Negative.   Psychiatric/Behavioral: Negative.      Per HPI unless specifically indicated above     Objective:    BP 122/69   Pulse 60   Temp 98 F (36.7 C) (Oral)   Ht 5' 4.5 (1.638 m)   Wt 240 lb 3.2 oz (109 kg)   SpO2 96%   BMI 40.59 kg/m   Wt Readings from Last 3 Encounters:  04/11/24 240 lb 3.2 oz (109 kg)  04/03/24 241 lb 3.2 oz (109.4 kg)  03/20/24 243 lb 9.6 oz (110.5 kg)    Physical Exam Vitals and nursing note reviewed.  Constitutional:      General: She is not in acute distress.    Appearance: Normal appearance. She is not ill-appearing, toxic-appearing or diaphoretic.  HENT:     Head: Normocephalic and atraumatic.     Right Ear: External ear normal.     Left Ear: External ear normal.     Nose: Nose normal.     Mouth/Throat:     Mouth: Mucous membranes are moist.     Pharynx: Oropharynx is clear.  Eyes:     General: No scleral icterus.       Right eye: No discharge.        Left eye: No discharge.     Extraocular Movements: Extraocular movements intact.     Conjunctiva/sclera: Conjunctivae normal.     Pupils: Pupils are equal, round, and reactive to light.  Cardiovascular:     Rate and Rhythm: Normal rate and regular rhythm.     Pulses: Normal pulses.     Heart sounds: Normal heart sounds. No murmur heard.    No friction rub. No gallop.  Pulmonary:     Effort: Pulmonary effort is normal. No respiratory distress.     Breath sounds: Normal breath sounds. No stridor. No wheezing, rhonchi or rales.  Chest:     Chest wall: No tenderness.  Musculoskeletal:        General: Normal range of motion.     Cervical back: Normal range of motion and neck supple.  Skin:    General: Skin is warm and dry.     Capillary Refill: Capillary refill takes less than 2 seconds.     Coloration: Skin is not jaundiced or pale.      Findings: No bruising, erythema, lesion or rash.  Neurological:     General: No focal deficit present.     Mental Status: She is alert and oriented to person, place, and time. Mental status is at baseline.  Psychiatric:        Mood and Affect: Mood normal.        Behavior: Behavior normal.        Thought Content: Thought content normal.        Judgment: Judgment normal.     Results for orders placed or performed during the hospital encounter of 03/13/24  Basic metabolic panel   Collection Time: 03/13/24  4:27 PM  Result Value Ref Range  Sodium 139 135 - 145 mmol/L   Potassium 4.1 3.5 - 5.1 mmol/L   Chloride 104 98 - 111 mmol/L   CO2 24 22 - 32 mmol/L   Glucose, Bld 99 70 - 99 mg/dL   BUN 17 8 - 23 mg/dL   Creatinine, Ser 9.21 0.44 - 1.00 mg/dL   Calcium  9.1 8.9 - 10.3 mg/dL   GFR, Estimated >39 >39 mL/min   Anion gap 11 5 - 15  CBC   Collection Time: 03/13/24  4:27 PM  Result Value Ref Range   WBC 8.0 4.0 - 10.5 K/uL   RBC 4.08 3.87 - 5.11 MIL/uL   Hemoglobin 11.8 (L) 12.0 - 15.0 g/dL   HCT 64.3 (L) 63.9 - 53.9 %   MCV 87.3 80.0 - 100.0 fL   MCH 28.9 26.0 - 34.0 pg   MCHC 33.1 30.0 - 36.0 g/dL   RDW 86.2 88.4 - 84.4 %   Platelets 260 150 - 400 K/uL   nRBC 0.0 0.0 - 0.2 %  Troponin I (High Sensitivity)   Collection Time: 03/13/24  4:27 PM  Result Value Ref Range   Troponin I (High Sensitivity) 16 <18 ng/L  Hepatic function panel   Collection Time: 03/13/24  6:44 PM  Result Value Ref Range   Total Protein 7.3 6.5 - 8.1 g/dL   Albumin 4.3 3.5 - 5.0 g/dL   AST 20 15 - 41 U/L   ALT 11 0 - 44 U/L   Alkaline Phosphatase 51 38 - 126 U/L   Total Bilirubin 0.8 0.0 - 1.2 mg/dL   Bilirubin, Direct <9.8 0.0 - 0.2 mg/dL   Indirect Bilirubin NOT CALCULATED 0.3 - 0.9 mg/dL  Lipase, blood   Collection Time: 03/13/24  6:44 PM  Result Value Ref Range   Lipase 25 11 - 51 U/L  Troponin I (High Sensitivity)   Collection Time: 03/13/24  6:44 PM  Result Value Ref Range    Troponin I (High Sensitivity) 17 <18 ng/L  Urinalysis, Complete w Microscopic -Urine, Clean Catch   Collection Time: 03/13/24  7:46 PM  Result Value Ref Range   Color, Urine YELLOW (A) YELLOW   APPearance CLEAR (A) CLEAR   Specific Gravity, Urine 1.009 1.005 - 1.030   pH 6.0 5.0 - 8.0   Glucose, UA NEGATIVE NEGATIVE mg/dL   Hgb urine dipstick NEGATIVE NEGATIVE   Bilirubin Urine NEGATIVE NEGATIVE   Ketones, ur NEGATIVE NEGATIVE mg/dL   Protein, ur NEGATIVE NEGATIVE mg/dL   Nitrite NEGATIVE NEGATIVE   Leukocytes,Ua NEGATIVE NEGATIVE   RBC / HPF 0-5 0 - 5 RBC/hpf   WBC, UA 0-5 0 - 5 WBC/hpf   Bacteria, UA RARE (A) NONE SEEN   Squamous Epithelial / HPF 0-5 0 - 5 /HPF  Troponin I (High Sensitivity)   Collection Time: 03/13/24 10:55 PM  Result Value Ref Range   Troponin I (High Sensitivity) 14 <18 ng/L      Assessment & Plan:   Problem List Items Addressed This Visit       Cardiovascular and Mediastinum   CAD (coronary artery disease)   Continue to follow with cardiology. Continue cardiac rehab. Will keep her BP and cholesterol under good control. Continue wegovy .        Respiratory   OSA (obstructive sleep apnea)   Continue to follow with sleep medicine. Await sleep study. Call with any concerns.         Endocrine   Hypothyroidism   Rechecking labs today. Await results.  Treat as needed.       Relevant Orders   TSH     Musculoskeletal and Integument   Statin myopathy   Unable to tolerate statins. Will continue zetia  and repatha . Call with any concerns.         Genitourinary   Benign hypertensive renal disease   Under good control on current regimen. Continue current regimen. Continue to monitor. Call with any concerns. Refills given. Labs drawn today.          Other   Hyperlipidemia - Primary   Tolerating her repatha  well. Rechecking labs today. Await results.       Relevant Orders   Comprehensive metabolic panel with GFR   Lipid Panel w/o Chol/HDL  Ratio   Severe episode of recurrent major depressive disorder, without psychotic features (HCC)   Under good control on current regimen. Continue current regimen. Continue to monitor. Call with any concerns. Refills given.        Nicotine dependence, cigarettes, uncomplicated   Encouraged abstinence and continue patches. Call with any concerns.       Morbid obesity (HCC)   Congratulated patient on 14lb weight loss for 5.5% body weight. Tolerating wegovy  well. Continue to monitor. Continue titration- recheck 6 weeks.       Relevant Medications   semaglutide -weight management (WEGOVY ) 0.5 MG/0.5ML SOAJ SQ injection (Start on 05/02/2024)   semaglutide -weight management (WEGOVY ) 1 MG/0.5ML SOAJ SQ injection (Start on 05/30/2024)   Other Visit Diagnoses       Vitamin D  deficiency       Rechecking labs today. Await results.   Relevant Orders   VITAMIN D  25 Hydroxy (Vit-D Deficiency, Fractures)     History of anemia       Will check labs today. Await results.   Relevant Orders   CBC with Differential/Platelet   Ferritin   Iron  Binding Cap (TIBC)([facility])   B12     Hepatitis B vaccination status unknown       Will check labs today. Await results.   Relevant Orders   Hepatitis B surface antibody,quantitative     Encounter for screening mammogram for malignant neoplasm of breast       Mammogram ordered today.   Relevant Orders   MM 3D SCREENING MAMMOGRAM BILATERAL BREAST        Follow up plan: Return in about 6 weeks (around 05/23/2024).

## 2024-04-11 NOTE — Assessment & Plan Note (Signed)
 Under good control on current regimen. Continue current regimen. Continue to monitor. Call with any concerns. Refills given. Labs drawn today.

## 2024-04-11 NOTE — Assessment & Plan Note (Signed)
 Continue to follow with cardiology. Continue cardiac rehab. Will keep her BP and cholesterol under good control. Continue wegovy .

## 2024-04-12 LAB — CBC WITH DIFFERENTIAL/PLATELET
Basophils Absolute: 0 x10E3/uL (ref 0.0–0.2)
Basos: 1 %
EOS (ABSOLUTE): 0.2 x10E3/uL (ref 0.0–0.4)
Eos: 3 %
Hematocrit: 36.1 % (ref 34.0–46.6)
Hemoglobin: 11.7 g/dL (ref 11.1–15.9)
Immature Grans (Abs): 0 x10E3/uL (ref 0.0–0.1)
Immature Granulocytes: 0 %
Lymphocytes Absolute: 1.7 x10E3/uL (ref 0.7–3.1)
Lymphs: 33 %
MCH: 29.1 pg (ref 26.6–33.0)
MCHC: 32.4 g/dL (ref 31.5–35.7)
MCV: 90 fL (ref 79–97)
Monocytes Absolute: 0.3 x10E3/uL (ref 0.1–0.9)
Monocytes: 6 %
Neutrophils Absolute: 2.8 x10E3/uL (ref 1.4–7.0)
Neutrophils: 57 %
Platelets: 260 x10E3/uL (ref 150–450)
RBC: 4.02 x10E6/uL (ref 3.77–5.28)
RDW: 13.4 % (ref 11.7–15.4)
WBC: 5 x10E3/uL (ref 3.4–10.8)

## 2024-04-12 LAB — COMPREHENSIVE METABOLIC PANEL WITH GFR
ALT: 12 IU/L (ref 0–32)
AST: 16 IU/L (ref 0–40)
Albumin: 4.5 g/dL (ref 3.9–4.9)
Alkaline Phosphatase: 68 IU/L (ref 49–135)
BUN/Creatinine Ratio: 17 (ref 12–28)
BUN: 14 mg/dL (ref 8–27)
Bilirubin Total: 0.4 mg/dL (ref 0.0–1.2)
CO2: 16 mmol/L — ABNORMAL LOW (ref 20–29)
Calcium: 9.4 mg/dL (ref 8.7–10.3)
Chloride: 109 mmol/L — ABNORMAL HIGH (ref 96–106)
Creatinine, Ser: 0.84 mg/dL (ref 0.57–1.00)
Globulin, Total: 2 g/dL (ref 1.5–4.5)
Glucose: 89 mg/dL (ref 70–99)
Potassium: 4.1 mmol/L (ref 3.5–5.2)
Sodium: 145 mmol/L — ABNORMAL HIGH (ref 134–144)
Total Protein: 6.5 g/dL (ref 6.0–8.5)
eGFR: 79 mL/min/1.73 (ref >59)

## 2024-04-12 LAB — LIPID PANEL W/O CHOL/HDL RATIO
Cholesterol, Total: 186 mg/dL (ref 100–199)
HDL: 47 mg/dL (ref >39)
LDL Chol Calc (NIH): 108 mg/dL — ABNORMAL HIGH (ref 0–99)
Triglycerides: 179 mg/dL — ABNORMAL HIGH (ref 0–149)
VLDL Cholesterol Cal: 31 mg/dL (ref 5–40)

## 2024-04-12 LAB — VITAMIN D 25 HYDROXY (VIT D DEFICIENCY, FRACTURES): Vit D, 25-Hydroxy: 31.5 ng/mL (ref 30.0–100.0)

## 2024-04-12 LAB — TSH: TSH: 0.543 u[IU]/mL (ref 0.450–4.500)

## 2024-04-12 LAB — IRON AND TIBC
Iron Saturation: 16 % (ref 15–55)
Iron: 50 ug/dL (ref 27–139)
Total Iron Binding Capacity: 317 ug/dL (ref 250–450)
UIBC: 267 ug/dL (ref 118–369)

## 2024-04-12 LAB — FERRITIN: Ferritin: 18 ng/mL (ref 15–150)

## 2024-04-12 LAB — VITAMIN B12: Vitamin B-12: 1317 pg/mL — ABNORMAL HIGH (ref 232–1245)

## 2024-04-12 LAB — HEPATITIS B SURFACE ANTIBODY, QUANTITATIVE: Hepatitis B Surf Ab Quant: <3.5 m[IU]/mL — ABNORMAL LOW

## 2024-04-13 ENCOUNTER — Encounter: Admitting: Emergency Medicine

## 2024-04-13 ENCOUNTER — Ambulatory Visit: Payer: Self-pay | Admitting: Family Medicine

## 2024-04-13 DIAGNOSIS — Z955 Presence of coronary angioplasty implant and graft: Secondary | ICD-10-CM

## 2024-04-13 NOTE — Progress Notes (Signed)
 Daily Session Note  Patient Details  Name: Hannah Kaiser MRN: [id number] Date of Birth: 1961/03/29 Referring Provider:   Flowsheet Row Cardiac Rehab from 04/03/2024 in [facility] Card[facility] and [facility] Re[facility]  Referring Provider Dr. Denyse Bathe, MD    Encounter Date: 04/13/2024  Check In:  Session Check In - 04/13/24 1524       Check-In   Supervising physician immediately available to respond to emergencies See telemetry face sheet for immediately available ER MD    Location [facility]-Cardiac & Pulmonary Rehab    Staff Present Leita Franks RN,BSN;Joseph Rolinda NORWOOD HARMAN Verlie Laird, [place], Exercise Physiologist;Margaret Best, MS, Exercise Physiologist    Virtual Visit No    Medication changes reported     No    Fall or balance concerns reported    No    Tobacco Cessation No Change    Warm-up and Cool-down Performed on first and last piece of equipment    Resistance Training Performed Yes    VAD Patient? No    PAD/SET Patient? No      Pain Assessment   Currently in Pain? No/denies             Social History   Tobacco Use  Smoking Status Every Day   Current packs/day: 1.50   Average packs/day: 1.5 packs/day for 41.0 years (61.5 ttl pk-yrs)   Types: Cigarettes  Smokeless Tobacco Never  Tobacco Comments   since age 63    Goals Met:  Independence with exercise equipment Exercise tolerated well No report of concerns or symptoms today Strength training completed today  Goals Unmet:  Not Applicable  Comments: Pt able to follow exercise prescription today without complaint.  Will continue to monitor for progression.    Dr. Oneil Pinal is Medical Director for [facility] Cardiac Rehabilitation.  Dr. Fuad Aleskerov is Medical Director for [facility] Pulmonary Rehabilitation.

## 2024-04-17 ENCOUNTER — Encounter: Admitting: Emergency Medicine

## 2024-04-17 DIAGNOSIS — Z955 Presence of coronary angioplasty implant and graft: Secondary | ICD-10-CM | POA: Diagnosis not present

## 2024-04-17 NOTE — Progress Notes (Signed)
 Daily Session Note  Patient Details  Name: Hannah Kaiser MRN: [id number] Date of Birth: Mar 15, 1961 Referring Provider:   Flowsheet Row Cardiac Rehab from 04/03/2024 in [facility] Cardiac and Pulmonary Rehab  Referring Provider Dr. Denyse Bathe, MD    Encounter Date: 04/17/2024  Check In:  Session Check In - 04/17/24 1540       Check-In   Supervising physician immediately available to respond to emergencies See telemetry face sheet for immediately available ER MD    Location [facility]    Staff Present Maxon Conetta BS, Exercise Physiologist;Joseph [facility] RN,BSN;Margaret Best, MS, Exercise Physiologist    Virtual Visit No    Medication changes reported     No    Fall or balance concerns reported    No    Tobacco Cessation No Change    Warm-up and Cool-down Performed on first and last piece of equipment    Resistance Training Performed Yes    VAD Patient? No    PAD/SET Patient? No      Pain Assessment   Currently in Pain? No/denies             Social History   Tobacco Use  Smoking Status Every Day   Current packs/day: 1.50   Average packs/day: 1.5 packs/day for 41.0 years (61.5 ttl pk-yrs)   Types: Cigarettes  Smokeless Tobacco Never  Tobacco Comments   since age 3    Goals Met:  Independence with exercise equipment Exercise tolerated well No report of concerns or symptoms today Strength training completed today  Goals Unmet:  Not Applicable  Comments: Pt able to follow exercise prescription today without complaint.  Will continue to monitor for progression.    Dr. Oneil Pinal is Medical Director for [facility] Cardiac Rehabilitation.  Dr. Fuad Aleskerov is Medical Director for [facility] Pulmonary Rehabilitation.

## 2024-04-20 ENCOUNTER — Encounter: Admitting: *Deleted

## 2024-04-20 DIAGNOSIS — Z955 Presence of coronary angioplasty implant and graft: Secondary | ICD-10-CM

## 2024-04-20 NOTE — Progress Notes (Signed)
 Daily Session Note  Patient Details  Name: LILEIGH FAHRINGER MRN: [id number] Date of Birth: 12-25-1960 Referring Provider:   Flowsheet Row Cardiac Rehab from 04/03/2024 in [facility] Card[facility] Re[facility]  Referring Provider Dr. Denyse Bathe, MD    Encounter Date: 04/20/2024  Check In:  Session Check In - 04/20/24 1538       Check-In   Supervising physician immediately available to respond to emergencies See telemetry face sheet for immediately available ER MD    Location [facility]Cardiac [facility] Pul[facility] Re[facility]    Staff Present Hoy Rodney RN,BSN;Margaret Best, MS, Exercise Physiologist;Joseph Hood RCP,RRT,BSRT;Noah Tickle, BS, Exercise Physiologist    Virtual Visit No    Medication changes reported     No    Fall or balance concerns reported    No    Warm-up and Cool-down Performed on first and last piece of equipment    Resistance Training Performed Yes    VAD Patient? No    PAD/SET Patient? No      Pain Assessment   Currently in Pain? No/denies             Social History   Tobacco Use  Smoking Status Every Day   Current packs/day: 1.50   Average packs/day: 1.5 packs/day for 41.0 years (61.5 ttl pk-yrs)   Types: Cigarettes  Smokeless Tobacco Never  Tobacco Comments   since age 20    Goals Met:  Independence with exercise equipment Exercise tolerated well No report of concerns or symptoms today Strength training completed today  Goals Unmet:  Not Applicable  Comments: Pt able to follow exercise prescription today without complaint.  Will continue to monitor for progression.    Dr. Oneil Pinal is Medical Director for [facility] Cardiac Rehabilitation.  Dr. Fuad Aleskerov is Medical Director for [facility] Pulmonary Rehabilitation.

## 2024-04-24 ENCOUNTER — Encounter

## 2024-04-26 DIAGNOSIS — Z955 Presence of coronary angioplasty implant and graft: Secondary | ICD-10-CM

## 2024-04-26 NOTE — Progress Notes (Signed)
 Cardiac Individual Treatment Plan  Patient Details  Name: Hannah Kaiser MRN: [id number] Date of Birth: February 27, 1961 Referring Provider:   Flowsheet Row Cardiac Rehab from 04/03/2024 in [facility] Cardiac and Pulmonary Rehab  Referring Provider Dr. Denyse Bathe, MD    Initial Encounter Date:  Flowsheet Row Cardiac Rehab from 04/03/2024 in [facility] Cardiac and Pulmonary Rehab  Date 04/03/24    Visit Diagnosis: Status post coronary artery stent placement  Patient's Home Medications on Admission:  Current Outpatient Medications:    albuterol  (VENTOLIN  HFA) 108 (90 Base) MCG/ACT inhaler, Inhale 1-2 puffs into the lungs every 6 (six) hours as needed for wheezing or shortness of breath., Disp: 18 each, Rfl: 1   aluminum-magnesium hydroxide-simethicone  (MAALOX) 200-200-20 MG/5ML SUSP, Take 30 mLs by mouth 4 (four) times daily -  before meals and at bedtime., Disp: 1680 mL, Rfl: 0   amLODipine  (NORVASC ) 10 MG tablet, Take 1 tablet (10 mg total) by mouth daily., Disp: 90 tablet, Rfl: 1   aspirin  EC 81 MG tablet, Take 1 tablet (81 mg total) by mouth daily. Swallow whole., Disp: 30 tablet, Rfl: 12   atenolol  (TENORMIN ) 50 MG tablet, Take 1 tablet (50 mg total) by mouth daily., Disp: 90 tablet, Rfl: 1   budesonide -glycopyrrolate -formoterol  (BREZTRI  AEROSPHERE) 160-9-4.8 MCG/ACT AERO inhaler, Inhale 2 puffs into the lungs 2 (two) times daily., Disp: , Rfl:    busPIRone  (BUSPAR ) 15 MG tablet, Take 1 tablet (15 mg total) by mouth 3 (three) times daily., Disp: 270 tablet, Rfl: 1   clonazePAM  (KLONOPIN ) 1 MG tablet, Take 1 tablet (1 mg total) by mouth 2 (two) times daily as needed. for anxiety, Disp: 60 tablet, Rfl: 0   Evolocumab  (REPATHA  SURECLICK) 140 MG/ML SOAJ, Inject 140 mg into the skin every 14 (fourteen) days., Disp: 2 mL, Rfl: 2   ezetimibe  (ZETIA ) 10 MG tablet, Take 1 tablet (10 mg total) by mouth daily., Disp: 90 tablet, Rfl: 1   FLUoxetine  (PROZAC ) 40 MG capsule, Take 2 capsules (80 mg total) by mouth  daily., Disp: 180 capsule, Rfl: 1   furosemide  (LASIX ) 20 MG tablet, TAKE 1 TABLET BY MOUTH ONCE DAILY AS NEEDED FOR SWELLING, Disp: 60 tablet, Rfl: 3   hydrOXYzine  (ATARAX ) 25 MG tablet, Take 1 tablet (25 mg total) by mouth 3 (three) times daily as needed., Disp: 270 tablet, Rfl: 1   isosorbide  mononitrate (IMDUR ) 60 MG 24 hr tablet, Take 1 tablet (60 mg total) by mouth daily., Disp: 90 tablet, Rfl: 1   losartan  (COZAAR ) 100 MG tablet, Take 1 tablet (100 mg total) by mouth daily., Disp: 90 tablet, Rfl: 1   mupirocin  ointment (BACTROBAN ) 2 %, Apply 1 application. topically 2 (two) times daily. (Patient taking differently: Apply 1 application  topically daily as needed (Breakouts).), Disp: 22 g, Rfl: 0   nicotine (NICODERM CQ - DOSED IN MG/24 HOURS) 21 mg/24hr patch, Place 1 patch (21 mg total) onto the skin daily., Disp: 28 patch, Rfl: 1   nystatin -triamcinolone  ointment (MYCOLOG), Apply 1 application topically 2 (two) times daily. (Patient taking differently: Apply 1 application  topically 2 (two) times daily as needed (sores that do not heal).), Disp: 30 g, Rfl: 1   ondansetron  (ZOFRAN ) 4 MG tablet, Take 1 tablet (4 mg total) by mouth every 8 (eight) hours as needed for nausea or vomiting., Disp: 60 tablet, Rfl: 3   pantoprazole  (PROTONIX ) 40 MG tablet, Take 1 tablet (40 mg total) by mouth 2 (two) times daily., Disp: 180 tablet, Rfl: 1  prasugrel (EFFIENT) 10 MG TABS tablet, Take 1 tablet (10 mg total) by mouth daily., Disp: 30 tablet, Rfl: 0   QUEtiapine  Fumarate (SEROQUEL  XR) 150 MG 24 hr tablet, Take 1 tablet (150 mg total) by mouth at bedtime., Disp: 90 tablet, Rfl: 0   ranolazine  (RANEXA ) 500 MG 12 hr tablet, TAKE 1 TABLET BY MOUTH TWICE A DAY, Disp: 180 tablet, Rfl: 0   semaglutide -weight management (WEGOVY ) 0.25 MG/0.5ML SOAJ SQ injection, Inject 0.25 mg into the skin once a week., Disp: 2 mL, Rfl: 0   [START ON 05/02/2024] semaglutide -weight management (WEGOVY ) 0.5 MG/0.5ML SOAJ SQ  injection, Inject 0.5 mg into the skin once a week., Disp: 2 mL, Rfl: 1   [START ON 05/30/2024] semaglutide -weight management (WEGOVY ) 1 MG/0.5ML SOAJ SQ injection, Inject 1 mg into the skin once a week., Disp: 2 mL, Rfl: 1   tiZANidine  (ZANAFLEX ) 4 MG tablet, Take 1 tablet (4 mg total) by mouth every 8 (eight) hours as needed for muscle spasms., Disp: 90 tablet, Rfl: 1   triamcinolone  ointment (KENALOG ) 0.5 %, Apply 1 application topically 2 (two) times daily. (Patient taking differently: Apply 1 application  topically daily as needed (Breakout).), Disp: 30 g, Rfl: 0   valACYclovir  (VALTREX ) 1000 MG tablet, Take 1 tablet (1,000 mg total) by mouth daily. (Patient taking differently: Take 1,000 mg by mouth daily as needed (Breakout).), Disp: 180 tablet, Rfl: 1   Vitamin D , Ergocalciferol , (DRISDOL ) 1.25 MG (50000 UNIT) CAPS capsule, TAKE 1 CAPSULE BY MOUTH ONE TIME PER WEEK, Disp: 12 capsule, Rfl: 0  Past Medical History: Past Medical History:  Diagnosis Date   Anemia    Anxiety    Arthritis    knees, thoracic spurs and arthritis   Asthma    Breast pain    CHF (congestive heart failure) (HCC)    Colon polyps    COPD (chronic obstructive pulmonary disease) (HCC)    Depression    Dysrhythmia    GERD (gastroesophageal reflux disease)    Glaucoma    Heart murmur    History of blood transfusion    Hypertension    Motion sickness    cars   Multilevel degenerative disc disease    Neuropathy    Bilateral arms and legs   Nipple discharge    Numbness of both lower extremities    bulging lumbar discs - per pt   Numbness of upper extremity    bilateral - degenerative cervical discs - per pt   Pneumonia    PONV (postoperative nausea and vomiting)    Sleep apnea    has CPAP   Smokers' cough (HCC)    Swelling of limb 03/11/2021   Wears dentures    full upper and lower    Tobacco Use: Social History   Tobacco Use  Smoking Status Every Day   Current packs/day: 1.50   Average  packs/day: 1.5 packs/day for 41.0 years (61.5 ttl pk-yrs)   Types: Cigarettes  Smokeless Tobacco Never  Tobacco Comments   since age 89    Labs: Review Flowsheet  More data exists      Latest Ref Rng & Units 10/06/2022 05/17/2023 10/19/2023 12/27/2023 04/11/2024  Labs for ITP Cardiac and Pulmonary Rehab  Cholestrol 100 - 199 mg/dL 836  851  772  - 813   LDL (calc) 0 - 99 mg/dL 88  70  849  - 891   HDL-C >39 mg/dL 53  54  53  - 47   Trlycerides 0 -  149 mg/dL 873  860  864  - 820   Hemoglobin A1c 4.8 - 5.6 % - 5.7  5.4  5.6  -     Exercise Target Goals: Exercise Program Goal: Individual exercise prescription set using results from initial 6 min walk test and THRR while considering  patient's activity barriers and safety.   Exercise Prescription Goal: Initial exercise prescription builds to 30-45 minutes a day of aerobic activity, 2-3 days per week.  Home exercise guidelines will be given to patient during program as part of exercise prescription that the participant will acknowledge.   Education: Aerobic Exercise: - Group verbal and visual presentation on the components of exercise prescription. Introduces F.I.T.T principle from ACSM for exercise prescriptions.  Reviews F.I.T.T. principles of aerobic exercise including progression. Written material provided at class time.   Education: Resistance Exercise: - Group verbal and visual presentation on the components of exercise prescription. Introduces F.I.T.T principle from ACSM for exercise prescriptions  Reviews F.I.T.T. principles of resistance exercise including progression. Written material provided at class time.    Education: Exercise & Equipment Safety: - Individual verbal instruction and demonstration of equipment use and safety with use of the equipment.   Education: Exercise Physiology & General Exercise Guidelines: - Group verbal and written instruction with models to review the exercise physiology of the cardiovascular  system and associated critical values. Provides general exercise guidelines with specific guidelines to those with heart or lung disease. Written material provided at class time.   Education: Flexibility, Balance, Mind/Body Relaxation: - Group verbal and visual presentation with interactive activity on the components of exercise prescription. Introduces F.I.T.T principle from ACSM for exercise prescriptions. Reviews F.I.T.T. principles of flexibility and balance exercise training including progression. Also discusses the mind body connection.  Reviews various relaxation techniques to help reduce and manage stress (i.e. Deep breathing, progressive muscle relaxation, and visualization). Balance handout provided to take home. Written material provided at class time.   Activity Barriers & Risk Stratification:  Activity Barriers & Cardiac Risk Stratification - 04/03/24 1403       Activity Barriers & Cardiac Risk Stratification   Activity Barriers Arthritis;Back Problems;Neck/Spine Problems;Balance Concerns;Shortness of Breath;Deconditioning    Cardiac Risk Stratification High          6 Minute Walk:  6 Minute Walk     Row Name 04/03/24 1402         6 Minute Walk   Phase Initial     Distance 975 feet     Walk Time 6 minutes     # of Rest Breaks 0     MPH 1.85     METS 1.9     RPE 11     Perceived Dyspnea  1     VO2 Peak 6.65     Symptoms Yes (comment)     Comments chest discomfort     Resting HR 55 bpm     Resting BP 126/72     Resting Oxygen Saturation  96 %     Exercise Oxygen Saturation  during 6 min walk 99 %     Max Ex. HR 78 bpm     Max Ex. BP 140/78     2 Minute Post BP 122/72        Oxygen Initial Assessment:   Oxygen Re-Evaluation:   Oxygen Discharge (Final Oxygen Re-Evaluation):   Initial Exercise Prescription:  Initial Exercise Prescription - 04/03/24 1400       Date of Initial Exercise RX and Referring  Provider   Date 04/03/24    Referring Provider  Dr. Denyse Bathe, MD      Oxygen   Maintain Oxygen Saturation 88% or higher      Treadmill   MPH 1.6    Grade 0    Minutes 15    METs 2.23      Recumbant Bike   Level 1    RPM 50    Watts 15    Minutes 15    METs 1.9      NuStep   Level 2    SPM 80    Minutes 15    METs 1.9      Prescription Details   Frequency (times per week) 2    Duration Progress to 30 minutes of continuous aerobic without signs/symptoms of physical distress      Intensity   THRR 40-80% of Max Heartrate 96-138    Ratings of Perceived Exertion 11-13    Perceived Dyspnea 0-4      Progression   Progression Continue to progress workloads to maintain intensity without signs/symptoms of physical distress.      Resistance Training   Training Prescription Yes    Weight 3 lb    Reps 10-15          Perform Capillary Blood Glucose checks as needed.  Exercise Prescription Changes:   Exercise Prescription Changes     Row Name 04/03/24 1400 04/18/24 0800           Response to Exercise   Blood Pressure (Admit) 126/72 106/58      Blood Pressure (Exercise) 140/78 118/60      Blood Pressure (Exit) 122/72 104/62      Heart Rate (Admit) 55 bpm 62 bpm      Heart Rate (Exercise) 78 bpm 114 bpm      Heart Rate (Exit) 59 bpm 62 bpm      Oxygen Saturation (Admit) 96 % --      Oxygen Saturation (Exercise) 99 % --      Rating of Perceived Exertion (Exercise) 11 14      Perceived Dyspnea (Exercise) 1 0      Symptoms chest discomfort none      Comments Results first 2 weeks of exercise      Duration -- Progress to 30 minutes of  aerobic without signs/symptoms of physical distress      Intensity -- THRR unchanged        Progression   Progression -- Continue to progress workloads to maintain intensity without signs/symptoms of physical distress.      Average METs -- 2.5        Resistance Training   Training Prescription -- Yes      Weight -- 3lb      Reps -- 10-15        Interval Training    Interval Training -- No        Treadmill   MPH -- 2      Grade -- 0      Minutes -- 15      METs -- 2.53        NuStep   Level -- 2      Minutes -- 15      METs -- 2.1        REL-XR   Level -- 1      Minutes -- 15      METs -- 3.2        Oxygen  Maintain Oxygen Saturation -- 88% or higher         Exercise Comments:   Exercise Comments     Row Name 04/10/24 1540           Exercise Comments First full day of exercise!  Patient was oriented to gym and equipment including functions, settings, policies, and procedures.  Patient's individual exercise prescription and treatment plan were reviewed.  All starting workloads were established based on the results of the 6 minute walk test done at initial orientation visit.  The plan for exercise progression was also introduced and progression will be customized based on patient's performance and goals.          Exercise Goals and Review:   Exercise Goals     Row Name 04/03/24 1151             Exercise Goals   Increase Physical Activity Yes       Intervention Provide advice, education, support and counseling about physical activity/exercise needs.;Develop an individualized exercise prescription for aerobic and resistive training based on initial evaluation findings, risk stratification, comorbidities and participant's personal goals.       Expected Outcomes Short Term: Attend rehab on a regular basis to increase amount of physical activity.;Long Term: Add in home exercise to make exercise part of routine and to increase amount of physical activity.;Long Term: Exercising regularly at least 3-5 days a week.       Increase Strength and Stamina Yes       Intervention Provide advice, education, support and counseling about physical activity/exercise needs.;Develop an individualized exercise prescription for aerobic and resistive training based on initial evaluation findings, risk stratification, comorbidities and participant's  personal goals.       Expected Outcomes Short Term: Increase workloads from initial exercise prescription for resistance, speed, and METs.;Short Term: Perform resistance training exercises routinely during rehab and add in resistance training at home;Long Term: Improve cardiorespiratory fitness, muscular endurance and strength as measured by increased METs and functional capacity ( )       Able to understand and use rate of perceived exertion (RPE) scale Yes       Intervention Provide education and explanation on how to use RPE scale       Expected Outcomes Short Term: Able to use RPE daily in rehab to express subjective intensity level;Long Term:  Able to use RPE to guide intensity level when exercising independently       Able to understand and use Dyspnea scale Yes       Intervention Provide education and explanation on how to use Dyspnea scale       Expected Outcomes Short Term: Able to use Dyspnea scale daily in rehab to express subjective sense of shortness of breath during exertion;Long Term: Able to use Dyspnea scale to guide intensity level when exercising independently       Knowledge and understanding of Target Heart Rate Range (THRR) Yes       Intervention Provide education and explanation of THRR including how the numbers were predicted and where they are located for reference       Expected Outcomes Short Term: Able to state/look up THRR;Long Term: Able to use THRR to govern intensity when exercising independently;Short Term: Able to use daily as guideline for intensity in rehab       Able to check pulse independently Yes       Intervention Provide education and demonstration on how to check pulse in carotid and radial arteries.;Review  the importance of being able to check your own pulse for safety during independent exercise       Expected Outcomes Short Term: Able to explain why pulse checking is important during independent exercise;Long Term: Able to check pulse independently and  accurately       Understanding of Exercise Prescription Yes       Intervention Provide education, explanation, and written materials on patient's individual exercise prescription       Expected Outcomes Short Term: Able to explain program exercise prescription;Long Term: Able to explain home exercise prescription to exercise independently          Exercise Goals Re-Evaluation :  Exercise Goals Re-Evaluation     Row Name 04/10/24 1540 04/18/24 0846           Exercise Goal Re-Evaluation   Exercise Goals Review Increase Physical Activity;Able to understand and use rate of perceived exertion (RPE) scale;Knowledge and understanding of Target Heart Rate Range (THRR);Understanding of Exercise Prescription;Increase Strength and Stamina;Able to understand and use Dyspnea scale;Able to check pulse independently Increase Physical Activity;Increase Strength and Stamina;Understanding of Exercise Prescription      Comments Reviewed RPE and dyspnea scale, THR and program prescription with pt today.  Pt voiced understanding and was given a copy of goals to take home. Aurianna is off to a good start in the program, and was able to attend her first 2 sessions during this review period. During those sessions she was able to use the treadmill at a speed of and no incline, and use the T4 nustep at level 2. We will continue to monitor her progress in the program.      Expected Outcomes Short: Use RPE daily to regulate intensity.  Long: Follow program prescription in THR. Short: Continue to follow exercise prescription. Long: Continue exercise to improve strength and stamina.         Discharge Exercise Prescription (Final Exercise Prescription Changes):  Exercise Prescription Changes - 04/18/24 0800       Response to Exercise   Blood Pressure (Admit) 106/58    Blood Pressure (Exercise) 118/60    Blood Pressure (Exit) 104/62    Heart Rate (Admit) 62 bpm    Heart Rate (Exercise) 114 bpm    Heart Rate (Exit)  62 bpm    Rating of Perceived Exertion (Exercise) 14    Perceived Dyspnea (Exercise) 0    Symptoms none    Comments first 2 weeks of exercise    Duration Progress to 30 minutes of  aerobic without signs/symptoms of physical distress    Intensity THRR unchanged      Progression   Progression Continue to progress workloads to maintain intensity without signs/symptoms of physical distress.    Average METs 2.5      Resistance Training   Training Prescription Yes    Weight 3lb    Reps 10-15      Interval Training   Interval Training No      Treadmill   MPH 2    Grade 0    Minutes 15    METs 2.53      NuStep   Level 2    Minutes 15    METs 2.1      REL-XR   Level 1    Minutes 15    METs 3.2      Oxygen   Maintain Oxygen Saturation 88% or higher          Nutrition:  Target Goals: Understanding of  nutrition guidelines, daily intake of sodium 1500mg , cholesterol 200mg , calories 30% from fat and 7% or less from saturated fats, daily to have 5 or more servings of fruits and vegetables.  Education: Nutrition 1 -Group instruction provided by verbal, written material, interactive activities, discussions, models, and posters to present general guidelines for heart healthy nutrition including macronutrients, label reading, and promoting whole foods over processed counterparts. Education serves as pensions consultant of discussion of heart healthy eating for all. Written material provided at class time.    Education: Nutrition 2 -Group instruction provided by verbal, written material, interactive activities, discussions, models, and posters to present general guidelines for heart healthy nutrition including sodium, cholesterol, and saturated fat. Providing guidance of habit forming to improve blood pressure, cholesterol, and body weight. Written material provided at class time.     Biometrics:  Pre Biometrics - 04/03/24 1153       Pre Biometrics   Height 5' 4.5 (1.638 m)     Weight 241 lb 3.2 oz (109.4 kg)    Waist Circumference 44.5 inches    Hip Circumference 50 inches    Waist to Hip Ratio 0.89 %    BMI (Calculated) 40.78    Single Leg Stand 3.33 seconds           Nutrition Therapy Plan and Nutrition Goals:  Nutrition Therapy & Goals - 04/03/24 1337       Nutrition Therapy   Diet Cardiac, Low Na    Protein (specify units) 80    Fiber 25 grams    Whole Grain Foods 3 servings    Saturated Fats 15 max. grams    Fruits and Vegetables 5 servings/day    Sodium 2 grams      Personal Nutrition Goals   Nutrition Goal Eat 3 times per day, small frequent meals or nutrient dense snacks    Personal Goal #2 Read labels and reduce sodium intake to below 2300mg . Ideally 1500mg  per day.    Personal Goal #3 Reduce saturated fat, less than 12g per day. Replace bad fats for more heart healthy fats.    Comments Patient drinking mostly fruit juice with some water . She knows the juice is not good for her, set goal to work on cutting back on juice and increasing water  intake. She is on wegovy  and reports she often misses lunch. Spoke with her about eating 3 times per day, using nutrient dense snacks. Brainstormed several small and snack ideas. Reviewed mediterranean diet handout. Educated on types of fats, sources and how to read labels. Provided guideline limits of less than 1500mg  sodium and less than 12g saturated fat per day      Intervention Plan   Intervention Prescribe, educate and counsel regarding individualized specific dietary modifications aiming towards targeted core components such as weight, hypertension, lipid management, diabetes, heart failure and other comorbidities.;Nutrition handout(s) given to patient.    Expected Outcomes Short Term Goal: Understand basic principles of dietary content, such as calories, fat, sodium, cholesterol and nutrients.;Short Term Goal: A plan has been developed with personal nutrition goals set during dietitian appointment.;Long  Term Goal: Adherence to prescribed nutrition plan.          Nutrition Assessments:  MEDIFICTS Score Key: >=70 Need to make dietary changes  40-70 Heart Healthy Diet <= 40 Therapeutic Level Cholesterol Diet  Flowsheet Row Cardiac Rehab from 04/10/2024 in [facility] Cardiac and Pulmonary Rehab  Picture Your Plate Total Score on Admission 38   Picture Your Plate Scores: <59 Unhealthy dietary pattern  with much room for improvement. 41-50 Dietary pattern unlikely to meet recommendations for good health and room for improvement. 51-60 More healthful dietary pattern, with some room for improvement.  >60 Healthy dietary pattern, although there may be some specific behaviors that could be improved.    Nutrition Goals Re-Evaluation:  Nutrition Goals Re-Evaluation     Row Name 04/20/24 1550             Goals   Comment Jarvis recently spoke with the RD about how she can improve her nutrition. She has began to drink more water  throughout the day. She also has been working on reducing her sodium intake by not adding salt at the table. She also states that she has been paying more attention to food labels.       Expected Outcome Short: Continue to reduce sodium intake. Long: Continue to practice heart healthy eating patterns as discussed with RD.          Nutrition Goals Discharge (Final Nutrition Goals Re-Evaluation):  Nutrition Goals Re-Evaluation - 04/20/24 1550       Goals   Comment Ivey recently spoke with the RD about how she can improve her nutrition. She has began to drink more water  throughout the day. She also has been working on reducing her sodium intake by not adding salt at the table. She also states that she has been paying more attention to food labels.    Expected Outcome Short: Continue to reduce sodium intake. Long: Continue to practice heart healthy eating patterns as discussed with RD.          Psychosocial: Target Goals: Acknowledge presence or absence of  significant depression and/or stress, maximize coping skills, provide positive support system. Participant is able to verbalize types and ability to use techniques and skills needed for reducing stress and depression.   Education: Stress, Anxiety, and Depression - Group verbal and visual presentation to define topics covered.  Reviews how body is impacted by stress, anxiety, and depression.  Also discusses healthy ways to reduce stress and to treat/manage anxiety and depression. Written material provided at class time.   Education: Sleep Hygiene -Provides group verbal and written instruction about how sleep can affect your health.  Define sleep hygiene, discuss sleep cycles and impact of sleep habits. Review good sleep hygiene tips.   Initial Review & Psychosocial Screening:  Initial Psych Review & Screening - 04/03/24 0948       Initial Review   Current issues with Current Stress Concerns    Source of Stress Concerns Family;Chronic Illness      Family Dynamics   Good Support System? Yes      Barriers   Psychosocial barriers to participate in program There are no identifiable barriers or psychosocial needs.;The patient should benefit from training in stress management and relaxation.      Screening Interventions   Interventions Encouraged to exercise;Provide feedback about the scores to participant;To provide support and resources with identified psychosocial needs    Expected Outcomes Short Term goal: Utilizing psychosocial counselor, staff and physician to assist with identification of specific Stressors or current issues interfering with healing process. Setting desired goal for each stressor or current issue identified.;Long Term Goal: Stressors or current issues are controlled or eliminated.;Short Term goal: Identification and review with participant of any Quality of Life or Depression concerns found by scoring the questionnaire.;Long Term goal: The participant improves quality of  Life and PHQ9 Scores as seen by post scores and/or verbalization of changes  Quality of Life Scores:   Quality of Life - 04/03/24 1143       Quality of Life   Select Quality of Life      Quality of Life Scores   Health/Function Pre 21.1 %    Socioeconomic Pre 26.94 %    Psych/Spiritual Pre 24.86 %    Family Pre 17.7 %    GLOBAL Pre 22.7 %         Scores of 19 and below usually indicate a poorer quality of life in these areas.  A difference of  2-3 points is a clinically meaningful difference.  A difference of 2-3 points in the total score of the Quality of Life Index has been associated with significant improvement in overall quality of life, self-image, physical symptoms, and general health in studies assessing change in quality of life.  PHQ-9: Review Flowsheet  More data exists      04/20/2024 04/11/2024 04/03/2024 01/27/2024 11/30/2023  Depression screen PHQ 2/9  Decreased Interest 1 2 1 1 1   Down, Depressed, Hopeless 0 1 0 0 1  PHQ - 2 Score 1 3 1 1 2   Altered sleeping 3 1 1 1  0  Tired, decreased energy 3 3 2 2 2   Change in appetite 0 1 1 2 1   Feeling bad or failure about yourself  0 0 0 0 0  Trouble concentrating 1 1 3 2 1   Moving slowly or fidgety/restless 0 0 1 0 0  Suicidal thoughts 0 0 0 0 0  PHQ-9 Score 8 9  9  8  6    Difficult doing work/chores Somewhat difficult Somewhat difficult Somewhat difficult Somewhat difficult Somewhat difficult    Details       Data saved with a previous flowsheet row definition        Interpretation of Total Score  Total Score Depression Severity:  1-4 = Minimal depression, 5-9 = Mild depression, 10-14 = Moderate depression, 15-19 = Moderately severe depression, 20-27 = Severe depression   Psychosocial Evaluation and Intervention:  Psychosocial Evaluation - 04/03/24 1014       Psychosocial Evaluation & Interventions   Comments Ms. Taylor is coming to cardiac rehab post stents. She is still having some chest  discomfort at times and feels more tired than before her stent. She is in contact with her doctor and thinks it may be some of the medicaiton she is on. She is hopeful that becoming more active will help with her symptoms some. She is interested in quitting smoking, but it is her destressor so she wants to find something that will help with her stress. 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 She helps her sister and mentions it is a high stress environment. She states she knows she needs to focus on her own health after all this time of helping others, and is ready to do so. Resources given for local mental health counselors. She works full time as a producer, television/film/video. She is ready to commit to the program    Expected Outcomes Short: attend cardiac rehab for education and exercise Long: develop and maintain positive self care habits    Continue Psychosocial Services  Follow up required by staff          Psychosocial Re-Evaluation:  Psychosocial Re-Evaluation     Row Name 04/20/24 1554  Psychosocial Re-Evaluation   Current issues with Current Stress Concerns;Current Sleep Concerns       Comments We reviewed PHQ-9 questionairre with pt today as her previous scores had shown signs of depression. Her score improved slightly, but still shows symptoms of depression. She reports that her main stressor is her lack of sleep as she has a hard time falling asleep and wakes up throughout the night. Her sleep concerns have caused her to have less energy and she has felt fatigued. She has spoken with her doctor about her sleep concerns, and will go for a sleep study later this month.       Expected Outcomes Short: Participate in sleep study. Long: Continue to attend rehab for mental boost.       Interventions Encouraged to attend Cardiac Rehabilitation for the exercise       Continue  Psychosocial Services  Follow up required by staff         Initial Review   Comments Sleep Concerns are her major stressor          Psychosocial Discharge (Final Psychosocial Re-Evaluation):  Psychosocial Re-Evaluation - 04/20/24 1554       Psychosocial Re-Evaluation   Current issues with Current Stress Concerns;Current Sleep Concerns    Comments We reviewed PHQ-9 questionairre with pt today as her previous scores had shown signs of depression. Her score improved slightly, but still shows symptoms of depression. She reports that her main stressor is her lack of sleep as she has a hard time falling asleep and wakes up throughout the night. Her sleep concerns have caused her to have less energy and she has felt fatigued. She has spoken with her doctor about her sleep concerns, and will go for a sleep study later this month.    Expected Outcomes Short: Participate in sleep study. Long: Continue to attend rehab for mental boost.    Interventions Encouraged to attend Cardiac Rehabilitation for the exercise    Continue Psychosocial Services  Follow up required by staff      Initial Review   Comments Sleep Concerns are her major stressor          Vocational Rehabilitation: Provide vocational rehab assistance to qualifying candidates.   Vocational Rehab Evaluation & Intervention:  Vocational Rehab - 04/03/24 0947       Initial Vocational Rehab Evaluation & Intervention   Assessment shows need for Vocational Rehabilitation No          Education: Education Goals: Education classes will be provided on a variety of topics geared toward better understanding of heart health and risk factor modification. Participant will state understanding/return demonstration of topics presented as noted by education test scores.  Learning Barriers/Preferences:  Learning Barriers/Preferences - 04/03/24 0947       Learning Barriers/Preferences   Learning Barriers None    Learning Preferences  Individual Instruction;Skilled Demonstration;Video          General Cardiac Education Topics:  AED/CPR: - Group verbal and written instruction with the use of models to demonstrate the basic use of the AED with the basic ABC's of resuscitation.   Test and Procedures: - Group verbal and visual presentation and models provide information about basic cardiac anatomy and function. Reviews the testing methods done to diagnose heart disease and the outcomes of the test results. Describes the treatment choices: Medical Management, Angioplasty, or Coronary Bypass Surgery for treating various heart conditions including Myocardial Infarction, Angina, Valve Disease, and Cardiac Arrhythmias. Written material provided at class time.  Medication Safety: - Group verbal and visual instruction to review commonly prescribed medications for heart and lung disease. Reviews the medication, class of the drug, and side effects. Includes the steps to properly store meds and maintain the prescription regimen. Written material provided at class time.   Intimacy: - Group verbal instruction through game format to discuss how heart and lung disease can affect sexual intimacy. Written material provided at class time.   Know Your Numbers and Heart Failure: - Group verbal and visual instruction to discuss disease risk factors for cardiac and pulmonary disease and treatment options.  Reviews associated critical values for Overweight/Obesity, Hypertension, Cholesterol, and Diabetes.  Discusses basics of heart failure: signs/symptoms and treatments.  Introduces Heart Failure Zone chart for action plan for heart failure. Written material provided at class time.   Infection Prevention: - Provides verbal and written material to individual with discussion of infection control including proper hand washing and proper equipment cleaning during exercise session.   Falls Prevention: - Provides verbal and written material to  individual with discussion of falls prevention and safety.   Other: -Provides group and verbal instruction on various topics (see comments)   Knowledge Questionnaire Score:  Knowledge Questionnaire Score - 04/03/24 1143       Knowledge Questionnaire Score   Pre Score 22/26          Core Components/Risk Factors/Patient Goals at Admission:  Personal Goals and Risk Factors at Admission - 04/03/24 0943       Core Components/Risk Factors/Patient Goals on Admission    Weight Management Yes;Weight Loss    Intervention Weight Management: Develop a combined nutrition and exercise program designed to reach desired caloric intake, while maintaining appropriate intake of nutrient and fiber, sodium and fats, and appropriate energy expenditure required for the weight goal.;Weight Management: Provide education and appropriate resources to help participant work on and attain dietary goals.;Weight Management/Obesity: Establish reasonable short term and long term weight goals.;Obesity: Provide education and appropriate resources to help participant work on and attain dietary goals.    Goal Weight: Long Term 160 lb (72.6 kg)    Expected Outcomes Short Term: Continue to assess and modify interventions until short term weight is achieved;Weight Loss: Understanding of general recommendations for a balanced deficit meal plan, which promotes 1-2 lb weight loss per week and includes a negative energy balance of [phone] kcal/d;Understanding recommendations for meals to include 15-35% energy as protein, 25-35% energy from fat, 35-60% energy from carbohydrates, less than 200mg  of dietary cholesterol, 20-35 gm of total fiber daily;Understanding of distribution of calorie intake throughout the day with the consumption of 4-5 meals/snacks    Tobacco Cessation Yes    Number of packs per day 1 ppd    Intervention Offer self-teaching materials, assist with locating and accessing local/national Quit Smoking programs, and  support quit date choice.;Assist the participant in steps to quit. Provide individualized education and counseling about committing to Tobacco Cessation, relapse prevention, and pharmacological support that can be provided by physician.    Expected Outcomes Short Term: Will demonstrate readiness to quit, by selecting a quit date.;Long Term: Complete abstinence from all tobacco products for at least 12 months from quit date.;Short Term: Will quit all tobacco product use, adhering to prevention of relapse plan.    Hypertension Yes    Intervention Provide education on lifestyle modifcations including regular physical activity/exercise, weight management, moderate sodium restriction and increased consumption of fresh fruit, vegetables, and low fat dairy, alcohol moderation, and smoking cessation.;Monitor prescription use  compliance.    Expected Outcomes Short Term: Continued assessment and intervention until BP is < 140/60mm HG in hypertensive participants. < 130/106mm HG in hypertensive participants with diabetes, heart failure or chronic kidney disease.;Long Term: Maintenance of blood pressure at goal levels.    Lipids Yes    Intervention Provide education and support for participant on nutrition & aerobic/resistive exercise along with prescribed medications to achieve LDL 70mg , HDL >40mg .    Expected Outcomes Short Term: Participant states understanding of desired cholesterol values and is compliant with medications prescribed. Participant is following exercise prescription and nutrition guidelines.;Long Term: Cholesterol controlled with medications as prescribed, with individualized exercise RX and with personalized nutrition plan. Value goals: LDL < 70mg , HDL > 40 mg.          Education:Diabetes - Individual verbal and written instruction to review signs/symptoms of diabetes, desired ranges of glucose level fasting, after meals and with exercise. Acknowledge that pre and post exercise glucose checks  will be done for 3 sessions at entry of program.   Core Components/Risk Factors/Patient Goals Review:   Goals and Risk Factor Review     Row Name 04/20/24 1558             Core Components/Risk Factors/Patient Goals Review   Personal Goals Review Weight Management/Obesity;Tobacco Cessation;Hypertension       Review Melva states that she is still working towards weight loss, and has a long term goal to get down to 180 lbs. She weighed in today at 239.9 lbs. She is also working on cutting back on her smoking. She is down to 1/2 a pack of cigarettes per day, as she was previously doing 1 pack per day. She states that she does own a BP cuff, but has not been checking her readings routinely at home. We encouraged her to begin to check her BP at home to make sure her readings stay within normal ranges. She continues to take all of her medications as prescribed.       Expected Outcomes Short: Continue to cut back on smoking. Long: Continue to manage lifestyle risk factors.          Core Components/Risk Factors/Patient Goals at Discharge (Final Review):   Goals and Risk Factor Review - 04/20/24 1558       Core Components/Risk Factors/Patient Goals Review   Personal Goals Review Weight Management/Obesity;Tobacco Cessation;Hypertension    Review Alexiz states that she is still working towards weight loss, and has a long term goal to get down to 180 lbs. She weighed in today at 239.9 lbs. She is also working on cutting back on her smoking. She is down to 1/2 a pack of cigarettes per day, as she was previously doing 1 pack per day. She states that she does own a BP cuff, but has not been checking her readings routinely at home. We encouraged her to begin to check her BP at home to make sure her readings stay within normal ranges. She continues to take all of her medications as prescribed.    Expected Outcomes Short: Continue to cut back on smoking. Long: Continue to manage lifestyle risk factors.           ITP Comments:  ITP Comments     Row Name 04/03/24 1011 04/10/24 1540 04/26/24 1305       ITP Comments Completed initial orientation and for cardiac rehab. Initial ITP created and sent for review to Dr. Oneil Pinal, Medical Director. First full day of exercise!  Patient was oriented to gym and equipment including functions, settings, policies, and procedures.  Patient's individual exercise prescription and treatment plan were reviewed.  All starting workloads were established based on the results of the 6 minute walk test done at initial orientation visit.  The plan for exercise progression was also introduced and progression will be customized based on patient's performance and goals. 30 Day review completed. Medical Director ITP review done, changes made as directed, and signed approval by Medical Director. new to program        Comments: 30 day ITP review

## 2024-04-27 ENCOUNTER — Encounter

## 2024-05-01 ENCOUNTER — Encounter: Payer: Self-pay | Admitting: Cardiovascular Disease

## 2024-05-01 ENCOUNTER — Ambulatory Visit: Admitting: Cardiovascular Disease

## 2024-05-01 ENCOUNTER — Encounter: Admitting: Emergency Medicine

## 2024-05-01 VITALS — BP 114/76 | HR 74 | Ht 64.0 in | Wt 238.6 lb

## 2024-05-01 DIAGNOSIS — Z955 Presence of coronary angioplasty implant and graft: Secondary | ICD-10-CM | POA: Diagnosis not present

## 2024-05-01 DIAGNOSIS — Z0130 Encounter for examination of blood pressure without abnormal findings: Secondary | ICD-10-CM

## 2024-05-01 DIAGNOSIS — R0789 Other chest pain: Secondary | ICD-10-CM | POA: Diagnosis not present

## 2024-05-01 DIAGNOSIS — R0602 Shortness of breath: Secondary | ICD-10-CM | POA: Diagnosis not present

## 2024-05-01 DIAGNOSIS — Z131 Encounter for screening for diabetes mellitus: Secondary | ICD-10-CM

## 2024-05-01 DIAGNOSIS — I2510 Atherosclerotic heart disease of native coronary artery without angina pectoris: Secondary | ICD-10-CM | POA: Diagnosis not present

## 2024-05-01 DIAGNOSIS — I5033 Acute on chronic diastolic (congestive) heart failure: Secondary | ICD-10-CM

## 2024-05-01 DIAGNOSIS — Z9861 Coronary angioplasty status: Secondary | ICD-10-CM

## 2024-05-01 DIAGNOSIS — F17210 Nicotine dependence, cigarettes, uncomplicated: Secondary | ICD-10-CM

## 2024-05-01 MED ORDER — RANOLAZINE ER 1000 MG PO TB12: 1000.0000 mg | ORAL_TABLET | Freq: Two times a day (BID) | ORAL | 2 refills | Status: AC

## 2024-05-01 NOTE — Addendum Note (Signed)
 Addended by: FERNAND ALTER A on: 05/01/2024 09:59 AM   Modules accepted: Orders

## 2024-05-01 NOTE — Progress Notes (Addendum)
 Cardiology Office Note   Date:  05/01/2024   ID:  Hannah Kaiser, DOB Feb 23, 1961, MRN [id number]  PCP:  Vicci Duwaine SQUIBB, DO  Cardiologist:  Denyse Bathe, MD      History of Present Illness: Hannah Kaiser is a 63 y.o. female who presents for  Chief Complaint  Patient presents with   Follow-up    6 week follow up    Has chest pain and radiating to arm with numbness.      Past Medical History:  Diagnosis Date   Anemia    Anxiety    Arthritis    knees, thoracic spurs and arthritis   Asthma    Breast pain    CHF (congestive heart failure) (HCC)    Colon polyps    COPD (chronic obstructive pulmonary disease) (HCC)    Depression    Dysrhythmia    GERD (gastroesophageal reflux disease)    Glaucoma    Heart murmur    History of blood transfusion    Hypertension    Motion sickness    cars   Multilevel degenerative disc disease    Neuropathy    Bilateral arms and legs   Nipple discharge    Numbness of both lower extremities    bulging lumbar discs - per pt   Numbness of upper extremity    bilateral - degenerative cervical discs - per pt   Pneumonia    PONV (postoperative nausea and vomiting)    Sleep apnea    has CPAP   Smokers' cough (HCC)    Swelling of limb 03/11/2021   Wears dentures    full upper and lower     Past Surgical History:  Procedure Laterality Date   ABDOMINAL HYSTERECTOMY     BACK SURGERY     BELPHAROPTOSIS REPAIR     BROW LIFT Bilateral 03/24/2016   Procedure: BLEPHAROPLASTY;  Surgeon: Greig CHRISTELLA Gay, MD;  Location: [facility];  Service: Ophthalmology;  Laterality: Bilateral;  sleep apnea   CARDIAC CATHETERIZATION     COLONOSCOPY WITH PROPOFOL  N/A 10/19/2016   Procedure: COLONOSCOPY WITH PROPOFOL ;  Surgeon: Jinny Carmine, MD;  Location: [facility];  Service: Endoscopy;  Laterality: N/A;   COLONOSCOPY WITH PROPOFOL  N/A 05/12/2022   Procedure: COLONOSCOPY WITH PROPOFOL ;  Surgeon: Jinny Carmine, MD;  Location: [facility]  ENDOSCOPY;  Service: Endoscopy;  Laterality: N/A;   CORONARY STENT INTERVENTION N/A 03/07/2024   Procedure: CORONARY STENT INTERVENTION;  Surgeon: Ammon Blunt, MD;  Location: [facility] INVASIVE CV LAB;  Service: Cardiovascular;  Laterality: N/A;   DILATION AND CURETTAGE OF UTERUS     ECTOPIC PREGNANCY SURGERY     x2   ESOPHAGOGASTRODUODENOSCOPY (EGD) WITH PROPOFOL  N/A 10/19/2016   Procedure: ESOPHAGOGASTRODUODENOSCOPY (EGD) WITH PROPOFOL ;  Surgeon: Jinny Carmine, MD;  Location: [facility];  Service: Endoscopy;  Laterality: N/A;   LEFT HEART CATH AND CORONARY ANGIOGRAPHY Left 12/29/2016   Procedure: Left Heart Cath and Coronary Angiography;  Surgeon: Bathe Denyse LABOR, MD;  Location: [facility] INVASIVE CV LAB;  Service: Cardiovascular;  Laterality: Left;   LEFT HEART CATH AND CORONARY ANGIOGRAPHY Left 03/07/2024   Procedure: LEFT HEART CATH AND CORONARY ANGIOGRAPHY with possible coronary intervention;  Surgeon: Bathe Denyse LABOR, MD;  Location: [facility] INVASIVE CV LAB;  Service: Cardiovascular;  Laterality: Left;   NECK SURGERY  2003   POLYPECTOMY  10/19/2016   Procedure: POLYPECTOMY;  Surgeon: Jinny Carmine, MD;  Location: [facility];  Service: Endoscopy;;   SPINE SURGERY  1989, 1998   TOTAL ABDOMINAL HYSTERECTOMY W/ BILATERAL SALPINGOOPHORECTOMY  2014   TUBAL LIGATION       Current Outpatient Medications  Medication Sig Dispense Refill   albuterol  (VENTOLIN  HFA) 108 (90 Base) MCG/ACT inhaler Inhale 1-2 puffs into the lungs every 6 (six) hours as needed for wheezing or shortness of breath. 18 each 1   aluminum -magnesium  hydroxide-simethicone  (MAALOX) 200-200-20 MG/5ML SUSP Take 30 mLs by mouth 4 (four) times daily -  before meals and at bedtime. 1680 mL 0   amLODipine  (NORVASC ) 10 MG tablet Take 1 tablet (10 mg total) by mouth daily. 90 tablet 1   aspirin  EC 81 MG tablet Take 1 tablet (81 mg total) by mouth daily. Swallow whole. 30 tablet 12   atenolol  (TENORMIN ) 50 MG tablet Take 1  tablet (50 mg total) by mouth daily. 90 tablet 1   budesonide -glycopyrrolate -formoterol  (BREZTRI  AEROSPHERE) 160-9-4.8 MCG/ACT AERO inhaler Inhale 2 puffs into the lungs 2 (two) times daily.     busPIRone  (BUSPAR ) 15 MG tablet Take 1 tablet (15 mg total) by mouth 3 (three) times daily. 270 tablet 1   clonazePAM  (KLONOPIN ) 1 MG tablet Take 1 tablet (1 mg total) by mouth 2 (two) times daily as needed. for anxiety 60 tablet 0   Evolocumab  (REPATHA  SURECLICK) 140 MG/ML SOAJ Inject 140 mg into the skin every 14 (fourteen) days. 2 mL 2   ezetimibe  (ZETIA ) 10 MG tablet Take 1 tablet (10 mg total) by mouth daily. 90 tablet 1   FLUoxetine  (PROZAC ) 40 MG capsule Take 2 capsules (80 mg total) by mouth daily. 180 capsule 1   furosemide  (LASIX ) 20 MG tablet TAKE 1 TABLET BY MOUTH ONCE DAILY AS NEEDED FOR SWELLING 60 tablet 3   hydrOXYzine  (ATARAX ) 25 MG tablet Take 1 tablet (25 mg total) by mouth 3 (three) times daily as needed. 270 tablet 1   isosorbide  mononitrate (IMDUR ) 60 MG 24 hr tablet Take 1 tablet (60 mg total) by mouth daily. 90 tablet 1   losartan  (COZAAR ) 100 MG tablet Take 1 tablet (100 mg total) by mouth daily. 90 tablet 1   nicotine  (NICODERM CQ  - DOSED IN MG/24 HOURS) 21 mg/24hr patch Place 1 patch (21 mg total) onto the skin daily. 28 patch 1   ondansetron  (ZOFRAN ) 4 MG tablet Take 1 tablet (4 mg total) by mouth every 8 (eight) hours as needed for nausea or vomiting. 60 tablet 3   pantoprazole  (PROTONIX ) 40 MG tablet Take 1 tablet (40 mg total) by mouth 2 (two) times daily. 180 tablet 1   prasugrel  (EFFIENT ) 10 MG TABS tablet Take 1 tablet (10 mg total) by mouth daily. 30 tablet 0   QUEtiapine  Fumarate (SEROQUEL  XR) 150 MG 24 hr tablet Take 1 tablet (150 mg total) by mouth at bedtime. 90 tablet 0   ranolazine  (RANEXA ) 1000 MG SR tablet Take 1 tablet (1,000 mg total) by mouth 2 (two) times daily. 60 tablet 2   [START ON 05/02/2024] semaglutide -weight management (WEGOVY ) 0.5 MG/0.5ML SOAJ SQ  injection Inject 0.5 mg into the skin once a week. 2 mL 1   [START ON 05/30/2024] semaglutide -weight management (WEGOVY ) 1 MG/0.5ML SOAJ SQ injection Inject 1 mg into the skin once a week. 2 mL 1   tiZANidine  (ZANAFLEX ) 4 MG tablet Take 1 tablet (4 mg total) by mouth every 8 (eight) hours as needed for muscle spasms. 90 tablet 1   Vitamin D , Ergocalciferol , (DRISDOL ) 1.25 MG (50000 UNIT) CAPS capsule TAKE 1 CAPSULE BY MOUTH  ONE TIME PER WEEK 12 capsule 0   No current facility-administered medications for this visit.    Allergies:   Aripiprazole , Bupropion, Ace inhibitors, Paxlovid [nirmatrelvir-ritonavir], Statins, Azithromycin, and Erythromycin    Social History:   reports that she has been smoking cigarettes. She has a 61.5 pack-year smoking history. She has never used smokeless tobacco. She reports that she does not drink alcohol and does not use drugs.   Family History:  family history includes Cancer in her brother, maternal grandfather, and maternal uncle.    ROS:     Review of Systems  Constitutional: Negative.   HENT: Negative.    Eyes: Negative.   Respiratory: Negative.    Gastrointestinal: Negative.   Genitourinary: Negative.   Musculoskeletal: Negative.   Skin: Negative.   Neurological: Negative.   Endo/Heme/Allergies: Negative.   Psychiatric/Behavioral: Negative.    All other systems reviewed and are negative.     All other systems are reviewed and negative.    PHYSICAL EXAM: VS:  BP 114/76   Pulse 74   Ht 5' 4 (1.626 m)   Wt 238 lb 9.6 oz (108.2 kg)   SpO2 97%   BMI 40.96 kg/m  , BMI Body mass index is 40.96 kg/m. Last weight:  Wt Readings from Last 3 Encounters:  05/01/24 238 lb 9.6 oz (108.2 kg)  04/11/24 240 lb 3.2 oz (109 kg)  04/03/24 241 lb 3.2 oz (109.4 kg)     Physical Exam Constitutional:      Appearance: Normal appearance.  Cardiovascular:     Rate and Rhythm: Normal rate and regular rhythm.     Heart sounds: Normal heart sounds.   Pulmonary:     Effort: Pulmonary effort is normal.     Breath sounds: Normal breath sounds.  Musculoskeletal:     Right lower leg: No edema.     Left lower leg: No edema.  Neurological:     Mental Status: She is alert.       EKG:   Recent Labs: 04/11/2024: ALT 12; BUN 14; Creatinine, Ser 0.84; Hemoglobin 11.7; Platelets 260; Potassium 4.1; Sodium 145; TSH 0.543    Lipid Panel    Component Value Date/Time   CHOL 186 04/11/2024 1019   TRIG 179 (H) 04/11/2024 1019   HDL 47 04/11/2024 1019   CHOLHDL 4.7 (H) 08/30/2020 0904   LDLCALC 108 (H) 04/11/2024 1019      Other studies Reviewed: Additional studies/ records that were reviewed today include:  Review of the above records demonstrates:       No data to display            ASSESSMENT AND PLAN:    ICD-10-CM   1. Nicotine  dependence, cigarettes, uncomplicated  F17.210 MYOCARDIAL PERFUSION IMAGING    ranolazine  (RANEXA ) 1000 MG SR tablet    PCV ECHOCARDIOGRAM COMPLETE    2. Other chest pain  R07.89 MYOCARDIAL PERFUSION IMAGING    ranolazine  (RANEXA ) 1000 MG SR tablet    PCV ECHOCARDIOGRAM COMPLETE   increase ranexa  1000 bid    3. SOB (shortness of breath)  R06.02 MYOCARDIAL PERFUSION IMAGING    ranolazine  (RANEXA ) 1000 MG SR tablet    PCV ECHOCARDIOGRAM COMPLETE   Has SOB, and swelling legs, check echo    4. CHF (congestive heart failure), NYHA class III, acute on chronic, diastolic (HCC)  I50.33 MYOCARDIAL PERFUSION IMAGING    ranolazine  (RANEXA ) 1000 MG SR tablet    PCV ECHOCARDIOGRAM COMPLETE    5. CAD S/P percutaneous  coronary angioplasty  I25.10 MYOCARDIAL PERFUSION IMAGING   Z98.61 ranolazine  (RANEXA ) 1000 MG SR tablet    PCV ECHOCARDIOGRAM COMPLETE   Recurrent chest pain with SOB, and had PCI proximal LAD, will do stress test       Problem List Items Addressed This Visit       Cardiovascular and Mediastinum   CHF (congestive heart failure), NYHA class III, acute on chronic, diastolic (HCC)    Relevant Medications   ranolazine  (RANEXA ) 1000 MG SR tablet   Other Relevant Orders   MYOCARDIAL PERFUSION IMAGING   PCV ECHOCARDIOGRAM COMPLETE     Other   Nicotine  dependence, cigarettes, uncomplicated - Primary   Relevant Medications   ranolazine  (RANEXA ) 1000 MG SR tablet   Other Relevant Orders   MYOCARDIAL PERFUSION IMAGING   PCV ECHOCARDIOGRAM COMPLETE   Other chest pain   Relevant Medications   ranolazine  (RANEXA ) 1000 MG SR tablet   Other Relevant Orders   MYOCARDIAL PERFUSION IMAGING   PCV ECHOCARDIOGRAM COMPLETE   SOB (shortness of breath)   Relevant Medications   ranolazine  (RANEXA ) 1000 MG SR tablet   Other Relevant Orders   MYOCARDIAL PERFUSION IMAGING   PCV ECHOCARDIOGRAM COMPLETE   Other Visit Diagnoses       CAD S/P percutaneous coronary angioplasty       Recurrent chest pain with SOB, and had PCI proximal LAD, will do stress test   Relevant Medications   ranolazine  (RANEXA ) 1000 MG SR tablet   Other Relevant Orders   MYOCARDIAL PERFUSION IMAGING   PCV ECHOCARDIOGRAM COMPLETE          Disposition:   Return in about 3 weeks (around 05/22/2024) for stress test and echo and f/u.    Total time spent: 35 minutes  Signed,  Denyse Bathe, MD  05/01/2024 9:59 AM    [facility]

## 2024-05-01 NOTE — Progress Notes (Signed)
 Daily Session Note  Patient Details  Name: Hannah Kaiser MRN: [id number] Date of Birth: 02/04/1961 Referring Provider:   Flowsheet Row Cardiac Rehab from 04/03/2024 in [facility] Cardiac and Pulmonary Rehab  Referring Provider Dr. Denyse Bathe, MD    Encounter Date: 05/01/2024  Check In:  Session Check In - 05/01/24 1521       Check-In   Supervising physician immediately available to respond to emergencies See telemetry face sheet for immediately available ER MD    Location [facility]-Cardiac & Pulmonary Rehab    Staff Present Leita Franks RN,BSN;Maxon Conetta BS, Exercise Physiologist;Joseph Hood RCP,RRT,BSRT;Margaret Best, MS, Exercise Physiologist    Virtual Visit No    Medication changes reported     Yes    Comments increase renexa to 1000mg  BID    Fall or balance concerns reported    No    Tobacco Cessation No Change    Warm-up and Cool-down Performed on first and last piece of equipment    Resistance Training Performed Yes    VAD Patient? No    PAD/SET Patient? No      Pain Assessment   Currently in Pain? No/denies             Social History   Tobacco Use  Smoking Status Every Day   Current packs/day: 1.50   Average packs/day: 1.5 packs/day for 41.0 years (61.5 ttl pk-yrs)   Types: Cigarettes  Smokeless Tobacco Never  Tobacco Comments   since age 11    Goals Met:  Independence with exercise equipment Exercise tolerated well No report of concerns or symptoms today Strength training completed today  Goals Unmet:  Not Applicable  Comments: Pt able to follow exercise prescription today without complaint.  Will continue to monitor for progression.    Dr. Oneil Pinal is Medical Director for [facility] Cardiac Rehabilitation.  Dr. Fuad Aleskerov is Medical Director for [facility] Pulmonary Rehabilitation.

## 2024-05-08 ENCOUNTER — Encounter

## 2024-05-09 ENCOUNTER — Other Ambulatory Visit: Payer: Self-pay | Admitting: Family Medicine

## 2024-05-11 ENCOUNTER — Encounter

## 2024-05-11 ENCOUNTER — Ambulatory Visit (INDEPENDENT_AMBULATORY_CARE_PROVIDER_SITE_OTHER)

## 2024-05-11 DIAGNOSIS — I2510 Atherosclerotic heart disease of native coronary artery without angina pectoris: Secondary | ICD-10-CM

## 2024-05-11 DIAGNOSIS — R0789 Other chest pain: Secondary | ICD-10-CM

## 2024-05-11 DIAGNOSIS — R0602 Shortness of breath: Secondary | ICD-10-CM

## 2024-05-11 DIAGNOSIS — I5033 Acute on chronic diastolic (congestive) heart failure: Secondary | ICD-10-CM

## 2024-05-11 DIAGNOSIS — F17210 Nicotine dependence, cigarettes, uncomplicated: Secondary | ICD-10-CM

## 2024-05-11 MED ORDER — TECHNETIUM TC 99M SESTAMIBI GENERIC - CARDIOLITE
30.8000 | Freq: Once | INTRAVENOUS | Status: AC | PRN
  Administered 2024-05-11: 30.8 via INTRAVENOUS

## 2024-05-11 MED ORDER — TECHNETIUM TC 99M SESTAMIBI GENERIC - CARDIOLITE
10.8000 | Freq: Once | INTRAVENOUS | Status: AC | PRN
  Administered 2024-05-11: 10.8 via INTRAVENOUS

## 2024-05-15 ENCOUNTER — Encounter: Attending: Cardiovascular Disease | Admitting: Emergency Medicine

## 2024-05-15 DIAGNOSIS — Z955 Presence of coronary angioplasty implant and graft: Secondary | ICD-10-CM | POA: Insufficient documentation

## 2024-05-15 NOTE — Progress Notes (Signed)
 Daily Session Note  Patient Details  Name: Hannah Kaiser MRN: [id number] Date of Birth: November 13, 1960 Referring Provider:   Flowsheet Row Cardiac Rehab from 04/03/2024 in [facility] Cardiac and Pulmonary Rehab  Referring Provider Dr. Denyse Bathe, MD    Encounter Date: 05/15/2024  Check In:  Session Check In - 05/15/24 1532       Check-In   Supervising physician immediately available to respond to emergencies See telemetry face sheet for immediately available ER MD    Location [facility]-Cardiac & Pulmonary Rehab    Staff Present Leita Franks RN,BSN;Joseph [facility] BS, Exercise Physiologist;Margaret Best, MS, Exercise Physiologist    Virtual Visit No    Medication changes reported     No    Fall or balance concerns reported    No    Tobacco Cessation Use Decreased    Current number of cigarettes/nicotine  per day     0   no cigarettes for 1 week   Warm-up and Cool-down Performed on first and last piece of equipment    Resistance Training Performed Yes    VAD Patient? No    PAD/SET Patient? No      Pain Assessment   Currently in Pain? No/denies             Social History   Tobacco Use  Smoking Status Every Day   Current packs/day: 1.50   Average packs/day: 1.5 packs/day for 41.0 years (61.5 ttl pk-yrs)   Types: Cigarettes  Smokeless Tobacco Never  Tobacco Comments   since age 77    Goals Met:  Independence with exercise equipment Exercise tolerated well No report of concerns or symptoms today Strength training completed today  Goals Unmet:  Not Applicable  Comments: Pt able to follow exercise prescription today without complaint.  Will continue to monitor for progression.    Dr. Oneil Pinal is Medical Director for [facility] Cardiac Rehabilitation.  Dr. Fuad Aleskerov is Medical Director for [facility] Pulmonary Rehabilitation.

## 2024-05-18 ENCOUNTER — Encounter: Admitting: *Deleted

## 2024-05-18 DIAGNOSIS — Z955 Presence of coronary angioplasty implant and graft: Secondary | ICD-10-CM | POA: Diagnosis not present

## 2024-05-18 NOTE — Progress Notes (Signed)
 Daily Session Note  Patient Details  Name: Hannah Kaiser MRN: [id number] Date of Birth: [birth year] Referring Provider:   Flowsheet Row Cardiac Rehab from 04/03/2024 in [facility] Cardiac and Pulmonary Rehab  Referring Provider Dr. Denyse Bathe, MD    Encounter Date: 05/18/2024  Check In:  Session Check In - 05/18/24 1546       Check-In   Supervising physician immediately available to respond to emergencies See telemetry face sheet for immediately available ER MD    Location [facility]-Cardiac & Pulmonary Rehab    Staff Present Hoy Rodney RN,BSN;Maxon Conetta BS, Exercise Physiologist;Joseph Hood RCP,RRT,BSRT;Noah Tickle, BS, Exercise Physiologist    Virtual Visit No    Medication changes reported     No    Fall or balance concerns reported    No    Tobacco Cessation No Change    Warm-up and Cool-down Performed on first and last piece of equipment    Resistance Training Performed Yes    VAD Patient? No    PAD/SET Patient? No      Pain Assessment   Currently in Pain? No/denies             Tobacco Use History[1]  Goals Met:  Independence with exercise equipment Exercise tolerated well No report of concerns or symptoms today Strength training completed today  Goals Unmet:  Not Applicable  Comments: Pt able to follow exercise prescription today without complaint.  Will continue to monitor for progression.    Dr. Oneil Pinal is Medical Director for [facility] Cardiac Rehabilitation.  Dr. Fuad Aleskerov is Medical Director for [facility] Pulmonary Rehabilitation.    [1]  Social History Tobacco Use  Smoking Status Every Day   Current packs/day: 1.50   Average packs/day: 1.5 packs/day for 41.0 years (61.5 ttl pk-yrs)   Types: Cigarettes  Smokeless Tobacco Never  Tobacco Comments   since age 66

## 2024-05-22 ENCOUNTER — Encounter

## 2024-05-23 ENCOUNTER — Encounter: Payer: Self-pay | Admitting: Family Medicine

## 2024-05-23 ENCOUNTER — Ambulatory Visit: Admitting: Family Medicine

## 2024-05-23 ENCOUNTER — Other Ambulatory Visit: Payer: Self-pay

## 2024-05-23 VITALS — BP 111/70 | HR 66 | Temp 97.8°F | Ht 64.0 in | Wt 240.0 lb

## 2024-05-23 DIAGNOSIS — Z789 Other specified health status: Secondary | ICD-10-CM

## 2024-05-23 DIAGNOSIS — I2510 Atherosclerotic heart disease of native coronary artery without angina pectoris: Secondary | ICD-10-CM

## 2024-05-23 DIAGNOSIS — E039 Hypothyroidism, unspecified: Secondary | ICD-10-CM

## 2024-05-23 DIAGNOSIS — R7301 Impaired fasting glucose: Secondary | ICD-10-CM

## 2024-05-23 DIAGNOSIS — E782 Mixed hyperlipidemia: Secondary | ICD-10-CM

## 2024-05-23 DIAGNOSIS — J209 Acute bronchitis, unspecified: Secondary | ICD-10-CM

## 2024-05-23 DIAGNOSIS — I5033 Acute on chronic diastolic (congestive) heart failure: Secondary | ICD-10-CM

## 2024-05-23 DIAGNOSIS — I129 Hypertensive chronic kidney disease with stage 1 through stage 4 chronic kidney disease, or unspecified chronic kidney disease: Secondary | ICD-10-CM

## 2024-05-23 MED ORDER — AMLODIPINE BESYLATE 10 MG PO TABS: 10.0000 mg | ORAL_TABLET | Freq: Every day | ORAL | 1 refills | Status: AC

## 2024-05-23 MED ORDER — WEGOVY 1 MG/0.5ML ~~LOC~~ SOAJ
1.0000 mg | SUBCUTANEOUS | 1 refills | Status: AC
  Filled 2024-05-23: qty 2, 28d supply, fill #0

## 2024-05-23 MED ORDER — LOSARTAN POTASSIUM 100 MG PO TABS: 100.0000 mg | ORAL_TABLET | Freq: Every day | ORAL | 1 refills | Status: AC

## 2024-05-23 MED ORDER — METHYLPREDNISOLONE SODIUM SUCC 40 MG IJ SOLR
40.0000 mg | Freq: Once | INTRAMUSCULAR | Status: AC
  Administered 2024-05-23: 12:00:00 40 mg via INTRAMUSCULAR

## 2024-05-23 MED ORDER — BUSPIRONE HCL 15 MG PO TABS: 15.0000 mg | ORAL_TABLET | Freq: Three times a day (TID) | ORAL | 1 refills | Status: AC

## 2024-05-23 MED ORDER — ISOSORBIDE MONONITRATE ER 60 MG PO TB24: 60.0000 mg | ORAL_TABLET | Freq: Every day | ORAL | 1 refills | Status: AC

## 2024-05-23 MED ORDER — PANTOPRAZOLE SODIUM 40 MG PO TBEC: 40.0000 mg | DELAYED_RELEASE_TABLET | Freq: Two times a day (BID) | ORAL | 1 refills | Status: AC

## 2024-05-23 MED ORDER — PREDNISONE 10 MG PO TABS: ORAL_TABLET | ORAL | 0 refills | Status: DC

## 2024-05-23 MED ORDER — ATENOLOL 50 MG PO TABS: 50.0000 mg | ORAL_TABLET | Freq: Every day | ORAL | 1 refills | Status: AC

## 2024-05-23 MED ORDER — WEGOVY 0.25 MG/0.5ML ~~LOC~~ SOAJ
0.2500 mg | SUBCUTANEOUS | 0 refills | Status: DC
  Filled 2024-05-23: qty 2, 28d supply, fill #0

## 2024-05-23 MED ORDER — DOXYCYCLINE HYCLATE 100 MG PO TABS: 100.0000 mg | ORAL_TABLET | Freq: Two times a day (BID) | ORAL | 0 refills | Status: DC

## 2024-05-23 MED ORDER — WEGOVY 0.5 MG/0.5ML ~~LOC~~ SOAJ
0.5000 mg | SUBCUTANEOUS | 1 refills | Status: AC
  Filled 2024-05-23 – 2024-06-19 (×2): qty 2, 28d supply, fill #0

## 2024-05-23 NOTE — Progress Notes (Unsigned)
 BP 111/70   Pulse 66   Temp 97.8 F (36.6 C) (Oral)   Ht 5' 4 (1.626 m)   Wt 240 lb (108.9 kg)   SpO2 95%   BMI 41.20 kg/m    Subjective:    Patient ID: Hannah Kaiser, female    DOB: 07-05-1960, 63 y.o.   MRN: [id number]  HPI: Hannah Kaiser is a 63 y.o. female  Chief Complaint  Patient presents with   Obesity   Sinusitis    Onset last Thursday   OBESITY- started her wegovy  in September, but had to stop it due to insurance coverage- restarted for a month in November but has been off for about 3-4 weeks Duration: chronic Previous attempts at weight loss: yes- portion control, diet, exercise Complications of obesity: IFG, CAD, HTN, HLD, GERD, back pain Peak weight: 254lbs Weight loss goal: to be healthy Weight loss to date: 14lbs Requesting obesity pharmacotherapy: yes Current weight loss supplements/medications: yes Previous weight loss supplements/meds: no  UPPER RESPIRATORY TRACT INFECTION Duration: about 5 days Worst symptom: sore nose, congestion, cough Fever: yes Cough: yes Shortness of breath: yes Wheezing: yes Chest pain: no Chest tightness: yes Chest congestion: yes Nasal congestion: yes Runny nose: no Post nasal drip: no Sneezing: no Sore throat: no Swollen glands: no Sinus pressure: yes Headache: no Face pain: no Toothache: yes Ear pain: no  Ear pressure: yes bilateral Eyes red/itching:no Eye drainage/crusting: no  Vomiting: no Rash: no Fatigue: yes Sick contacts: yes Strep contacts: no  Context: stable Recurrent sinusitis: no Relief with OTC cold/cough medications: no  Treatments attempted: cold/sinus, mucinex , anti-histamine, and pseudoephedrine   HYPERTENSION / HYPERLIPIDEMIA Satisfied with current treatment? {Blank single:19197::yes,no} Duration of hypertension: {Blank single:19197::chronic,months,years} BP monitoring frequency: {Blank single:19197::not checking,rarely,daily,weekly,monthly,a few times a day,a  few times a week,a few times a month} BP range:  BP medication side effects: {Blank single:19197::yes,no} Past BP meds: {Blank multiple:19196::none,amlodipine ,amlodipine /benazepril,atenolol ,benazepril,benazepril/HCTZ,bisoprolol (bystolic),carvedilol,chlorthalidone,clonidine,diltiazem,exforge HCT,HCTZ,irbesartan (avapro),labetalol ,lisinopril,lisinopril-HCTZ,losartan  (cozaar ),methyldopa,nifedipine,olmesartan (benicar),olmesartan-HCTZ,quinapril,ramipril,spironalactone,tekturna,valsartan,valsartan-HCTZ,verapamil} Duration of hyperlipidemia: {Blank single:19197::chronic,months,years} Cholesterol medication side effects: {Blank single:19197::yes,no} Cholesterol supplements: {Blank multiple:19196::none,fish oil,niacin,red yeast rice} Past cholesterol medications: {Blank multiple:19196::none,atorvastain (lipitor),lovastatin (mevacor),pravastatin (pravachol),rosuvastatin  (crestor ),simvastatin (zocor),vytorin,fenofibrate (tricor),gemfibrozil,ezetimide (zetia ),niaspan,lovaza} Medication compliance: {Blank single:19197::excellent compliance,good compliance,fair compliance,poor compliance} Aspirin : {Blank single:19197::yes,no} Recent stressors: {Blank single:19197::yes,no} Recurrent headaches: {Blank single:19197::yes,no} Visual changes: {Blank single:19197::yes,no} Palpitations: {Blank single:19197::yes,no} Dyspnea: {Blank single:19197::yes,no} Chest pain: {Blank single:19197::yes,no} Lower extremity edema: {Blank single:19197::yes,no} Dizzy/lightheaded: {Blank single:19197::yes,no}  ANXIETY/STRESS Duration:{Blank single:19197::controlled,uncontrolled,better,worse,exacerbated,stable} Anxious mood: {Blank single:19197::yes,no}  Excessive worrying: {Blank single:19197::yes,no} Irritability: {Blank single:19197::yes,no}  Sweating:  {Blank single:19197::yes,no} Nausea: {Blank single:19197::yes,no} Palpitations:{Blank single:19197::yes,no} Hyperventilation: {Blank single:19197::yes,no} Panic attacks: {Blank single:19197::yes,no} Agoraphobia: {Blank single:19197::yes,no}  Obscessions/compulsions: {Blank single:19197::yes,no} Depressed mood: {Blank single:19197::yes,no}    05/15/2024    3:39 PM 04/20/2024    3:52 PM 04/11/2024    9:38 AM 04/03/2024   11:37 AM 01/27/2024    1:47 PM  Depression screen PHQ 2/9  Decreased Interest 1 1 2 1 1   Down, Depressed, Hopeless 0 0 1 0 0  PHQ - 2 Score 1 1 3 1 1   Altered sleeping 3 3 1 1 1   Tired, decreased energy 3 3 3 2 2   Change in appetite 0 0 1 1 2   Feeling bad or failure about yourself  0 0 0 0 0  Trouble concentrating 1 1 1 3 2   Moving slowly or fidgety/restless 0 0 0 1 0  Suicidal thoughts 0 0 0 0 0  PHQ-9 Score 8 8 9  9  8    Difficult doing work/chores Somewhat difficult Somewhat difficult Somewhat difficult Somewhat  difficult Somewhat difficult     Data saved with a previous flowsheet row definition   Anhedonia: {Blank single:19197::yes,no} Weight changes: {Blank single:19197::yes,no} Insomnia: {Blank single:19197::yes,no} {Blank single:19197::hard to fall asleep,hard to stay asleep}  Hypersomnia: {Blank single:19197::yes,no} Fatigue/loss of energy: {Blank single:19197::yes,no} Feelings of worthlessness: {Blank single:19197::yes,no} Feelings of guilt: {Blank single:19197::yes,no} Impaired concentration/indecisiveness: {Blank single:19197::yes,no} Suicidal ideations: {Blank single:19197::yes,no}  Crying spells: {Blank single:19197::yes,no} Recent Stressors/Life Changes: {Blank single:19197::yes,no}   Relationship problems: {Blank single:19197::yes,no}   Family stress: {Blank single:19197::yes,no}     Financial stress: {Blank single:19197::yes,no}    Job stress: {Blank  single:19197::yes,no}    Recent death/loss: {Blank single:19197::yes,no}   Relevant past medical, surgical, family and social history reviewed and updated as indicated. Interim medical history since our last visit reviewed. Allergies and medications reviewed and updated.  Review of Systems  Per HPI unless specifically indicated above     Objective:    BP 111/70   Pulse 66   Temp 97.8 F (36.6 C) (Oral)   Ht 5' 4 (1.626 m)   Wt 240 lb (108.9 kg)   SpO2 95%   BMI 41.20 kg/m   Wt Readings from Last 3 Encounters:  05/23/24 240 lb (108.9 kg)  05/01/24 238 lb 9.6 oz (108.2 kg)  04/11/24 240 lb 3.2 oz (109 kg)    Physical Exam  Results for orders placed or performed in visit on 04/11/24  Comprehensive metabolic panel with GFR   Collection Time: 04/11/24 10:19 AM  Result Value Ref Range   Glucose 89 70 - 99 mg/dL   BUN 14 8 - 27 mg/dL   Creatinine, Ser 9.15 0.57 - 1.00 mg/dL   eGFR 79 >40 fO/fpw/8.26   BUN/Creatinine Ratio 17 12 - 28   Sodium 145 (H) 134 - 144 mmol/L   Potassium 4.1 3.5 - 5.2 mmol/L   Chloride 109 (H) 96 - 106 mmol/L   CO2 16 (L) 20 - 29 mmol/L   Calcium  9.4 8.7 - 10.3 mg/dL   Total Protein 6.5 6.0 - 8.5 g/dL   Albumin 4.5 3.9 - 4.9 g/dL   Globulin, Total 2.0 1.5 - 4.5 g/dL   Bilirubin Total 0.4 0.0 - 1.2 mg/dL   Alkaline Phosphatase 68 49 - 135 IU/L   AST 16 0 - 40 IU/L   ALT 12 0 - 32 IU/L  Lipid Panel w/o Chol/HDL Ratio   Collection Time: 04/11/24 10:19 AM  Result Value Ref Range   Cholesterol, Total 186 100 - 199 mg/dL   Triglycerides 820 (H) 0 - 149 mg/dL   HDL 47 >60 mg/dL   VLDL Cholesterol Cal 31 5 - 40 mg/dL   LDL Chol Calc (NIH) 891 (H) 0 - 99 mg/dL  CBC with Differential/Platelet   Collection Time: 04/11/24 10:19 AM  Result Value Ref Range   WBC 5.0 3.4 - 10.8 x10E3/uL   RBC 4.02 3.77 - 5.28 x10E6/uL   Hemoglobin 11.7 11.1 - 15.9 g/dL   Hematocrit 63.8 65.9 - 46.6 %   MCV 90 79 - 97 fL   MCH 29.1 26.6 - 33.0 pg   MCHC  32.4 31.5 - 35.7 g/dL   RDW 86.5 88.2 - 84.5 %   Platelets 260 150 - 450 x10E3/uL   Neutrophils 57 Not Estab. %   Lymphs 33 Not Estab. %   Monocytes 6 Not Estab. %   Eos 3 Not Estab. %   Basos 1 Not Estab. %   Neutrophils Absolute 2.8 1.4 - 7.0 x10E3/uL   Lymphocytes Absolute 1.7  0.7 - 3.1 x10E3/uL   Monocytes Absolute 0.3 0.1 - 0.9 x10E3/uL   EOS (ABSOLUTE) 0.2 0.0 - 0.4 x10E3/uL   Basophils Absolute 0.0 0.0 - 0.2 x10E3/uL   Immature Granulocytes 0 Not Estab. %   Immature Grans (Abs) 0.0 0.0 - 0.1 x10E3/uL  TSH   Collection Time: 04/11/24 10:19 AM  Result Value Ref Range   TSH 0.543 0.450 - 4.500 uIU/mL  VITAMIN D  25 Hydroxy (Vit-D Deficiency, Fractures)   Collection Time: 04/11/24 10:19 AM  Result Value Ref Range   Vit D, 25-Hydroxy 31.5 30.0 - 100.0 ng/mL  Ferritin   Collection Time: 04/11/24 10:19 AM  Result Value Ref Range   Ferritin 18 15 - 150 ng/mL  Iron  Binding Cap (TIBC)([facility])   Collection Time: 04/11/24 10:19 AM  Result Value Ref Range   Total Iron  Binding Capacity 317 250 - 450 ug/dL   UIBC 732 881 - 630 ug/dL   Iron  50 27 - 139 ug/dL   Iron  Saturation 16 15 - 55 %  B12   Collection Time: 04/11/24 10:19 AM  Result Value Ref Range   Vitamin B-12 1,317 (H) 232 - 1,245 pg/mL  Hepatitis B surface antibody,quantitative   Collection Time: 04/11/24 10:19 AM  Result Value Ref Range   Hepatitis B Surf Ab Quant <3.5 (L) Immunity>10 mIU/mL      Assessment & Plan:   Problem List Items Addressed This Visit   None    Follow up plan: No follow-ups on file.

## 2024-05-24 DIAGNOSIS — Z955 Presence of coronary angioplasty implant and graft: Secondary | ICD-10-CM

## 2024-05-24 LAB — CBC WITH DIFFERENTIAL/PLATELET
Basophils Absolute: 0 x10E3/uL (ref 0.0–0.2)
Basos: 1 %
EOS (ABSOLUTE): 0.2 x10E3/uL (ref 0.0–0.4)
Eos: 3 %
Hematocrit: 29.6 % — ABNORMAL LOW (ref 34.0–46.6)
Hemoglobin: 9.5 g/dL — ABNORMAL LOW (ref 11.1–15.9)
Immature Grans (Abs): 0.1 x10E3/uL (ref 0.0–0.1)
Immature Granulocytes: 2 %
Lymphocytes Absolute: 1.6 x10E3/uL (ref 0.7–3.1)
Lymphs: 22 %
MCH: 28.5 pg (ref 26.6–33.0)
MCHC: 32.1 g/dL (ref 31.5–35.7)
MCV: 89 fL (ref 79–97)
Monocytes Absolute: 0.4 x10E3/uL (ref 0.1–0.9)
Monocytes: 5 %
Neutrophils Absolute: 4.9 x10E3/uL (ref 1.4–7.0)
Neutrophils: 67 %
Platelets: 310 x10E3/uL (ref 150–450)
RBC: 3.33 x10E6/uL — ABNORMAL LOW (ref 3.77–5.28)
RDW: 13 % (ref 11.7–15.4)
WBC: 7.3 x10E3/uL (ref 3.4–10.8)

## 2024-05-24 LAB — LIPID PANEL W/O CHOL/HDL RATIO
Cholesterol, Total: 138 mg/dL (ref 100–199)
HDL: 32 mg/dL — ABNORMAL LOW (ref >39)
LDL Chol Calc (NIH): 60 mg/dL (ref 0–99)
Triglycerides: 288 mg/dL — ABNORMAL HIGH (ref 0–149)
VLDL Cholesterol Cal: 46 mg/dL — ABNORMAL HIGH (ref 5–40)

## 2024-05-24 LAB — COMPREHENSIVE METABOLIC PANEL WITH GFR
ALT: 12 IU/L (ref 0–32)
AST: 16 IU/L (ref 0–40)
Albumin: 4.2 g/dL (ref 3.9–4.9)
Alkaline Phosphatase: 65 IU/L (ref 49–135)
BUN/Creatinine Ratio: 19 (ref 12–28)
BUN: 19 mg/dL (ref 8–27)
Bilirubin Total: 0.3 mg/dL (ref 0.0–1.2)
CO2: 20 mmol/L (ref 20–29)
Calcium: 9 mg/dL (ref 8.7–10.3)
Chloride: 105 mmol/L (ref 96–106)
Creatinine, Ser: 0.99 mg/dL (ref 0.57–1.00)
Globulin, Total: 2.1 g/dL (ref 1.5–4.5)
Glucose: 112 mg/dL — ABNORMAL HIGH (ref 70–99)
Potassium: 4.1 mmol/L (ref 3.5–5.2)
Sodium: 141 mmol/L (ref 134–144)
Total Protein: 6.3 g/dL (ref 6.0–8.5)
eGFR: 65 mL/min/1.73 (ref >59)

## 2024-05-24 NOTE — Progress Notes (Signed)
 Cardiac Individual Treatment Plan  Patient Details  Name: Hannah Kaiser MRN: [id number] Date of Birth: 03-Nov-1960 Referring Provider:   Flowsheet Row Cardiac Rehab from 04/03/2024 in [facility] Cardiac and Pulmonary Rehab  Referring Provider Dr. Denyse Bathe, MD    Initial Encounter Date:  Flowsheet Row Cardiac Rehab from 04/03/2024 in [facility] Cardiac and Pulmonary Rehab  Date 04/03/24    Visit Diagnosis: Status post coronary artery stent placement  Patient's Home Medications on Admission: Current Medications[1]  Past Medical History: Past Medical History:  Diagnosis Date   Anemia    Anxiety    Arthritis    knees, thoracic spurs and arthritis   Asthma    Breast pain    CHF (congestive heart failure) (HCC)    Colon polyps    COPD (chronic obstructive pulmonary disease) (HCC)    Depression    Dysrhythmia    GERD (gastroesophageal reflux disease)    Glaucoma    Heart murmur    History of blood transfusion    Hypertension    Motion sickness    cars   Multilevel degenerative disc disease    Neuropathy    Bilateral arms and legs   Nipple discharge    Numbness of both lower extremities    bulging lumbar discs - per pt   Numbness of upper extremity    bilateral - degenerative cervical discs - per pt   Pneumonia    PONV (postoperative nausea and vomiting)    Sleep apnea    has CPAP   Smokers' cough (HCC)    Swelling of limb 03/11/2021   Wears dentures    full upper and lower    Tobacco Use: Tobacco Use History[2]  Labs: Review Flowsheet  More data exists      Latest Ref Rng & Units 05/17/2023 10/19/2023 12/27/2023 04/11/2024 05/23/2024  Labs for ITP Cardiac and Pulmonary Rehab  Cholestrol 100 - 199 mg/dL 851  772  - 813  861   LDL (calc) 0 - 99 mg/dL 70  849  - 891  60   HDL-C >39 mg/dL 54  53  - 47  32   Trlycerides 0 - 149 mg/dL 860  864  - 820  711   Hemoglobin A1c 4.8 - 5.6 % 5.7  5.4  5.6  - -     Exercise Target Goals: Exercise Program Goal: Individual  exercise prescription set using results from initial 6 min walk test and THRR while considering  patients activity barriers and safety.   Exercise Prescription Goal: Initial exercise prescription builds to 30-45 minutes a day of aerobic activity, 2-3 days per week.  Home exercise guidelines will be given to patient during program as part of exercise prescription that the participant will acknowledge.   Education: Aerobic Exercise: - Group verbal and visual presentation on the components of exercise prescription. Introduces F.I.T.T principle from ACSM for exercise prescriptions.  Reviews F.I.T.T. principles of aerobic exercise including progression. Written material provided at class time.   Education: Resistance Exercise: - Group verbal and visual presentation on the components of exercise prescription. Introduces F.I.T.T principle from ACSM for exercise prescriptions  Reviews F.I.T.T. principles of resistance exercise including progression. Written material provided at class time.    Education: Exercise & Equipment Safety: - Individual verbal instruction and demonstration of equipment use and safety with use of the equipment.   Education: Exercise Physiology & General Exercise Guidelines: - Group verbal and written instruction with models to review the exercise physiology of the  cardiovascular system and associated critical values. Provides general exercise guidelines with specific guidelines to those with heart or lung disease. Written material provided at class time.   Education: Flexibility, Balance, Mind/Body Relaxation: - Group verbal and visual presentation with interactive activity on the components of exercise prescription. Introduces F.I.T.T principle from ACSM for exercise prescriptions. Reviews F.I.T.T. principles of flexibility and balance exercise training including progression. Also discusses the mind body connection.  Reviews various relaxation techniques to help reduce and  manage stress (i.e. Deep breathing, progressive muscle relaxation, and visualization). Balance handout provided to take home. Written material provided at class time.   Activity Barriers & Risk Stratification:  Activity Barriers & Cardiac Risk Stratification - 04/03/24 1403       Activity Barriers & Cardiac Risk Stratification   Activity Barriers Arthritis;Back Problems;Neck/Spine Problems;Balance Concerns;Shortness of Breath;Deconditioning    Cardiac Risk Stratification High          6 Minute Walk:  6 Minute Walk     Row Name 04/03/24 1402         6 Minute Walk   Phase Initial     Distance 975 feet     Walk Time 6 minutes     # of Rest Breaks 0     MPH 1.85     METS 1.9     RPE 11     Perceived Dyspnea  1     VO2 Peak 6.65     Symptoms Yes (comment)     Comments chest discomfort     Resting HR 55 bpm     Resting BP 126/72     Resting Oxygen Saturation  96 %     Exercise Oxygen Saturation  during 6 min walk 99 %     Max Ex. HR 78 bpm     Max Ex. BP 140/78     2 Minute Post BP 122/72        Oxygen Initial Assessment:   Oxygen Re-Evaluation:   Oxygen Discharge (Final Oxygen Re-Evaluation):   Initial Exercise Prescription:  Initial Exercise Prescription - 04/03/24 1400       Date of Initial Exercise RX and Referring Provider   Date 04/03/24    Referring Provider Dr. Denyse Bathe, MD      Oxygen   Maintain Oxygen Saturation 88% or higher      Treadmill   MPH 1.6    Grade 0    Minutes 15    METs 2.23      Recumbant Bike   Level 1    RPM 50    Watts 15    Minutes 15    METs 1.9      NuStep   Level 2    SPM 80    Minutes 15    METs 1.9      Prescription Details   Frequency (times per week) 2    Duration Progress to 30 minutes of continuous aerobic without signs/symptoms of physical distress      Intensity   THRR 40-80% of Max Heartrate 96-138    Ratings of Perceived Exertion 11-13    Perceived Dyspnea 0-4      Progression    Progression Continue to progress workloads to maintain intensity without signs/symptoms of physical distress.      Resistance Training   Training Prescription Yes    Weight 3 lb    Reps 10-15          Perform Capillary Blood Glucose checks as needed.  Exercise Prescription Changes:   Exercise Prescription Changes     Row Name 04/03/24 1400 04/18/24 0800 05/02/24 1300 05/16/24 1300       Response to Exercise   Blood Pressure (Admit) 126/72 106/58 124/70 102/68    Blood Pressure (Exercise) 140/78 118/60 136/70 118/58    Blood Pressure (Exit) 122/72 104/62 110/66 102/62    Heart Rate (Admit) 55 bpm 62 bpm 65 bpm 69 bpm    Heart Rate (Exercise) 78 bpm 114 bpm 96 bpm 100 bpm    Heart Rate (Exit) 59 bpm 62 bpm 65 bpm 103 bpm    Oxygen Saturation (Admit) 96 % -- -- --    Oxygen Saturation (Exercise) 99 % -- -- --    Rating of Perceived Exertion (Exercise) 11 14 15 15     Perceived Dyspnea (Exercise) 1 0 -- --    Symptoms chest discomfort none none none    Comments Results first 2 weeks of exercise -- --    Duration -- Progress to 30 minutes of  aerobic without signs/symptoms of physical distress Continue with 30 min of aerobic exercise without signs/symptoms of physical distress. Continue with 30 min of aerobic exercise without signs/symptoms of physical distress.    Intensity -- THRR unchanged THRR unchanged THRR unchanged      Progression   Progression -- Continue to progress workloads to maintain intensity without signs/symptoms of physical distress. Continue to progress workloads to maintain intensity without signs/symptoms of physical distress. Continue to progress workloads to maintain intensity without signs/symptoms of physical distress.    Average METs -- 2.5 2.57 2.79      Resistance Training   Training Prescription -- Yes Yes Yes    Weight -- 3lb 3 lb 3 lb    Reps -- 10-15 10-15 10-15      Interval Training   Interval Training -- No No No      Treadmill   MPH  -- 2 -- --    Grade -- 0 -- --    Minutes -- 15 -- --    METs -- 2.53 -- --      Recumbant Bike   Level -- -- 1.9 1.3    Watts -- -- 20 20    Minutes -- -- 15 15    METs -- -- 2.8 2.58      NuStep   Level -- 2 3 4     Minutes -- 15 15 15     METs -- 2.1 2.6 3      REL-XR   Level -- 1 -- --    Minutes -- 15 -- --    METs -- 3.2 -- --      Oxygen   Maintain Oxygen Saturation -- 88% or higher 88% or higher 88% or higher       Exercise Comments:   Exercise Comments     Row Name 04/10/24 1540           Exercise Comments First full day of exercise!  Patient was oriented to gym and equipment including functions, settings, policies, and procedures.  Patient's individual exercise prescription and treatment plan were reviewed.  All starting workloads were established based on the results of the 6 minute walk test done at initial orientation visit.  The plan for exercise progression was also introduced and progression will be customized based on patient's performance and goals.          Exercise Goals and Review:   Exercise Goals  Row Name 04/03/24 1151             Exercise Goals   Increase Physical Activity Yes       Intervention Provide advice, education, support and counseling about physical activity/exercise needs.;Develop an individualized exercise prescription for aerobic and resistive training based on initial evaluation findings, risk stratification, comorbidities and participant's personal goals.       Expected Outcomes Short Term: Attend rehab on a regular basis to increase amount of physical activity.;Long Term: Add in home exercise to make exercise part of routine and to increase amount of physical activity.;Long Term: Exercising regularly at least 3-5 days a week.       Increase Strength and Stamina Yes       Intervention Provide advice, education, support and counseling about physical activity/exercise needs.;Develop an individualized exercise prescription  for aerobic and resistive training based on initial evaluation findings, risk stratification, comorbidities and participant's personal goals.       Expected Outcomes Short Term: Increase workloads from initial exercise prescription for resistance, speed, and METs.;Short Term: Perform resistance training exercises routinely during rehab and add in resistance training at home;Long Term: Improve cardiorespiratory fitness, muscular endurance and strength as measured by increased METs and functional capacity ( )       Able to understand and use rate of perceived exertion (RPE) scale Yes       Intervention Provide education and explanation on how to use RPE scale       Expected Outcomes Short Term: Able to use RPE daily in rehab to express subjective intensity level;Long Term:  Able to use RPE to guide intensity level when exercising independently       Able to understand and use Dyspnea scale Yes       Intervention Provide education and explanation on how to use Dyspnea scale       Expected Outcomes Short Term: Able to use Dyspnea scale daily in rehab to express subjective sense of shortness of breath during exertion;Long Term: Able to use Dyspnea scale to guide intensity level when exercising independently       Knowledge and understanding of Target Heart Rate Range (THRR) Yes       Intervention Provide education and explanation of THRR including how the numbers were predicted and where they are located for reference       Expected Outcomes Short Term: Able to state/look up THRR;Long Term: Able to use THRR to govern intensity when exercising independently;Short Term: Able to use daily as guideline for intensity in rehab       Able to check pulse independently Yes       Intervention Provide education and demonstration on how to check pulse in carotid and radial arteries.;Review the importance of being able to check your own pulse for safety during independent exercise       Expected Outcomes Short Term:  Able to explain why pulse checking is important during independent exercise;Long Term: Able to check pulse independently and accurately       Understanding of Exercise Prescription Yes       Intervention Provide education, explanation, and written materials on patient's individual exercise prescription       Expected Outcomes Short Term: Able to explain program exercise prescription;Long Term: Able to explain home exercise prescription to exercise independently          Exercise Goals Re-Evaluation :  Exercise Goals Re-Evaluation     Row Name 04/10/24 1540 04/18/24 0846 05/02/24 1340 05/16/24 1359  Exercise Goal Re-Evaluation   Exercise Goals Review Increase Physical Activity;Able to understand and use rate of perceived exertion (RPE) scale;Knowledge and understanding of Target Heart Rate Range (THRR);Understanding of Exercise Prescription;Increase Strength and Stamina;Able to understand and use Dyspnea scale;Able to check pulse independently Increase Physical Activity;Increase Strength and Stamina;Understanding of Exercise Prescription Increase Physical Activity;Increase Strength and Stamina;Understanding of Exercise Prescription --    Comments Reviewed RPE and dyspnea scale, THR and program prescription with pt today.  Pt voiced understanding and was given a copy of goals to take home. Shaletha is off to a good start in the program, and was able to attend her first 2 sessions during this review period. During those sessions she was able to use the treadmill at a speed of and no incline, and use the T4 nustep at level 2. We will continue to monitor her progress in the program. Harlei has only attended two sessions since the last review. She recently improved to level 3 on the T4 nustep and level 1.9 on the recumbent bike. We will continue to monitor her progress in the program. Loreen has only attended one session since the last review. She was able to increase to level 4 on the T4 nustep. She  also continued to use 3 lb handweights for resistance training. We will continue to monitor her progress in the program.    Expected Outcomes Short: Use RPE daily to regulate intensity.  Long: Follow program prescription in THR. Short: Continue to follow exercise prescription. Long: Continue exercise to improve strength and stamina. Short: Attend rehab more consistently. Long: Continue exercise to improve strength and stamina. Short: Attend rehab more consistently. Long: Continue exercise to improve strength and stamina.       Discharge Exercise Prescription (Final Exercise Prescription Changes):  Exercise Prescription Changes - 05/16/24 1300       Response to Exercise   Blood Pressure (Admit) 102/68    Blood Pressure (Exercise) 118/58    Blood Pressure (Exit) 102/62    Heart Rate (Admit) 69 bpm    Heart Rate (Exercise) 100 bpm    Heart Rate (Exit) 103 bpm    Rating of Perceived Exertion (Exercise) 15    Symptoms none    Duration Continue with 30 min of aerobic exercise without signs/symptoms of physical distress.    Intensity THRR unchanged      Progression   Progression Continue to progress workloads to maintain intensity without signs/symptoms of physical distress.    Average METs 2.79      Resistance Training   Training Prescription Yes    Weight 3 lb    Reps 10-15      Interval Training   Interval Training No      Recumbant Bike   Level 1.3    Watts 20    Minutes 15    METs 2.58      NuStep   Level 4    Minutes 15    METs 3      Oxygen   Maintain Oxygen Saturation 88% or higher          Nutrition:  Target Goals: Understanding of nutrition guidelines, daily intake of sodium 1500mg , cholesterol 200mg , calories 30% from fat and 7% or less from saturated fats, daily to have 5 or more servings of fruits and vegetables.  Education: Nutrition 1 -Group instruction provided by verbal, written material, interactive activities, discussions, models, and posters to  present general guidelines for heart healthy nutrition including macronutrients, label reading,  and promoting whole foods over processed counterparts. Education serves as pensions consultant of discussion of heart healthy eating for all. Written material provided at class time.    Education: Nutrition 2 -Group instruction provided by verbal, written material, interactive activities, discussions, models, and posters to present general guidelines for heart healthy nutrition including sodium, cholesterol, and saturated fat. Providing guidance of habit forming to improve blood pressure, cholesterol, and body weight. Written material provided at class time.     Biometrics:  Pre Biometrics - 04/03/24 1153       Pre Biometrics   Height 5' 4.5 (1.638 m)    Weight 241 lb 3.2 oz (109.4 kg)    Waist Circumference 44.5 inches    Hip Circumference 50 inches    Waist to Hip Ratio 0.89 %    BMI (Calculated) 40.78    Single Leg Stand 3.33 seconds           Nutrition Therapy Plan and Nutrition Goals:  Nutrition Therapy & Goals - 04/03/24 1337       Nutrition Therapy   Diet Cardiac, Low Na    Protein (specify units) 80    Fiber 25 grams    Whole Grain Foods 3 servings    Saturated Fats 15 max. grams    Fruits and Vegetables 5 servings/day    Sodium 2 grams      Personal Nutrition Goals   Nutrition Goal Eat 3 times per day, small frequent meals or nutrient dense snacks    Personal Goal #2 Read labels and reduce sodium intake to below 2300mg . Ideally 1500mg  per day.    Personal Goal #3 Reduce saturated fat, less than 12g per day. Replace bad fats for more heart healthy fats.    Comments Patient drinking mostly fruit juice with some water . She knows the juice is not good for her, set goal to work on cutting back on juice and increasing water  intake. She is on wegovy  and reports she often misses lunch. Spoke with her about eating 3 times per day, using nutrient dense snacks. Brainstormed several small  and snack ideas. Reviewed mediterranean diet handout. Educated on types of fats, sources and how to read labels. Provided guideline limits of less than 1500mg  sodium and less than 12g saturated fat per day      Intervention Plan   Intervention Prescribe, educate and counsel regarding individualized specific dietary modifications aiming towards targeted core components such as weight, hypertension, lipid management, diabetes, heart failure and other comorbidities.;Nutrition handout(s) given to patient.    Expected Outcomes Short Term Goal: Understand basic principles of dietary content, such as calories, fat, sodium, cholesterol and nutrients.;Short Term Goal: A plan has been developed with personal nutrition goals set during dietitian appointment.;Long Term Goal: Adherence to prescribed nutrition plan.          Nutrition Assessments:  MEDIFICTS Score Key: >=70 Need to make dietary changes  40-70 Heart Healthy Diet <= 40 Therapeutic Level Cholesterol Diet  Flowsheet Row Cardiac Rehab from 04/10/2024 in [facility] Cardiac and Pulmonary Rehab  Picture Your Plate Total Score on Admission 38   Picture Your Plate Scores: <59 Unhealthy dietary pattern with much room for improvement. 41-50 Dietary pattern unlikely to meet recommendations for good health and room for improvement. 51-60 More healthful dietary pattern, with some room for improvement.  >60 Healthy dietary pattern, although there may be some specific behaviors that could be improved.    Nutrition Goals Re-Evaluation:  Nutrition Goals Re-Evaluation     Row Name  04/20/24 1550 05/15/24 1537           Goals   Current Weight -- 241 lb (109.3 kg)      Comment Gelena recently spoke with the RD about how she can improve her nutrition. She has began to drink more water  throughout the day. She also has been working on reducing her sodium intake by not adding salt at the table. She also states that she has been paying more attention to food  labels. Yahayra has tried to reduce sodium and is still trying. She is drinking enough water  and has no problems with hydration.      Expected Outcome Short: Continue to reduce sodium intake. Long: Continue to practice heart healthy eating patterns as discussed with RD. Short: reduce sodium further. Long: Adhere to a low sodium diet.         Nutrition Goals Discharge (Final Nutrition Goals Re-Evaluation):  Nutrition Goals Re-Evaluation - 05/15/24 1537       Goals   Current Weight 241 lb (109.3 kg)    Comment Rhea has tried to reduce sodium and is still trying. She is drinking enough water  and has no problems with hydration.    Expected Outcome Short: reduce sodium further. Long: Adhere to a low sodium diet.          Psychosocial: Target Goals: Acknowledge presence or absence of significant depression and/or stress, maximize coping skills, provide positive support system. Participant is able to verbalize types and ability to use techniques and skills needed for reducing stress and depression.   Education: Stress, Anxiety, and Depression - Group verbal and visual presentation to define topics covered.  Reviews how body is impacted by stress, anxiety, and depression.  Also discusses healthy ways to reduce stress and to treat/manage anxiety and depression. Written material provided at class time.   Education: Sleep Hygiene -Provides group verbal and written instruction about how sleep can affect your health.  Define sleep hygiene, discuss sleep cycles and impact of sleep habits. Review good sleep hygiene tips.   Initial Review & Psychosocial Screening:  Initial Psych Review & Screening - 04/03/24 0948       Initial Review   Current issues with Current Stress Concerns    Source of Stress Concerns Family;Chronic Illness      Family Dynamics   Good Support System? Yes      Barriers   Psychosocial barriers to participate in program There are no identifiable barriers or psychosocial  needs.;The patient should benefit from training in stress management and relaxation.      Screening Interventions   Interventions Encouraged to exercise;Provide feedback about the scores to participant;To provide support and resources with identified psychosocial needs    Expected Outcomes Short Term goal: Utilizing psychosocial counselor, staff and physician to assist with identification of specific Stressors or current issues interfering with healing process. Setting desired goal for each stressor or current issue identified.;Long Term Goal: Stressors or current issues are controlled or eliminated.;Short Term goal: Identification and review with participant of any Quality of Life or Depression concerns found by scoring the questionnaire.;Long Term goal: The participant improves quality of Life and PHQ9 Scores as seen by post scores and/or verbalization of changes          Quality of Life Scores:   Quality of Life - 04/03/24 1143       Quality of Life   Select Quality of Life      Quality of Life Scores   Health/Function Pre  21.1 %    Socioeconomic Pre 26.94 %    Psych/Spiritual Pre 24.86 %    Family Pre 17.7 %    GLOBAL Pre 22.7 %         Scores of 19 and below usually indicate a poorer quality of life in these areas.  A difference of  2-3 points is a clinically meaningful difference.  A difference of 2-3 points in the total score of the Quality of Life Index has been associated with significant improvement in overall quality of life, self-image, physical symptoms, and general health in studies assessing change in quality of life.  PHQ-9: Review Flowsheet  More data exists      05/15/2024 04/20/2024 04/11/2024 04/03/2024 01/27/2024  Depression screen PHQ 2/9  Decreased Interest 1 1 2 1 1   Down, Depressed, Hopeless 0 0 1 0 0  PHQ - 2 Score 1 1 3 1 1   Altered sleeping 3 3 1 1 1   Tired, decreased energy 3 3 3 2 2   Change in appetite 0 0 1 1 2   Feeling bad or failure about  yourself  0 0 0 0 0  Trouble concentrating 1 1 1 3 2   Moving slowly or fidgety/restless 0 0 0 1 0  Suicidal thoughts 0 0 0 0 0  PHQ-9 Score 8 8 9  9  8    Difficult doing work/chores Somewhat difficult Somewhat difficult Somewhat difficult Somewhat difficult Somewhat difficult    Details       Data saved with a previous flowsheet row definition        Interpretation of Total Score  Total Score Depression Severity:  1-4 = Minimal depression, 5-9 = Mild depression, 10-14 = Moderate depression, 15-19 = Moderately severe depression, 20-27 = Severe depression   Psychosocial Evaluation and Intervention:  Psychosocial Evaluation - 04/03/24 1014       Psychosocial Evaluation & Interventions   Comments Ms. Solly is coming to cardiac rehab post stents. She is still having some chest discomfort at times and feels more tired than before her stent. She is in contact with her doctor and thinks it may be some of the medicaiton she is on. She is hopeful that becoming more active will help with her symptoms some. She is interested in quitting smoking, but it is her destressor so she wants to find something that will help with her stress. 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 She helps her sister and mentions it is a high stress environment. She states she knows she needs to focus on her own health after all this time of helping others, and is ready to do so. Resources given for local mental health counselors. She works full time as a producer, television/film/video. She is ready to commit to the program    Expected Outcomes Short: attend cardiac rehab for education and exercise Long: develop and maintain positive self care habits    Continue Psychosocial Services  Follow up required by staff          Psychosocial Re-Evaluation:  Psychosocial Re-Evaluation     Row Name 04/20/24 1554 05/15/24 1540            Psychosocial Re-Evaluation   Current issues with Current Stress Concerns;Current Sleep Concerns Current Psychotropic Meds;Current Stress Concerns      Comments We reviewed PHQ-9 questionairre with pt today as her  previous scores had shown signs of depression. Her score improved slightly, but still shows symptoms of depression. She reports that her main stressor is her lack of sleep as she has a hard time falling asleep and wakes up throughout the night. Her sleep concerns have caused her to have less energy and she has felt fatigued. She has spoken with her doctor about her sleep concerns, and will go for a sleep study later this month. Reviewed patient health questionnaire (PHQ-9) with patient for follow up. Previously, patients score indicated signs/symptoms of depression.  Reviewed to see if patient is improving symptom wise while in program.  Score stayed the same and patient states that it is because she is still working on her health.      Expected Outcomes Short: Participate in sleep study. Long: Continue to attend rehab for mental boost. Short: Continue to attend [facility] regularly for regular exercise and social engagement. Long: Continue to improve symptoms and manage a positive mental state.      Interventions Encouraged to attend Cardiac Rehabilitation for the exercise Encouraged to attend Cardiac Rehabilitation for the exercise      Continue Psychosocial Services  Follow up required by staff Follow up required by staff        Initial Review   Comments Sleep Concerns are her major stressor --         Psychosocial Discharge (Final Psychosocial Re-Evaluation):  Psychosocial Re-Evaluation - 05/15/24 1540       Psychosocial Re-Evaluation   Current issues with Current Psychotropic Meds;Current Stress Concerns    Comments Reviewed patient health questionnaire (PHQ-9) with patient for follow up. Previously, patients score indicated signs/symptoms of depression.  Reviewed to see if  patient is improving symptom wise while in program.  Score stayed the same and patient states that it is because she is still working on her health.    Expected Outcomes Short: Continue to attend [facility] regularly for regular exercise and social engagement. Long: Continue to improve symptoms and manage a positive mental state.    Interventions Encouraged to attend Cardiac Rehabilitation for the exercise    Continue Psychosocial Services  Follow up required by staff          Vocational Rehabilitation: Provide vocational rehab assistance to qualifying candidates.   Vocational Rehab Evaluation & Intervention:  Vocational Rehab - 04/03/24 0947       Initial Vocational Rehab Evaluation & Intervention   Assessment shows need for Vocational Rehabilitation No          Education: Education Goals: Education classes will be provided on a variety of topics geared toward better understanding of heart health and risk factor modification. Participant will state understanding/return demonstration of topics presented as noted by education test scores.  Learning Barriers/Preferences:  Learning Barriers/Preferences - 04/03/24 0947       Learning Barriers/Preferences   Learning Barriers None    Learning Preferences Individual Instruction;Skilled Demonstration;Video          General Cardiac Education Topics:  AED/CPR: - Group verbal and written instruction with the use of models to demonstrate the basic use of the AED with the basic ABC's of resuscitation.   Test and Procedures: - Group verbal and visual presentation and models provide information about basic cardiac anatomy and function. Reviews the testing methods done to diagnose heart disease and the outcomes of the test results. Describes the treatment choices: Medical Management, Angioplasty, or Coronary Bypass Surgery for treating various heart conditions including Myocardial Infarction, Angina, Valve Disease,  and Cardiac  Arrhythmias. Written material provided at class time.   Medication Safety: - Group verbal and visual instruction to review commonly prescribed medications for heart and lung disease. Reviews the medication, class of the drug, and side effects. Includes the steps to properly store meds and maintain the prescription regimen. Written material provided at class time.   Intimacy: - Group verbal instruction through game format to discuss how heart and lung disease can affect sexual intimacy. Written material provided at class time.   Know Your Numbers and Heart Failure: - Group verbal and visual instruction to discuss disease risk factors for cardiac and pulmonary disease and treatment options.  Reviews associated critical values for Overweight/Obesity, Hypertension, Cholesterol, and Diabetes.  Discusses basics of heart failure: signs/symptoms and treatments.  Introduces Heart Failure Zone chart for action plan for heart failure. Written material provided at class time.   Infection Prevention: - Provides verbal and written material to individual with discussion of infection control including proper hand washing and proper equipment cleaning during exercise session.   Falls Prevention: - Provides verbal and written material to individual with discussion of falls prevention and safety.   Other: -Provides group and verbal instruction on various topics (see comments)   Knowledge Questionnaire Score:  Knowledge Questionnaire Score - 04/03/24 1143       Knowledge Questionnaire Score   Pre Score 22/26          Core Components/Risk Factors/Patient Goals at Admission:  Personal Goals and Risk Factors at Admission - 04/03/24 0943       Core Components/Risk Factors/Patient Goals on Admission    Weight Management Yes;Weight Loss    Intervention Weight Management: Develop a combined nutrition and exercise program designed to reach desired caloric intake, while maintaining appropriate intake  of nutrient and fiber, sodium and fats, and appropriate energy expenditure required for the weight goal.;Weight Management: Provide education and appropriate resources to help participant work on and attain dietary goals.;Weight Management/Obesity: Establish reasonable short term and long term weight goals.;Obesity: Provide education and appropriate resources to help participant work on and attain dietary goals.    Goal Weight: Long Term 160 lb (72.6 kg)    Expected Outcomes Short Term: Continue to assess and modify interventions until short term weight is achieved;Weight Loss: Understanding of general recommendations for a balanced deficit meal plan, which promotes 1-2 lb weight loss per week and includes a negative energy balance of [phone] kcal/d;Understanding recommendations for meals to include 15-35% energy as protein, 25-35% energy from fat, 35-60% energy from carbohydrates, less than 200mg  of dietary cholesterol, 20-35 gm of total fiber daily;Understanding of distribution of calorie intake throughout the day with the consumption of 4-5 meals/snacks    Tobacco Cessation Yes    Number of packs per day 1 ppd    Intervention Offer self-teaching materials, assist with locating and accessing local/national Quit Smoking programs, and support quit date choice.;Assist the participant in steps to quit. Provide individualized education and counseling about committing to Tobacco Cessation, relapse prevention, and pharmacological support that can be provided by physician.    Expected Outcomes Short Term: Will demonstrate readiness to quit, by selecting a quit date.;Long Term: Complete abstinence from all tobacco products for at least 12 months from quit date.;Short Term: Will quit all tobacco product use, adhering to prevention of relapse plan.    Hypertension Yes    Intervention Provide education on lifestyle modifcations including regular physical activity/exercise, weight management, moderate sodium  restriction and increased consumption of fresh fruit,  vegetables, and low fat dairy, alcohol moderation, and smoking cessation.;Monitor prescription use compliance.    Expected Outcomes Short Term: Continued assessment and intervention until BP is < 140/17mm HG in hypertensive participants. < 130/39mm HG in hypertensive participants with diabetes, heart failure or chronic kidney disease.;Long Term: Maintenance of blood pressure at goal levels.    Lipids Yes    Intervention Provide education and support for participant on nutrition & aerobic/resistive exercise along with prescribed medications to achieve LDL 70mg , HDL >40mg .    Expected Outcomes Short Term: Participant states understanding of desired cholesterol values and is compliant with medications prescribed. Participant is following exercise prescription and nutrition guidelines.;Long Term: Cholesterol controlled with medications as prescribed, with individualized exercise RX and with personalized nutrition plan. Value goals: LDL < 70mg , HDL > 40 mg.          Education:Diabetes - Individual verbal and written instruction to review signs/symptoms of diabetes, desired ranges of glucose level fasting, after meals and with exercise. Acknowledge that pre and post exercise glucose checks will be done for 3 sessions at entry of program.   Core Components/Risk Factors/Patient Goals Review:   Goals and Risk Factor Review     Row Name 04/20/24 1558 05/15/24 1534           Core Components/Risk Factors/Patient Goals Review   Personal Goals Review Weight Management/Obesity;Tobacco Cessation;Hypertension Tobacco Cessation;Weight Management/Obesity      Review Nohealani states that she is still working towards weight loss, and has a long term goal to get down to 180 lbs. She weighed in today at 239.9 lbs. She is also working on cutting back on her smoking. She is down to 1/2 a pack of cigarettes per day, as she was previously doing 1 pack per day. She  states that she does own a BP cuff, but has not been checking her readings routinely at home. We encouraged her to begin to check her BP at home to make sure her readings stay within normal ranges. She continues to take all of her medications as prescribed. Johnnie stated that she quit smoking and has not had one on a week. She just had enough and listened to her kids and decided it was time to stop. She is working on her weight and states some swelling. She is taking lasix  as needed and watches her weight daily.      Expected Outcomes Short: Continue to cut back on smoking. Long: Continue to manage lifestyle risk factors. Short: continue to be tobacco free and reduce weight. Long: reach weight goal.         Core Components/Risk Factors/Patient Goals at Discharge (Final Review):   Goals and Risk Factor Review - 05/15/24 1534       Core Components/Risk Factors/Patient Goals Review   Personal Goals Review Tobacco Cessation;Weight Management/Obesity    Review Lakeyshia stated that she quit smoking and has not had one on a week. She just had enough and listened to her kids and decided it was time to stop. She is working on her weight and states some swelling. She is taking lasix  as needed and watches her weight daily.    Expected Outcomes Short: continue to be tobacco free and reduce weight. Long: reach weight goal.          ITP Comments:  ITP Comments     Row Name 04/03/24 1011 04/10/24 1540 04/26/24 1305 05/24/24 0934     ITP Comments Completed initial orientation and for cardiac rehab. Initial ITP  created and sent for review to Dr. Oneil Pinal, Medical Director. First full day of exercise!  Patient was oriented to gym and equipment including functions, settings, policies, and procedures.  Patient's individual exercise prescription and treatment plan were reviewed.  All starting workloads were established based on the results of the 6 minute walk test done at initial orientation visit.  The plan  for exercise progression was also introduced and progression will be customized based on patient's performance and goals. 30 Day review completed. Medical Director ITP review done, changes made as directed, and signed approval by Medical Director. new to program 30 Day review completed. Medical Director ITP review done, changes made as directed, and signed approval by Medical Director.       Comments: 30 day review     [1]  Current Outpatient Medications:    albuterol  (VENTOLIN  HFA) 108 (90 Base) MCG/ACT inhaler, Inhale 1-2 puffs into the lungs every 6 (six) hours as needed for wheezing or shortness of breath., Disp: 18 each, Rfl: 1   aluminum -magnesium  hydroxide-simethicone  (MAALOX) 200-200-20 MG/5ML SUSP, Take 30 mLs by mouth 4 (four) times daily -  before meals and at bedtime., Disp: 1680 mL, Rfl: 0   amLODipine  (NORVASC ) 10 MG tablet, Take 1 tablet (10 mg total) by mouth daily., Disp: 90 tablet, Rfl: 1   aspirin  EC 81 MG tablet, Take 1 tablet (81 mg total) by mouth daily. Swallow whole., Disp: 30 tablet, Rfl: 12   atenolol  (TENORMIN ) 50 MG tablet, Take 1 tablet (50 mg total) by mouth daily., Disp: 90 tablet, Rfl: 1   budesonide -glycopyrrolate -formoterol  (BREZTRI  AEROSPHERE) 160-9-4.8 MCG/ACT AERO inhaler, Inhale 2 puffs into the lungs 2 (two) times daily., Disp: , Rfl:    busPIRone  (BUSPAR ) 15 MG tablet, Take 1 tablet (15 mg total) by mouth 3 (three) times daily., Disp: 270 tablet, Rfl: 1   clonazePAM  (KLONOPIN ) 1 MG tablet, Take 1 tablet (1 mg total) by mouth 2 (two) times daily as needed. for anxiety, Disp: 60 tablet, Rfl: 0   doxycycline  (VIBRA -TABS) 100 MG tablet, Take 1 tablet (100 mg total) by mouth 2 (two) times daily., Disp: 20 tablet, Rfl: 0   Evolocumab  (REPATHA  SURECLICK) 140 MG/ML SOAJ, INJECT 140 MG INTO THE SKIN EVERY 14 (FOURTEEN) DAYS., Disp: 6 mL, Rfl: 3   ezetimibe  (ZETIA ) 10 MG tablet, Take 1 tablet (10 mg total) by mouth daily., Disp: 90 tablet, Rfl: 1   FLUoxetine   (PROZAC ) 40 MG capsule, Take 2 capsules (80 mg total) by mouth daily., Disp: 180 capsule, Rfl: 1   furosemide  (LASIX ) 20 MG tablet, TAKE 1 TABLET BY MOUTH ONCE DAILY AS NEEDED FOR SWELLING, Disp: 60 tablet, Rfl: 3   hydrOXYzine  (ATARAX ) 25 MG tablet, Take 1 tablet (25 mg total) by mouth 3 (three) times daily as needed., Disp: 270 tablet, Rfl: 1   isosorbide  mononitrate (IMDUR ) 60 MG 24 hr tablet, Take 1 tablet (60 mg total) by mouth daily., Disp: 90 tablet, Rfl: 1   losartan  (COZAAR ) 100 MG tablet, Take 1 tablet (100 mg total) by mouth daily., Disp: 90 tablet, Rfl: 1   nicotine  (NICODERM CQ  - DOSED IN MG/24 HOURS) 21 mg/24hr patch, Place 1 patch (21 mg total) onto the skin daily., Disp: 28 patch, Rfl: 1   ondansetron  (ZOFRAN ) 4 MG tablet, Take 1 tablet (4 mg total) by mouth every 8 (eight) hours as needed for nausea or vomiting., Disp: 60 tablet, Rfl: 3   pantoprazole  (PROTONIX ) 40 MG tablet, Take 1 tablet (40 mg total)  by mouth 2 (two) times daily., Disp: 180 tablet, Rfl: 1   prasugrel  (EFFIENT ) 10 MG TABS tablet, Take 1 tablet (10 mg total) by mouth daily., Disp: 30 tablet, Rfl: 0   predniSONE  (DELTASONE ) 10 MG tablet, 6 tabs in the AM days 1 and 2, 5 pills days 3 and 4, decrease by 1 every other day until gone., Disp: 42 tablet, Rfl: 0   QUEtiapine  Fumarate (SEROQUEL  XR) 150 MG 24 hr tablet, Take 1 tablet (150 mg total) by mouth at bedtime., Disp: 90 tablet, Rfl: 0   ranolazine  (RANEXA ) 1000 MG SR tablet, Take 1 tablet (1,000 mg total) by mouth 2 (two) times daily., Disp: 60 tablet, Rfl: 2   semaglutide -weight management (WEGOVY ) 0.25 MG/0.5ML SOAJ SQ injection, Inject 0.25 mg into the skin once a week., Disp: 2 mL, Rfl: 0   [START ON 06/20/2024] semaglutide -weight management (WEGOVY ) 0.5 MG/0.5ML SOAJ SQ injection, Inject 0.5 mg into the skin once a week., Disp: 2 mL, Rfl: 1   [START ON 07/18/2024] semaglutide -weight management (WEGOVY ) 1 MG/0.5ML SOAJ SQ injection, Inject 1 mg into the skin once a  week., Disp: 2 mL, Rfl: 1   tiZANidine  (ZANAFLEX ) 4 MG tablet, Take 1 tablet (4 mg total) by mouth every 8 (eight) hours as needed for muscle spasms., Disp: 90 tablet, Rfl: 1   Vitamin D , Ergocalciferol , (DRISDOL ) 1.25 MG (50000 UNIT) CAPS capsule, TAKE 1 CAPSULE BY MOUTH ONE TIME PER WEEK, Disp: 12 capsule, Rfl: 0 [2]  Social History Tobacco Use  Smoking Status Every Day   Current packs/day: 1.50   Average packs/day: 1.5 packs/day for 41.0 years (61.5 ttl pk-yrs)   Types: Cigarettes  Smokeless Tobacco Never  Tobacco Comments   since age 82

## 2024-05-25 ENCOUNTER — Ambulatory Visit: Payer: Self-pay | Admitting: Family Medicine

## 2024-05-25 ENCOUNTER — Encounter: Payer: Self-pay | Admitting: Family Medicine

## 2024-05-25 ENCOUNTER — Encounter

## 2024-05-25 DIAGNOSIS — Z955 Presence of coronary angioplasty implant and graft: Secondary | ICD-10-CM | POA: Diagnosis not present

## 2024-05-25 NOTE — Assessment & Plan Note (Signed)
 Under good control on current regimen. Continue current regimen. Continue to monitor. Call with any concerns. Refills given. Labs drawn today.

## 2024-05-25 NOTE — Assessment & Plan Note (Addendum)
 Continue to follow with cardiology. Will keep BP and cholesterol under good control. Continue to follow with cardiology. Continue wegovy  for cardioprotection. Continue to monitor closely.

## 2024-05-25 NOTE — Assessment & Plan Note (Signed)
 Rechecking labs today. Await results. Treat as needed.

## 2024-05-25 NOTE — Assessment & Plan Note (Signed)
 Will keep BP and cholesterol under good control. Continue to follow with cardiology. Continue wegovy  for cardioprotection. Continue to monitor closely.

## 2024-05-25 NOTE — Progress Notes (Signed)
 Daily Session Note  Patient Details  Name: Hannah Kaiser MRN: [id number] Date of Birth: [birth year] Referring Provider:   Flowsheet Row Cardiac Rehab from 04/03/2024 in [facility] Cardiac and Pulmonary Rehab  Referring Provider Dr. Denyse Bathe, MD    Encounter Date: 05/25/2024  Check In:  Session Check In - 05/25/24 1550       Check-In   Supervising physician immediately available to respond to emergencies See telemetry face sheet for immediately available ER MD    Location [facility]-Cardiac & Pulmonary Rehab    Staff Present Maxon Conetta BS, Exercise Physiologist;Joseph Hood RCP,RRT,BSRT;Roy Snuffer RN,BSN;Noah Tickle, BS, Exercise Physiologist    Virtual Visit No    Medication changes reported     No    Fall or balance concerns reported    No    Tobacco Cessation No Change    Warm-up and Cool-down Performed on first and last piece of equipment    Resistance Training Performed Yes    VAD Patient? No    PAD/SET Patient? No      Pain Assessment   Currently in Pain? No/denies             Tobacco Use History[1]  Goals Met:  Independence with exercise equipment Exercise tolerated well No report of concerns or symptoms today Strength training completed today  Goals Unmet:  Not Applicable  Comments: Pt able to follow exercise prescription today without complaint.  Will continue to monitor for progression.    Dr. Oneil Pinal is Medical Director for [facility] Cardiac Rehabilitation.  Dr. Fuad Aleskerov is Medical Director for [facility] Pulmonary Rehabilitation.    [1]  Social History Tobacco Use  Smoking Status Every Day   Current packs/day: 1.50   Average packs/day: 1.5 packs/day for 41.0 years (61.5 ttl pk-yrs)   Types: Cigarettes  Smokeless Tobacco Never  Tobacco Comments   since age 44

## 2024-05-26 ENCOUNTER — Other Ambulatory Visit

## 2024-05-26 DIAGNOSIS — I340 Nonrheumatic mitral (valve) insufficiency: Secondary | ICD-10-CM

## 2024-05-26 DIAGNOSIS — I5033 Acute on chronic diastolic (congestive) heart failure: Secondary | ICD-10-CM

## 2024-05-26 DIAGNOSIS — I2510 Atherosclerotic heart disease of native coronary artery without angina pectoris: Secondary | ICD-10-CM

## 2024-05-26 DIAGNOSIS — I371 Nonrheumatic pulmonary valve insufficiency: Secondary | ICD-10-CM

## 2024-05-26 DIAGNOSIS — R0602 Shortness of breath: Secondary | ICD-10-CM

## 2024-05-26 DIAGNOSIS — R0789 Other chest pain: Secondary | ICD-10-CM

## 2024-05-26 DIAGNOSIS — I361 Nonrheumatic tricuspid (valve) insufficiency: Secondary | ICD-10-CM | POA: Diagnosis not present

## 2024-05-26 DIAGNOSIS — F17210 Nicotine dependence, cigarettes, uncomplicated: Secondary | ICD-10-CM

## 2024-05-29 ENCOUNTER — Encounter

## 2024-05-29 DIAGNOSIS — Z955 Presence of coronary angioplasty implant and graft: Secondary | ICD-10-CM | POA: Diagnosis not present

## 2024-05-29 NOTE — Progress Notes (Signed)
 Daily Session Note  Patient Details  Name: Hannah Kaiser MRN: [id number] Date of Birth: 1960-11-26 Referring Provider:   Flowsheet Row Cardiac Rehab from 04/03/2024 in [facility] Cardiac and Pulmonary Rehab  Referring Provider Dr. Denyse Bathe, MD    Encounter Date: 05/29/2024  Check In:  Session Check In - 05/29/24 1552       Check-In   Supervising physician immediately available to respond to emergencies See telemetry face sheet for immediately available ER MD    Staff Present Leita Franks RN,BSN;Kristen Coble RN,BC,MSN;Margaret Best, MS, Exercise Physiologist;Meredith Tressa RN,BSN    Virtual Visit No    Medication changes reported     No    Fall or balance concerns reported    No    Tobacco Cessation No Change    Warm-up and Cool-down Performed on first and last piece of equipment    Resistance Training Performed Yes    VAD Patient? No    PAD/SET Patient? No      Pain Assessment   Currently in Pain? No/denies             Tobacco Use History[1]  Goals Met:  Independence with exercise equipment Exercise tolerated well No report of concerns or symptoms today Strength training completed today  Goals Unmet:  Not Applicable  Comments: Pt able to follow exercise prescription today without complaint.  Will continue to monitor for progression.    Dr. Oneil Pinal is Medical Director for [facility] Cardiac Rehabilitation.  Dr. Fuad Aleskerov is Medical Director for [facility] Pulmonary Rehabilitation.    [1]  Social History Tobacco Use  Smoking Status Every Day   Current packs/day: 1.50   Average packs/day: 1.5 packs/day for 41.0 years (61.5 ttl pk-yrs)   Types: Cigarettes  Smokeless Tobacco Never  Tobacco Comments   since age 25

## 2024-05-30 ENCOUNTER — Ambulatory Visit: Admitting: Cardiovascular Disease

## 2024-05-30 ENCOUNTER — Encounter: Payer: Self-pay | Admitting: Cardiovascular Disease

## 2024-05-30 VITALS — BP 117/69 | HR 77 | Ht 64.0 in | Wt 242.2 lb

## 2024-05-30 DIAGNOSIS — I25118 Atherosclerotic heart disease of native coronary artery with other forms of angina pectoris: Secondary | ICD-10-CM | POA: Diagnosis not present

## 2024-05-30 DIAGNOSIS — D509 Iron deficiency anemia, unspecified: Secondary | ICD-10-CM

## 2024-05-30 DIAGNOSIS — R0602 Shortness of breath: Secondary | ICD-10-CM

## 2024-05-30 DIAGNOSIS — I2510 Atherosclerotic heart disease of native coronary artery without angina pectoris: Secondary | ICD-10-CM

## 2024-05-30 DIAGNOSIS — I5031 Acute diastolic (congestive) heart failure: Secondary | ICD-10-CM

## 2024-05-30 DIAGNOSIS — E782 Mixed hyperlipidemia: Secondary | ICD-10-CM | POA: Diagnosis not present

## 2024-05-30 DIAGNOSIS — Z9861 Coronary angioplasty status: Secondary | ICD-10-CM

## 2024-05-30 DIAGNOSIS — F17210 Nicotine dependence, cigarettes, uncomplicated: Secondary | ICD-10-CM

## 2024-05-30 DIAGNOSIS — Z0130 Encounter for examination of blood pressure without abnormal findings: Secondary | ICD-10-CM

## 2024-05-30 DIAGNOSIS — I5033 Acute on chronic diastolic (congestive) heart failure: Secondary | ICD-10-CM

## 2024-05-30 DIAGNOSIS — R0789 Other chest pain: Secondary | ICD-10-CM

## 2024-05-30 MED ORDER — DAPAGLIFLOZIN PROPANEDIOL 10 MG PO TABS: 10.0000 mg | ORAL_TABLET | Freq: Every day | ORAL | 3 refills | Status: DC

## 2024-05-30 NOTE — Progress Notes (Signed)
 "     Cardiology Office Note   Date:  05/30/2024   ID:  Hannah Kaiser, DOB 1960-07-01, MRN [id number]  PCP:  Vicci Duwaine SQUIBB, DO  Cardiologist:  Denyse Bathe, MD      History of Present Illness: Hannah Kaiser is a 63 y.o. female who presents for  Chief Complaint  Patient presents with   Follow-up    Echo & NST follow up. SOB    Still has chest pain and SOB. Its not getting better.      Past Medical History:  Diagnosis Date   Anemia    Anxiety    Arthritis    knees, thoracic spurs and arthritis   Asthma    Breast pain    CHF (congestive heart failure) (HCC)    Colon polyps    COPD (chronic obstructive pulmonary disease) (HCC)    Depression    Dysrhythmia    GERD (gastroesophageal reflux disease)    Glaucoma    Heart murmur    History of blood transfusion    Hypertension    Motion sickness    cars   Multilevel degenerative disc disease    Neuropathy    Bilateral arms and legs   Nipple discharge    Numbness of both lower extremities    bulging lumbar discs - per pt   Numbness of upper extremity    bilateral - degenerative cervical discs - per pt   Pneumonia    PONV (postoperative nausea and vomiting)    Sleep apnea    has CPAP   Smokers' cough (HCC)    Swelling of limb 03/11/2021   Wears dentures    full upper and lower     Past Surgical History:  Procedure Laterality Date   ABDOMINAL HYSTERECTOMY     BACK SURGERY     BELPHAROPTOSIS REPAIR     BROW LIFT Bilateral 03/24/2016   Procedure: BLEPHAROPLASTY;  Surgeon: Greig CHRISTELLA Gay, MD;  Location: [facility];  Service: Ophthalmology;  Laterality: Bilateral;  sleep apnea   CARDIAC CATHETERIZATION     COLONOSCOPY WITH PROPOFOL  N/A 10/19/2016   Procedure: COLONOSCOPY WITH PROPOFOL ;  Surgeon: Jinny Carmine, MD;  Location: [facility];  Service: Endoscopy;  Laterality: N/A;   COLONOSCOPY WITH PROPOFOL  N/A 05/12/2022   Procedure: COLONOSCOPY WITH PROPOFOL ;  Surgeon: Jinny Carmine, MD;  Location:  [facility];  Service: Endoscopy;  Laterality: N/A;   CORONARY STENT INTERVENTION N/A 03/07/2024   Procedure: CORONARY STENT INTERVENTION;  Surgeon: Ammon Blunt, MD;  Location: [facility] INVASIVE CV LAB;  Service: Cardiovascular;  Laterality: N/A;   DILATION AND CURETTAGE OF UTERUS     ECTOPIC PREGNANCY SURGERY     x2   ESOPHAGOGASTRODUODENOSCOPY (EGD) WITH PROPOFOL  N/A 10/19/2016   Procedure: ESOPHAGOGASTRODUODENOSCOPY (EGD) WITH PROPOFOL ;  Surgeon: Jinny Carmine, MD;  Location: [facility];  Service: Endoscopy;  Laterality: N/A;   LEFT HEART CATH AND CORONARY ANGIOGRAPHY Left 12/29/2016   Procedure: Left Heart Cath and Coronary Angiography;  Surgeon: Bathe Denyse LABOR, MD;  Location: [facility] INVASIVE CV LAB;  Service: Cardiovascular;  Laterality: Left;   LEFT HEART CATH AND CORONARY ANGIOGRAPHY Left 03/07/2024   Procedure: LEFT HEART CATH AND CORONARY ANGIOGRAPHY with possible coronary intervention;  Surgeon: Bathe Denyse LABOR, MD;  Location: [facility] INVASIVE CV LAB;  Service: Cardiovascular;  Laterality: Left;   NECK SURGERY  2003   POLYPECTOMY  10/19/2016   Procedure: POLYPECTOMY;  Surgeon: Jinny Carmine, MD;  Location: [facility];  Service:  Endoscopy;;   SPINE SURGERY  1989, 1998   TOTAL ABDOMINAL HYSTERECTOMY W/ BILATERAL SALPINGOOPHORECTOMY  2014   TUBAL LIGATION       Current Outpatient Medications  Medication Sig Dispense Refill   albuterol  (VENTOLIN  HFA) 108 (90 Base) MCG/ACT inhaler Inhale 1-2 puffs into the lungs every 6 (six) hours as needed for wheezing or shortness of breath. 18 each 1   aluminum -magnesium  hydroxide-simethicone  (MAALOX) 200-200-20 MG/5ML SUSP Take 30 mLs by mouth 4 (four) times daily -  before meals and at bedtime. 1680 mL 0   amLODipine  (NORVASC ) 10 MG tablet Take 1 tablet (10 mg total) by mouth daily. 90 tablet 1   aspirin  EC 81 MG tablet Take 1 tablet (81 mg total) by mouth daily. Swallow whole. 30 tablet 12   atenolol  (TENORMIN ) 50 MG tablet  Take 1 tablet (50 mg total) by mouth daily. 90 tablet 1   budesonide -glycopyrrolate -formoterol  (BREZTRI  AEROSPHERE) 160-9-4.8 MCG/ACT AERO inhaler Inhale 2 puffs into the lungs 2 (two) times daily.     busPIRone  (BUSPAR ) 15 MG tablet Take 1 tablet (15 mg total) by mouth 3 (three) times daily. 270 tablet 1   clonazePAM  (KLONOPIN ) 1 MG tablet Take 1 tablet (1 mg total) by mouth 2 (two) times daily as needed. for anxiety 60 tablet 0   dapagliflozin  propanediol (FARXIGA ) 10 MG TABS tablet Take 1 tablet (10 mg total) by mouth daily before breakfast. 30 tablet 3   doxycycline  (VIBRA -TABS) 100 MG tablet Take 1 tablet (100 mg total) by mouth 2 (two) times daily. 20 tablet 0   Evolocumab  (REPATHA  SURECLICK) 140 MG/ML SOAJ INJECT 140 MG INTO THE SKIN EVERY 14 (FOURTEEN) DAYS. 6 mL 3   ezetimibe  (ZETIA ) 10 MG tablet Take 1 tablet (10 mg total) by mouth daily. 90 tablet 1   FLUoxetine  (PROZAC ) 40 MG capsule Take 2 capsules (80 mg total) by mouth daily. 180 capsule 1   furosemide  (LASIX ) 20 MG tablet TAKE 1 TABLET BY MOUTH ONCE DAILY AS NEEDED FOR SWELLING 60 tablet 3   hydrOXYzine  (ATARAX ) 25 MG tablet Take 1 tablet (25 mg total) by mouth 3 (three) times daily as needed. 270 tablet 1   isosorbide  mononitrate (IMDUR ) 60 MG 24 hr tablet Take 1 tablet (60 mg total) by mouth daily. 90 tablet 1   losartan  (COZAAR ) 100 MG tablet Take 1 tablet (100 mg total) by mouth daily. 90 tablet 1   nicotine  (NICODERM CQ  - DOSED IN MG/24 HOURS) 21 mg/24hr patch Place 1 patch (21 mg total) onto the skin daily. 28 patch 1   ondansetron  (ZOFRAN ) 4 MG tablet Take 1 tablet (4 mg total) by mouth every 8 (eight) hours as needed for nausea or vomiting. 60 tablet 3   pantoprazole  (PROTONIX ) 40 MG tablet Take 1 tablet (40 mg total) by mouth 2 (two) times daily. 180 tablet 1   prasugrel  (EFFIENT ) 10 MG TABS tablet Take 1 tablet (10 mg total) by mouth daily. 30 tablet 0   predniSONE  (DELTASONE ) 10 MG tablet 6 tabs in the AM days 1 and 2, 5  pills days 3 and 4, decrease by 1 every other day until gone. 42 tablet 0   QUEtiapine  Fumarate (SEROQUEL  XR) 150 MG 24 hr tablet Take 1 tablet (150 mg total) by mouth at bedtime. 90 tablet 0   ranolazine  (RANEXA ) 1000 MG SR tablet Take 1 tablet (1,000 mg total) by mouth 2 (two) times daily. 60 tablet 2   semaglutide -weight management (WEGOVY ) 0.25 MG/0.5ML SOAJ SQ injection  Inject 0.25 mg into the skin once a week. 2 mL 0   [START ON 06/20/2024] semaglutide -weight management (WEGOVY ) 0.5 MG/0.5ML SOAJ SQ injection Inject 0.5 mg into the skin once a week. 2 mL 1   [START ON 07/18/2024] semaglutide -weight management (WEGOVY ) 1 MG/0.5ML SOAJ SQ injection Inject 1 mg into the skin once a week. 2 mL 1   tiZANidine  (ZANAFLEX ) 4 MG tablet Take 1 tablet (4 mg total) by mouth every 8 (eight) hours as needed for muscle spasms. 90 tablet 1   Vitamin D , Ergocalciferol , (DRISDOL ) 1.25 MG (50000 UNIT) CAPS capsule TAKE 1 CAPSULE BY MOUTH ONE TIME PER WEEK 12 capsule 0   No current facility-administered medications for this visit.    Allergies:   Aripiprazole , Bupropion, Ace inhibitors, Paxlovid [nirmatrelvir-ritonavir], Statins, Azithromycin, and Erythromycin    Social History:   reports that she has been smoking cigarettes. She has a 61.5 pack-year smoking history. She has never used smokeless tobacco. She reports that she does not drink alcohol and does not use drugs.   Family History:  family history includes Cancer in her brother, maternal grandfather, and maternal uncle.    ROS:     Review of Systems  Constitutional: Negative.   HENT: Negative.    Eyes: Negative.   Respiratory: Negative.    Gastrointestinal: Negative.   Genitourinary: Negative.   Musculoskeletal: Negative.   Skin: Negative.   Neurological: Negative.   Endo/Heme/Allergies: Negative.   Psychiatric/Behavioral: Negative.    All other systems reviewed and are negative.     All other systems are reviewed and negative.     PHYSICAL EXAM: VS:  BP 117/69   Pulse 77   Ht 5' 4 (1.626 m)   Wt 242 lb 3.2 oz (109.9 kg)   SpO2 92%   BMI 41.57 kg/m  , BMI Body mass index is 41.57 kg/m. Last weight:  Wt Readings from Last 3 Encounters:  05/30/24 242 lb 3.2 oz (109.9 kg)  05/23/24 240 lb (108.9 kg)  05/01/24 238 lb 9.6 oz (108.2 kg)     Physical Exam Constitutional:      Appearance: Normal appearance.  Cardiovascular:     Rate and Rhythm: Normal rate and regular rhythm.     Heart sounds: Normal heart sounds.  Pulmonary:     Effort: Pulmonary effort is normal.     Breath sounds: Normal breath sounds.  Musculoskeletal:     Right lower leg: No edema.     Left lower leg: No edema.  Neurological:     Mental Status: She is alert.       EKG:   Recent Labs: 04/11/2024: TSH 0.543 05/23/2024: ALT 12; BUN 19; Creatinine, Ser 0.99; Hemoglobin 9.5; Platelets 310; Potassium 4.1; Sodium 141    Lipid Panel    Component Value Date/Time   CHOL 138 05/23/2024 1153   TRIG 288 (H) 05/23/2024 1153   HDL 32 (L) 05/23/2024 1153   CHOLHDL 4.7 (H) 08/30/2020 0904   LDLCALC 60 05/23/2024 1153      Other studies Reviewed: Additional studies/ records that were reviewed today include:  Review of the above records demonstrates:       No data to display            ASSESSMENT AND PLAN:    ICD-10-CM   1. Other chest pain  R07.89 dapagliflozin  propanediol (FARXIGA ) 10 MG TABS tablet   Recurrent chest pain and SOB, echo had normal lVEf, diastolic dysfunction and mild MR/TR/PR. Stress test was normal.  2. CHF (congestive heart failure), NYHA class III, acute on chronic, diastolic (HCC)  I50.33 dapagliflozin  propanediol (FARXIGA ) 10 MG TABS tablet    3. CAD S/P percutaneous coronary angioplasty  I25.10 dapagliflozin  propanediol (FARXIGA ) 10 MG TABS tablet   Z98.61     4. Coronary artery disease of native artery of native heart with stable angina pectoris  I25.118 dapagliflozin  propanediol (FARXIGA )  10 MG TABS tablet    5. Status post percutaneous coronary intervention (PCI)  Z98.61 dapagliflozin  propanediol (FARXIGA ) 10 MG TABS tablet    6. SOB (shortness of breath)  R06.02 dapagliflozin  propanediol (FARXIGA ) 10 MG TABS tablet   Has diastolic dysfunction, add farxiga     7. Nicotine  dependence, cigarettes, uncomplicated  F17.210 dapagliflozin  propanediol (FARXIGA ) 10 MG TABS tablet    8. Mixed hyperlipidemia  E78.2 dapagliflozin  propanediol (FARXIGA ) 10 MG TABS tablet    9. Iron  deficiency anemia, unspecified iron  deficiency anemia type  D50.9    Has anemia with hemoglobin 9. Feels tired.    10. Acute diastolic congestive heart failure (HCC)  I50.31    Take lasix  once and twice as needed and add farxiag       Problem List Items Addressed This Visit       Cardiovascular and Mediastinum   CAD (coronary artery disease)   Relevant Medications   dapagliflozin  propanediol (FARXIGA ) 10 MG TABS tablet   CHF (congestive heart failure), NYHA class III, acute on chronic, diastolic (HCC)   Relevant Medications   dapagliflozin  propanediol (FARXIGA ) 10 MG TABS tablet     Other   Hyperlipidemia   Relevant Medications   dapagliflozin  propanediol (FARXIGA ) 10 MG TABS tablet   Nicotine  dependence, cigarettes, uncomplicated   Relevant Medications   dapagliflozin  propanediol (FARXIGA ) 10 MG TABS tablet   Iron  deficiency anemia   Other chest pain - Primary   Relevant Medications   dapagliflozin  propanediol (FARXIGA ) 10 MG TABS tablet   SOB (shortness of breath)   Relevant Medications   dapagliflozin  propanediol (FARXIGA ) 10 MG TABS tablet   Other Visit Diagnoses       CAD S/P percutaneous coronary angioplasty       Relevant Medications   dapagliflozin  propanediol (FARXIGA ) 10 MG TABS tablet     Status post percutaneous coronary intervention (PCI)       Relevant Medications   dapagliflozin  propanediol (FARXIGA ) 10 MG TABS tablet     Acute diastolic congestive heart failure (HCC)        Take lasix  once and twice as needed and add farxiag          Disposition:   No follow-ups on file.    Total time spent: 45 minutes  Signed,  Denyse Bathe, MD  05/30/2024 1:36 PM    [facility] "

## 2024-06-05 ENCOUNTER — Encounter

## 2024-06-12 ENCOUNTER — Encounter: Attending: Cardiovascular Disease | Admitting: Emergency Medicine

## 2024-06-12 DIAGNOSIS — Z48812 Encounter for surgical aftercare following surgery on the circulatory system: Secondary | ICD-10-CM | POA: Diagnosis not present

## 2024-06-12 DIAGNOSIS — Z955 Presence of coronary angioplasty implant and graft: Secondary | ICD-10-CM | POA: Insufficient documentation

## 2024-06-12 NOTE — Progress Notes (Signed)
 Daily Session Note  Patient Details  Name: Hannah Kaiser MRN: [id number] Date of Birth: Oct 11, 1960 Referring Provider:   Flowsheet Row Cardiac Rehab from 04/03/2024 in [facility] Cardiac and Pulmonary Rehab  Referring Provider Dr. Denyse Bathe, MD    Encounter Date: 06/12/2024  Check In:  Session Check In - 06/12/24 1522       Check-In   Supervising physician immediately available to respond to emergencies See telemetry face sheet for immediately available ER MD    Location [facility]-Cardiac & Pulmonary Rehab    Staff Present Leita Franks RN,BSN;[facility] BS, Exercise Physiologist;Margaret Best, MS, Exercise Physiologist    Virtual Visit No    Medication changes reported     No    Fall or balance concerns reported    No    Tobacco Cessation Use Increase    Current number of cigarettes/nicotine  per day     5    Warm-up and Cool-down Performed on first and last piece of equipment    Resistance Training Performed Yes    VAD Patient? No    PAD/SET Patient? No      Pain Assessment   Currently in Pain? No/denies             Tobacco Use History[1]  Goals Met:  Independence with exercise equipment Exercise tolerated well No report of concerns or symptoms today Strength training completed today  Goals Unmet:  Not Applicable  Comments: Pt able to follow exercise prescription today without complaint.  Will continue to monitor for progression.    Dr. Oneil Pinal is Medical Director for [facility] Cardiac Rehabilitation.  Dr. Fuad Aleskerov is Medical Director for [facility] Pulmonary Rehabilitation.    [1]  Social History Tobacco Use  Smoking Status Every Day   Current packs/day: 1.50   Average packs/day: 1.5 packs/day for 41.0 years (61.5 ttl pk-yrs)   Types: Cigarettes  Smokeless Tobacco Never  Tobacco Comments   since age 89

## 2024-06-15 ENCOUNTER — Encounter: Admitting: *Deleted

## 2024-06-15 DIAGNOSIS — Z955 Presence of coronary angioplasty implant and graft: Secondary | ICD-10-CM

## 2024-06-15 DIAGNOSIS — Z48812 Encounter for surgical aftercare following surgery on the circulatory system: Secondary | ICD-10-CM | POA: Diagnosis not present

## 2024-06-15 NOTE — Progress Notes (Signed)
 Daily Session Note  Patient Details  Name: PAULLETTE MCKAIN MRN: [id number] Date of Birth: [birth year] Referring Provider:   Flowsheet Row Cardiac Rehab from 04/03/2024 in [facility] Cardiac and Pulmonary Rehab  Referring Provider Dr. Denyse Bathe, MD    Encounter Date: 06/15/2024  Check In:  Session Check In - 06/15/24 1553       Check-In   Supervising physician immediately available to respond to emergencies See telemetry face sheet for immediately available ER MD    Location [facility]-Cardiac & Pulmonary Rehab    Staff Present Othel Durand, RN, BSN, CCRP;Meredith Tressa RN,BSN;Joseph Hood RCP,RRT,BSRT;Noah Tickle, [place], Exercise Physiologist    Virtual Visit No    Medication changes reported     No    Fall or balance concerns reported    No    Warm-up and Cool-down Performed on first and last piece of equipment    Resistance Training Performed Yes    VAD Patient? No    PAD/SET Patient? No      Pain Assessment   Currently in Pain? No/denies             Tobacco Use History[1]  Goals Met:  Independence with exercise equipment Exercise tolerated well No report of concerns or symptoms today  Goals Unmet:  Not Applicable  Comments: Pt able to follow exercise prescription today without complaint.  Will continue to monitor for progression.    Dr. Oneil Pinal is Medical Director for [facility] Cardiac Rehabilitation.  Dr. Fuad Aleskerov is Medical Director for [facility] Pulmonary Rehabilitation.    [1]  Social History Tobacco Use  Smoking Status Every Day   Current packs/day: 1.50   Average packs/day: 1.5 packs/day for 41.0 years (61.5 ttl pk-yrs)   Types: Cigarettes  Smokeless Tobacco Never  Tobacco Comments   since age 64

## 2024-06-19 ENCOUNTER — Other Ambulatory Visit: Payer: Self-pay | Admitting: Family Medicine

## 2024-06-19 ENCOUNTER — Other Ambulatory Visit: Payer: Self-pay

## 2024-06-19 ENCOUNTER — Encounter: Admitting: Emergency Medicine

## 2024-06-19 DIAGNOSIS — Z955 Presence of coronary angioplasty implant and graft: Secondary | ICD-10-CM

## 2024-06-19 DIAGNOSIS — Z48812 Encounter for surgical aftercare following surgery on the circulatory system: Secondary | ICD-10-CM | POA: Diagnosis not present

## 2024-06-19 NOTE — Progress Notes (Signed)
 Daily Session Note  Patient Details  Name: Hannah Kaiser MRN: [id number] Date of Birth: [birth year] Referring Provider:   Flowsheet Row Cardiac Rehab from 04/03/2024 in [facility] Cardiac and Pulmonary Rehab  Referring Provider Dr. Denyse Bathe, MD    Encounter Date: 06/19/2024  Check In:  Session Check In - 06/19/24 1529       Check-In   Supervising physician immediately available to respond to emergencies See telemetry face sheet for immediately available ER MD    Location [facility]-Cardiac & Pulmonary Rehab    Staff Present Leita Franks RN,BSN;Joseph [facility] BS, Exercise Physiologist;Margaret Best, MS, Exercise Physiologist    Virtual Visit No    Medication changes reported     No    Fall or balance concerns reported    No    Tobacco Cessation No Change    Warm-up and Cool-down Performed on first and last piece of equipment    Resistance Training Performed Yes    VAD Patient? No    PAD/SET Patient? No      Pain Assessment   Currently in Pain? No/denies             Tobacco Use History[1]  Goals Met:  Independence with exercise equipment Exercise tolerated well No report of concerns or symptoms today Strength training completed today  Goals Unmet:  Not Applicable  Comments: Pt able to follow exercise prescription today without complaint.  Will continue to monitor for progression.    Dr. Oneil Pinal is Medical Director for [facility] Cardiac Rehabilitation.  Dr. Fuad Aleskerov is Medical Director for [facility] Pulmonary Rehabilitation.    [1]  Social History Tobacco Use  Smoking Status Every Day   Current packs/day: 1.50   Average packs/day: 1.5 packs/day for 41.0 years (61.5 ttl pk-yrs)   Types: Cigarettes  Smokeless Tobacco Never  Tobacco Comments   since age 89

## 2024-06-21 ENCOUNTER — Encounter: Payer: Self-pay | Admitting: *Deleted

## 2024-06-21 DIAGNOSIS — Z955 Presence of coronary angioplasty implant and graft: Secondary | ICD-10-CM

## 2024-06-21 NOTE — Progress Notes (Signed)
 Cardiac Individual Treatment Plan  Patient Details  Name: Hannah Kaiser MRN: [id number] Date of Birth: [birth year] Referring Provider:   Flowsheet Row Cardiac Rehab from 04/03/2024 in [facility] Cardiac and Pulmonary Rehab  Referring Provider Dr. Denyse Bathe, MD    Initial Encounter Date:  Flowsheet Row Cardiac Rehab from 04/03/2024 in [facility] Cardiac and Pulmonary Rehab  Date 04/03/24    Visit Diagnosis: Status post coronary artery stent placement  Patient's Home Medications on Admission: Current Medications[1]  Past Medical History: Past Medical History:  Diagnosis Date   Anemia    Anxiety    Arthritis    knees, thoracic spurs and arthritis   Asthma    Breast pain    CHF (congestive heart failure) (HCC)    Colon polyps    COPD (chronic obstructive pulmonary disease) (HCC)    Depression    Dysrhythmia    GERD (gastroesophageal reflux disease)    Glaucoma    Heart murmur    History of blood transfusion    Hypertension    Motion sickness    cars   Multilevel degenerative disc disease    Neuropathy    Bilateral arms and legs   Nipple discharge    Numbness of both lower extremities    bulging lumbar discs - per pt   Numbness of upper extremity    bilateral - degenerative cervical discs - per pt   Pneumonia    PONV (postoperative nausea and vomiting)    Sleep apnea    has CPAP   Smokers' cough (HCC)    Swelling of limb 03/11/2021   Wears dentures    full upper and lower    Tobacco Use: Tobacco Use History[2]  Labs: Review Flowsheet  More data exists      Latest Ref Rng & Units 05/17/2023 10/19/2023 12/27/2023 04/11/2024 05/23/2024  Labs for ITP Cardiac and Pulmonary Rehab  Cholestrol 100 - 199 mg/dL 851  772  - 813  861   LDL (calc) 0 - 99 mg/dL 70  849  - 891  60   HDL-C >39 mg/dL 54  53  - 47  32   Trlycerides 0 - 149 mg/dL 860  864  - 820  711   Hemoglobin A1c 4.8 - 5.6 % 5.7  5.4  5.6  - -     Exercise Target Goals: Exercise Program Goal: Individual  exercise prescription set using results from initial 6 min walk test and THRR while considering  patients activity barriers and safety.   Exercise Prescription Goal: Initial exercise prescription builds to 30-45 minutes a day of aerobic activity, 2-3 days per week.  Home exercise guidelines will be given to patient during program as part of exercise prescription that the participant will acknowledge.   Education: Aerobic Exercise: - Group verbal and visual presentation on the components of exercise prescription. Introduces F.I.T.T principle from ACSM for exercise prescriptions.  Reviews F.I.T.T. principles of aerobic exercise including progression. Written material provided at class time.   Education: Resistance Exercise: - Group verbal and visual presentation on the components of exercise prescription. Introduces F.I.T.T principle from ACSM for exercise prescriptions  Reviews F.I.T.T. principles of resistance exercise including progression. Written material provided at class time.    Education: Exercise & Equipment Safety: - Individual verbal instruction and demonstration of equipment use and safety with use of the equipment.   Education: Exercise Physiology & General Exercise Guidelines: - Group verbal and written instruction with models to review the exercise physiology of the  cardiovascular system and associated critical values. Provides general exercise guidelines with specific guidelines to those with heart or lung disease. Written material provided at class time.   Education: Flexibility, Balance, Mind/Body Relaxation: - Group verbal and visual presentation with interactive activity on the components of exercise prescription. Introduces F.I.T.T principle from ACSM for exercise prescriptions. Reviews F.I.T.T. principles of flexibility and balance exercise training including progression. Also discusses the mind body connection.  Reviews various relaxation techniques to help reduce and  manage stress (i.e. Deep breathing, progressive muscle relaxation, and visualization). Balance handout provided to take home. Written material provided at class time.   Activity Barriers & Risk Stratification:  Activity Barriers & Cardiac Risk Stratification - 04/03/24 1403       Activity Barriers & Cardiac Risk Stratification   Activity Barriers Arthritis;Back Problems;Neck/Spine Problems;Balance Concerns;Shortness of Breath;Deconditioning    Cardiac Risk Stratification High          6 Minute Walk:  6 Minute Walk     Row Name 04/03/24 1402         6 Minute Walk   Phase Initial     Distance 975 feet     Walk Time 6 minutes     # of Rest Breaks 0     MPH 1.85     METS 1.9     RPE 11     Perceived Dyspnea  1     VO2 Peak 6.65     Symptoms Yes (comment)     Comments chest discomfort     Resting HR 55 bpm     Resting BP 126/72     Resting Oxygen Saturation  96 %     Exercise Oxygen Saturation  during 6 min walk 99 %     Max Ex. HR 78 bpm     Max Ex. BP 140/78     2 Minute Post BP 122/72        Oxygen Initial Assessment:   Oxygen Re-Evaluation:   Oxygen Discharge (Final Oxygen Re-Evaluation):   Initial Exercise Prescription:  Initial Exercise Prescription - 04/03/24 1400       Date of Initial Exercise RX and Referring Provider   Date 04/03/24    Referring Provider Dr. Denyse Bathe, MD      Oxygen   Maintain Oxygen Saturation 88% or higher      Treadmill   MPH 1.6    Grade 0    Minutes 15    METs 2.23      Recumbant Bike   Level 1    RPM 50    Watts 15    Minutes 15    METs 1.9      NuStep   Level 2    SPM 80    Minutes 15    METs 1.9      Prescription Details   Frequency (times per week) 2    Duration Progress to 30 minutes of continuous aerobic without signs/symptoms of physical distress      Intensity   THRR 40-80% of Max Heartrate 96-138    Ratings of Perceived Exertion 11-13    Perceived Dyspnea 0-4      Progression    Progression Continue to progress workloads to maintain intensity without signs/symptoms of physical distress.      Resistance Training   Training Prescription Yes    Weight 3 lb    Reps 10-15          Perform Capillary Blood Glucose checks as needed.  Exercise Prescription Changes:   Exercise Prescription Changes     Row Name 04/03/24 1400 04/18/24 0800 05/02/24 1300 05/16/24 1300 06/07/24 1100     Response to Exercise   Blood Pressure (Admit) 126/72 106/58 124/70 102/68 106/58   Blood Pressure (Exercise) 140/78 118/60 136/70 118/58 148/74   Blood Pressure (Exit) 122/72 104/62 110/66 102/62 108/62   Heart Rate (Admit) 55 bpm 62 bpm 65 bpm 69 bpm 62 bpm   Heart Rate (Exercise) 78 bpm 114 bpm 96 bpm 100 bpm 109 bpm   Heart Rate (Exit) 59 bpm 62 bpm 65 bpm 103 bpm 67 bpm   Oxygen Saturation (Admit) 96 % -- -- -- --   Oxygen Saturation (Exercise) 99 % -- -- -- --   Rating of Perceived Exertion (Exercise) 11 14 15 15 13    Perceived Dyspnea (Exercise) 1 0 -- -- --   Symptoms chest discomfort none none none none   Comments Results first 2 weeks of exercise -- -- --   Duration -- Progress to 30 minutes of  aerobic without signs/symptoms of physical distress Continue with 30 min of aerobic exercise without signs/symptoms of physical distress. Continue with 30 min of aerobic exercise without signs/symptoms of physical distress. Continue with 30 min of aerobic exercise without signs/symptoms of physical distress.   Intensity -- THRR unchanged THRR unchanged THRR unchanged THRR unchanged     Progression   Progression -- Continue to progress workloads to maintain intensity without signs/symptoms of physical distress. Continue to progress workloads to maintain intensity without signs/symptoms of physical distress. Continue to progress workloads to maintain intensity without signs/symptoms of physical distress. Continue to progress workloads to maintain intensity without signs/symptoms of  physical distress.   Average METs -- 2.5 2.57 2.79 2.12     Resistance Training   Training Prescription -- Yes Yes Yes --   Weight -- 3lb 3 lb 3 lb 3 lb   Reps -- 10-15 10-15 10-15 10-15     Interval Training   Interval Training -- No No No No     Treadmill   MPH -- 2 -- -- 1.4   Grade -- 0 -- -- 0   Minutes -- 15 -- -- 15   METs -- 2.53 -- -- 2.07     Recumbant Bike   Level -- -- 1.9 1.3 --   Watts -- -- 20 20 --   Minutes -- -- 15 15 --   METs -- -- 2.8 2.58 --     NuStep   Level -- 2 3 4 4    Minutes -- 15 15 15 15    METs -- 2.1 2.6 3 2.3     REL-XR   Level -- 1 -- -- 1   Minutes -- 15 -- -- 15   METs -- 3.2 -- -- 2.2     Oxygen   Maintain Oxygen Saturation -- 88% or higher 88% or higher 88% or higher 88% or higher    Row Name 06/14/24 1600             Response to Exercise   Blood Pressure (Admit) 112/58       Blood Pressure (Exercise) 140/68       Blood Pressure (Exit) 120/64       Heart Rate (Admit) 64 bpm       Heart Rate (Exercise) 99 bpm       Heart Rate (Exit) 78 bpm       Rating of Perceived Exertion (Exercise)  13       Symptoms none       Duration Continue with 30 min of aerobic exercise without signs/symptoms of physical distress.       Intensity THRR unchanged         Progression   Progression Continue to progress workloads to maintain intensity without signs/symptoms of physical distress.       Average METs 2.34         Resistance Training   Weight 3 lb       Reps 10-15         Interval Training   Interval Training No         Treadmill   MPH 1.6       Grade 0.5       Minutes 15       METs 2.34         REL-XR   Level 1       Minutes 15         Oxygen   Maintain Oxygen Saturation 88% or higher          Exercise Comments:   Exercise Comments     Row Name 04/10/24 1540           Exercise Comments First full day of exercise!  Patient was oriented to gym and equipment including functions, settings, policies, and  procedures.  Patient's individual exercise prescription and treatment plan were reviewed.  All starting workloads were established based on the results of the 6 minute walk test done at initial orientation visit.  The plan for exercise progression was also introduced and progression will be customized based on patient's performance and goals.          Exercise Goals and Review:   Exercise Goals     Row Name 04/03/24 1151             Exercise Goals   Increase Physical Activity Yes       Intervention Provide advice, education, support and counseling about physical activity/exercise needs.;Develop an individualized exercise prescription for aerobic and resistive training based on initial evaluation findings, risk stratification, comorbidities and participant's personal goals.       Expected Outcomes Short Term: Attend rehab on a regular basis to increase amount of physical activity.;Long Term: Add in home exercise to make exercise part of routine and to increase amount of physical activity.;Long Term: Exercising regularly at least 3-5 days a week.       Increase Strength and Stamina Yes       Intervention Provide advice, education, support and counseling about physical activity/exercise needs.;Develop an individualized exercise prescription for aerobic and resistive training based on initial evaluation findings, risk stratification, comorbidities and participant's personal goals.       Expected Outcomes Short Term: Increase workloads from initial exercise prescription for resistance, speed, and METs.;Short Term: Perform resistance training exercises routinely during rehab and add in resistance training at home;Long Term: Improve cardiorespiratory fitness, muscular endurance and strength as measured by increased METs and functional capacity ( )       Able to understand and use rate of perceived exertion (RPE) scale Yes       Intervention Provide education and explanation on how to use RPE scale        Expected Outcomes Short Term: Able to use RPE daily in rehab to express subjective intensity level;Long Term:  Able to use RPE to guide intensity level when exercising independently  Able to understand and use Dyspnea scale Yes       Intervention Provide education and explanation on how to use Dyspnea scale       Expected Outcomes Short Term: Able to use Dyspnea scale daily in rehab to express subjective sense of shortness of breath during exertion;Long Term: Able to use Dyspnea scale to guide intensity level when exercising independently       Knowledge and understanding of Target Heart Rate Range (THRR) Yes       Intervention Provide education and explanation of THRR including how the numbers were predicted and where they are located for reference       Expected Outcomes Short Term: Able to state/look up THRR;Long Term: Able to use THRR to govern intensity when exercising independently;Short Term: Able to use daily as guideline for intensity in rehab       Able to check pulse independently Yes       Intervention Provide education and demonstration on how to check pulse in carotid and radial arteries.;Review the importance of being able to check your own pulse for safety during independent exercise       Expected Outcomes Short Term: Able to explain why pulse checking is important during independent exercise;Long Term: Able to check pulse independently and accurately       Understanding of Exercise Prescription Yes       Intervention Provide education, explanation, and written materials on patient's individual exercise prescription       Expected Outcomes Short Term: Able to explain program exercise prescription;Long Term: Able to explain home exercise prescription to exercise independently          Exercise Goals Re-Evaluation :  Exercise Goals Re-Evaluation     Row Name 04/10/24 1540 04/18/24 0846 05/02/24 1340 05/16/24 1359 06/07/24 1114     Exercise Goal Re-Evaluation   Exercise  Goals Review Increase Physical Activity;Able to understand and use rate of perceived exertion (RPE) scale;Knowledge and understanding of Target Heart Rate Range (THRR);Understanding of Exercise Prescription;Increase Strength and Stamina;Able to understand and use Dyspnea scale;Able to check pulse independently Increase Physical Activity;Increase Strength and Stamina;Understanding of Exercise Prescription Increase Physical Activity;Increase Strength and Stamina;Understanding of Exercise Prescription -- Increase Physical Activity;Increase Strength and Stamina;Understanding of Exercise Prescription   Comments Reviewed RPE and dyspnea scale, THR and program prescription with pt today.  Pt voiced understanding and was given a copy of goals to take home. Hannah Kaiser is off to a good start in the program, and was able to attend her first 2 sessions during this review period. During those sessions she was able to use the treadmill at a speed of and no incline, and use the T4 nustep at level 2. We will continue to monitor her progress in the program. Hannah Kaiser has only attended two sessions since the last review. She recently improved to level 3 on the T4 nustep and level 1.9 on the recumbent bike. We will continue to monitor her progress in the program. Hannah Kaiser has only attended one session since the last review. She was able to increase to level 4 on the T4 nustep. She also continued to use 3 lb handweights for resistance training. We will continue to monitor her progress in the program. Hannah Kaiser is doing well in the program. She continues to work at level 4 on the T4 nustep, and level 1 on the XR. She did decrease her treadmill speed from 2 mph down to 1.4 mph. We will continue to monitor her progress  in the program.   Expected Outcomes Short: Use RPE daily to regulate intensity.  Long: Follow program prescription in THR. Short: Continue to follow exercise prescription. Long: Continue exercise to improve strength and stamina.  Short: Attend rehab more consistently. Long: Continue exercise to improve strength and stamina. Short: Attend rehab more consistently. Long: Continue exercise to improve strength and stamina. Short: Increase treadmill workload back up to previous speed. Long: Continue exercise to improve strength and stamina.    Row Name 06/14/24 1608             Exercise Goal Re-Evaluation   Exercise Goals Review Increase Physical Activity;Increase Strength and Stamina;Understanding of Exercise Prescription       Comments Hannah Kaiser is doing well in rehab. She was recently able to increase her workload on the treadmill to a speed of 1. and 0.5% incline. She was also able to maintain a level 1 on the XR. We will continue to monitor her progress in the program.       Expected Outcomes Short: Continue to increase treadmill workload when able. Long: Continue exercise to improve strength and stamina.          Discharge Exercise Prescription (Final Exercise Prescription Changes):  Exercise Prescription Changes - 06/14/24 1600       Response to Exercise   Blood Pressure (Admit) 112/58    Blood Pressure (Exercise) 140/68    Blood Pressure (Exit) 120/64    Heart Rate (Admit) 64 bpm    Heart Rate (Exercise) 99 bpm    Heart Rate (Exit) 78 bpm    Rating of Perceived Exertion (Exercise) 13    Symptoms none    Duration Continue with 30 min of aerobic exercise without signs/symptoms of physical distress.    Intensity THRR unchanged      Progression   Progression Continue to progress workloads to maintain intensity without signs/symptoms of physical distress.    Average METs 2.34      Resistance Training   Weight 3 lb    Reps 10-15      Interval Training   Interval Training No      Treadmill   MPH 1.6    Grade 0.5    Minutes 15    METs 2.34      REL-XR   Level 1    Minutes 15      Oxygen   Maintain Oxygen Saturation 88% or higher          Nutrition:  Target Goals: Understanding of  nutrition guidelines, daily intake of sodium 1500mg , cholesterol 200mg , calories 30% from fat and 7% or less from saturated fats, daily to have 5 or more servings of fruits and vegetables.  Education: Nutrition 1 -Group instruction provided by verbal, written material, interactive activities, discussions, models, and posters to present general guidelines for heart healthy nutrition including macronutrients, label reading, and promoting whole foods over processed counterparts. Education serves as pensions consultant of discussion of heart healthy eating for all. Written material provided at class time.    Education: Nutrition 2 -Group instruction provided by verbal, written material, interactive activities, discussions, models, and posters to present general guidelines for heart healthy nutrition including sodium, cholesterol, and saturated fat. Providing guidance of habit forming to improve blood pressure, cholesterol, and body weight. Written material provided at class time.     Biometrics:  Pre Biometrics - 04/03/24 1153       Pre Biometrics   Height 5' 4.5 (1.638 m)    Weight 241 lb  3.2 oz (109.4 kg)    Waist Circumference 44.5 inches    Hip Circumference 50 inches    Waist to Hip Ratio 0.89 %    BMI (Calculated) 40.78    Single Leg Stand 3.33 seconds           Nutrition Therapy Plan and Nutrition Goals:  Nutrition Therapy & Goals - 04/03/24 1337       Nutrition Therapy   Diet Cardiac, Low Na    Protein (specify units) 80    Fiber 25 grams    Whole Grain Foods 3 servings    Saturated Fats 15 max. grams    Fruits and Vegetables 5 servings/day    Sodium 2 grams      Personal Nutrition Goals   Nutrition Goal Eat 3 times per day, small frequent meals or nutrient dense snacks    Personal Goal #2 Read labels and reduce sodium intake to below 2300mg . Ideally 1500mg  per day.    Personal Goal #3 Reduce saturated fat, less than 12g per day. Replace bad fats for more heart healthy  fats.    Comments Patient drinking mostly fruit juice with some water . She knows the juice is not good for her, set goal to work on cutting back on juice and increasing water  intake. She is on wegovy  and reports she often misses lunch. Spoke with her about eating 3 times per day, using nutrient dense snacks. Brainstormed several small and snack ideas. Reviewed mediterranean diet handout. Educated on types of fats, sources and how to read labels. Provided guideline limits of less than 1500mg  sodium and less than 12g saturated fat per day      Intervention Plan   Intervention Prescribe, educate and counsel regarding individualized specific dietary modifications aiming towards targeted core components such as weight, hypertension, lipid management, diabetes, heart failure and other comorbidities.;Nutrition handout(s) given to patient.    Expected Outcomes Short Term Goal: Understand basic principles of dietary content, such as calories, fat, sodium, cholesterol and nutrients.;Short Term Goal: A plan has been developed with personal nutrition goals set during dietitian appointment.;Long Term Goal: Adherence to prescribed nutrition plan.          Nutrition Assessments:  MEDIFICTS Score Key: >=70 Need to make dietary changes  40-70 Heart Healthy Diet <= 40 Therapeutic Level Cholesterol Diet  Flowsheet Row Cardiac Rehab from 04/10/2024 in [facility] Cardiac and Pulmonary Rehab  Picture Your Plate Total Score on Admission 38   Picture Your Plate Scores: <59 Unhealthy dietary pattern with much room for improvement. 41-50 Dietary pattern unlikely to meet recommendations for good health and room for improvement. 51-60 More healthful dietary pattern, with some room for improvement.  >60 Healthy dietary pattern, although there may be some specific behaviors that could be improved.    Nutrition Goals Re-Evaluation:  Nutrition Goals Re-Evaluation     Row Name 04/20/24 1550 05/15/24 1537           Goals    Current Weight -- 241 lb (109.3 kg)      Comment Hannah Kaiser recently spoke with the RD about how she can improve her nutrition. She has began to drink more water  throughout the day. She also has been working on reducing her sodium intake by not adding salt at the table. She also states that she has been paying more attention to food labels. Honestie has tried to reduce sodium and is still trying. She is drinking enough water  and has no problems with hydration.  Expected Outcome Short: Continue to reduce sodium intake. Long: Continue to practice heart healthy eating patterns as discussed with RD. Short: reduce sodium further. Long: Adhere to a low sodium diet.         Nutrition Goals Discharge (Final Nutrition Goals Re-Evaluation):  Nutrition Goals Re-Evaluation - 05/15/24 1537       Goals   Current Weight 241 lb (109.3 kg)    Comment Hannah Kaiser has tried to reduce sodium and is still trying. She is drinking enough water  and has no problems with hydration.    Expected Outcome Short: reduce sodium further. Long: Adhere to a low sodium diet.          Psychosocial: Target Goals: Acknowledge presence or absence of significant depression and/or stress, maximize coping skills, provide positive support system. Participant is able to verbalize types and ability to use techniques and skills needed for reducing stress and depression.   Education: Stress, Anxiety, and Depression - Group verbal and visual presentation to define topics covered.  Reviews how body is impacted by stress, anxiety, and depression.  Also discusses healthy ways to reduce stress and to treat/manage anxiety and depression. Written material provided at class time.   Education: Sleep Hygiene -Provides group verbal and written instruction about how sleep can affect your health.  Define sleep hygiene, discuss sleep cycles and impact of sleep habits. Review good sleep hygiene tips.   Initial Review & Psychosocial Screening:  Initial  Psych Review & Screening - 04/03/24 0948       Initial Review   Current issues with Current Stress Concerns    Source of Stress Concerns Family;Chronic Illness      Family Dynamics   Good Support System? Yes      Barriers   Psychosocial barriers to participate in program There are no identifiable barriers or psychosocial needs.;The patient should benefit from training in stress management and relaxation.      Screening Interventions   Interventions Encouraged to exercise;Provide feedback about the scores to participant;To provide support and resources with identified psychosocial needs    Expected Outcomes Short Term goal: Utilizing psychosocial counselor, staff and physician to assist with identification of specific Stressors or current issues interfering with healing process. Setting desired goal for each stressor or current issue identified.;Long Term Goal: Stressors or current issues are controlled or eliminated.;Short Term goal: Identification and review with participant of any Quality of Life or Depression concerns found by scoring the questionnaire.;Long Term goal: The participant improves quality of Life and PHQ9 Scores as seen by post scores and/or verbalization of changes          Quality of Life Scores:   Quality of Life - 04/03/24 1143       Quality of Life   Select Quality of Life      Quality of Life Scores   Health/Function Pre 21.1 %    Socioeconomic Pre 26.94 %    Psych/Spiritual Pre 24.86 %    Family Pre 17.7 %    GLOBAL Pre 22.7 %         Scores of 19 and below usually indicate a poorer quality of life in these areas.  A difference of  2-3 points is a clinically meaningful difference.  A difference of 2-3 points in the total score of the Quality of Life Index has been associated with significant improvement in overall quality of life, self-image, physical symptoms, and general health in studies assessing change in quality of life.  PHQ-9: Review Flowsheet  More data exists      05/15/2024 04/20/2024 04/11/2024 04/03/2024 01/27/2024  Depression screen PHQ 2/9  Decreased Interest 1 1 2 1 1   Down, Depressed, Hopeless 0 0 1 0 0  PHQ - 2 Score 1 1 3 1 1   Altered sleeping 3 3 1 1 1   Tired, decreased energy 3 3 3 2 2   Change in appetite 0 0 1 1 2   Feeling bad or failure about yourself  0 0 0 0 0  Trouble concentrating 1 1 1 3 2   Moving slowly or fidgety/restless 0 0 0 1 0  Suicidal thoughts 0 0 0 0 0  PHQ-9 Score 8 8 9  9  8    Difficult doing work/chores Somewhat difficult Somewhat difficult Somewhat difficult Somewhat difficult Somewhat difficult    Details       Data saved with a previous flowsheet row definition        Interpretation of Total Score  Total Score Depression Severity:  1-4 = Minimal depression, 5-9 = Mild depression, 10-14 = Moderate depression, 15-19 = Moderately severe depression, 20-27 = Severe depression   Psychosocial Evaluation and Intervention:  Psychosocial Evaluation - 04/03/24 1014       Psychosocial Evaluation & Interventions   Comments Ms. Gerads is coming to cardiac rehab post stents. She is still having some chest discomfort at times and feels more tired than before her stent. She is in contact with her doctor and thinks it may be some of the medicaiton she is on. She is hopeful that becoming more active will help with her symptoms some. She is interested in quitting smoking, but it is her destressor so she wants to find something that will help with her stress. 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 She helps her sister and mentions it is a high stress environment. She states she knows she needs to focus on her own health after all this time of helping others, and is ready to do so. Resources given for local mental health counselors. She works full time as a producer, television/film/video. She is ready to commit to  the program    Expected Outcomes Short: attend cardiac rehab for education and exercise Long: develop and maintain positive self care habits    Continue Psychosocial Services  Follow up required by staff          Psychosocial Re-Evaluation:  Psychosocial Re-Evaluation     Row Name 04/20/24 1554 05/15/24 1540           Psychosocial Re-Evaluation   Current issues with Current Stress Concerns;Current Sleep Concerns Current Psychotropic Meds;Current Stress Concerns      Comments We reviewed PHQ-9 questionairre with pt today as her previous scores had shown signs of depression. Her score improved slightly, but still shows symptoms of depression. She reports that her main stressor is her lack of sleep as she has a hard time falling asleep and wakes up throughout the night. Her sleep concerns have caused her to have less energy and she has felt fatigued. She has spoken with her doctor about her sleep concerns, and will go for a sleep study later this month. Reviewed patient health questionnaire (PHQ-9) with patient for follow up. Previously, patients score indicated signs/symptoms of depression.  Reviewed to see if patient is improving symptom wise while in program.  Score stayed the same  and patient states that it is because she is still working on her health.      Expected Outcomes Short: Participate in sleep study. Long: Continue to attend rehab for mental boost. Short: Continue to attend [facility] regularly for regular exercise and social engagement. Long: Continue to improve symptoms and manage a positive mental state.      Interventions Encouraged to attend Cardiac Rehabilitation for the exercise Encouraged to attend Cardiac Rehabilitation for the exercise      Continue Psychosocial Services  Follow up required by staff Follow up required by staff        Initial Review   Comments Sleep Concerns are her major stressor --         Psychosocial Discharge (Final Psychosocial Re-Evaluation):   Psychosocial Re-Evaluation - 05/15/24 1540       Psychosocial Re-Evaluation   Current issues with Current Psychotropic Meds;Current Stress Concerns    Comments Reviewed patient health questionnaire (PHQ-9) with patient for follow up. Previously, patients score indicated signs/symptoms of depression.  Reviewed to see if patient is improving symptom wise while in program.  Score stayed the same and patient states that it is because she is still working on her health.    Expected Outcomes Short: Continue to attend [facility] regularly for regular exercise and social engagement. Long: Continue to improve symptoms and manage a positive mental state.    Interventions Encouraged to attend Cardiac Rehabilitation for the exercise    Continue Psychosocial Services  Follow up required by staff          Vocational Rehabilitation: Provide vocational rehab assistance to qualifying candidates.   Vocational Rehab Evaluation & Intervention:  Vocational Rehab - 04/03/24 0947       Initial Vocational Rehab Evaluation & Intervention   Assessment shows need for Vocational Rehabilitation No          Education: Education Goals: Education classes will be provided on a variety of topics geared toward better understanding of heart health and risk factor modification. Participant will state understanding/return demonstration of topics presented as noted by education test scores.  Learning Barriers/Preferences:  Learning Barriers/Preferences - 04/03/24 0947       Learning Barriers/Preferences   Learning Barriers None    Learning Preferences Individual Instruction;Skilled Demonstration;Video          General Cardiac Education Topics:  AED/CPR: - Group verbal and written instruction with the use of models to demonstrate the basic use of the AED with the basic ABC's of resuscitation.   Test and Procedures: - Group verbal and visual presentation and models provide information about basic cardiac  anatomy and function. Reviews the testing methods done to diagnose heart disease and the outcomes of the test results. Describes the treatment choices: Medical Management, Angioplasty, or Coronary Bypass Surgery for treating various heart conditions including Myocardial Infarction, Angina, Valve Disease, and Cardiac Arrhythmias. Written material provided at class time.   Medication Safety: - Group verbal and visual instruction to review commonly prescribed medications for heart and lung disease. Reviews the medication, class of the drug, and side effects. Includes the steps to properly store meds and maintain the prescription regimen. Written material provided at class time.   Intimacy: - Group verbal instruction through game format to discuss how heart and lung disease can affect sexual intimacy. Written material provided at class time.   Know Your Numbers and Heart Failure: - Group verbal and visual instruction to discuss disease risk factors for cardiac and pulmonary disease and treatment options.  Reviews associated critical values for Overweight/Obesity, Hypertension, Cholesterol, and Diabetes.  Discusses basics of heart failure: signs/symptoms and treatments.  Introduces Heart Failure Zone chart for action plan for heart failure. Written material provided at class time.   Infection Prevention: - Provides verbal and written material to individual with discussion of infection control including proper hand washing and proper equipment cleaning during exercise session.   Falls Prevention: - Provides verbal and written material to individual with discussion of falls prevention and safety.   Other: -Provides group and verbal instruction on various topics (see comments)   Knowledge Questionnaire Score:  Knowledge Questionnaire Score - 04/03/24 1143       Knowledge Questionnaire Score   Pre Score 22/26          Core Components/Risk Factors/Patient Goals at Admission:  Personal  Goals and Risk Factors at Admission - 04/03/24 0943       Core Components/Risk Factors/Patient Goals on Admission    Weight Management Yes;Weight Loss    Intervention Weight Management: Develop a combined nutrition and exercise program designed to reach desired caloric intake, while maintaining appropriate intake of nutrient and fiber, sodium and fats, and appropriate energy expenditure required for the weight goal.;Weight Management: Provide education and appropriate resources to help participant work on and attain dietary goals.;Weight Management/Obesity: Establish reasonable short term and long term weight goals.;Obesity: Provide education and appropriate resources to help participant work on and attain dietary goals.    Goal Weight: Long Term 160 lb (72.6 kg)    Expected Outcomes Short Term: Continue to assess and modify interventions until short term weight is achieved;Weight Loss: Understanding of general recommendations for a balanced deficit meal plan, which promotes 1-2 lb weight loss per week and includes a negative energy balance of [phone] kcal/d;Understanding recommendations for meals to include 15-35% energy as protein, 25-35% energy from fat, 35-60% energy from carbohydrates, less than 200mg  of dietary cholesterol, 20-35 gm of total fiber daily;Understanding of distribution of calorie intake throughout the day with the consumption of 4-5 meals/snacks    Tobacco Cessation Yes    Number of packs per day 1 ppd    Intervention Offer self-teaching materials, assist with locating and accessing local/national Quit Smoking programs, and support quit date choice.;Assist the participant in steps to quit. Provide individualized education and counseling about committing to Tobacco Cessation, relapse prevention, and pharmacological support that can be provided by physician.    Expected Outcomes Short Term: Will demonstrate readiness to quit, by selecting a quit date.;Long Term: Complete abstinence from  all tobacco products for at least 12 months from quit date.;Short Term: Will quit all tobacco product use, adhering to prevention of relapse plan.    Hypertension Yes    Intervention Provide education on lifestyle modifcations including regular physical activity/exercise, weight management, moderate sodium restriction and increased consumption of fresh fruit, vegetables, and low fat dairy, alcohol moderation, and smoking cessation.;Monitor prescription use compliance.    Expected Outcomes Short Term: Continued assessment and intervention until BP is < 140/55mm HG in hypertensive participants. < 130/33mm HG in hypertensive participants with diabetes, heart failure or chronic kidney disease.;Long Term: Maintenance of blood pressure at goal levels.    Lipids Yes    Intervention Provide education and support for participant on nutrition & aerobic/resistive exercise along with prescribed medications to achieve LDL 70mg , HDL >40mg .    Expected Outcomes Short Term: Participant states understanding of desired cholesterol values and is compliant with medications prescribed. Participant is following exercise prescription and nutrition guidelines.;Long  Term: Cholesterol controlled with medications as prescribed, with individualized exercise RX and with personalized nutrition plan. Value goals: LDL < 70mg , HDL > 40 mg.          Education:Diabetes - Individual verbal and written instruction to review signs/symptoms of diabetes, desired ranges of glucose level fasting, after meals and with exercise. Acknowledge that pre and post exercise glucose checks will be done for 3 sessions at entry of program.   Core Components/Risk Factors/Patient Goals Review:   Goals and Risk Factor Review     Row Name 04/20/24 1558 05/15/24 1534           Core Components/Risk Factors/Patient Goals Review   Personal Goals Review Weight Management/Obesity;Tobacco Cessation;Hypertension Tobacco Cessation;Weight Management/Obesity       Review Hannah Kaiser states that she is still working towards weight loss, and has a long term goal to get down to 180 lbs. She weighed in today at 239.9 lbs. She is also working on cutting back on her smoking. She is down to 1/2 a pack of cigarettes per day, as she was previously doing 1 pack per day. She states that she does own a BP cuff, but has not been checking her readings routinely at home. We encouraged her to begin to check her BP at home to make sure her readings stay within normal ranges. She continues to take all of her medications as prescribed. Hannah Kaiser stated that she quit smoking and has not had one on a week. She just had enough and listened to her kids and decided it was time to stop. She is working on her weight and states some swelling. She is taking lasix  as needed and watches her weight daily.      Expected Outcomes Short: Continue to cut back on smoking. Long: Continue to manage lifestyle risk factors. Short: continue to be tobacco free and reduce weight. Long: reach weight goal.         Core Components/Risk Factors/Patient Goals at Discharge (Final Review):   Goals and Risk Factor Review - 05/15/24 1534       Core Components/Risk Factors/Patient Goals Review   Personal Goals Review Tobacco Cessation;Weight Management/Obesity    Review Hannah Kaiser stated that she quit smoking and has not had one on a week. She just had enough and listened to her kids and decided it was time to stop. She is working on her weight and states some swelling. She is taking lasix  as needed and watches her weight daily.    Expected Outcomes Short: continue to be tobacco free and reduce weight. Long: reach weight goal.          ITP Comments:  ITP Comments     Row Name 04/03/24 1011 04/10/24 1540 04/26/24 1305 05/24/24 0934 06/21/24 1132   ITP Comments Completed initial orientation and for cardiac rehab. Initial ITP created and sent for review to Dr. Oneil Pinal, Medical Director. First full day of  exercise!  Patient was oriented to gym and equipment including functions, settings, policies, and procedures.  Patient's individual exercise prescription and treatment plan were reviewed.  All starting workloads were established based on the results of the 6 minute walk test done at initial orientation visit.  The plan for exercise progression was also introduced and progression will be customized based on patient's performance and goals. 30 Day review completed. Medical Director ITP review done, changes made as directed, and signed approval by Medical Director. new to program 30 Day review completed. Medical Director ITP review done,  changes made as directed, and signed approval by Medical Director. 30 Day review completed. Medical Director ITP review done, changes made as directed, and signed approval by Medical Director.      Comments: 30 Day Review     [1]  Current Outpatient Medications:    albuterol  (VENTOLIN  HFA) 108 (90 Base) MCG/ACT inhaler, Inhale 1-2 puffs into the lungs every 6 (six) hours as needed for wheezing or shortness of breath., Disp: 18 each, Rfl: 1   aluminum -magnesium  hydroxide-simethicone  (MAALOX) 200-200-20 MG/5ML SUSP, Take 30 mLs by mouth 4 (four) times daily -  before meals and at bedtime., Disp: 1680 mL, Rfl: 0   amLODipine  (NORVASC ) 10 MG tablet, Take 1 tablet (10 mg total) by mouth daily., Disp: 90 tablet, Rfl: 1   aspirin  EC 81 MG tablet, Take 1 tablet (81 mg total) by mouth daily. Swallow whole., Disp: 30 tablet, Rfl: 12   atenolol  (TENORMIN ) 50 MG tablet, Take 1 tablet (50 mg total) by mouth daily., Disp: 90 tablet, Rfl: 1   budesonide -glycopyrrolate -formoterol  (BREZTRI  AEROSPHERE) 160-9-4.8 MCG/ACT AERO inhaler, Inhale 2 puffs into the lungs 2 (two) times daily., Disp: , Rfl:    busPIRone  (BUSPAR ) 15 MG tablet, Take 1 tablet (15 mg total) by mouth 3 (three) times daily., Disp: 270 tablet, Rfl: 1   clonazePAM  (KLONOPIN ) 1 MG tablet, Take 1 tablet (1 mg total) by  mouth 2 (two) times daily as needed. for anxiety, Disp: 60 tablet, Rfl: 0   dapagliflozin  propanediol (FARXIGA ) 10 MG TABS tablet, Take 1 tablet (10 mg total) by mouth daily before breakfast., Disp: 30 tablet, Rfl: 3   doxycycline  (VIBRA -TABS) 100 MG tablet, Take 1 tablet (100 mg total) by mouth 2 (two) times daily., Disp: 20 tablet, Rfl: 0   Evolocumab  (REPATHA  SURECLICK) 140 MG/ML SOAJ, INJECT 140 MG INTO THE SKIN EVERY 14 (FOURTEEN) DAYS., Disp: 6 mL, Rfl: 3   ezetimibe  (ZETIA ) 10 MG tablet, Take 1 tablet (10 mg total) by mouth daily., Disp: 90 tablet, Rfl: 1   FLUoxetine  (PROZAC ) 40 MG capsule, Take 2 capsules (80 mg total) by mouth daily., Disp: 180 capsule, Rfl: 1   furosemide  (LASIX ) 20 MG tablet, TAKE 1 TABLET BY MOUTH ONCE DAILY AS NEEDED FOR SWELLING, Disp: 60 tablet, Rfl: 3   hydrOXYzine  (ATARAX ) 25 MG tablet, Take 1 tablet (25 mg total) by mouth 3 (three) times daily as needed., Disp: 270 tablet, Rfl: 1   isosorbide  mononitrate (IMDUR ) 60 MG 24 hr tablet, Take 1 tablet (60 mg total) by mouth daily., Disp: 90 tablet, Rfl: 1   losartan  (COZAAR ) 100 MG tablet, Take 1 tablet (100 mg total) by mouth daily., Disp: 90 tablet, Rfl: 1   nicotine  (NICODERM CQ  - DOSED IN MG/24 HOURS) 21 mg/24hr patch, Place 1 patch (21 mg total) onto the skin daily., Disp: 28 patch, Rfl: 1   ondansetron  (ZOFRAN ) 4 MG tablet, Take 1 tablet (4 mg total) by mouth every 8 (eight) hours as needed for nausea or vomiting., Disp: 60 tablet, Rfl: 3   pantoprazole  (PROTONIX ) 40 MG tablet, Take 1 tablet (40 mg total) by mouth 2 (two) times daily., Disp: 180 tablet, Rfl: 1   prasugrel  (EFFIENT ) 10 MG TABS tablet, Take 1 tablet (10 mg total) by mouth daily., Disp: 30 tablet, Rfl: 0   predniSONE  (DELTASONE ) 10 MG tablet, 6 tabs in the AM days 1 and 2, 5 pills days 3 and 4, decrease by 1 every other day until gone., Disp: 42 tablet, Rfl: 0  QUEtiapine  Fumarate (SEROQUEL  XR) 150 MG 24 hr tablet, TAKE 1 TABLET BY MOUTH AT BEDTIME.,  Disp: 30 tablet, Rfl: 0   ranolazine  (RANEXA ) 1000 MG SR tablet, Take 1 tablet (1,000 mg total) by mouth 2 (two) times daily., Disp: 60 tablet, Rfl: 2   semaglutide -weight management (WEGOVY ) 0.25 MG/0.5ML SOAJ SQ injection, Inject 0.25 mg into the skin once a week., Disp: 2 mL, Rfl: 0   semaglutide -weight management (WEGOVY ) 0.5 MG/0.5ML SOAJ SQ injection, Inject 0.5 mg into the skin once a week., Disp: 2 mL, Rfl: 1   [START ON 07/18/2024] semaglutide -weight management (WEGOVY ) 1 MG/0.5ML SOAJ SQ injection, Inject 1 mg into the skin once a week., Disp: 2 mL, Rfl: 1   tiZANidine  (ZANAFLEX ) 4 MG tablet, Take 1 tablet (4 mg total) by mouth every 8 (eight) hours as needed for muscle spasms., Disp: 90 tablet, Rfl: 1   Vitamin D , Ergocalciferol , (DRISDOL ) 1.25 MG (50000 UNIT) CAPS capsule, TAKE 1 CAPSULE BY MOUTH ONE TIME PER WEEK, Disp: 12 capsule, Rfl: 0 [2]  Social History Tobacco Use  Smoking Status Every Day   Current packs/day: 1.50   Average packs/day: 1.5 packs/day for 41.0 years (61.5 ttl pk-yrs)   Types: Cigarettes  Smokeless Tobacco Never  Tobacco Comments   since age 77

## 2024-06-22 ENCOUNTER — Encounter: Admitting: *Deleted

## 2024-06-22 DIAGNOSIS — Z48812 Encounter for surgical aftercare following surgery on the circulatory system: Secondary | ICD-10-CM | POA: Diagnosis not present

## 2024-06-22 DIAGNOSIS — Z955 Presence of coronary angioplasty implant and graft: Secondary | ICD-10-CM

## 2024-06-22 NOTE — Progress Notes (Signed)
 Daily Session Note  Patient Details  Name: Hannah Kaiser MRN: [id number] Date of Birth: 11-Nov-1960 Referring Provider:   Flowsheet Row Cardiac Rehab from 04/03/2024 in [facility]  Referring Provider Dr. Denyse Bathe, MD    Encounter Date: 06/22/2024  Check In:  Session Check In - 06/22/24 1549       Check-In   Supervising physician immediately available to respond to emergencies See telemetry face sheet for immediately available ER MD    Location [facility]    Staff Present Othel Durand, RN, BSN, CCRP;Laureen Delores, BS, RRT, CPFT;[employer], BS, Exercise Physiologist    Virtual Visit No    Medication changes reported     No    Fall or balance concerns reported    No    Warm-up and Cool-down Performed on first and last piece of equipment    Resistance Training Performed Yes    VAD Patient? No    PAD/SET Patient? No      Pain Assessment   Currently in Pain? No/denies             Tobacco Use History[1]  Goals Met:  Independence with exercise equipment Exercise tolerated well No report of concerns or symptoms today  Goals Unmet:  Not Applicable  Comments: Pt able to follow exercise prescription today without complaint.  Will continue to monitor for progression.    Dr. Oneil Pinal is Medical Director for [facility].  Dr. Fuad Aleskerov is Medical Director for [facility].    [1]  Social History Tobacco Use  Smoking Status Every Day   Current packs/day: 1.50   Average packs/day: 1.5 packs/day for 41.0 years (61.5 ttl pk-yrs)   Types: Cigarettes  Smokeless Tobacco Never  Tobacco Comments   since age 53

## 2024-06-26 ENCOUNTER — Encounter

## 2024-06-27 ENCOUNTER — Encounter: Payer: Self-pay | Admitting: Oncology

## 2024-06-27 ENCOUNTER — Ambulatory Visit: Admitting: Family Medicine

## 2024-06-27 ENCOUNTER — Encounter: Payer: Self-pay | Admitting: Family Medicine

## 2024-06-27 ENCOUNTER — Other Ambulatory Visit: Payer: Self-pay

## 2024-06-27 VITALS — BP 136/83 | HR 79 | Temp 98.4°F | Resp 17 | Ht 64.02 in | Wt 238.2 lb

## 2024-06-27 DIAGNOSIS — I5033 Acute on chronic diastolic (congestive) heart failure: Secondary | ICD-10-CM

## 2024-06-27 DIAGNOSIS — F332 Major depressive disorder, recurrent severe without psychotic features: Secondary | ICD-10-CM | POA: Diagnosis not present

## 2024-06-27 DIAGNOSIS — R5383 Other fatigue: Secondary | ICD-10-CM

## 2024-06-27 DIAGNOSIS — F419 Anxiety disorder, unspecified: Secondary | ICD-10-CM | POA: Diagnosis not present

## 2024-06-27 DIAGNOSIS — D649 Anemia, unspecified: Secondary | ICD-10-CM | POA: Diagnosis not present

## 2024-06-27 MED ORDER — QUETIAPINE FUMARATE ER 150 MG PO TB24: 150.0000 mg | ORAL_TABLET | Freq: Every day | ORAL | 1 refills | Status: AC

## 2024-06-27 MED ORDER — EZETIMIBE 10 MG PO TABS: 10.0000 mg | ORAL_TABLET | Freq: Every day | ORAL | 1 refills | Status: AC

## 2024-06-27 MED ORDER — CLONAZEPAM 1 MG PO TABS: 1.0000 mg | ORAL_TABLET | Freq: Two times a day (BID) | ORAL | 0 refills | Status: AC | PRN

## 2024-06-27 MED ORDER — FLUOXETINE HCL 40 MG PO CAPS: 80.0000 mg | ORAL_CAPSULE | Freq: Every day | ORAL | 1 refills | Status: AC

## 2024-06-27 MED ORDER — REPATHA SURECLICK 140 MG/ML ~~LOC~~ SOAJ
140.0000 mg | SUBCUTANEOUS | 3 refills | Status: AC
  Filled 2024-06-27: qty 6, 84d supply, fill #0

## 2024-06-27 MED ORDER — DAPAGLIFLOZIN PROPANEDIOL 10 MG PO TABS
10.0000 mg | ORAL_TABLET | Freq: Every day | ORAL | 3 refills | Status: AC
  Filled 2024-06-27 – 2024-07-05 (×2): qty 30, 30d supply, fill #0

## 2024-06-27 MED ORDER — HYDROXYZINE HCL 25 MG PO TABS: 25.0000 mg | ORAL_TABLET | Freq: Three times a day (TID) | ORAL | 1 refills | Status: AC | PRN

## 2024-06-27 NOTE — Assessment & Plan Note (Signed)
 Not doing well. Has been in bed x4 days. Feeling worse than she has in a long time. Concern for anemia and fatigue from CHF. Has had bad reactions to remeron, abilify , wellbutrin. Currently on max dose prozac , seroquel  and klonopin . Will refer to psychiatry. Will check labs to look for other causes of fatigue. Recheck in about 6-8 weeks. Call with any concerns.

## 2024-06-27 NOTE — Assessment & Plan Note (Signed)
 Continue to follow with cardiac rehab and cardiology. Has not gotten her farxiga  yet- sent to [facility]. Call with any concerns.

## 2024-06-27 NOTE — Progress Notes (Signed)
 "  BP 136/83 (BP Location: Left Arm, Patient Position: Sitting, Cuff Size: Large)   Pulse 79   Temp 98.4 F (36.9 C) (Oral)   Resp 17   Ht 5' 4.02 (1.626 m)   Wt 238 lb 3.2 oz (108 kg)   SpO2 97%   BMI 40.87 kg/m    Subjective:    Patient ID: Hannah Kaiser, female    DOB: [birth year], 64 y.o.   MRN: [id number]  HPI: Hannah Kaiser is a 64 y.o. female  Chief Complaint  Patient presents with   Follow-up    Believes its a follow up for wegovy ,    Energy    States that she's just out of energy not sure if its because of her bp, her bp was 111/60 something when she checked it. When she checked her sugar it was 84. Not sure if its the meds all labs were low last time she stated but she she feels worn out   OBESITY- has been taking the 0.25 of the wegovy , due to start 0.5mg  this week. Tolerating it well. No concerns.  Duration: chronic Previous attempts at weight loss: yes- portion control, diet, exercise Complications of obesity: IFG, CAD, HTN, HLD, GERD, back pain Peak weight: 254lbs Weight loss goal: to be healthy Weight loss to date: 16lbs Requesting obesity pharmacotherapy: yes Current weight loss supplements/medications: yes Previous weight loss supplements/meds: no  DEPRESSION Mood status: exacerbated Satisfied with current treatment?: no Symptom severity: severe  Duration of current treatment : chronic Side effects: yes Medication compliance: good compliance Psychotherapy/counseling: no  Depressed mood: yes Anxious mood: no Anhedonia: yes Significant weight loss or gain: no Insomnia: no  Fatigue: yes Feelings of worthlessness or guilt: yes Impaired concentration/indecisiveness: yes Suicidal ideations: no Hopelessness: no Crying spells: no    06/27/2024   11:04 AM 05/15/2024    3:39 PM 04/20/2024    3:52 PM 04/11/2024    9:38 AM 04/03/2024   11:37 AM  Depression screen PHQ 2/9  Decreased Interest 3 1 1 2 1   Down, Depressed, Hopeless 2 0 0 1 0  PHQ - 2 Score 5  1 1 3 1   Altered sleeping 3 3 3 1 1   Tired, decreased energy 2 3 3 3 2   Change in appetite 1 0 0 1 1  Feeling bad or failure about yourself  0 0 0 0 0  Trouble concentrating 3 1 1 1 3   Moving slowly or fidgety/restless 1 0 0 0 1  Suicidal thoughts 0 0 0 0 0  PHQ-9 Score 15 8 8 9  9    Difficult doing work/chores Very difficult Somewhat difficult Somewhat difficult Somewhat difficult Somewhat difficult     Data saved with a previous flowsheet row definition    FATIGUE Duration:  chronic Severity: severe  Onset: gradual Context when symptoms started:  none Symptoms improve with rest: no  Depressive symptoms: yes Stress/anxiety: no Insomnia: no  Snoring: yes Observed apnea by bed partner: yes Daytime hypersomnolence:yes Wakes feeling refreshed: no History of sleep study: yes Dysnea on exertion:  yes Orthopnea/PND: no Chest pain: yes Chronic cough: no Lower extremity edema: yes Arthralgias:no Myalgias: no Weakness: no Rash: no   Relevant past medical, surgical, family and social history reviewed and updated as indicated. Interim medical history since our last visit reviewed. Allergies and medications reviewed and updated.  Review of Systems  Constitutional:  Positive for fatigue. Negative for activity change, appetite change, chills, diaphoresis, fever and unexpected weight change.  Respiratory: Negative.    Cardiovascular: Negative.   Musculoskeletal: Negative.   Skin: Negative.   Neurological: Negative.   Psychiatric/Behavioral:  Positive for dysphoric mood. Negative for agitation, behavioral problems, confusion, decreased concentration, hallucinations, self-injury, sleep disturbance and suicidal ideas. The patient is not nervous/anxious and is not hyperactive.     Per HPI unless specifically indicated above     Objective:    BP 136/83 (BP Location: Left Arm, Patient Position: Sitting, Cuff Size: Large)   Pulse 79   Temp 98.4 F (36.9 C) (Oral)   Resp 17    Ht 5' 4.02 (1.626 m)   Wt 238 lb 3.2 oz (108 kg)   SpO2 97%   BMI 40.87 kg/m   Wt Readings from Last 3 Encounters:  06/27/24 238 lb 3.2 oz (108 kg)  05/30/24 242 lb 3.2 oz (109.9 kg)  05/23/24 240 lb (108.9 kg)    Physical Exam Vitals and nursing note reviewed.  Constitutional:      General: She is not in acute distress.    Appearance: Normal appearance. She is obese. She is not ill-appearing, toxic-appearing or diaphoretic.  HENT:     Head: Normocephalic and atraumatic.     Right Ear: External ear normal.     Left Ear: External ear normal.     Nose: Nose normal.     Mouth/Throat:     Mouth: Mucous membranes are moist.     Pharynx: Oropharynx is clear.  Eyes:     General: No scleral icterus.       Right eye: No discharge.        Left eye: No discharge.     Extraocular Movements: Extraocular movements intact.     Conjunctiva/sclera: Conjunctivae normal.     Pupils: Pupils are equal, round, and reactive to light.  Cardiovascular:     Rate and Rhythm: Normal rate and regular rhythm.     Pulses: Normal pulses.     Heart sounds: Normal heart sounds. No murmur heard.    No friction rub. No gallop.  Pulmonary:     Effort: Pulmonary effort is normal. No respiratory distress.     Breath sounds: Normal breath sounds. No stridor. No wheezing, rhonchi or rales.  Chest:     Chest wall: No tenderness.  Musculoskeletal:        General: Normal range of motion.     Cervical back: Normal range of motion and neck supple.  Skin:    General: Skin is warm and dry.     Capillary Refill: Capillary refill takes less than 2 seconds.     Coloration: Skin is not jaundiced or pale.     Findings: No bruising, erythema, lesion or rash.  Neurological:     General: No focal deficit present.     Mental Status: She is alert and oriented to person, place, and time. Mental status is at baseline.  Psychiatric:        Mood and Affect: Mood normal.        Behavior: Behavior normal.        Thought  Content: Thought content normal.        Judgment: Judgment normal.     Results for orders placed or performed in visit on 05/23/24  CBC with Differential/Platelet   Collection Time: 05/23/24 11:53 AM  Result Value Ref Range   WBC 7.3 3.4 - 10.8 x10E3/uL   RBC 3.33 (L) 3.77 - 5.28 x10E6/uL   Hemoglobin 9.5 (L) 11.1 - 15.9 g/dL  Hematocrit 29.6 (L) 34.0 - 46.6 %   MCV 89 79 - 97 fL   MCH 28.5 26.6 - 33.0 pg   MCHC 32.1 31.5 - 35.7 g/dL   RDW 86.9 88.2 - 84.5 %   Platelets 310 150 - 450 x10E3/uL   Neutrophils 67 Not Estab. %   Lymphs 22 Not Estab. %   Monocytes 5 Not Estab. %   Eos 3 Not Estab. %   Basos 1 Not Estab. %   Neutrophils Absolute 4.9 1.4 - 7.0 x10E3/uL   Lymphocytes Absolute 1.6 0.7 - 3.1 x10E3/uL   Monocytes Absolute 0.4 0.1 - 0.9 x10E3/uL   EOS (ABSOLUTE) 0.2 0.0 - 0.4 x10E3/uL   Basophils Absolute 0.0 0.0 - 0.2 x10E3/uL   Immature Granulocytes 2 Not Estab. %   Immature Grans (Abs) 0.1 0.0 - 0.1 x10E3/uL  Comprehensive metabolic panel with GFR   Collection Time: 05/23/24 11:53 AM  Result Value Ref Range   Glucose 112 (H) 70 - 99 mg/dL   BUN 19 8 - 27 mg/dL   Creatinine, Ser 9.00 0.57 - 1.00 mg/dL   eGFR 65 >40 fO/fpw/8.26   BUN/Creatinine Ratio 19 12 - 28   Sodium 141 134 - 144 mmol/L   Potassium 4.1 3.5 - 5.2 mmol/L   Chloride 105 96 - 106 mmol/L   CO2 20 20 - 29 mmol/L   Calcium  9.0 8.7 - 10.3 mg/dL   Total Protein 6.3 6.0 - 8.5 g/dL   Albumin 4.2 3.9 - 4.9 g/dL   Globulin, Total 2.1 1.5 - 4.5 g/dL   Bilirubin Total 0.3 0.0 - 1.2 mg/dL   Alkaline Phosphatase 65 49 - 135 IU/L   AST 16 0 - 40 IU/L   ALT 12 0 - 32 IU/L  Lipid Panel w/o Chol/HDL Ratio   Collection Time: 05/23/24 11:53 AM  Result Value Ref Range   Cholesterol, Total 138 100 - 199 mg/dL   Triglycerides 711 (H) 0 - 149 mg/dL   HDL 32 (L) >60 mg/dL   VLDL Cholesterol Cal 46 (H) 5 - 40 mg/dL   LDL Chol Calc (NIH) 60 0 - 99 mg/dL      Assessment & Plan:   Problem List Items Addressed  This Visit       Cardiovascular and Mediastinum   CHF (congestive heart failure), NYHA class III, acute on chronic, diastolic (HCC)   Continue to follow with cardiac rehab and cardiology. Has not gotten her farxiga  yet- sent to [facility]. Call with any concerns.       Relevant Medications   dapagliflozin  propanediol (FARXIGA ) 10 MG TABS tablet   Evolocumab  (REPATHA  SURECLICK) 140 MG/ML SOAJ   ezetimibe  (ZETIA ) 10 MG tablet   Other Relevant Orders   Basic metabolic panel with GFR   Ambulatory referral to Psychiatry     Other   Severe episode of recurrent major depressive disorder, without psychotic features (HCC)   Not doing well. Has been in bed x4 days. Feeling worse than she has in a long time. Concern for anemia and fatigue from CHF. Has had bad reactions to remeron, abilify , wellbutrin. Currently on max dose prozac , seroquel  and klonopin . Will refer to psychiatry. Will check labs to look for other causes of fatigue. Recheck in about 6-8 weeks. Call with any concerns.       Relevant Medications   FLUoxetine  (PROZAC ) 40 MG capsule   hydrOXYzine  (ATARAX ) 25 MG tablet   Other Relevant Orders   Ambulatory referral to Psychiatry  Anxiety   Not doing well. Has been in bed x4 days. Feeling worse than she has in a long time. Concern for anemia and fatigue from CHF. Has had bad reactions to remeron, abilify , wellbutrin. Currently on max dose prozac , seroquel  and klonopin . Will refer to psychiatry. Will check labs to look for other causes of fatigue. Recheck in about 6-8 weeks. Call with any concerns.       Relevant Medications   FLUoxetine  (PROZAC ) 40 MG capsule   hydrOXYzine  (ATARAX ) 25 MG tablet   Other Relevant Orders   Ambulatory referral to Psychiatry   Morbid obesity (HCC)   Tolerating her wegovy  well without issues. Down 16lbs for 12.5% of her body weight. Continue titration up. Call with any concerns.       Relevant Medications   dapagliflozin  propanediol (FARXIGA ) 10 MG TABS  tablet   Other Visit Diagnoses       Other fatigue    -  Primary   Likely multifactorial. Will check labs. Will refer to psychiatry to see if they can help. Continue cardiac rehab. Call with any concerns.   Relevant Orders   CBC with Differential/Platelet   VITAMIN D  25 Hydroxy (Vit-D Deficiency, Fractures)     Anemia, unspecified type       Rechecking labs today. Await results. Treat as needed.   Relevant Orders   Ferritin   Iron  Binding Cap (TIBC)([facility])   Fecal occult blood, imunochemical        Follow up plan: Return for 6-8 weeks.      "

## 2024-06-27 NOTE — Assessment & Plan Note (Signed)
 Tolerating her wegovy  well without issues. Down 16lbs for 12.5% of her body weight. Continue titration up. Call with any concerns.

## 2024-06-28 ENCOUNTER — Other Ambulatory Visit (HOSPITAL_COMMUNITY): Payer: Self-pay

## 2024-06-28 ENCOUNTER — Telehealth: Payer: Self-pay

## 2024-06-28 LAB — BASIC METABOLIC PANEL WITH GFR
BUN/Creatinine Ratio: 18 (ref 12–28)
BUN: 15 mg/dL (ref 8–27)
CO2: 20 mmol/L (ref 20–29)
Calcium: 9.5 mg/dL (ref 8.7–10.3)
Chloride: 108 mmol/L — ABNORMAL HIGH (ref 96–106)
Creatinine, Ser: 0.85 mg/dL (ref 0.57–1.00)
Glucose: 87 mg/dL (ref 70–99)
Potassium: 4.5 mmol/L (ref 3.5–5.2)
Sodium: 143 mmol/L (ref 134–144)
eGFR: 78 mL/min/1.73

## 2024-06-28 LAB — CBC WITH DIFFERENTIAL/PLATELET
Basophils Absolute: 0 x10E3/uL (ref 0.0–0.2)
Basos: 0 %
EOS (ABSOLUTE): 0.2 x10E3/uL (ref 0.0–0.4)
Eos: 3 %
Hematocrit: 30.8 % — ABNORMAL LOW (ref 34.0–46.6)
Hemoglobin: 9.5 g/dL — ABNORMAL LOW (ref 11.1–15.9)
Immature Grans (Abs): 0 x10E3/uL (ref 0.0–0.1)
Immature Granulocytes: 0 %
Lymphocytes Absolute: 1.9 x10E3/uL (ref 0.7–3.1)
Lymphs: 34 %
MCH: 26.4 pg — ABNORMAL LOW (ref 26.6–33.0)
MCHC: 30.8 g/dL — ABNORMAL LOW (ref 31.5–35.7)
MCV: 86 fL (ref 79–97)
Monocytes Absolute: 0.4 x10E3/uL (ref 0.1–0.9)
Monocytes: 7 %
Neutrophils Absolute: 3.1 x10E3/uL (ref 1.4–7.0)
Neutrophils: 56 %
Platelets: 329 x10E3/uL (ref 150–450)
RBC: 3.6 x10E6/uL — ABNORMAL LOW (ref 3.77–5.28)
RDW: 13.4 % (ref 11.7–15.4)
WBC: 5.6 x10E3/uL (ref 3.4–10.8)

## 2024-06-28 LAB — VITAMIN D 25 HYDROXY (VIT D DEFICIENCY, FRACTURES): Vit D, 25-Hydroxy: 19.1 ng/mL — ABNORMAL LOW (ref 30.0–100.0)

## 2024-06-28 LAB — IRON AND TIBC
Iron Saturation: 6 % — CL (ref 15–55)
Iron: 21 ug/dL — ABNORMAL LOW (ref 27–139)
Total Iron Binding Capacity: 370 ug/dL (ref 250–450)
UIBC: 349 ug/dL (ref 118–369)

## 2024-06-28 LAB — FERRITIN: Ferritin: 11 ng/mL — ABNORMAL LOW (ref 15–150)

## 2024-06-28 NOTE — Telephone Encounter (Signed)
 Pharmacy Patient Advocate Encounter  Received notification from [facility] [employer] that Prior Authorization for Dapagliflozin  Propanediol 10MG  tablets  has been APPROVED from 06/28/24 to 06/28/25. Ran test claim, Copay is $4.00. This test claim was processed through [facility]- copay amounts may vary at other pharmacies due to pharmacy/plan contracts, or as the patient moves through the different stages of their insurance plan.   PA #/Case ID/Reference #: [id number]

## 2024-06-28 NOTE — Telephone Encounter (Signed)
 Pharmacy Patient Advocate Encounter   Received notification from [employer] that prior authorization for Dapagliflozin  Propanediol 10MG  tablets is required/requested.   Insurance verification completed.   The patient is insured through [employer].   Per test claim: PA required; PA submitted to above mentioned insurance via Latent Key/confirmation #/EOC [id number] Status is pending

## 2024-06-29 ENCOUNTER — Encounter

## 2024-06-30 ENCOUNTER — Other Ambulatory Visit (HOSPITAL_COMMUNITY): Payer: Self-pay

## 2024-07-01 ENCOUNTER — Ambulatory Visit: Payer: Self-pay | Admitting: Family Medicine

## 2024-07-01 MED ORDER — VITAMIN D (ERGOCALCIFEROL) 1.25 MG (50000 UNIT) PO CAPS: 50000.0000 [IU] | ORAL_CAPSULE | ORAL | 1 refills | Status: AC

## 2024-07-01 MED ORDER — IRON (FERROUS SULFATE) 325 (65 FE) MG PO TABS: 325.0000 mg | ORAL_TABLET | Freq: Every day | ORAL | 2 refills | Status: AC

## 2024-07-03 ENCOUNTER — Encounter

## 2024-07-05 ENCOUNTER — Other Ambulatory Visit: Payer: Self-pay

## 2024-07-06 ENCOUNTER — Encounter

## 2024-07-10 ENCOUNTER — Encounter

## 2024-07-13 ENCOUNTER — Encounter

## 2024-07-17 ENCOUNTER — Encounter

## 2024-07-20 ENCOUNTER — Encounter

## 2024-07-24 ENCOUNTER — Encounter

## 2024-07-27 ENCOUNTER — Encounter

## 2024-07-31 ENCOUNTER — Encounter

## 2024-08-01 ENCOUNTER — Ambulatory Visit: Admitting: Cardiovascular Disease

## 2024-08-03 ENCOUNTER — Encounter

## 2024-08-07 ENCOUNTER — Encounter

## 2024-08-10 ENCOUNTER — Encounter

## 2024-08-14 ENCOUNTER — Encounter

## 2024-08-14 ENCOUNTER — Ambulatory Visit: Admitting: Family Medicine

## 2024-08-17 ENCOUNTER — Encounter
